# Patient Record
Sex: Male | Born: 1945 | Race: White | Hispanic: No | Marital: Married | Smoking: Current every day smoker
Health system: Southern US, Community
[De-identification: ages and names within clinical notes are randomized; demographics above are authoritative.]

## PROBLEM LIST (undated history)

## (undated) DIAGNOSIS — Z860100 Personal history of colon polyps, unspecified: Secondary | ICD-10-CM

## (undated) DIAGNOSIS — E785 Hyperlipidemia, unspecified: Secondary | ICD-10-CM

## (undated) DIAGNOSIS — Z87442 Personal history of urinary calculi: Secondary | ICD-10-CM

## (undated) DIAGNOSIS — G4700 Insomnia, unspecified: Secondary | ICD-10-CM

## (undated) DIAGNOSIS — G8929 Other chronic pain: Secondary | ICD-10-CM

## (undated) DIAGNOSIS — K219 Gastro-esophageal reflux disease without esophagitis: Secondary | ICD-10-CM

## (undated) DIAGNOSIS — K5790 Diverticulosis of intestine, part unspecified, without perforation or abscess without bleeding: Secondary | ICD-10-CM

## (undated) DIAGNOSIS — M2550 Pain in unspecified joint: Secondary | ICD-10-CM

## (undated) DIAGNOSIS — N200 Calculus of kidney: Secondary | ICD-10-CM

## (undated) DIAGNOSIS — F0390 Unspecified dementia without behavioral disturbance: Secondary | ICD-10-CM

## (undated) DIAGNOSIS — D649 Anemia, unspecified: Secondary | ICD-10-CM

## (undated) DIAGNOSIS — K2270 Barrett's esophagus without dysplasia: Secondary | ICD-10-CM

## (undated) DIAGNOSIS — Z8669 Personal history of other diseases of the nervous system and sense organs: Secondary | ICD-10-CM

## (undated) DIAGNOSIS — M170 Bilateral primary osteoarthritis of knee: Secondary | ICD-10-CM

## (undated) DIAGNOSIS — M549 Dorsalgia, unspecified: Secondary | ICD-10-CM

## (undated) DIAGNOSIS — R7303 Prediabetes: Secondary | ICD-10-CM

## (undated) DIAGNOSIS — R519 Headache, unspecified: Secondary | ICD-10-CM

## (undated) DIAGNOSIS — F419 Anxiety disorder, unspecified: Secondary | ICD-10-CM

## (undated) DIAGNOSIS — I10 Essential (primary) hypertension: Secondary | ICD-10-CM

## (undated) DIAGNOSIS — I639 Cerebral infarction, unspecified: Secondary | ICD-10-CM

## (undated) DIAGNOSIS — M1990 Unspecified osteoarthritis, unspecified site: Secondary | ICD-10-CM

## (undated) DIAGNOSIS — S92511A Displaced fracture of proximal phalanx of right lesser toe(s), initial encounter for closed fracture: Secondary | ICD-10-CM

## (undated) HISTORY — PX: COLONOSCOPY: SHX174

## (undated) HISTORY — PX: ESOPHAGOGASTRODUODENOSCOPY: SHX1529

---
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
## 1971-12-18 HISTORY — PX: VASECTOMY: SHX75

## 1991-12-18 HISTORY — PX: ARTHROSCOPIC REPAIR ACL: SUR80

## 1995-12-18 HISTORY — PX: ANKLE SURGERY: SHX546

## 2005-01-12 ENCOUNTER — Emergency Department: Payer: Self-pay | Admitting: Emergency Medicine

## 2005-06-13 ENCOUNTER — Ambulatory Visit: Payer: Self-pay

## 2005-07-20 ENCOUNTER — Ambulatory Visit: Payer: Self-pay | Admitting: Otolaryngology

## 2009-03-03 ENCOUNTER — Emergency Department: Payer: Self-pay | Admitting: Emergency Medicine

## 2009-03-06 ENCOUNTER — Emergency Department: Payer: Self-pay | Admitting: Emergency Medicine

## 2009-06-23 ENCOUNTER — Ambulatory Visit: Payer: Self-pay | Admitting: Urology

## 2010-02-10 ENCOUNTER — Ambulatory Visit: Payer: Self-pay | Admitting: Urology

## 2011-06-04 ENCOUNTER — Ambulatory Visit: Payer: Self-pay | Admitting: Gastroenterology

## 2011-06-06 LAB — PATHOLOGY REPORT

## 2011-07-30 ENCOUNTER — Ambulatory Visit: Payer: Self-pay | Admitting: Gastroenterology

## 2011-07-31 LAB — PATHOLOGY REPORT

## 2011-10-11 ENCOUNTER — Emergency Department: Payer: Self-pay | Admitting: Emergency Medicine

## 2013-03-28 ENCOUNTER — Inpatient Hospital Stay: Payer: Self-pay | Admitting: Surgery

## 2013-03-28 LAB — URINALYSIS, COMPLETE
Glucose,UR: NEGATIVE mg/dL (ref 0–75)
Nitrite: NEGATIVE
Ph: 5 (ref 4.5–8.0)
RBC,UR: 2 /HPF (ref 0–5)
Specific Gravity: 1.026 (ref 1.003–1.030)

## 2013-03-28 LAB — CBC
HCT: 40.7 % (ref 40.0–52.0)
MCH: 30.6 pg (ref 26.0–34.0)
MCV: 91 fL (ref 80–100)
RDW: 13.6 % (ref 11.5–14.5)

## 2013-03-28 LAB — COMPREHENSIVE METABOLIC PANEL
Albumin: 3.5 g/dL (ref 3.4–5.0)
Alkaline Phosphatase: 94 U/L (ref 50–136)
Bilirubin,Total: 0.7 mg/dL (ref 0.2–1.0)
Calcium, Total: 9.4 mg/dL (ref 8.5–10.1)
Chloride: 103 mmol/L (ref 98–107)
Co2: 26 mmol/L (ref 21–32)
Creatinine: 1.14 mg/dL (ref 0.60–1.30)
Glucose: 118 mg/dL — ABNORMAL HIGH (ref 65–99)
Osmolality: 275 (ref 275–301)
Potassium: 3.8 mmol/L (ref 3.5–5.1)
SGOT(AST): 13 U/L — ABNORMAL LOW (ref 15–37)
SGPT (ALT): 17 U/L (ref 12–78)
Sodium: 136 mmol/L (ref 136–145)
Total Protein: 6.8 g/dL (ref 6.4–8.2)

## 2013-03-29 LAB — CBC WITH DIFFERENTIAL/PLATELET
Basophil #: 0.2 10*3/uL — ABNORMAL HIGH (ref 0.0–0.1)
Basophil %: 1.2 %
Eosinophil #: 0.2 10*3/uL (ref 0.0–0.7)
Eosinophil %: 1.6 %
HCT: 35.9 % — ABNORMAL LOW (ref 40.0–52.0)
HGB: 12.1 g/dL — ABNORMAL LOW (ref 13.0–18.0)
Lymphocyte #: 3.5 10*3/uL (ref 1.0–3.6)
MCH: 30.7 pg (ref 26.0–34.0)
MCHC: 33.7 g/dL (ref 32.0–36.0)
Neutrophil %: 61.8 %
RBC: 3.95 10*6/uL — ABNORMAL LOW (ref 4.40–5.90)
WBC: 13 10*3/uL — ABNORMAL HIGH (ref 3.8–10.6)

## 2013-03-29 LAB — COMPREHENSIVE METABOLIC PANEL
Albumin: 3.1 g/dL — ABNORMAL LOW (ref 3.4–5.0)
Anion Gap: 5 — ABNORMAL LOW (ref 7–16)
BUN: 15 mg/dL (ref 7–18)
Calcium, Total: 8.7 mg/dL (ref 8.5–10.1)
Co2: 27 mmol/L (ref 21–32)
EGFR (African American): >60
EGFR (Non-African Amer.): >60
Potassium: 3.6 mmol/L (ref 3.5–5.1)
SGOT(AST): 12 U/L — ABNORMAL LOW (ref 15–37)
SGPT (ALT): 14 U/L (ref 12–78)

## 2013-03-30 LAB — CBC WITH DIFFERENTIAL/PLATELET
Eosinophil %: 2.7 %
HCT: 32.5 % — ABNORMAL LOW (ref 40.0–52.0)
HGB: 11.1 g/dL — ABNORMAL LOW (ref 13.0–18.0)
Lymphocyte %: 35.1 %
MCHC: 34.1 g/dL (ref 32.0–36.0)
Monocyte %: 9 %
Neutrophil %: 52.7 %
Platelet: 190 10*3/uL (ref 150–440)
RBC: 3.57 10*6/uL — ABNORMAL LOW (ref 4.40–5.90)
WBC: 8.2 10*3/uL (ref 3.8–10.6)

## 2013-04-30 ENCOUNTER — Ambulatory Visit: Payer: Self-pay | Admitting: Gastroenterology

## 2013-05-01 LAB — PATHOLOGY REPORT

## 2014-04-18 IMAGING — CT CT ABD-PELV W/ CM
1 of 2 series · 15 of 32 positions shown, 19 images · IV contrast (isovue)
Comparison: none

REASON FOR EXAM: (1) RLQ pain; (2) RLQ pain
COMMENTS:

PROCEDURE:     CT  - CT ABDOMEN / PELVIS  W  - March 28, 2013  [DATE]
RESULT:     Comparison:  02/10/2010
TECHNIQUE: Multiple axial images of the abdomen and pelvis were performed
from the lung bases to the pubic symphysis, without p.o. contrast and with
100 mL of Isovue 370 intravenous contrast.

[Series 2: 3mm soft tissue · axial · 0.75mm/px · z∈[-1261,-844]mm · 15 of 153 slices shown, 19 images]
[im 7/153  soft-tissue]
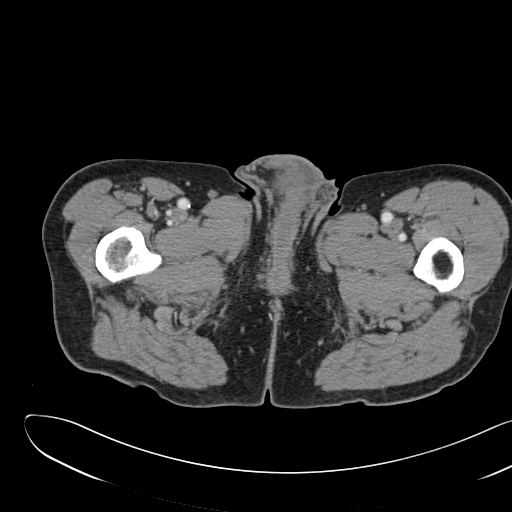
[im 7/153  bone]
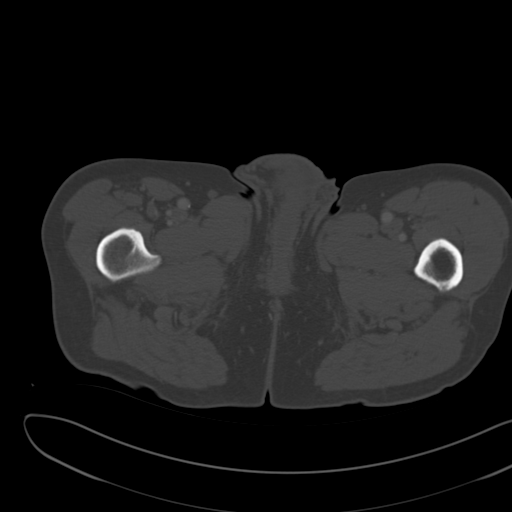
[im 19/153  soft-tissue]
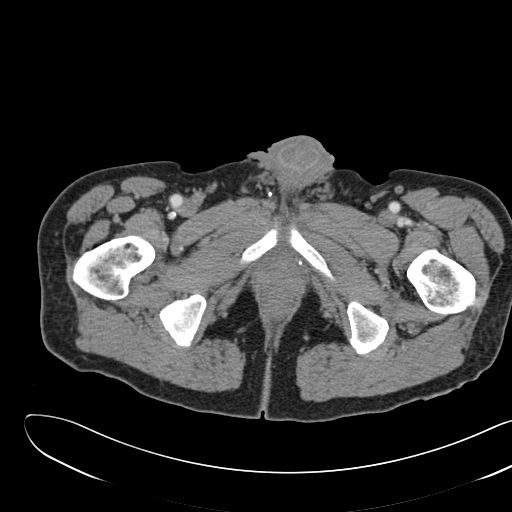
[im 31/153  soft-tissue]
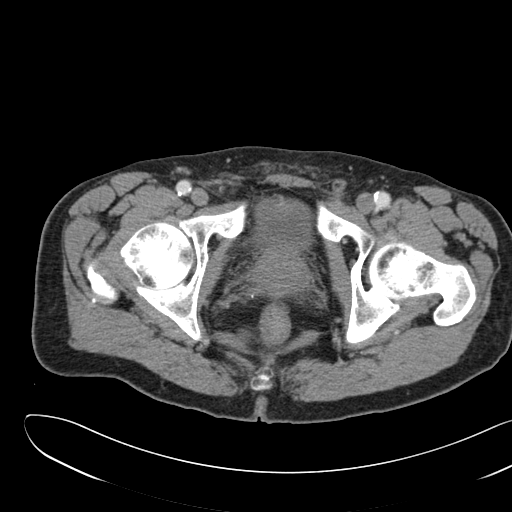
[im 43/153  soft-tissue]
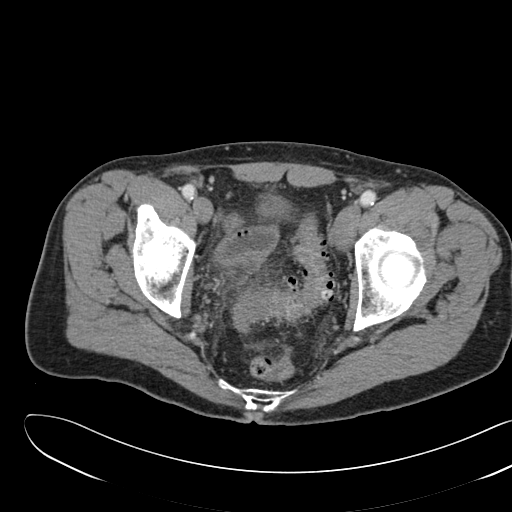
[im 55/153  soft-tissue]
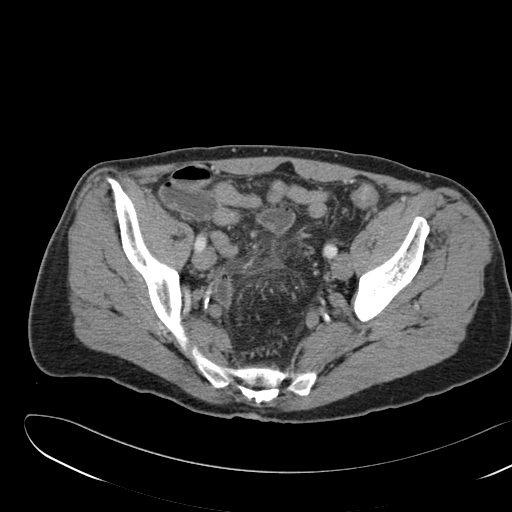
[im 67/153  soft-tissue]
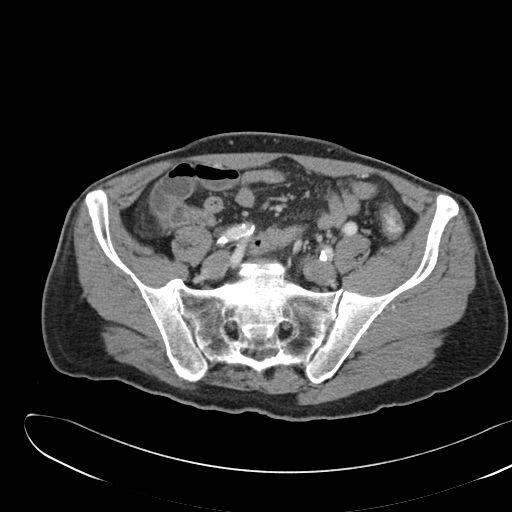
[im 80/153  soft-tissue]
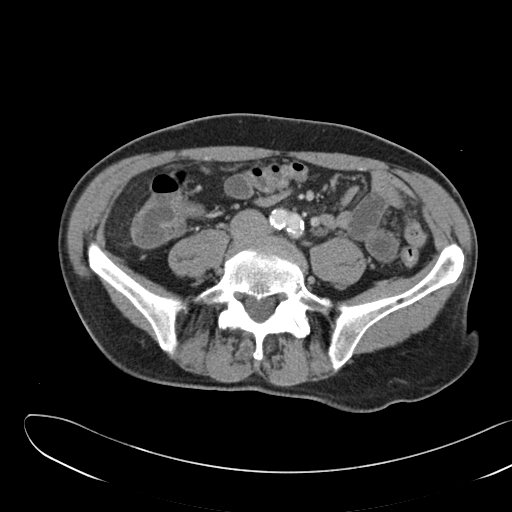
[im 86/153  soft-tissue]
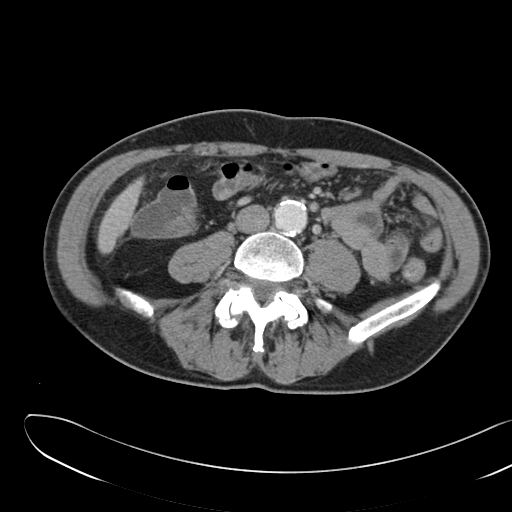
[im 98/153  soft-tissue]
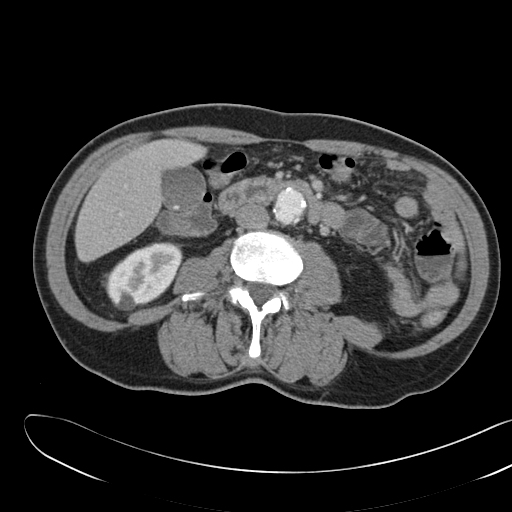
[im 98/153  bone]
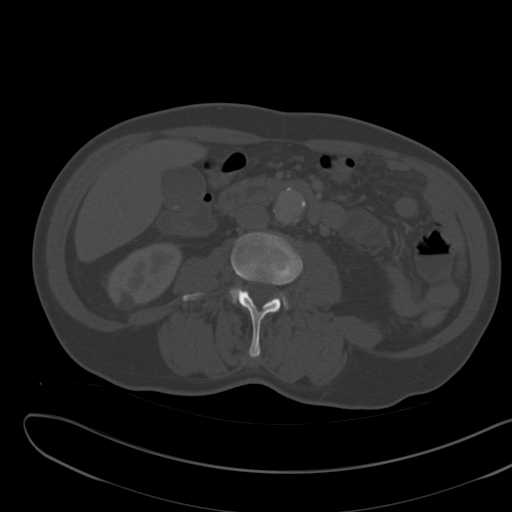
[im 110/153  soft-tissue]
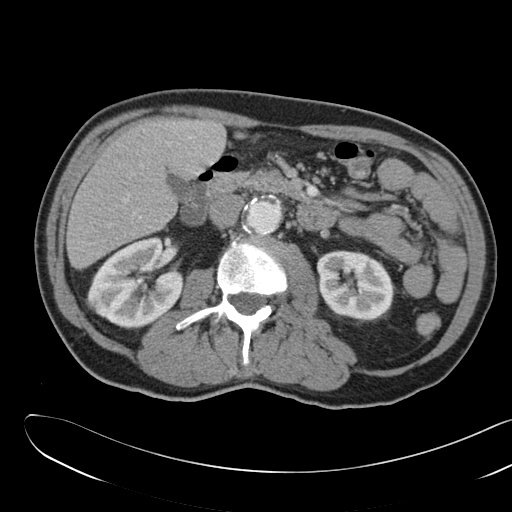
[im 122/153  soft-tissue]
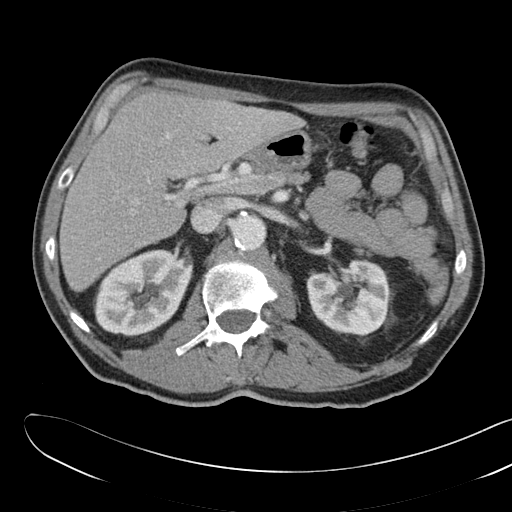
[im 128/153  lung]
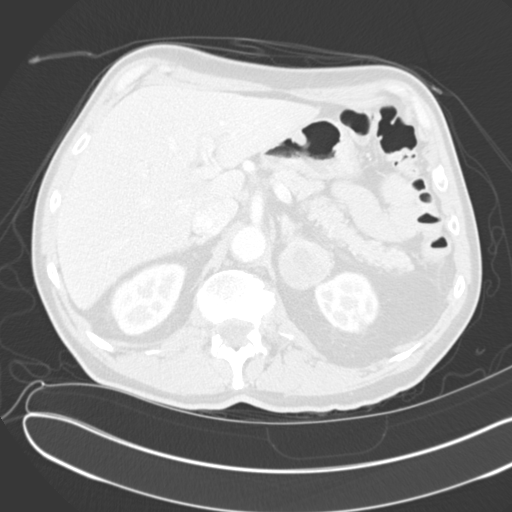
[im 134/153  soft-tissue]
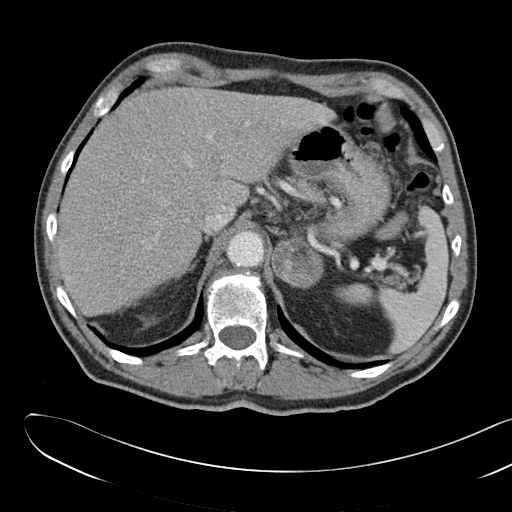
[im 134/153  lung]
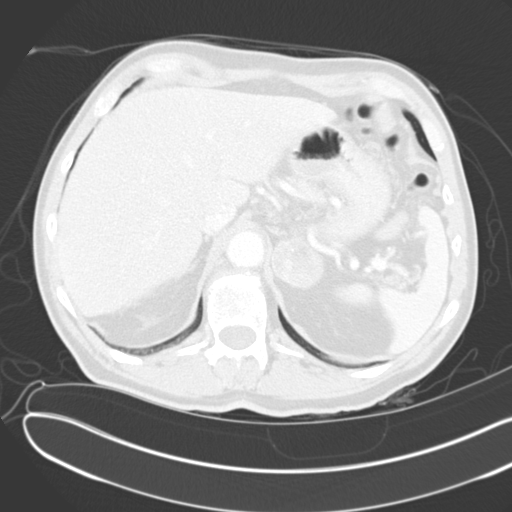
[im 140/153  lung]
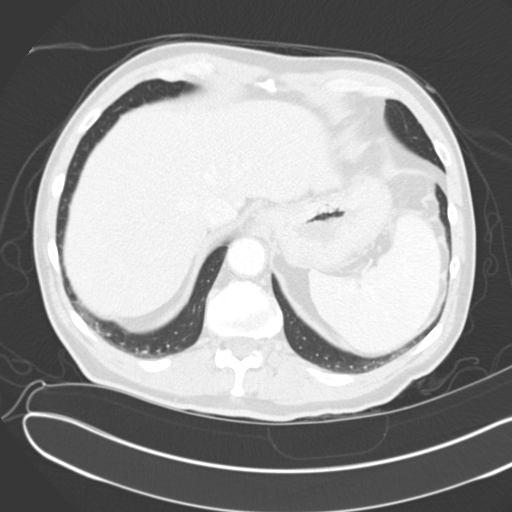
[im 146/153  soft-tissue]
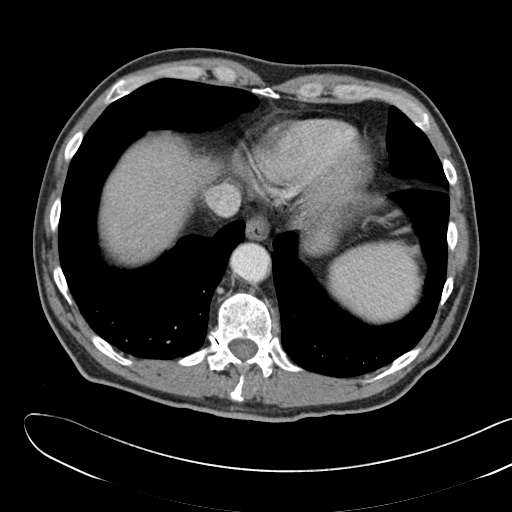
[im 146/153  lung]
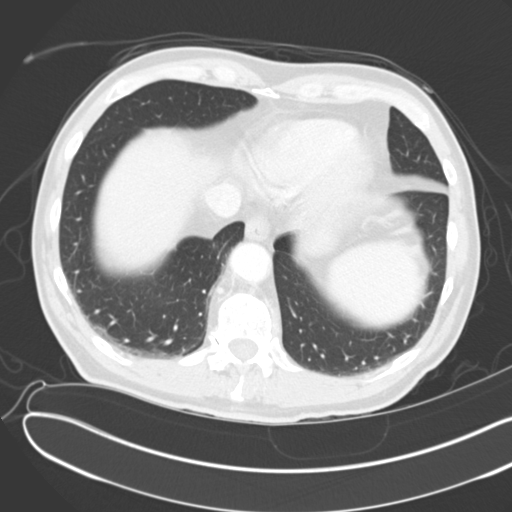

[15 of 32 positions shown; findings below may reference images not displayed]

FINDINGS: Mild basilar opacities are likely secondary to atelectasis.

The liver, spleen, and pancreas are unremarkable. Calcified stones are seen
within the gallbladder. Small low-attenuation foci in the kidneys are too
small to characterize, but likely represents cyst. There is a 4.1 cm mass in
the left adrenal gland which is similar to prior.

There is ectasia of the infrarenal abdominal aorta.

There is diverticulosis of the sigmoid colon. There is focal bowel wall
thickening and inflammatory change involving the inferior aspect of the
sigmoid colon. There are a few foci of adjacent extraluminal air which are
concerning for microperforation. No discrete extraluminal fluid collection
identified. There is diverticulosis of the descending colon. The appendix is
normal. There is mild prominence of the bladder wall, which is nonspecific.
Correlate for cystitis.

No aggressive lytic or sclerotic osseous lesions are identified.
IMPRESSION: Findings concerning for acute sigmoid diverticulitis. There are a few small
foci of extraluminal air concerning for microperforation. No discrete fluid
collection seen. Followup colonoscopy is suggested after the acute episode,
as malignancy can have a similar appearance.

## 2014-08-20 ENCOUNTER — Emergency Department: Payer: Self-pay | Admitting: Student

## 2014-08-20 LAB — COMPREHENSIVE METABOLIC PANEL
Albumin: 3.9 g/dL (ref 3.4–5.0)
Alkaline Phosphatase: 93 U/L
Anion Gap: 7 (ref 7–16)
BUN: 14 mg/dL (ref 7–18)
Bilirubin,Total: 0.2 mg/dL (ref 0.2–1.0)
Calcium, Total: 9.7 mg/dL (ref 8.5–10.1)
Chloride: 108 mmol/L — ABNORMAL HIGH (ref 98–107)
Co2: 24 mmol/L (ref 21–32)
Creatinine: 0.97 mg/dL (ref 0.60–1.30)
EGFR (African American): >60
EGFR (Non-African Amer.): >60
Glucose: 90 mg/dL (ref 65–99)
Osmolality: 278 (ref 275–301)
Potassium: 3.6 mmol/L (ref 3.5–5.1)
SGOT(AST): 13 U/L — ABNORMAL LOW (ref 15–37)
SGPT (ALT): 22 U/L
Sodium: 139 mmol/L (ref 136–145)
Total Protein: 7.1 g/dL (ref 6.4–8.2)

## 2014-08-20 LAB — URINALYSIS, COMPLETE
Bilirubin,UR: NEGATIVE
Glucose,UR: NEGATIVE mg/dL (ref 0–75)
Ketone: NEGATIVE
LEUKOCYTE ESTERASE: NEGATIVE
Nitrite: NEGATIVE
PH: 6 (ref 4.5–8.0)
PROTEIN: NEGATIVE
SPECIFIC GRAVITY: 1.004 (ref 1.003–1.030)
Squamous Epithelial: NONE SEEN
WBC UR: 1 /HPF (ref 0–5)

## 2014-08-20 LAB — CBC WITH DIFFERENTIAL/PLATELET
Eosinophil: 2 %
HCT: 41.7 % (ref 40.0–52.0)
HGB: 13.8 g/dL (ref 13.0–18.0)
Lymphocytes: 50 %
MCH: 30.7 pg (ref 26.0–34.0)
MCHC: 33 g/dL (ref 32.0–36.0)
MCV: 93 fL (ref 80–100)
MONOS PCT: 3 %
PLATELETS: 243 10*3/uL (ref 150–440)
RBC: 4.48 10*6/uL (ref 4.40–5.90)
RDW: 14 % (ref 11.5–14.5)
SEGMENTED NEUTROPHILS: 39 %
Variant Lymphocyte - H1-Rlymph: 6 %
WBC: 10 10*3/uL (ref 3.8–10.6)

## 2014-08-20 LAB — LIPASE, BLOOD: Lipase: 209 U/L (ref 73–393)

## 2014-08-31 ENCOUNTER — Emergency Department: Payer: Self-pay | Admitting: Emergency Medicine

## 2014-08-31 LAB — URINALYSIS, COMPLETE
BACTERIA: NONE SEEN
BILIRUBIN, UR: NEGATIVE
Glucose,UR: NEGATIVE mg/dL (ref 0–75)
Ketone: NEGATIVE
LEUKOCYTE ESTERASE: NEGATIVE
NITRITE: NEGATIVE
PROTEIN: NEGATIVE
Ph: 5 (ref 4.5–8.0)
RBC,UR: 2 /HPF (ref 0–5)
SQUAMOUS EPITHELIAL: NONE SEEN
Specific Gravity: 1.008 (ref 1.003–1.030)

## 2014-08-31 LAB — COMPREHENSIVE METABOLIC PANEL
ALBUMIN: 4 g/dL (ref 3.4–5.0)
ALK PHOS: 87 U/L
AST: 11 U/L — AB (ref 15–37)
Anion Gap: 3 — ABNORMAL LOW (ref 7–16)
BUN: 21 mg/dL — ABNORMAL HIGH (ref 7–18)
Bilirubin,Total: 0.4 mg/dL (ref 0.2–1.0)
CALCIUM: 9.4 mg/dL (ref 8.5–10.1)
CREATININE: 1.05 mg/dL (ref 0.60–1.30)
Chloride: 107 mmol/L (ref 98–107)
Co2: 29 mmol/L (ref 21–32)
Glucose: 107 mg/dL — ABNORMAL HIGH (ref 65–99)
Osmolality: 281 (ref 275–301)
POTASSIUM: 3.6 mmol/L (ref 3.5–5.1)
SGPT (ALT): 19 U/L
SODIUM: 139 mmol/L (ref 136–145)
TOTAL PROTEIN: 7.2 g/dL (ref 6.4–8.2)

## 2014-08-31 LAB — CBC
HCT: 41.2 % (ref 40.0–52.0)
HGB: 13.8 g/dL (ref 13.0–18.0)
MCH: 30.9 pg (ref 26.0–34.0)
MCHC: 33.4 g/dL (ref 32.0–36.0)
MCV: 93 fL (ref 80–100)
Platelet: 243 10*3/uL (ref 150–440)
RBC: 4.45 10*6/uL (ref 4.40–5.90)
RDW: 13.5 % (ref 11.5–14.5)
WBC: 9.7 10*3/uL (ref 3.8–10.6)

## 2014-09-05 ENCOUNTER — Emergency Department: Payer: Self-pay | Admitting: Internal Medicine

## 2014-09-05 LAB — COMPREHENSIVE METABOLIC PANEL
Albumin: 3.5 g/dL (ref 3.4–5.0)
Alkaline Phosphatase: 81 U/L
Anion Gap: 7 (ref 7–16)
BUN: 23 mg/dL — AB (ref 7–18)
Bilirubin,Total: 0.2 mg/dL (ref 0.2–1.0)
CO2: 26 mmol/L (ref 21–32)
Calcium, Total: 9.9 mg/dL (ref 8.5–10.1)
Chloride: 107 mmol/L (ref 98–107)
Creatinine: 1.08 mg/dL (ref 0.60–1.30)
EGFR (African American): >60
EGFR (Non-African Amer.): >60
GLUCOSE: 125 mg/dL — AB (ref 65–99)
Osmolality: 285 (ref 275–301)
POTASSIUM: 3.7 mmol/L (ref 3.5–5.1)
SGOT(AST): 10 U/L — ABNORMAL LOW (ref 15–37)
SGPT (ALT): 23 U/L
Sodium: 140 mmol/L (ref 136–145)
TOTAL PROTEIN: 6.4 g/dL (ref 6.4–8.2)

## 2014-09-05 LAB — CBC
HCT: 39 % — ABNORMAL LOW (ref 40.0–52.0)
HGB: 12.6 g/dL — ABNORMAL LOW (ref 13.0–18.0)
MCH: 30.2 pg (ref 26.0–34.0)
MCHC: 32.3 g/dL (ref 32.0–36.0)
MCV: 93 fL (ref 80–100)
Platelet: 236 10*3/uL (ref 150–440)
RBC: 4.18 10*6/uL — ABNORMAL LOW (ref 4.40–5.90)
RDW: 13.5 % (ref 11.5–14.5)
WBC: 10.6 10*3/uL (ref 3.8–10.6)

## 2014-09-05 LAB — URINALYSIS, COMPLETE
BACTERIA: NONE SEEN
Bilirubin,UR: NEGATIVE
Glucose,UR: NEGATIVE mg/dL (ref 0–75)
KETONE: NEGATIVE
LEUKOCYTE ESTERASE: NEGATIVE
NITRITE: NEGATIVE
PH: 6 (ref 4.5–8.0)
RBC,UR: 894 /HPF (ref 0–5)
Specific Gravity: 1.03 (ref 1.003–1.030)
Squamous Epithelial: NONE SEEN
WBC UR: 4 /HPF (ref 0–5)

## 2014-12-21 DIAGNOSIS — M9905 Segmental and somatic dysfunction of pelvic region: Secondary | ICD-10-CM | POA: Diagnosis not present

## 2014-12-21 DIAGNOSIS — M9903 Segmental and somatic dysfunction of lumbar region: Secondary | ICD-10-CM | POA: Diagnosis not present

## 2014-12-21 DIAGNOSIS — M955 Acquired deformity of pelvis: Secondary | ICD-10-CM | POA: Diagnosis not present

## 2014-12-21 DIAGNOSIS — M5431 Sciatica, right side: Secondary | ICD-10-CM | POA: Diagnosis not present

## 2014-12-28 DIAGNOSIS — M9905 Segmental and somatic dysfunction of pelvic region: Secondary | ICD-10-CM | POA: Diagnosis not present

## 2014-12-28 DIAGNOSIS — M5431 Sciatica, right side: Secondary | ICD-10-CM | POA: Diagnosis not present

## 2014-12-28 DIAGNOSIS — M955 Acquired deformity of pelvis: Secondary | ICD-10-CM | POA: Diagnosis not present

## 2014-12-28 DIAGNOSIS — M9903 Segmental and somatic dysfunction of lumbar region: Secondary | ICD-10-CM | POA: Diagnosis not present

## 2015-03-07 ENCOUNTER — Emergency Department: Payer: Self-pay | Admitting: Emergency Medicine

## 2015-03-07 DIAGNOSIS — Z720 Tobacco use: Secondary | ICD-10-CM | POA: Diagnosis not present

## 2015-03-07 DIAGNOSIS — R1032 Left lower quadrant pain: Secondary | ICD-10-CM | POA: Diagnosis not present

## 2015-03-07 DIAGNOSIS — K5730 Diverticulosis of large intestine without perforation or abscess without bleeding: Secondary | ICD-10-CM | POA: Diagnosis not present

## 2015-03-07 DIAGNOSIS — K8020 Calculus of gallbladder without cholecystitis without obstruction: Secondary | ICD-10-CM | POA: Diagnosis not present

## 2015-03-07 DIAGNOSIS — I714 Abdominal aortic aneurysm, without rupture: Secondary | ICD-10-CM | POA: Diagnosis not present

## 2015-03-31 DIAGNOSIS — M545 Low back pain: Secondary | ICD-10-CM | POA: Diagnosis not present

## 2015-04-08 NOTE — Consult Note (Signed)
PATIENT NAME:  Albert [name], [name] MR#:  [id number] DATE OF BIRTH:  [birth year]  DATE OF ADMISSION:  03/28/2013  CONSULTING PHYSICIAN: Cristal Deerhristopher A. Ulice Follett, M.D.   REASON FOR CONSULTATION: Right lower quadrant and suprapubic pain.   HISTORY OF PRESENT ILLNESS:  The patient is a pleasant 69 year old male with history of diverticulosis who presents with 4 days of right lower quadrant and suprapubic pain. He says that he has never had this pain before, he describes it as a constant pain, which has not improved over the last few days. It is also associated with poor appetite due to the fact that he has pain when he eats. Otherwise, he has been having normal bowel movements, but was recently constipated. No fevers, chills, night sweats, shortness of breath, cough, chest pain, dysuria or hematuria.   PAST MEDICAL HISTORY: 1.  Hypercholesterolemia.  2.  History of kidney stones.  3.  History of the knee surgery.  4.  History of ankle surgery.  5.  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  6.  Anxiety.   HOME MEDICATIONS:  Xanax 0.25, 1 tab p.o. t.i.d. p.r.n. anxiety, lovastatin 1 tab p.o. daily.   ALLERGIES: No known drug allergies.   SOCIAL HISTORY: Now smokes 3/4 pack of cigarettes a day x 40 years. Denies alcohol, lives in [place], is here with his wife.   FAMILY HISTORY: Esophageal cancer which father had.   Screening colonoscopy with Dr. Marva PandaSkulskie approximately 2 years ago which showed 2 polyps, which were excised completely as well as diverticulosis.  REVIEW OF SYSTEMS:  A 12-point review of systems was obtained. Pertinent positives and negatives as above.   PHYSICAL EXAMINATION: VITAL SIGNS: Temperature 98.4, pulse 78, blood pressure 130/76, respirations 18, 94% on room air.  GENERAL: No acute distress. Alert and oriented x 3.  HEAD: Normocephalic, atraumatic.  EYES: No scleral icterus. No conjunctivitis.  FACE:   No obvious face trauma. Normal external nose. Normal external ears.   HEART: Regular rate and rhythm. No murmurs, rubs or gallops.  CHEST: Lungs clear to auscultation, moving air well.  ABDOMEN: Soft, tender to palpation in suprapubic region. Nondistended.  EXTREMITIES: Moves all extremities well. Strength 5/5. NEUROLOGIC: Sensation intact to all four extremities.   LABORATORY AND DIAGNOSTIC DATA: Significant for white cell count of 16.4, hemoglobin 13.7, hematocrit 40.7. In 2014 LFTs are normal. Renal panel is normal. CT scan shows diverticulitis with some peridiverticular air, concerning for contained perforation.   ASSESSMENT AND PLAN: The patient is a pleasant 69 year old male with history of diverticulitis who now presents with diverticulitis.  He will be admitted intravenous antibiotics, made n.p.o. and resuscitated.  We will continue to follow him clinically.  No need for any surgical intervention at this time.    ____________________________ Si Raiderhristopher A. Omer Puccinelli, MD cal:ct D: 03/28/2013 13:30:51 ET T: 03/28/2013 13:53:51 ET JOB#: [id number]  cc: Cristal Deerhristopher A. Arlander Gillen, MD, <Dictator> Jarvis NewcomerHRISTOPHER A Randa Riss MD ELECTRONICALLY SIGNED 03/28/2013 17:13

## 2015-04-08 NOTE — Discharge Summary (Signed)
PATIENT NAME:  Albert [name], Albert [name] MR#:  [id number] DATE OF BIRTH:  July 10, 1946  DATE OF ADMISSION:  03/28/2013  DATE OF DISCHARGE:  03/30/2013  DISCHARGE DIAGNOSES: 1.  Acute diverticulitis.  2.  History of hypercholesterolemia.  3.  History of kidney stones.  4.  History of knee surgery.  5.  History of ankle surgery.  6.  History of fatty tumor of the neck.  7.  Anxiety.   DISCHARGE MEDICATIONS:  Are as follows:   1.  Lovastatin 1 tab [name].o. daily.  2.  Xanax 0.25, 1 tab [name].o. t.i.d. [name].r.n.  3.  Metronidazole 1 tab [name].o. q.8 hours.  4.  Ciprofloxacin 500 mg 1 tab [name].o. q.12 hours x 12 more days.   INDICATION FOR ADMISSION: The patient is a pleasant 69 year old with history of diverticulosis, who presented with abdominal pain, leukocytosis and a CT scan concerning for diverticulitis. He was admitted for initially n.[name].o. status and IV antibiotics.   HOSPITAL COURSE:  Is as follows:  The patient  was admitted for diverticulitis. He was made n.[name].o. and began on IV antibiotics. His pain and leukocytosis resolved quite quickly, and his diet was advanced from clear to regular diet, which he was tolerating at the time of discharge. His [name].o. pain meds were later transitioned from IV Cipro and Flagyl to [name].o. Cipro and Flagyl.   DISCHARGE INSTRUCTIONS: Are as follows:  The patient is to return to visit me in the office   in approximately 2 weeks. He is sent home with 12 days of antibiotics. He is to call or return to the ED if he has increased pain, nausea, vomiting or change in bowel habits.    ____________________________ Si Raiderhristopher A. Whitley Strycharz, MD cal:dmm D: 04/07/2013 22:48:40 ET T: 04/07/2013 23:01:21 ET JOB#: [id number]  cc: Cristal Deerhristopher A. Valen Mascaro, MD, <Dictator>

## 2015-04-08 NOTE — Discharge Summary (Signed)
PATIENT NAME:  Albert [name], [name] MR#:  [id number] DATE OF BIRTH:  05/30/1946  DATE OF ADMISSION:  03/28/2013 DATE OF DISCHARGE:  03/30/2013  DISCHARGE DIAGNOSES: 1.  Acute diverticulitis.  2.  History of hypercholesterolemia.  3.  History of kidney stones.  4.  History of knee surgery.  5.  History of ankle surgery.  6.  History of fatty tumor of the neck.  7.  Anxiety.   DISCHARGE MEDICATIONS:  Are as follows:   1.  Lovastatin 1 tab p.o. daily.  2.  Xanax 0.25, 1 tab p.o. t.i.d. p.r.n.  3.  Metronidazole 1 tab p.o. q.8 hours.  4.  Ciprofloxacin 500 mg 1 tab p.o. q.12 hours x 12 more days.   INDICATION FOR ADMISSION: The patient is a pleasant 69 year old with history of diverticulosis, who presented with abdominal pain, leukocytosis and a CT scan concerning for diverticulitis. He was admitted for initially n.p.o. status and IV antibiotics.   HOSPITAL COURSE:  Is as follows:  The patient  was admitted for diverticulitis. He was made n.p.o. and began on IV antibiotics. His pain and leukocytosis resolved quite quickly, and his diet was advanced from clear to regular diet, which he was tolerating at the time of discharge. His p.o. pain meds were later transitioned from IV Cipro and Flagyl to p.o. Cipro and Flagyl.   DISCHARGE INSTRUCTIONS: Are as follows:  The patient is to return to visit me in the office in approximately 2 weeks. He is sent home with 12 days of antibiotics. He is to call or return to the ED if he has increased pain, nausea, vomiting or change in bowel habits.    ____________________________ Si Raiderhristopher A. Valyn Latchford, MD cal:dmm D: 04/07/2013 22:48:00 ET T: 04/07/2013 23:01:21 ET JOB#: [id number]  cc: Cristal Deerhristopher A. Osiris Charles, MD, <Dictator> Jarvis NewcomerHRISTOPHER A Sandee Bernath MD ELECTRONICALLY SIGNED 04/10/2013 1:28

## 2015-06-28 ENCOUNTER — Telehealth: Payer: Self-pay

## 2015-06-28 ENCOUNTER — Other Ambulatory Visit: Payer: Self-pay | Admitting: Physician Assistant

## 2015-06-28 NOTE — Telephone Encounter (Signed)
Pharmacy request received from [facility] [address] requesting a 90 day supply for Alprazolam 0.25 mg- Take 1 tab TID PRN.

## 2015-06-29 NOTE — Telephone Encounter (Signed)
Patient is coming for appt tomorrow 7/14 at the occupational health office in [place].

## 2015-06-29 NOTE — Telephone Encounter (Signed)
Is this your patient? He is not in [facility] to abstract his chart.

## 2015-06-29 NOTE — Telephone Encounter (Signed)
He is a patient at [facility].  Please disregard.  [facility].  Thanks. Antony ContrasJenni

## 2015-07-26 ENCOUNTER — Encounter: Payer: Self-pay | Admitting: Emergency Medicine

## 2015-07-26 ENCOUNTER — Emergency Department
Admission: EM | Admit: 2015-07-26 | Discharge: 2015-07-26 | Disposition: A | Discharge status: Home / Self Care | Payer: Medicare Other | Attending: Emergency Medicine | Admitting: Emergency Medicine

## 2015-07-26 DIAGNOSIS — Z720 Tobacco use: Secondary | ICD-10-CM | POA: Diagnosis not present

## 2015-07-26 DIAGNOSIS — Z79899 Other long term (current) drug therapy: Secondary | ICD-10-CM | POA: Insufficient documentation

## 2015-07-26 DIAGNOSIS — M546 Pain in thoracic spine: Secondary | ICD-10-CM | POA: Diagnosis present

## 2015-07-26 DIAGNOSIS — M545 Low back pain: Secondary | ICD-10-CM | POA: Diagnosis not present

## 2015-07-26 HISTORY — DX: Unspecified osteoarthritis, unspecified site: M19.90

## 2015-07-26 HISTORY — DX: Gastro-esophageal reflux disease without esophagitis: K21.9

## 2015-07-26 HISTORY — DX: Dorsalgia, unspecified: M54.9

## 2015-07-26 MED ORDER — KETOROLAC TROMETHAMINE 60 MG/2ML IM SOLN
60.0000 mg | Freq: Once | INTRAMUSCULAR | Status: AC
  Administered 2015-07-26: 60 mg via INTRAMUSCULAR
  Filled 2015-07-26: qty 2

## 2015-07-26 MED ORDER — OXYCODONE-ACETAMINOPHEN 5-325 MG PO TABS: 1.0000 | ORAL_TABLET | Freq: Four times a day (QID) | ORAL | Status: DC | PRN

## 2015-07-26 MED ORDER — DIAZEPAM 2 MG PO TABS
2.0000 mg | ORAL_TABLET | Freq: Once | ORAL | Status: AC
  Administered 2015-07-26: 2 mg via ORAL
  Filled 2015-07-26: qty 1

## 2015-07-26 NOTE — ED Provider Notes (Signed)
[facility] Emergency Department Provider Note ____________________________________________  Time seen: Approximately 11:09 AM  I have reviewed the triage vital signs and the nursing notes.   HISTORY  Chief Complaint Back Pain   HPI Albert [name] is a 69 y.o. male who presents to the emergency department for evaluation of mid and lower back pain. He states that he feels the muscles suddenly tighten. 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 He states that this pain is similar to what he has experienced in the past but normally takes Tylenol with relief. Tylenol has not relieved the pain today. He is unable to take any type of NSAIDs due to esophageal erosions.   Past Medical History  Diagnosis Date  . GERD (gastroesophageal reflux disease)   . Back pain   . Arthritis   . Aortic aneurysm     There are no active problems to display for this patient.   No past surgical history on file.  Current Outpatient Rx  Name  Route  Sig  Dispense  Refill  . acetaminophen (TYLENOL) 325 MG tablet   Oral   Take 650 mg by mouth every 6 (six) hours as needed.         . ALPRAZolam (XANAX) 0.25 MG tablet   Oral   Take 0.25 mg by mouth 3 (three) times daily as needed for anxiety.         . lovastatin (MEVACOR) 20 MG tablet   Oral   Take 20 mg by mouth at bedtime.         Marland Kitchen oxyCODONE-acetaminophen (ROXICET) 5-325 MG per tablet   Oral   Take 1 tablet by mouth every 6 (six) hours as needed.   12 tablet   0     Allergies Codeine  No family history on file.  Social History History  Substance Use Topics  . Smoking status: Current Every Day Smoker  . Smokeless tobacco: Not on file  . Alcohol Use: No    Review of Systems Constitutional: No recent illness. Eyes: No visual changes. ENT: No sore throat. Cardiovascular: Denies chest pain or palpitations. Respiratory: Denies shortness of breath. Gastrointestinal: No  abdominal pain.  Genitourinary: Negative for dysuria. Musculoskeletal: Pain in the thoracic and lumbar back Skin: Negative for rash. Neurological: Negative for headaches, focal weakness or numbness. 10-point ROS otherwise negative.  ____________________________________________   PHYSICAL EXAM:  VITAL SIGNS: ED Triage Vitals  Enc Vitals Group     BP 07/26/15 1049 131/98 mmHg     Pulse Rate 07/26/15 1049 77     Resp 07/26/15 1049 16     Temp 07/26/15 1049 97.5 F (36.4 C)     Temp src --      SpO2 07/26/15 1049 97 %     Weight 07/26/15 1049 170 lb (77.111 kg)     Height 07/26/15 1049 6\' 3"  (1.905 m)     Head Cir --      Peak Flow --      Pain Score 07/26/15 1050 3     Pain Loc --      Pain Edu? --      Excl. in GC? --     Constitutional: Alert and oriented. Well appearing and in no acute distress. Eyes: Conjunctivae are normal. EOMI. Head: Atraumatic. Nose: No congestion/rhinnorhea. Neck: No stridor.  Respiratory: Normal respiratory effort.   Musculoskeletal: No midline tenderness upon palpation of thoracic and lumbar spine. Patient is  tender in the paraspinal areas without focal tenderness. Neurologic:  Normal speech and language. No gross focal neurologic deficits are appreciated. Speech is normal. No gait instability. Skin:  Skin is warm, dry and intact. Atraumatic. Psychiatric: Mood and affect are normal. Speech and behavior are normal.  ____________________________________________   LABS (all labs ordered are listed, but only abnormal results are displayed)  Labs Reviewed - No data to display ____________________________________________  RADIOLOGY  Not indicated ____________________________________________   PROCEDURES  Procedure(s) performed: None   ____________________________________________   INITIAL IMPRESSION / ASSESSMENT AND PLAN / ED COURSE  Pertinent labs & imaging results that were available during my care of the patient were reviewed by  me and considered in my medical decision making (see chart for details).  Patient denies new injury. He was given IM Toradol and by mouth Valium while in the emergency department with minimal relief. He was given a prescription for Roxicodone immediate release and advised to follow-up with his orthopedic doctor or primary care for pain that continues. He was advised to return to the emergency department for symptoms that change or worsen if he is unable schedule an appointment. ____________________________________________   FINAL CLINICAL IMPRESSION(S) / ED DIAGNOSES  Final diagnoses:  Bilateral thoracic back pain      Chinita Pester, FNP 07/26/15 1418  Chinita Pester, FNP 07/26/15 1419  Sharman Cheek, MD 07/26/15 [phone]

## 2015-07-26 NOTE — ED Notes (Signed)
Says pain in low back for 2 days that is severe.  Hx of arthritis, but he normally just take s tylenol for that and is relieved.

## 2015-07-26 NOTE — ED Notes (Signed)
States he has arthritis in back ,hips and knees..states pain is getting worse. Mainly in tailbone. denies any injury

## 2015-07-26 NOTE — Discharge Instructions (Signed)
Back Pain, Adult Low back pain is very common. About 1 in 5 people have back pain.The cause of low back pain is rarely dangerous. The pain often gets better over time.About half of people with a sudden onset of back pain feel better in just 2 weeks. About 8 in 10 people feel better by 6 weeks.  CAUSES Some common causes of back pain include:  Strain of the muscles or ligaments supporting the spine.  Wear and tear (degeneration) of the spinal discs.  Arthritis.  Direct injury to the back. DIAGNOSIS Most of the time, the direct cause of low back pain is not known.However, back pain can be treated effectively even when the exact cause of the pain is unknown.Answering your caregiver's questions about your overall health and symptoms is one of the most accurate ways to make sure the cause of your pain is not dangerous. If your caregiver needs more information, he or she may order lab work or imaging tests (X-rays or MRIs).However, even if imaging tests show changes in your back, this usually does not require surgery. HOME CARE INSTRUCTIONS For many people, back pain returns.Since low back pain is rarely dangerous, it is often a condition that people can learn to manageon their own.   Remain active. It is stressful on the back to sit or stand in one place. Do not sit, drive, or stand in one place for more than 30 minutes at a time. Take short walks on level surfaces as soon as pain allows.Try to increase the length of time you walk each day.  Do not stay in bed.Resting more than 1 or 2 days can delay your recovery.  Do not avoid exercise or work.Your body is made to move.It is not dangerous to be active, even though your back may hurt.Your back will likely heal faster if you return to being active before your pain is gone.  Pay attention to your body when you bend and lift. Many people have less discomfortwhen lifting if they bend their knees, keep the load close to their bodies,and  avoid twisting. Often, the most comfortable positions are those that put less stress on your recovering back.  Find a comfortable position to sleep. Use a firm mattress and lie on your side with your knees slightly bent. If you lie on your back, put a pillow under your knees.  Only take over-the-counter or prescription medicines as directed by your caregiver. Over-the-counter medicines to reduce pain and inflammation are often the most helpful.Your caregiver may prescribe muscle relaxant drugs.These medicines help dull your pain so you can more quickly return to your normal activities and healthy exercise.  Put ice on the injured area.  Put ice in a plastic bag.  Place a towel between your skin and the bag.  Leave the ice on for 15-20 minutes, 03-04 times a day for the first 2 to 3 days. After that, ice and heat may be alternated to reduce pain and spasms.  Ask your caregiver about trying back exercises and gentle massage. This may be of some benefit.  Avoid feeling anxious or stressed.Stress increases muscle tension and can worsen back pain.It is important to recognize when you are anxious or stressed and learn ways to manage it.Exercise is a great option. SEEK MEDICAL CARE IF:  You have pain that is not relieved with rest or medicine.  You have pain that does not improve in 1 week.  You have new symptoms.  You are generally not feeling well. SEEK   IMMEDIATE MEDICAL CARE IF:   You have pain that radiates from your back into your legs.  You develop new bowel or bladder control problems.  You have unusual weakness or numbness in your arms or legs.  You develop nausea or vomiting.  You develop abdominal pain.  You feel faint. Document Released: 12/03/2005 Document Revised: 06/03/2012 Document Reviewed: 04/06/2014 [employer] Patient Information 2015 [employer]. This information is not intended to replace advice given to you by your health care provider. Make sure you  discuss any questions you have with your health care provider.  

## 2015-09-21 IMAGING — US US RENAL KIDNEY
1 series · 13 of 25 positions shown · non-contrast
Comparison: CT abdomen and pelvis August 20, 2014

CLINICAL DATA: Recent ureteral calculus with hydronephrosis on the
right

EXAM:
RENAL/URINARY TRACT ULTRASOUND COMPLETE

[Series 1: us renal kidney · 0.24mm/px · 13 of 64 slices shown]
[im 1/64]
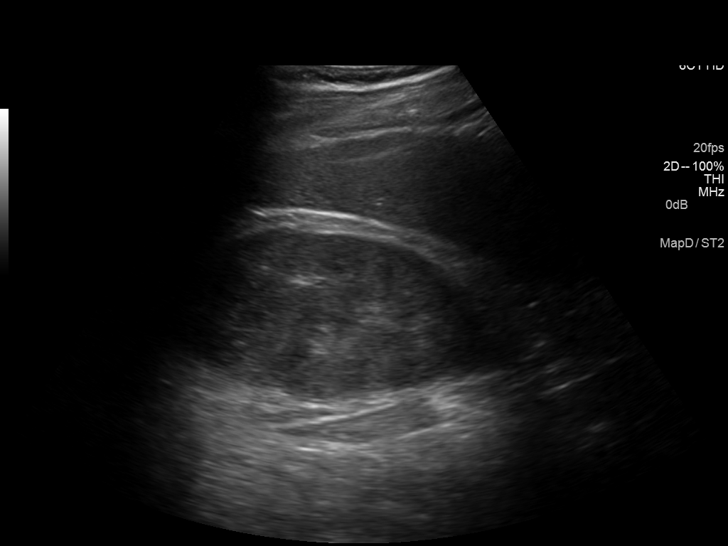
[im 6/64]
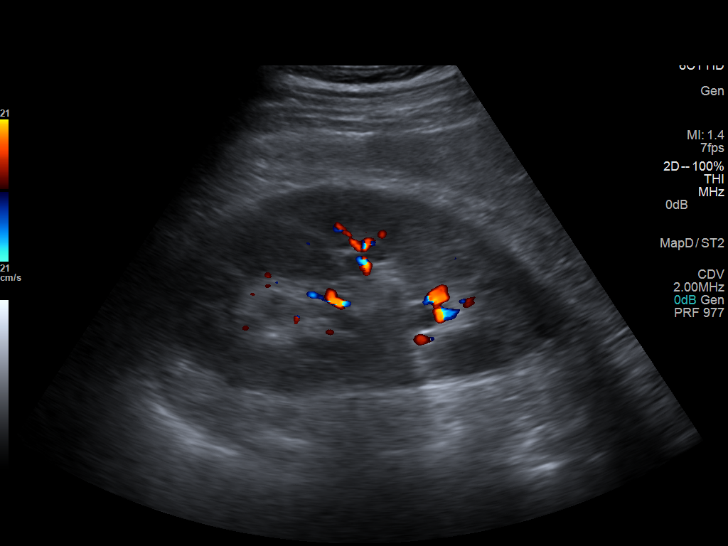
[im 11/64]
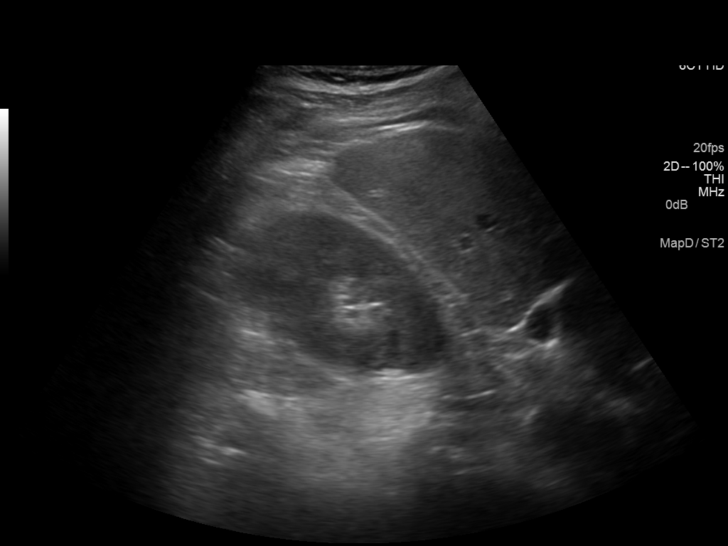
[im 16/64]
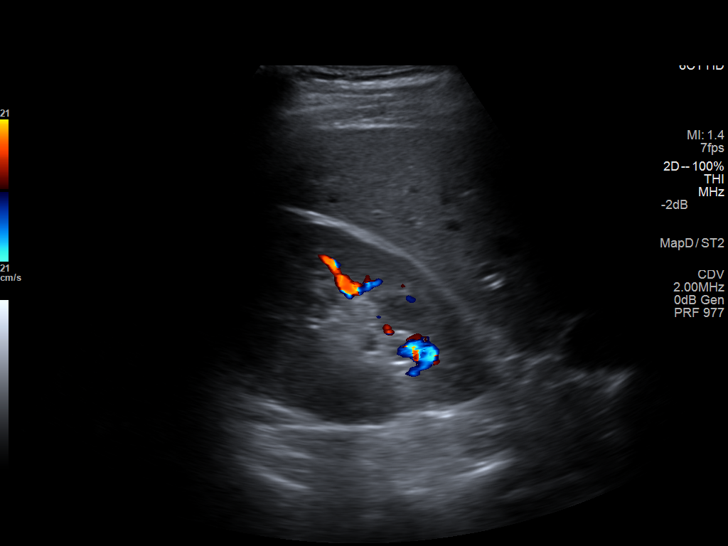
[im 22/64]
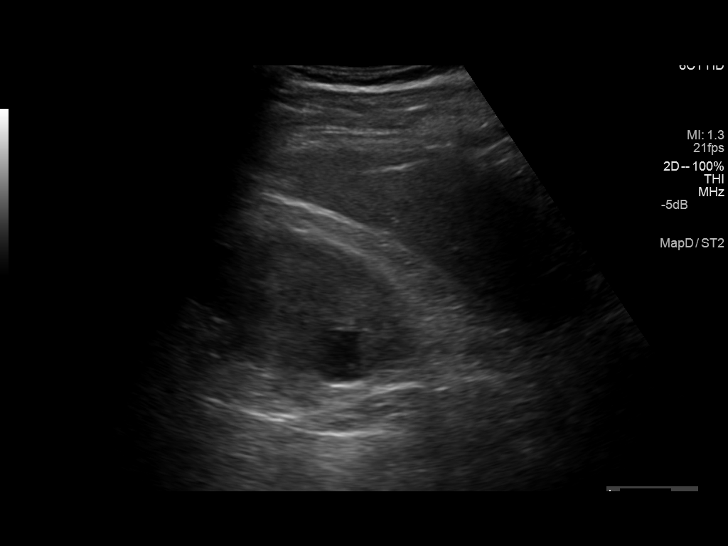
[im 27/64]
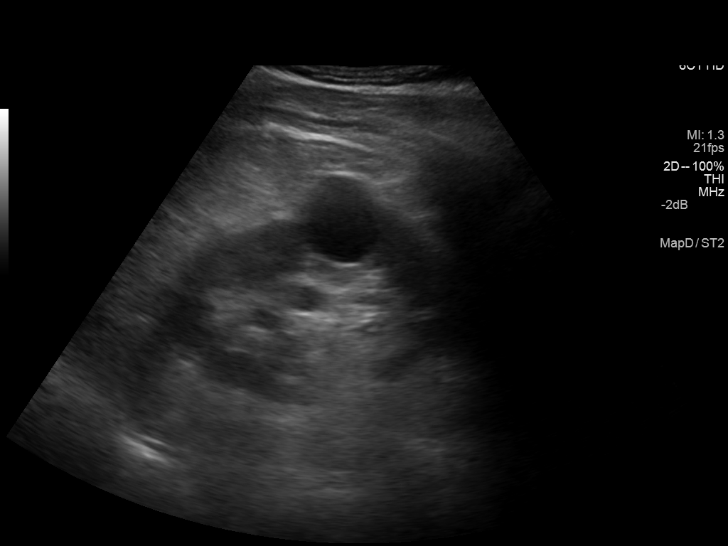
[im 32/64]
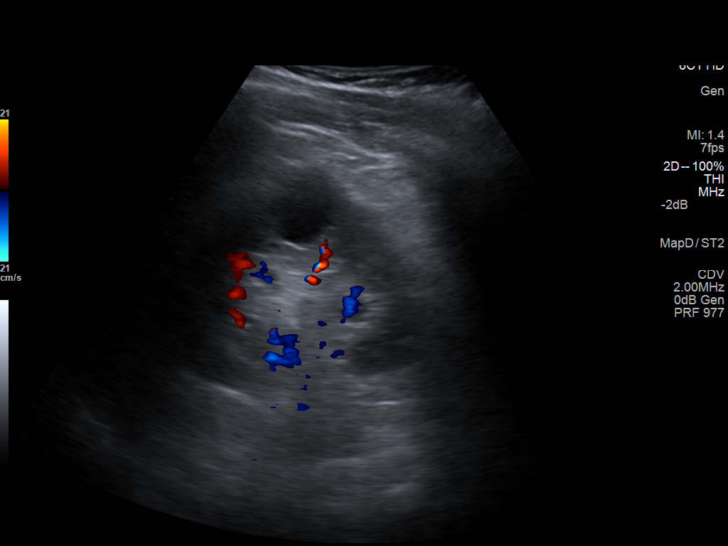
[im 37/64]
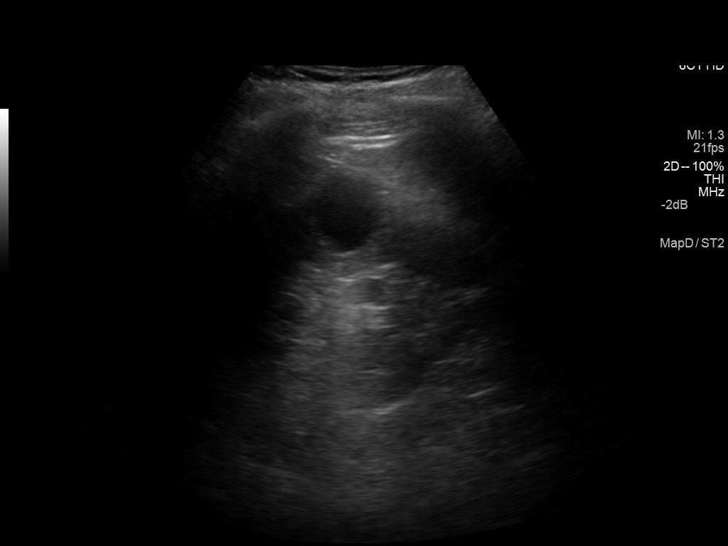
[im 43/64]
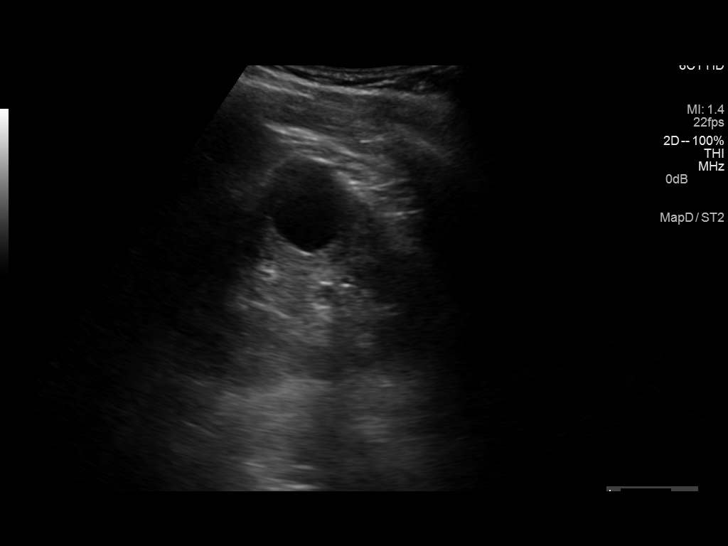
[im 48/64]
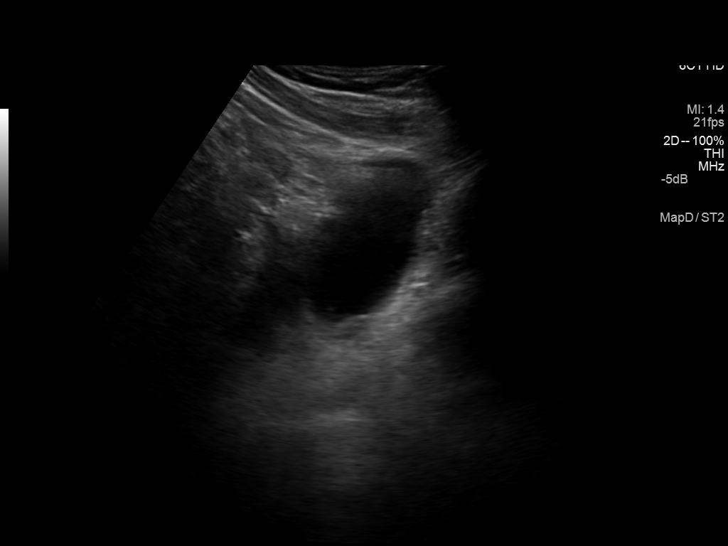
[im 53/64]
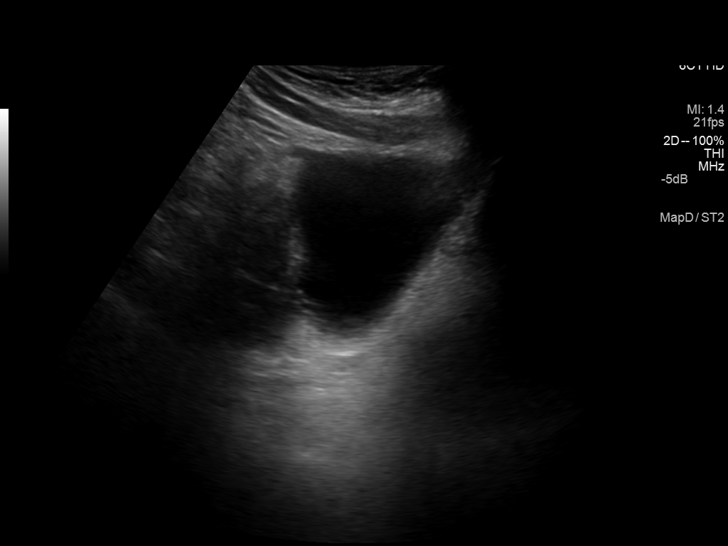
[im 58/64]
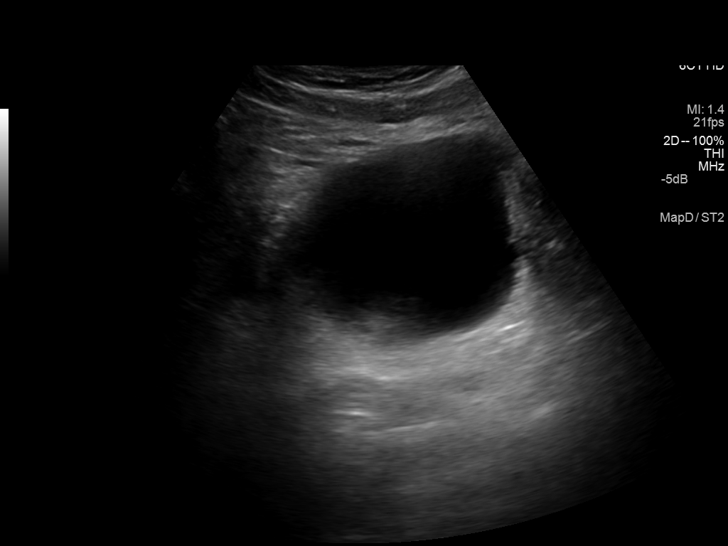
[im 64/64]
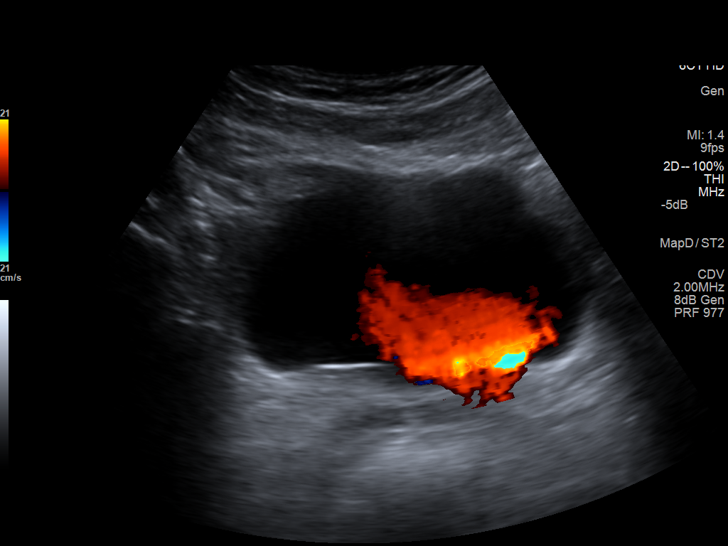

[13 of 25 positions shown; findings below may reference images not displayed]

FINDINGS: Right Kidney:

Length: 13.7 cm. Echogenicity and renal cortical thickness are
within normal limits. Mild pelvicaliectasis is noted on the right.
There is no perinephric fluid. There is a cyst arising from the
lower pole of the right kidney measuring 1.8 x 1.3 x 1.1 cm. No
other renal mass seen. No sonographically demonstrable calculus or
ureterectasis.

Left Kidney:

Length: 10.8 cm. Echogenicity and renal cortical thickness are
within normal limits. No pelvicaliectasis or perinephric fluid
identified. There is a cyst arising from the mid left kidney
measuring 2.8 x 2.0 x 2.4 cm. No other renal mass seen. No
sonographically demonstrable calculus or ureterectasis.

Bladder:

Appears normal for degree of bladder distention. Note that flow from
the left ureter is seen in the bladder. No flow is seen from the
right ureter into the bladder.
IMPRESSION: Mild pelvicaliectasis on the right. Absence of visualize flow from
the right ureter into the bladder. Question persistent obstruction
on the right from potential ureteral calculus.

There are small cysts in each kidney.

Study otherwise unremarkable.

## 2015-10-19 ENCOUNTER — Other Ambulatory Visit: Payer: Self-pay | Admitting: Rheumatology

## 2015-10-19 DIAGNOSIS — M545 Low back pain: Secondary | ICD-10-CM

## 2015-10-19 DIAGNOSIS — R531 Weakness: Secondary | ICD-10-CM

## 2015-12-12 ENCOUNTER — Other Ambulatory Visit: Payer: Self-pay | Admitting: Physician Assistant

## 2015-12-18 HISTORY — PX: BACK SURGERY: SHX140

## 2015-12-23 ENCOUNTER — Ambulatory Visit
Admission: RE | Admit: 2015-12-23 | Discharge: 2015-12-23 | Disposition: A | Discharge status: Home / Self Care | Payer: Medicare Other | Source: Ambulatory Visit | Attending: Rheumatology | Admitting: Rheumatology

## 2015-12-23 ENCOUNTER — Ambulatory Visit: Payer: Medicare Other

## 2015-12-23 DIAGNOSIS — R531 Weakness: Secondary | ICD-10-CM

## 2015-12-23 DIAGNOSIS — M545 Low back pain: Secondary | ICD-10-CM | POA: Diagnosis not present

## 2015-12-23 DIAGNOSIS — Q7649 Other congenital malformations of spine, not associated with scoliosis: Secondary | ICD-10-CM | POA: Diagnosis not present

## 2015-12-23 DIAGNOSIS — I714 Abdominal aortic aneurysm, without rupture: Secondary | ICD-10-CM | POA: Diagnosis not present

## 2016-01-24 ENCOUNTER — Other Ambulatory Visit (HOSPITAL_COMMUNITY): Payer: Self-pay | Admitting: Neurosurgery

## 2016-01-24 DIAGNOSIS — M4310 Spondylolisthesis, site unspecified: Secondary | ICD-10-CM

## 2016-02-09 ENCOUNTER — Ambulatory Visit
Admission: RE | Admit: 2016-02-09 | Discharge: 2016-02-09 | Disposition: A | Discharge status: Home / Self Care | Payer: Medicare Other | Source: Ambulatory Visit | Attending: Neurosurgery | Admitting: Neurosurgery

## 2016-02-09 DIAGNOSIS — M5126 Other intervertebral disc displacement, lumbar region: Secondary | ICD-10-CM | POA: Insufficient documentation

## 2016-02-09 DIAGNOSIS — M4310 Spondylolisthesis, site unspecified: Secondary | ICD-10-CM

## 2016-02-09 DIAGNOSIS — M4806 Spinal stenosis, lumbar region: Secondary | ICD-10-CM | POA: Insufficient documentation

## 2016-02-22 ENCOUNTER — Other Ambulatory Visit: Payer: Self-pay | Admitting: Neurosurgery

## 2016-03-07 ENCOUNTER — Encounter (HOSPITAL_COMMUNITY)
Admission: RE | Admit: 2016-03-07 | Discharge: 2016-03-07 | Disposition: A | Discharge status: Home / Self Care | Payer: Medicare Other | Source: Ambulatory Visit | Attending: Neurosurgery | Admitting: Neurosurgery

## 2016-03-07 ENCOUNTER — Encounter (HOSPITAL_COMMUNITY): Payer: Self-pay

## 2016-03-07 DIAGNOSIS — Z0183 Encounter for blood typing: Secondary | ICD-10-CM | POA: Diagnosis not present

## 2016-03-07 DIAGNOSIS — Z01812 Encounter for preprocedural laboratory examination: Secondary | ICD-10-CM | POA: Insufficient documentation

## 2016-03-07 DIAGNOSIS — M4310 Spondylolisthesis, site unspecified: Secondary | ICD-10-CM | POA: Diagnosis not present

## 2016-03-07 HISTORY — DX: Personal history of other diseases of the nervous system and sense organs: Z86.69

## 2016-03-07 HISTORY — DX: Personal history of urinary calculi: Z87.442

## 2016-03-07 HISTORY — DX: Personal history of colon polyps, unspecified: Z86.0100

## 2016-03-07 HISTORY — DX: Insomnia, unspecified: G47.00

## 2016-03-07 HISTORY — DX: Hyperlipidemia, unspecified: E78.5

## 2016-03-07 HISTORY — DX: Anxiety disorder, unspecified: F41.9

## 2016-03-07 HISTORY — DX: Dorsalgia, unspecified: M54.9

## 2016-03-07 HISTORY — DX: Pain in unspecified joint: M25.50

## 2016-03-07 HISTORY — DX: Other chronic pain: G89.29

## 2016-03-07 HISTORY — DX: Personal history of colonic polyps: Z86.010

## 2016-03-07 HISTORY — DX: Diverticulosis of intestine, part unspecified, without perforation or abscess without bleeding: K57.90

## 2016-03-07 LAB — CBC WITH DIFFERENTIAL/PLATELET
BASOS PCT: 1 %
Basophils Absolute: 0.1 10*3/uL (ref 0.0–0.1)
Eosinophils Absolute: 0.1 10*3/uL (ref 0.0–0.7)
Eosinophils Relative: 2 %
HEMATOCRIT: 40.9 % (ref 39.0–52.0)
Hemoglobin: 13.5 g/dL (ref 13.0–17.0)
LYMPHS ABS: 3.5 10*3/uL (ref 0.7–4.0)
Lymphocytes Relative: 47 %
MCH: 30.9 pg (ref 26.0–34.0)
MCHC: 33 g/dL (ref 30.0–36.0)
MCV: 93.6 fL (ref 78.0–100.0)
MONO ABS: 0.4 10*3/uL (ref 0.1–1.0)
MONOS PCT: 5 %
NEUTROS ABS: 3.3 10*3/uL (ref 1.7–7.7)
Neutrophils Relative %: 45 %
Platelets: 219 10*3/uL (ref 150–400)
RBC: 4.37 MIL/uL (ref 4.22–5.81)
RDW: 13.5 % (ref 11.5–15.5)
WBC: 7.3 10*3/uL (ref 4.0–10.5)

## 2016-03-07 LAB — BASIC METABOLIC PANEL
Anion gap: 9 (ref 5–15)
BUN: 14 mg/dL (ref 6–20)
CALCIUM: 9.8 mg/dL (ref 8.9–10.3)
CHLORIDE: 107 mmol/L (ref 101–111)
CO2: 27 mmol/L (ref 22–32)
CREATININE: 0.83 mg/dL (ref 0.61–1.24)
Glucose, Bld: 101 mg/dL — ABNORMAL HIGH (ref 65–99)
Potassium: 4.1 mmol/L (ref 3.5–5.1)
SODIUM: 143 mmol/L (ref 135–145)

## 2016-03-07 LAB — TYPE AND SCREEN
ABO/RH(D): A POS
ANTIBODY SCREEN: NEGATIVE

## 2016-03-07 LAB — SURGICAL PCR SCREEN
MRSA, PCR: NEGATIVE
Staphylococcus aureus: POSITIVE — AB

## 2016-03-07 LAB — ABO/RH: ABO/RH(D): A POS

## 2016-03-07 NOTE — Pre-Procedure Instructions (Signed)
Albert [name]  03/07/2016      [id number] [place], [place] - [address] [place] [place] [address] Phone: [phone] Fax: [phone]    Your procedure is scheduled on Tues, Mar 28 @ 11:35 AM  [facility] Admitting at 9:30 AM  Call this number if you have problems the morning of surgery:  [phone]   Remember:  Do not eat food or drink liquids after midnight.  Take these medicines the morning of surgery with A SIP OF WATER Alprazolam(Xanax)             No Goody's,BC's,Aleve,Aspirin,Ibuprofen,Advil,Motrin,Fish Oil,or any Herbal Medications.    Do not wear jewelry.  Do not wear lotions, powders, or colognes.  You may wear deodorant.  Men may shave face and neck.  Do not bring valuables to the hospital.  [facility] is not responsible for any belongings or valuables.  Contacts, dentures or bridgework may not be worn into surgery.  Leave your suitcase in the car.  After surgery it may be brought to your room.  For patients admitted to the hospital, discharge time will be determined by your treatment team.  Patients discharged the day of surgery will not be allowed to drive home.    Special instructions:  [facility] - Preparing for Surgery  Before surgery, you can play an important role.  Because skin is not sterile, your skin needs to be as free of germs as possible.  You can reduce the number of germs on you skin by washing with CHG (chlorahexidine gluconate) soap before surgery.  CHG is an antiseptic cleaner which kills germs and bonds with the skin to continue killing germs even after washing.  Please DO NOT use if you have an allergy to CHG or antibacterial soaps.  If your skin becomes reddened/irritated stop using the CHG and inform your nurse when you arrive at Short Stay.  Do not shave (including legs and underarms) for at least 48 hours prior to the first CHG shower.  You may shave your face.  Please follow  these instructions carefully:   1.  Shower with CHG Soap the night before surgery and the                                morning of Surgery.  2.  If you choose to wash your hair, wash your hair first as usual with your       normal shampoo.  3.  After you shampoo, rinse your hair and body thoroughly to remove the                      Shampoo.  4.  Use CHG as you would any other liquid soap.  You can apply chg directly       to the skin and wash gently with scrungie or a clean washcloth.  5.  Apply the CHG Soap to your body ONLY FROM THE NECK DOWN.        Do not use on open wounds or open sores.  Avoid contact with your eyes,       ears, mouth and genitals (private parts).  Wash genitals (private parts)       with your normal soap.  6.  Wash thoroughly, paying special attention to the area where your surgery  will be performed.  7.  Thoroughly rinse your body with warm water from the neck down.  8.  DO NOT shower/wash with your normal soap after using and rinsing off       the CHG Soap.  9.  Pat yourself dry with a clean towel.            10.  Wear clean pajamas.            11.  Place clean sheets on your bed the night of your first shower and do not        sleep with pets.  Day of Surgery  Do not apply any lotions/deoderants the morning of surgery.  Please wear clean clothes to the hospital/surgery center.    Please read over the following fact sheets that you were given. Pain Booklet, Coughing and Deep Breathing, Blood Transfusion Information, MRSA Information and Surgical Site Infection Prevention

## 2016-03-07 NOTE — Progress Notes (Addendum)
Cardiologist denies  Medical Md is Dr.Joseph Ellin GoodieRabinowitz  Echo denies  Stress test denies  Heart cath denies  E[facility] done in April 2016-[facility]  CXR denies in past yr  Sleep study denies

## 2016-03-07 NOTE — Progress Notes (Signed)
[facility] [address] [place]

## 2016-03-12 MED ORDER — CEFAZOLIN SODIUM-DEXTROSE 2-4 GM/100ML-% IV SOLN
2.0000 g | INTRAVENOUS | Status: AC
  Administered 2016-03-13: 2 g via INTRAVENOUS
  Filled 2016-03-12: qty 100

## 2016-03-12 MED ORDER — DEXAMETHASONE SODIUM PHOSPHATE 10 MG/ML IJ SOLN
10.0000 mg | INTRAMUSCULAR | Status: AC
  Administered 2016-03-13: 10 mg via INTRAVENOUS
  Filled 2016-03-12: qty 1

## 2016-03-13 ENCOUNTER — Inpatient Hospital Stay (HOSPITAL_COMMUNITY): Payer: Medicare Other

## 2016-03-13 ENCOUNTER — Inpatient Hospital Stay (HOSPITAL_COMMUNITY): Payer: Medicare Other | Admitting: Anesthesiology

## 2016-03-13 ENCOUNTER — Inpatient Hospital Stay (HOSPITAL_COMMUNITY)
Admission: RE | Admit: 2016-03-13 | Discharge: 2016-03-14 | DRG: 460 | Disposition: A | Discharge status: Home / Self Care | Payer: Medicare Other | Source: Ambulatory Visit | Attending: Neurosurgery | Admitting: Neurosurgery

## 2016-03-13 ENCOUNTER — Encounter (HOSPITAL_COMMUNITY)
Admission: RE | Disposition: A | Discharge status: Home / Self Care | Payer: Self-pay | Source: Ambulatory Visit | Attending: Neurosurgery

## 2016-03-13 ENCOUNTER — Encounter (HOSPITAL_COMMUNITY): Payer: Self-pay | Admitting: *Deleted

## 2016-03-13 DIAGNOSIS — M4806 Spinal stenosis, lumbar region: Secondary | ICD-10-CM | POA: Diagnosis present

## 2016-03-13 DIAGNOSIS — F17200 Nicotine dependence, unspecified, uncomplicated: Secondary | ICD-10-CM | POA: Diagnosis present

## 2016-03-13 DIAGNOSIS — M4310 Spondylolisthesis, site unspecified: Secondary | ICD-10-CM | POA: Diagnosis present

## 2016-03-13 DIAGNOSIS — M4316 Spondylolisthesis, lumbar region: Secondary | ICD-10-CM | POA: Diagnosis present

## 2016-03-13 DIAGNOSIS — Z419 Encounter for procedure for purposes other than remedying health state, unspecified: Secondary | ICD-10-CM

## 2016-03-13 DIAGNOSIS — M549 Dorsalgia, unspecified: Secondary | ICD-10-CM | POA: Diagnosis present

## 2016-03-13 HISTORY — PX: LUMBAR FUSION: SHX111

## 2016-03-13 SURGERY — POSTERIOR LUMBAR FUSION 1 LEVEL
Anesthesia: General | Site: Back

## 2016-03-13 MED ORDER — PROPOFOL 10 MG/ML IV BOLUS
INTRAVENOUS | Status: AC
  Filled 2016-03-13: qty 20

## 2016-03-13 MED ORDER — SUMATRIPTAN SUCCINATE 50 MG PO TABS
50.0000 mg | ORAL_TABLET | ORAL | Status: DC | PRN
  Filled 2016-03-13: qty 1

## 2016-03-13 MED ORDER — ACETAMINOPHEN 650 MG RE SUPP: 650.0000 mg | RECTAL | Status: DC | PRN

## 2016-03-13 MED ORDER — ROCURONIUM BROMIDE 50 MG/5ML IV SOLN
INTRAVENOUS | Status: AC
  Filled 2016-03-13: qty 2

## 2016-03-13 MED ORDER — ONDANSETRON HCL 4 MG/2ML IJ SOLN
INTRAMUSCULAR | Status: AC
  Filled 2016-03-13: qty 2

## 2016-03-13 MED ORDER — GLYCOPYRROLATE 0.2 MG/ML IJ SOLN
INTRAMUSCULAR | Status: DC | PRN
  Administered 2016-03-13: 0.6 mg via INTRAVENOUS

## 2016-03-13 MED ORDER — ROCURONIUM BROMIDE 100 MG/10ML IV SOLN
INTRAVENOUS | Status: DC | PRN
  Administered 2016-03-13: 50 mg via INTRAVENOUS

## 2016-03-13 MED ORDER — SODIUM CHLORIDE 0.9 % IR SOLN
Status: DC | PRN
  Administered 2016-03-13: 13:00:00

## 2016-03-13 MED ORDER — ALPRAZOLAM 0.25 MG PO TABS: 0.2500 mg | ORAL_TABLET | Freq: Three times a day (TID) | ORAL | Status: DC | PRN

## 2016-03-13 MED ORDER — ARTIFICIAL TEARS OP OINT
TOPICAL_OINTMENT | OPHTHALMIC | Status: AC
  Filled 2016-03-13: qty 3.5

## 2016-03-13 MED ORDER — STERILE WATER FOR INJECTION IJ SOLN
INTRAMUSCULAR | Status: AC
  Filled 2016-03-13: qty 10

## 2016-03-13 MED ORDER — EPHEDRINE SULFATE 50 MG/ML IJ SOLN
INTRAMUSCULAR | Status: DC | PRN
  Administered 2016-03-13 (×4): 5 mg via INTRAVENOUS

## 2016-03-13 MED ORDER — THROMBIN 20000 UNITS EX SOLR
CUTANEOUS | Status: DC | PRN
  Administered 2016-03-13: 13:00:00 via TOPICAL

## 2016-03-13 MED ORDER — ONDANSETRON HCL 4 MG/2ML IJ SOLN
INTRAMUSCULAR | Status: DC | PRN
  Administered 2016-03-13: 4 mg via INTRAVENOUS

## 2016-03-13 MED ORDER — OXYCODONE-ACETAMINOPHEN 5-325 MG PO TABS: 1.0000 | ORAL_TABLET | ORAL | Status: DC | PRN

## 2016-03-13 MED ORDER — HYDROMORPHONE HCL 1 MG/ML IJ SOLN: 0.5000 mg | INTRAMUSCULAR | Status: DC | PRN

## 2016-03-13 MED ORDER — PHENOL 1.4 % MT LIQD
1.0000 | OROMUCOSAL | Status: DC | PRN
Start: 2016-03-13 — End: 2016-03-14

## 2016-03-13 MED ORDER — NEOSTIGMINE METHYLSULFATE 10 MG/10ML IV SOLN
INTRAVENOUS | Status: AC
  Filled 2016-03-13: qty 5

## 2016-03-13 MED ORDER — MIDAZOLAM HCL 2 MG/2ML IJ SOLN
INTRAMUSCULAR | Status: AC
  Filled 2016-03-13: qty 2

## 2016-03-13 MED ORDER — SODIUM CHLORIDE 0.9 % IJ SOLN
INTRAMUSCULAR | Status: AC
  Filled 2016-03-13: qty 10

## 2016-03-13 MED ORDER — BUPIVACAINE HCL (PF) 0.25 % IJ SOLN
INTRAMUSCULAR | Status: DC | PRN
  Administered 2016-03-13: 30 mL

## 2016-03-13 MED ORDER — NEOSTIGMINE METHYLSULFATE 10 MG/10ML IV SOLN
INTRAVENOUS | Status: DC | PRN
  Administered 2016-03-13: 4 mg via INTRAVENOUS

## 2016-03-13 MED ORDER — VANCOMYCIN HCL 1000 MG IV SOLR
INTRAVENOUS | Status: DC | PRN
  Administered 2016-03-13: 1000 mg via TOPICAL

## 2016-03-13 MED ORDER — 0.9 % SODIUM CHLORIDE (POUR BTL) OPTIME
TOPICAL | Status: DC | PRN
  Administered 2016-03-13: 1000 mL

## 2016-03-13 MED ORDER — GLYCOPYRROLATE 0.2 MG/ML IJ SOLN
INTRAMUSCULAR | Status: AC
  Filled 2016-03-13: qty 2

## 2016-03-13 MED ORDER — EPHEDRINE SULFATE 50 MG/ML IJ SOLN
INTRAMUSCULAR | Status: AC
  Filled 2016-03-13: qty 1

## 2016-03-13 MED ORDER — LACTATED RINGERS IV SOLN
INTRAVENOUS | Status: DC
  Administered 2016-03-13 (×3): via INTRAVENOUS

## 2016-03-13 MED ORDER — PROPOFOL 10 MG/ML IV BOLUS
INTRAVENOUS | Status: DC | PRN
  Administered 2016-03-13: 150 mg via INTRAVENOUS

## 2016-03-13 MED ORDER — SODIUM CHLORIDE 0.9% FLUSH: 3.0000 mL | INTRAVENOUS | Status: DC | PRN

## 2016-03-13 MED ORDER — SODIUM CHLORIDE 0.9% FLUSH
3.0000 mL | Freq: Two times a day (BID) | INTRAVENOUS | Status: DC
  Administered 2016-03-13: 3 mL via INTRAVENOUS

## 2016-03-13 MED ORDER — LIDOCAINE HCL (CARDIAC) 20 MG/ML IV SOLN
INTRAVENOUS | Status: DC | PRN
  Administered 2016-03-13: 100 mg via INTRAVENOUS

## 2016-03-13 MED ORDER — VECURONIUM BROMIDE 10 MG IV SOLR
INTRAVENOUS | Status: AC
  Filled 2016-03-13: qty 10

## 2016-03-13 MED ORDER — MENTHOL 3 MG MT LOZG
1.0000 | LOZENGE | OROMUCOSAL | Status: DC | PRN
  Filled 2016-03-13: qty 9

## 2016-03-13 MED ORDER — DIAZEPAM 5 MG PO TABS
5.0000 mg | ORAL_TABLET | Freq: Four times a day (QID) | ORAL | Status: DC | PRN
Start: 2016-03-13 — End: 2016-03-14
  Administered 2016-03-13 – 2016-03-14 (×2): 5 mg via ORAL
  Filled 2016-03-13 (×2): qty 1

## 2016-03-13 MED ORDER — LIDOCAINE HCL (CARDIAC) 20 MG/ML IV SOLN
INTRAVENOUS | Status: AC
  Filled 2016-03-13: qty 10

## 2016-03-13 MED ORDER — VECURONIUM BROMIDE 10 MG IV SOLR
INTRAVENOUS | Status: DC | PRN
  Administered 2016-03-13 (×2): 2 mg via INTRAVENOUS

## 2016-03-13 MED ORDER — FENTANYL CITRATE (PF) 250 MCG/5ML IJ SOLN
INTRAMUSCULAR | Status: AC
  Filled 2016-03-13: qty 5

## 2016-03-13 MED ORDER — HYDROMORPHONE HCL 1 MG/ML IJ SOLN: 0.2500 mg | INTRAMUSCULAR | Status: DC | PRN

## 2016-03-13 MED ORDER — MIDAZOLAM HCL 5 MG/5ML IJ SOLN
INTRAMUSCULAR | Status: DC | PRN
  Administered 2016-03-13: 2 mg via INTRAVENOUS

## 2016-03-13 MED ORDER — ONDANSETRON HCL 4 MG/2ML IJ SOLN: 4.0000 mg | INTRAMUSCULAR | Status: DC | PRN

## 2016-03-13 MED ORDER — ACETAMINOPHEN 325 MG PO TABS: 650.0000 mg | ORAL_TABLET | ORAL | Status: DC | PRN

## 2016-03-13 MED ORDER — SODIUM CHLORIDE 0.9 % IV SOLN: 250.0000 mL | INTRAVENOUS | Status: DC

## 2016-03-13 MED ORDER — FENTANYL CITRATE (PF) 100 MCG/2ML IJ SOLN
INTRAMUSCULAR | Status: DC | PRN
  Administered 2016-03-13 (×2): 50 ug via INTRAVENOUS
  Administered 2016-03-13: 100 ug via INTRAVENOUS
  Administered 2016-03-13: 50 ug via INTRAVENOUS

## 2016-03-13 MED ORDER — VANCOMYCIN HCL 1000 MG IV SOLR
INTRAVENOUS | Status: AC
  Filled 2016-03-13: qty 1000

## 2016-03-13 MED ORDER — CEFAZOLIN SODIUM 1-5 GM-% IV SOLN
1.0000 g | Freq: Three times a day (TID) | INTRAVENOUS | Status: AC
  Administered 2016-03-13 – 2016-03-14 (×2): 1 g via INTRAVENOUS
  Filled 2016-03-13 (×2): qty 50

## 2016-03-13 MED ORDER — HYDROCODONE-ACETAMINOPHEN 5-325 MG PO TABS
1.0000 | ORAL_TABLET | ORAL | Status: DC | PRN
  Administered 2016-03-13 – 2016-03-14 (×2): 2 via ORAL
  Filled 2016-03-13 (×2): qty 2

## 2016-03-13 MED ORDER — NEOSTIGMINE METHYLSULFATE 10 MG/10ML IV SOLN
INTRAVENOUS | Status: AC
  Filled 2016-03-13: qty 2

## 2016-03-13 SURGICAL SUPPLY — 62 items
BAG DECANTER FOR FLEXI CONT (MISCELLANEOUS) ×2 IMPLANT
BENZOIN TINCTURE PRP APPL 2/3 (GAUZE/BANDAGES/DRESSINGS) ×2 IMPLANT
BLADE CLIPPER SURG (BLADE) ×2 IMPLANT
BRUSH SCRUB EZ PLAIN DRY (MISCELLANEOUS) ×2 IMPLANT
BUR CUTTER 7.0 ROUND (BURR) ×2 IMPLANT
BUR MATCHSTICK NEURO 3.0 LAGG (BURR) ×2 IMPLANT
CANISTER SUCT 3000ML PPV (MISCELLANEOUS) ×2 IMPLANT
CAP LCK SPNE (Orthopedic Implant) ×4 IMPLANT
CAP LOCK SPINE RADIUS (Orthopedic Implant) ×4 IMPLANT
CAP LOCKING (Orthopedic Implant) ×4 IMPLANT
CONT SPEC 4OZ CLIKSEAL STRL BL (MISCELLANEOUS) ×2 IMPLANT
COVER BACK TABLE 60X90IN (DRAPES) ×2 IMPLANT
DECANTER SPIKE VIAL GLASS SM (MISCELLANEOUS) IMPLANT
DEVICE INTERBODY ELEVATE 23X10 (Cage) ×4 IMPLANT
DRAPE C-ARM 42X72 X-RAY (DRAPES) ×4 IMPLANT
DRAPE LAPAROTOMY 100X72X124 (DRAPES) ×2 IMPLANT
DRAPE POUCH INSTRU U-SHP 10X18 (DRAPES) ×2 IMPLANT
DRAPE PROXIMA HALF (DRAPES) ×2 IMPLANT
DRAPE SURG 17X23 STRL (DRAPES) ×8 IMPLANT
DRSG OPSITE POSTOP 4X6 (GAUZE/BANDAGES/DRESSINGS) ×2 IMPLANT
DURAPREP 26ML APPLICATOR (WOUND CARE) ×2 IMPLANT
ELECT REM PT RETURN 9FT ADLT (ELECTROSURGICAL) ×2
ELECTRODE REM PT RTRN 9FT ADLT (ELECTROSURGICAL) ×1 IMPLANT
EVACUATOR 1/8 PVC DRAIN (DRAIN) IMPLANT
GAUZE SPONGE 4X4 12PLY STRL (GAUZE/BANDAGES/DRESSINGS) IMPLANT
GAUZE SPONGE 4X4 16PLY XRAY LF (GAUZE/BANDAGES/DRESSINGS) IMPLANT
GLOVE BIOGEL PI IND STRL 7.0 (GLOVE) ×3 IMPLANT
GLOVE BIOGEL PI IND STRL 7.5 (GLOVE) ×3 IMPLANT
GLOVE BIOGEL PI INDICATOR 7.0 (GLOVE) ×3
GLOVE BIOGEL PI INDICATOR 7.5 (GLOVE) ×3
GLOVE ECLIPSE 9.0 STRL (GLOVE) ×4 IMPLANT
GLOVE EXAM NITRILE LRG STRL (GLOVE) IMPLANT
GLOVE EXAM NITRILE MD LF STRL (GLOVE) IMPLANT
GLOVE EXAM NITRILE XL STR (GLOVE) IMPLANT
GLOVE EXAM NITRILE XS STR PU (GLOVE) IMPLANT
GLOVE INDICATOR 7.0 STRL GRN (GLOVE) ×2 IMPLANT
GOWN STRL REUS W/ TWL LRG LVL3 (GOWN DISPOSABLE) ×2 IMPLANT
GOWN STRL REUS W/ TWL XL LVL3 (GOWN DISPOSABLE) ×2 IMPLANT
GOWN STRL REUS W/TWL 2XL LVL3 (GOWN DISPOSABLE) IMPLANT
GOWN STRL REUS W/TWL LRG LVL3 (GOWN DISPOSABLE) ×2
GOWN STRL REUS W/TWL XL LVL3 (GOWN DISPOSABLE) ×2
KIT BASIN OR (CUSTOM PROCEDURE TRAY) ×2 IMPLANT
KIT ROOM TURNOVER OR (KITS) ×2 IMPLANT
LIQUID BAND (GAUZE/BANDAGES/DRESSINGS) ×2 IMPLANT
MILL MEDIUM DISP (BLADE) ×2 IMPLANT
NEEDLE HYPO 22GX1.5 SAFETY (NEEDLE) ×2 IMPLANT
NS IRRIG 1000ML POUR BTL (IV SOLUTION) ×2 IMPLANT
PACK LAMINECTOMY NEURO (CUSTOM PROCEDURE TRAY) ×2 IMPLANT
ROD RADIUS 35MM (Rod) ×4 IMPLANT
SCREW 6.75X40MM (Screw) ×4 IMPLANT
SCREW 6.75X45MM (Screw) ×4 IMPLANT
SPONGE SURGIFOAM ABS GEL 100 (HEMOSTASIS) ×2 IMPLANT
STRIP CLOSURE SKIN 1/2X4 (GAUZE/BANDAGES/DRESSINGS) ×2 IMPLANT
SUT VIC AB 0 CT1 18XCR BRD8 (SUTURE) ×1 IMPLANT
SUT VIC AB 0 CT1 8-18 (SUTURE) ×1
SUT VIC AB 2-0 CT1 18 (SUTURE) ×2 IMPLANT
SUT VIC AB 3-0 SH 8-18 (SUTURE) ×4 IMPLANT
TOWEL OR 17X24 6PK STRL BLUE (TOWEL DISPOSABLE) IMPLANT
TOWEL OR 17X26 10 PK STRL BLUE (TOWEL DISPOSABLE) ×2 IMPLANT
TRAY FOLEY W/METER SILVER 14FR (SET/KITS/TRAYS/PACK) IMPLANT
TRAY FOLEY W/METER SILVER 16FR (SET/KITS/TRAYS/PACK) ×2 IMPLANT
WATER STERILE IRR 1000ML POUR (IV SOLUTION) ×2 IMPLANT

## 2016-03-13 NOTE — Op Note (Signed)
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  L4-5 posterior lumbar interbody fusion utilizing expandable interbody cages and local autograft  L4-5 posterior lateral arthrodesis utilizing nonsegmental pedicle screw instrumentation and local autograft  Surgeon:Zedekiah Hinderman A.Keynan Heffern, M.D.  Asst. Surgeon: Ditty  Anesthesia: General  Indication: 70 year old male with severe back and bilateral lower extremity pain. Seizures and weakness consistent with neurogenic claudication. Workup demonstrates evidence of a mobile grade 1/grade 2 degenerative spondylolisthesis with severe stenosis. Patient presents now for decompression and fusion in hopes of improving his symptoms.  Operative note: After induction of anesthesia, patient positioned prone onto Wilson frame and a properly padded. Lumbar region prepped and draped sterilely. Incision made overlying L4-5. Dissection performed bilaterally. Retractor placed. Fluoroscopy used. Level confirmed. Bilateral decompressive laminotomies then performed using Leksell rongeurs Kerrison rongeurs and the high-speed drill to remove the inferior two thirds of the lamina of L4 the entire inferior facet of L4 bilaterally the majority the superior facet of L5 bilaterally and the superior rim of the L5 lamina. Ligament flavum was elevated and resected piecemeal fashion. Underlying thecal sac next thing L4 and L5 nerve roots identified. Wide decompressive foraminotomies performed on course exiting L4 and L5 nerve roots. Epidermides plexus quite related and cut. Bilateral discectomies were then performed. A 10 mm distractor was placed in the  patient's right side. Disc space was prepared for interbody fusion on the left side. A 10 mm expandable Medtronic cage packed with autograft was impacted in place and expanded to its full extent. Distractor was removed patient's right side. Disc space prepared on the right side. Morselized autograft packed in the interspace. A second 10 mm x 12 expandable cage was impacted into place and expanded to its full extent. Pedicles of L4 and L5 were identified using surface landmarks and intraoperative fluoroscopy. Sufficient bone Irvine pedicles and removed using high-speed drill. Each pedicles and probed using pedicle awl each pedicle awl track was probed and found to be solidly within the bone. 6.75 mm radius brand screws from [employer] were placed bilaterally at L4 and L5. Short segment titanium rod placed over the screw heads at L4 and L5. Locking caps placed over the screw. Locking caps placed over the screw heads. Locking caps were engaged with the construct under compression. Final images revealed good position the bone graft and hardware at proper upper level with normal alignment is spine. Wounds and irrigated with and bike solution. Transverse processes decorticated. Morselize autograft packed posterior laterally. Gelfoam placed over the laminotomy defects. Vancomycin powder placed the deep wound space. Wounds and close in layers of Vicryl sutures. Steri-Strips and sterile dressing were applied. There were no apparent complications. Patient tolerated the procedure well and she returns to the recovery room postop.

## 2016-03-13 NOTE — Transfer of Care (Signed)
Immediate Anesthesia Transfer of Care Note  Patient: Albert PertRoger P Steinhauser Jr.  Procedure(s) Performed: Procedure(s): Posterior Lumbar Interbody Fusion - Lumbar four-Lumbar five (N/A)  Patient Location: PACU  Anesthesia Type:General  Level of Consciousness: awake, alert  and oriented  Airway & Oxygen Therapy: Patient Spontanous Breathing and Patient connected to nasal cannula oxygen  Post-op Assessment: Report given to RN, Post -op Vital signs reviewed and stable and Patient moving all extremities X 4  Post vital signs: Reviewed and stable  Last Vitals:  Filed Vitals:   03/13/16 0937 03/13/16 0938  BP:  167/96  Pulse: 83   Temp: 36.7 C   Resp: 20     Complications: No apparent anesthesia complications

## 2016-03-13 NOTE — Brief Op Note (Signed)
03/13/2016  2:22 PM  PATIENT:  Albert Pertoger P Lafond Jr.  70 y.o. male  PRE-OPERATIVE DIAGNOSIS:  Degenerative Spondylolisthesis  POST-OPERATIVE DIAGNOSIS:  Degenerative Spondylolisthesis  PROCEDURE:  Procedure(s): Posterior Lumbar Interbody Fusion - Lumbar four-Lumbar five (N/A)  SURGEON:  Surgeon(s) and Role:    * Julio SicksHenry Pinkie Manger, MD - Primary    * Loura HaltBenjamin Jared Ditty, MD - Assisting  PHYSICIAN ASSISTANT:   ASSISTANTS:    ANESTHESIA:   general  EBL:  Total I/O In: 2000 [I.V.:2000] Out: 275 [Urine:125; Blood:150]  BLOOD ADMINISTERED:none  DRAINS: none   LOCAL MEDICATIONS USED:  NONE  SPECIMEN:  No Specimen  DISPOSITION OF SPECIMEN:  N/A  COUNTS:  YES  TOURNIQUET:  * No tourniquets in log *  DICTATION: .Dragon Dictation  PLAN OF CARE: Admit to inpatient   PATIENT DISPOSITION:  PACU - hemodynamically stable.   Delay start of Pharmacological VTE agent (>24hrs) due to surgical blood loss or risk of bleeding: yes

## 2016-03-13 NOTE — Anesthesia Preprocedure Evaluation (Signed)
Anesthesia Evaluation  Patient identified by MRN, date of birth, ID band Patient awake    Reviewed: Allergy & Precautions, NPO status , Patient's Chart, lab work & pertinent test results  Airway Mallampati: II  TM Distance: >3 FB Neck ROM: Full    Dental no notable dental hx.    Pulmonary Current Smoker,    Pulmonary exam normal breath sounds clear to auscultation       Cardiovascular negative cardio ROS Normal cardiovascular exam Rhythm:Regular Rate:Normal     Neuro/Psych Anxiety negative neurological ROS     GI/Hepatic negative GI ROS, Neg liver ROS,   Endo/Other  negative endocrine ROS  Renal/GU negative Renal ROS  negative genitourinary   Musculoskeletal negative musculoskeletal ROS (+)   Abdominal   Peds negative pediatric ROS (+)  Hematology negative hematology ROS (+)   Anesthesia Other Findings   Reproductive/Obstetrics negative OB ROS                             Anesthesia Physical Anesthesia Plan  ASA: II  Anesthesia Plan: General   Post-op Pain Management:    Induction: Intravenous  Airway Management Planned: Oral ETT  Additional Equipment:   Intra-op Plan:   Post-operative Plan: Extubation in OR  Informed Consent: I have reviewed the patients History and Physical, chart, labs and discussed the procedure including the risks, benefits and alternatives for the proposed anesthesia with the patient or authorized representative who has indicated his/her understanding and acceptance.   Dental advisory given  Plan Discussed with: CRNA and Surgeon  Anesthesia Plan Comments:         Anesthesia Quick Evaluation

## 2016-03-13 NOTE — H&P (Signed)
  Albert P Marigene Ehlersngram Jr. is an 70 y.o. male.   Chief Complaint: Back and bilateral leg pain HPI: 70 year old male with severe back and bilateral lower extremity pain consistent with neurogenic claudication. Workup demonstrates evidence of a grade 1/grade 2 degenerative spondylolisthesis with marked stenosis at L4-5. Patient has failed conservative management and presents now for decompression and fusion in hopes of improving his symptoms.  Past Medical History  Diagnosis Date  . Back pain   . Arthritis   . Diverticulosis   . Anxiety     takes Xanax daily as needed  . History of migraine     takes Imitrex daily as needed.Last one 2 wks ago  . Hyperlipidemia     hx of-has been off of meds for about a yr  . Joint pain   . Chronic back pain   . GERD (gastroesophageal reflux disease)     OTC meds as needed  . History of colon polyps     benign   . History of kidney stones   . Insomnia     only recent d/t pain    Past Surgical History  Procedure Laterality Date  . Tonsillectomy  1962    adenoidectomy   . Vasectomy  1973  . Arthroscopic repair acl Right 1993  . Ankle surgery Right 1997    ankles/screws   . Esophagogastroduodenoscopy    . Colonoscopy      History reviewed. No pertinent family history. Social History:  reports that he has been smoking.  He does not have any smokeless tobacco history on file. He reports that he does not drink alcohol or use illicit drugs.  Allergies:  Allergies  Allergen Reactions  . Codeine Other (See Comments)    unknown    Medications Prior to Admission  Medication Sig Dispense Refill  . acetaminophen (TYLENOL) 325 MG tablet Take 650 mg by mouth every 6 (six) hours as needed.    . ALPRAZolam (XANAX) 0.25 MG tablet Take 0.25 mg by mouth 3 (three) times daily as needed for anxiety.    . SUMAtriptan (IMITREX) 50 MG tablet Take 50 mg by mouth every 2 (two) hours as needed for migraine. May repeat in 2 hours if headache persists or recurs.       No results found for this or any previous visit (from the past 48 hour(s)). No results found.    Blood pressure 167/96, pulse 83, temperature 98 F (36.7 C), temperature source Oral, resp. rate 20, weight 77.565 kg (171 lb), SpO2 99 %.  The patient is awake and alert. He is oriented and appropriate. His cranial nerve function is intact. Motor and sensory function of his extremities are normal. Reflexes are hypoactive in both lower extremities with absent Achilles reflexes bilaterally. Gait is antalgic bilaterally. Posture is stooped. Examination head ears eyes and throat is unremarkable. Chest and abdomen are benign. Extremities are free from injury deformity. Assessment/Plan  Grade 1/2 L4-L5 degenerative spondylolisthesis with severe stenosis. Plan bilateral L4-5 decompressive laminotomies and foraminotomies followed by posterior lumbar interbody fusion utilizing expandable interbody cages and local autograft coupled with posterior lateral arthrodesis utilizing nonsegmental pedicle screw instrumentation and local autografting. Risks and benefits have been explained. Patient wishes to proceed.  Albert [name] A 03/13/2016, 10:55 AM

## 2016-03-13 NOTE — Anesthesia Procedure Notes (Signed)
Procedure Name: Intubation Date/Time: 03/13/2016 11:35 AM Performed by: Marena ChancyBECKNER, Joanann Mies S Pre-anesthesia Checklist: Emergency Drugs available, Patient identified, Timeout performed, Suction available and Patient being monitored Patient Re-evaluated:Patient Re-evaluated prior to inductionOxygen Delivery Method: Circle system utilized Preoxygenation: Pre-oxygenation with 100% oxygen Intubation Type: IV induction Ventilation: Mask ventilation without difficulty Laryngoscope Size: Miller and 3 Grade View: Grade I Tube type: Oral Tube size: 8.0 mm Number of attempts: 1 Placement Confirmation: ETT inserted through vocal cords under direct vision,  breath sounds checked- equal and bilateral and positive ETCO2 Tube secured with: Tape Dental Injury: Teeth and Oropharynx as per pre-operative assessment

## 2016-03-13 NOTE — Anesthesia Postprocedure Evaluation (Signed)
Anesthesia Post Note  Patient: Albert PertRoger P Gfeller Jr.  Procedure(s) Performed: Procedure(s) (LRB): Posterior Lumbar Interbody Fusion - Lumbar four-Lumbar five (N/A)  Patient location during evaluation: PACU Anesthesia Type: General Level of consciousness: awake and alert Pain management: pain level controlled Vital Signs Assessment: post-procedure vital signs reviewed and stable Respiratory status: spontaneous breathing, nonlabored ventilation, respiratory function stable and patient connected to nasal cannula oxygen Cardiovascular status: blood pressure returned to baseline and stable Postop Assessment: no signs of nausea or vomiting Anesthetic complications: no    Last Vitals:  Filed Vitals:   03/13/16 1434 03/13/16 1435  BP: 159/85   Pulse: 95 97  Temp:    Resp:  30    Last Pain:  Filed Vitals:   03/13/16 1441  PainSc: 2                  Francisca Harbuck S

## 2016-03-14 MED ORDER — DIAZEPAM 5 MG PO TABS: 5.0000 mg | ORAL_TABLET | Freq: Four times a day (QID) | ORAL | Status: DC | PRN

## 2016-03-14 MED ORDER — OXYCODONE-ACETAMINOPHEN 5-325 MG PO TABS: 1.0000 | ORAL_TABLET | ORAL | Status: DC | PRN

## 2016-03-14 NOTE — Progress Notes (Signed)
PT Discharge Note  Patient Details Name: Albert PertRoger P Erlich Jr. MRN: [id number] DOB: [birth year]   Cancelled Treatment:    Reason Eval/Treat Not Completed: PT screened, no needs identified, will sign off  Patient functioning at a high level of independence and no acute PT needs are identified at this time. Ambulating in hallways without need for assistance. States he feels confident in his abilities and will have assist as needed at home. Verbalizes understanding of precautions and brace use. PT is signing off. Thank you for this referral. Berton MountBarbour, Conya Ellinwood S 03/14/2016, 10:34 AM  Charlsie MerlesLogan Secor Hideko Esselman, PT [phone]

## 2016-03-14 NOTE — Discharge Summary (Signed)
Physician Discharge Summary  Patient ID: Albert PertRoger P Whitehair Jr. MRN: [id number] DOB/AGE: [birth year] 69 y.o.  Admit date: 03/13/2016 Discharge date: 03/14/2016  Admission Diagnoses:  Discharge Diagnoses:  Active Problems:   Degenerative spondylolisthesis   Discharged Condition: good  Hospital Course: Patient admitted to the hospital where he underwent an uncomplicated L4-5 decompression and fusion. Postoperatively he is doing well.  Back and leg pain very much improved. Standing and walking without difficulty. 30 for discharge home.  Consults:   Significant Diagnostic Studies:   Treatments:   Discharge Exam: Blood pressure 105/63, pulse 72, temperature 98.4 F (36.9 C), temperature source Oral, resp. rate 18, weight 77.565 kg (171 lb), SpO2 97 %. Awake and alert. Oriented and appropriate. Motor and sensory function intact. Wound clean and dry. Chest and abdomen benign.  Disposition: 01-Home or Self Care     Medication List    TAKE these medications        acetaminophen 325 MG tablet  Commonly known as:  TYLENOL  Take 650 mg by mouth every 6 (six) hours as needed.     ALPRAZolam 0.25 MG tablet  Commonly known as:  XANAX  Take 0.25 mg by mouth 3 (three) times daily as needed for anxiety.     diazepam 5 MG tablet  Commonly known as:  VALIUM  Take 1-2 tablets (5-10 mg total) by mouth every 6 (six) hours as needed for muscle spasms.     oxyCODONE-acetaminophen 5-325 MG tablet  Commonly known as:  PERCOCET/ROXICET  Take 1-2 tablets by mouth every 4 (four) hours as needed for moderate pain.     SUMAtriptan 50 MG tablet  Commonly known as:  IMITREX  Take 50 mg by mouth every 2 (two) hours as needed for migraine. May repeat in 2 hours if headache persists or recurs.           Follow-up Information    Follow up with Temple PaciniPOOL,Jen Eppinger A, MD.   Specialty:  Neurosurgery   Contact information:   [address] [place] [address] [phone]        Signed: Temple PaciniOOL,Shalese Strahan A 03/14/2016, 9:40 AM

## 2016-03-14 NOTE — Progress Notes (Signed)
Pt doing well. Pt and wife given D/C instructions with Rx's, verbal understanding was provided. Pt's incision is clean and dry with no sign of infection. Pt's IV was removed prior to D/C. Pt D/C'd home via wheelchair @ 1120 per MD order. Pt is stable @ D/C and has no other needs at this time. Rema FendtAshley Delana Manganello, RN

## 2016-03-14 NOTE — Evaluation (Signed)
Occupational Therapy Evaluation Patient Details Name: Albert PertRoger P Helwig Jr. MRN: [id number] DOB: 1946/05/10 Today's Date: 03/14/2016    History of Present Illness 70 y.o. s/p  Posterior Lumbar Interbody Fusion - Lumbar four-Lumbar five. PMH includes back pain, arthritis, anxiety, diverticulosis, hyperlipidemia, migraine, GERD, kidney stones, insomnia, and ankle surgery.   Clinical Impression   Pt s/p above. Pt independent with ADLs, PTA. Feel pt will benefit from acute OT to reinforce precautions prior to d/c.     Follow Up Recommendations  No OT follow up;Supervision - Intermittent    Equipment Recommendations  None recommended by OT    Recommendations for Other Services       Precautions / Restrictions Precautions Precautions: Back Precaution Booklet Issued: Yes (comment) Precaution Comments: educated on back precautions Required Braces or Orthoses: Spinal Brace Spinal Brace: Lumbar corset;Applied in sitting position Restrictions Weight Bearing Restrictions: No      Mobility Bed Mobility Overal bed mobility: Needs Assistance Bed Mobility: Sidelying to Sit;Sit to Sidelying   Sidelying to sit: Supervision     Sit to sidelying: Supervision    Transfers Overall transfer level: Needs assistance   Transfers: Sit to/from Stand Sit to Stand: Min guard (Min guard-supervision)              Balance    Stepped over simulated tub without LOB.                                         ADL Overall ADL's : Needs assistance/impaired Eating/Feeding: Supervision/ safety;Sitting               Upper Body Dressing : Set up;Supervision/safety;Sitting   Lower Body Dressing: Set up;Supervision/safety;Sit to/from stand   Toilet Transfer: Min guard;Ambulation (sit to stand from bed)       Tub/ Shower Transfer: Tub transfer;Min guard;Ambulation   Functional mobility during ADLs: Min guard (Min guard-Supervision ) General ADL Comments: Educated on LB  ADL technique and AE. Discussed back brace-pt did not have it on when OT arrived and he was sitting EOB. Educated on safety. Educated on tub transfer technique. Discussed incorporating precautions into functional activities.      Vision     Perception     Praxis      Pertinent Vitals/Pain Pain Assessment: 0-10 Pain Score:  (8-9) Pain Location: back Pain Descriptors / Indicators: Grimacing Pain Intervention(s): Repositioned;Monitored during session     Hand Dominance Right   Extremity/Trunk Assessment Upper Extremity Assessment Upper Extremity Assessment: Overall WFL for tasks assessed   Lower Extremity Assessment Lower Extremity Assessment: Defer to PT evaluation; initially had difficulty crossing ankles over knees, then pt able to do so.        Communication Communication Communication: No difficulties   Cognition Arousal/Alertness: Awake/alert Behavior During Therapy: WFL for tasks assessed/performed Overall Cognitive Status: No family/caregiver present to determine baseline cognitive functioning (however several cues for back precautions; pt able to state precautions at end of session)       Memory: Decreased recall of precautions;Decreased short-term memory             General Comments       Exercises       Shoulder Instructions      Home Living Family/patient expects to be discharged to:: Private residence Living Arrangements: Spouse/significant other Available Help at Discharge: Family;Available PRN/intermittently Type of Home: Apartment Home Access: Level entry  Home Layout: One level     Bathroom Shower/Tub: Tub/shower unit         Home Equipment: Toilet riser;Shower seat (access to walker, cane and BSC per pt report)          Prior Functioning/Environment Level of Independence: Independent             OT Diagnosis: Acute pain   OT Problem List: Decreased knowledge of use of DME or AE;Pain;Decreased knowledge of  precautions;Decreased cognition;Decreased activity tolerance   OT Treatment/Interventions: Self-care/ADL training;DME and/or AE instruction;Therapeutic activities;Patient/family education;Balance training    OT Goals(Current goals can be found in the care plan section) Acute Rehab OT Goals Patient Stated Goal: not stated OT Goal Formulation: With patient Time For Goal Achievement: 03/21/16 Potential to Achieve Goals: Good ADL Goals Pt Will Perform Lower Body Dressing: with set-up;sit to/from stand Pt Will Transfer to Toilet: with modified independence;ambulating (elevated toilet) Additional ADL Goal #1: Pt will independently verbalize 3/3 back precautions and maintain during ADLs/functional activity.  OT Frequency: Min 2X/week   Barriers to D/C:            Co-evaluation              End of Session Equipment Utilized During Treatment: Other (comment) (AE)  Activity Tolerance: Patient tolerated treatment well Patient left: in chair;with call bell/phone within reach   Time: 0831-0900 (161-096; and OT stepped back in with pt from [phone]; total time=29 minutes) OT Time Calculation (min): 29 min Charges:  OT General Charges $OT Visit: 1 Procedure OT Evaluation $OT Eval Moderate Complexity: 1 Procedure OT Treatments $Self Care/Home Management : 8-22 mins G-CodesEarlie Raveling OTR/L [phone] 03/14/2016, 11:47 AM

## 2016-03-14 NOTE — Discharge Instructions (Signed)

## 2016-03-15 MED FILL — Heparin Sodium (Porcine) Inj 1000 Unit/ML: INTRAMUSCULAR | Qty: 30 | Status: AC

## 2016-03-15 MED FILL — Sodium Chloride IV Soln 0.9%: INTRAVENOUS | Qty: 1000 | Status: AC

## 2016-07-02 ENCOUNTER — Ambulatory Visit: Payer: Medicare Other | Attending: Neurosurgery

## 2016-07-02 DIAGNOSIS — M542 Cervicalgia: Secondary | ICD-10-CM

## 2016-07-02 DIAGNOSIS — M545 Low back pain, unspecified: Secondary | ICD-10-CM

## 2016-07-02 DIAGNOSIS — M546 Pain in thoracic spine: Secondary | ICD-10-CM | POA: Insufficient documentation

## 2016-07-02 NOTE — Therapy (Signed)
[facility] PHYSICAL AND SPORTS MEDICINE [address], [place], [address] Phone: [phone]   Fax:  [phone]  Physical Therapy Evaluation  Patient Details  Name: Albert [name] MRN: [id number] Date of Birth: Nov 26, 1946 Referring Provider: Sherilyn Cooter A. Pool, MD  Encounter Date: 07/02/2016      PT End of Session - 07/02/16 1648    Visit Number 1   Number of Visits 13   Date for PT Re-Evaluation 08/16/16   Authorization Type 1   Authorization Time Period of 10   PT Start Time 1649   PT Stop Time 1801   PT Time Calculation (min) 72 min   Activity Tolerance Patient tolerated treatment well   Behavior During Therapy WFL for tasks assessed/performed      Past Medical History  Diagnosis Date  . Back pain   . Arthritis   . Diverticulosis   . Anxiety     takes Xanax daily as needed  . History of migraine     takes Imitrex daily as needed.Last one 2 wks ago  . Hyperlipidemia     hx of-has been off of meds for about a yr  . Joint pain   . Chronic back pain   . GERD (gastroesophageal reflux disease)     OTC meds as needed  . History of colon polyps     benign   . History of kidney stones   . Insomnia     only recent d/t pain    Past Surgical History  Procedure Laterality Date  . Tonsillectomy  1962    adenoidectomy   . Vasectomy  1973  . Arthroscopic repair acl Right 1993  . Ankle surgery Right 1997    ankles/screws   . Esophagogastroduodenoscopy    . Colonoscopy    . Lumbar fusion  March 13, 2016    L4/L5 fusion    There were no vitals filed for this visit.       Subjective Assessment - 07/02/16 1653    Subjective R upper back/thoracic/scapular area: 6-7/10 currently (pt sitting), 8-9/10 at most (feels a hard knot; sitting up); low back pain level: 0/10 currently (pt sitting), 3-4/10 at worst after sitting 30 min to an hour.    Pertinent History S/P lumbar L4/L5 fusion on 03/13/2016. Last 2-3 weeks, pt started  having muscle spasm in his R upper back. Started taking diazepam which is causing muscle weakness for his legs which caused him to use a cane. Had back surgery March 13, 2016 to fuse his L4/L5. Pt was walking well following the surgery until taking diazepam for his R upper back musle spams. Was doing well around June 14, 2016.  Has been taking diazepam for the past 2 weeks which helps with the pain. No known method of injury for his R upper back. Pt also states having a natural curvature at the middle of his back. Pt wife said that the patient might have been compensating which might have caused to R upper back pain. The surgery helped "big time" with his low back pain.  Denies bowel or bladder difficulties or saddle anesthesia.  Pt also states that he stopped taking diazepam since this morning. Now taking Tylenol.     Diagnostic tests Had not had imaging for his upper back and neck   Patient Stated Goals Pt expresses desire to get better.    Currently in Pain? Yes   Pain Score 7   6-7/10 R upper back/thoracic/scapular area.  0/10 low back   Pain Location Back   Pain Orientation Right   Pain Descriptors / Indicators Aching   Pain Type Acute pain;Surgical pain  R upper back acute; low back surgical   Pain Onset 1 to 4 weeks ago   Pain Frequency Constant   Aggravating Factors  Driving (difficulty turning his head to the R), golfing, fishing bothers his R upper back. Sitting too long (30 minutes) bothers his incision site.    Pain Relieving Factors heating pad, diazepam.    Multiple Pain Sites Yes  R upper/thoracic scapular area; low back            [facility] PT Assessment - 07/02/16 1652    Assessment   Medical Diagnosis Spondylolisthesis, site unspecified   Referring Provider Sherilyn Cooter A. Pool, MD   Onset Date/Surgical Date 06/14/16   Hand Dominance Right   Prior Therapy No known therapy for current condition   Precautions   Precaution Comments potential fall risk   Restrictions   Other  Position/Activity Restrictions Back precautions   Balance Screen   Has the patient fallen in the past 6 months Yes  LOB but pt caught himself   How many times? 1  after the surgery when on oxycodone   Has the patient had a decrease in activity level because of a fear of falling?  No  but has fear of falling.    Is the patient reluctant to leave their home because of a fear of falling?  No  but has fear of falling.    Home Environment   Additional Comments Lives in an apartment with his wife, no stairs.    Prior Function   Vocation Retired   Gaffer PLOF: better able to turn his head, drive, golf, go fishing with less upper back/thoracic/scapular pain   Observation/Other Assessments   Observations Surgical incision healed well   Modified Oswertry 68%   Posture/Postural Control   Posture Comments bilaterally protracted shoulders and neck, slight R cervical side bend, L lateral shift, decreased lumbar extension    AROM   Cervical Flexion WFL with R lateral neck pain (with reproduction of symptoms; neck and R upper back pain)   Cervical Extension limited with reproduction of R lateral neck/upper back pain   Cervical - Right Side [facility] with a little bit of pain   Cervical - Left Side [facility] with a little bit of R neck pain (less that R cervical side bend)    Cervical - Right Rotation WFL with most reproduction of R cervical and R upper back pain   Cervical - Left Rotation WFL with reproduction of symptoms   Strength   Overall Strength Comments --   Right Hip Flexion 4/5   Left Hip Flexion 4/5   Right Knee Flexion 4+/5   Right Knee Extension 5/5   Left Knee Flexion 5/5   Left Knee Extension 5/5   Palpation   Palpation comment L rotated T1 and C6, C7 area. Increased R rhomboid muscle tone. TTP R rhomboid muscle area   Ambulation/Gait   Assistive device --   Gait Comments Forward flexed, decreased stance L LE, decreased bilateral hip extension. Hand held assist with  wife.   Pt states having a SPC at home      Objectives  There-ex  Directed patient with L scapular retraction 5x3 Then with 5 second holds x 2   Improved exercise technique, movement at target joints, use of target muscles after mod verbal, visual,  tactile cues.     Manual therapy  STM R rhomboid area  Difficulty secondary to TTP   High Volt E-stim to R rhoboid muscle area at 165 V x 15 min for pain control.   Decreased pain to 1-2/10 while the machine is on.   Pt states neck feels better after use of modality.                      PT Education - 07/02/16 1936    Education provided Yes   Education Details ther-ex, HEP, plan of care   Person(s) Educated Patient   Methods Explanation;Demonstration;Tactile cues;Verbal cues   Comprehension Verbalized understanding;Returned demonstration             PT Long Term Goals - 07/02/16 1950    PT LONG TERM GOAL #1   Title Patient will have a decrease in R upper back/thoracic/scapular pain to 4/10 or less at worst to promote ability to turn his head.    Baseline 8-9/10 at worst   Time 6   Period Weeks   Status New   PT LONG TERM GOAL #2   Title Patient will have a decrease in low back pain to 2/10 or less at worst to promote ability to sit for longer periods and perform sitting tasks   Baseline 3-4/10   Time 6   Period Weeks   Status New   PT LONG TERM GOAL #3   Title Patient will improve his Modified Oswestry Low Back Pain Disablity Questionnaire score by at least 6 points as a demonstration of improved function.    Baseline 68%   Time 6   Period Weeks   Status New   PT LONG TERM GOAL #4   Title Patient will be able to ambulate at least 500 ft independently and without LOB to promote mobility.    Baseline currently ambulates with hand held assist from wife or [facility] a [facility]   Time 6   Period Weeks   Status New               Plan - 07/02/16 1937    Clinical Impression Statement Patient is a  70 year old male who came to physical therapy S/P lumbar fusion of L4, and L5 on 03/13/2016 and recent onset of R upper back/thoracic/scapular pain (after 06/14/2016). He also presents with altered gait pattern and posture, LE weakness during standing activities such as walking, increased R scapular area muscle tone, TTP R upper back/thoracic/scapular area, and difficulty performing functional tasks such as walking, turning and moving his head, and driving due to pain. Patient will benefit from skilled physical therapy intervention to address the aforementioned deficits.    Rehab Potential Good   Clinical Impairments Affecting Rehab Potential pain, weakness, potential fall risk   PT Frequency 2x / week   PT Duration 6 weeks   PT Treatment/Interventions Therapeutic activities;Therapeutic exercise;Manual techniques;Aquatic Therapy;Electrical Stimulation;Iontophoresis 4mg /ml Dexamethasone;Neuromuscular re-education;Balance training;Gait training;Moist Heat;Ultrasound;Patient/family education;Dry needling   PT Next Visit Plan High Volt E-stim, modalities PRN as needed for pain, LE and scapular strengthening, posture   Consulted and Agree with Plan of Care Patient;Family member/caregiver   Family Member Consulted wife      Patient will benefit from skilled therapeutic intervention in order to improve the following deficits and impairments:  Pain, Abnormal gait, Decreased strength, Improper body mechanics, Postural dysfunction, Difficulty walking, Decreased balance  Visit Diagnosis: Bilateral low back pain without sciatica - Plan: PT plan of care cert/re-cert  Cervicalgia - Plan: PT plan of care cert/re-cert  Pain in thoracic spine - Plan: PT plan of care cert/re-cert      G-Codes - 07-25-2016 1959    Functional Assessment Tool Used Modified Oswestry Low Back Pain Disability Questionnaire, clinical presentation, patient interview   Functional Limitation Mobility: Walking and moving around   Mobility:  Walking and Moving Around Current Status [phone]) At least 60 percent but less than 80 percent impaired, limited or restricted   Mobility: Walking and Moving Around Goal Status [phone]) At least 20 percent but less than 40 percent impaired, limited or restricted       Problem List Patient Active Problem List   Diagnosis Date Noted  . Degenerative spondylolisthesis 03/13/2016    Thank you for your referral.  Loralyn Freshwater PT, DPT   2016-07-25, 8:05 PM  [facility] PHYSICAL AND SPORTS MEDICINE [address], [place], [address] Phone: [phone]   Fax:  [phone]  Name: Albert [name] MRN: [id number] Date of Birth: 07/18/1946

## 2016-07-02 NOTE — Patient Instructions (Signed)
  Pt was recommended to continue using the heating pad for his R upper back/thoracic/scapular area as well as to perform L scapular squeezes. If pt feels uncomfortable with the L scapular squeezes, then he can hold off on it to review during his next session. Pt and wife verbalized understanding.

## 2016-07-04 ENCOUNTER — Ambulatory Visit: Payer: Medicare Other

## 2016-07-04 DIAGNOSIS — M546 Pain in thoracic spine: Secondary | ICD-10-CM

## 2016-07-04 DIAGNOSIS — M545 Low back pain, unspecified: Secondary | ICD-10-CM

## 2016-07-04 DIAGNOSIS — M542 Cervicalgia: Secondary | ICD-10-CM

## 2016-07-04 NOTE — Therapy (Signed)
[facility] PHYSICAL AND SPORTS MEDICINE [address] [place], [place], [address] Phone: [phone]   Fax:  [phone]  Physical Therapy Treatment  Patient Details  Name: Albert PertRoger P Viall Jr. MRN: [id number] Date of Birth: [birth year] Referring Provider: Sherilyn CooterHenry A. Pool, MD  Encounter Date: 07/04/2016      PT End of Session - 07/04/16 1521    Visit Number 2   Number of Visits 13   Date for PT Re-Evaluation 08/16/16   Authorization Type 2   Authorization Time Period of 10   PT Start Time 1521   PT Stop Time 1637   PT Time Calculation (min) 76 min   Activity Tolerance Patient tolerated treatment well   Behavior During Therapy WFL for tasks assessed/performed      Past Medical History  Diagnosis Date  . Back pain   . Arthritis   . Diverticulosis   . Anxiety     takes Xanax daily as needed  . History of migraine     takes Imitrex daily as needed.Last one 2 wks ago  . Hyperlipidemia     hx of-has been off of meds for about a yr  . Joint pain   . Chronic back pain   . GERD (gastroesophageal reflux disease)     OTC meds as needed  . History of colon polyps     benign   . History of kidney stones   . Insomnia     only recent d/t pain    Past Surgical History  Procedure Laterality Date  . Tonsillectomy  1962    adenoidectomy   . Vasectomy  1973  . Arthroscopic repair acl Right 1993  . Ankle surgery Right 1997    ankles/screws   . Esophagogastroduodenoscopy    . Colonoscopy    . Lumbar fusion  March 13, 2016    L4/L5 fusion    There were no vitals filed for this visit.      Subjective Assessment - 07/04/16 1528    Subjective R upper back is about the same as it was.   Pertinent History S/P lumbar L4/L5 fusion on 03/13/2016. Last 2-3 weeks, pt started having muscle spasm in his R upper back. Started taking diazepam which is causing muscle weakness for his legs which caused him to use a cane. Had back surgery March 13, 2016 to  fuse his L4/L5. Pt was walking well following the surgery until taking diazepam for his R upper back musle spams. Was doing well around June 14, 2016.  Has been taking diazepam for the past 2 weeks which helps with the pain. No known method of injury for his R upper back. Pt also states having a natural curvature at the middle of his back. Pt wife said that the patient might have been compensating which might have caused to R upper back pain. The surgery helped "big time" with his low back pain.  Denies bowel or bladder difficulties or saddle anesthesia.  Pt also states that he stopped taking diazepam since this morning. Now taking Tylenol.     Diagnostic tests Had not had imaging for his upper back and neck   Patient Stated Goals Pt expresses desire to get better.    Currently in Pain? Yes   Pain Score --  No pain level provided.    Pain Onset 1 to 4 weeks ago         Objectives    Manual therapy  STM R rhomboid muscle  area   tolerated well today  No change in pain level.     There-ex  Directed patient with seated bilateral scapular retraction rows 10x (slight decreased symptoms in that position)    Seated bilateral rows resisting yellow band 10x2  Standing L shoulder extension with scapular retraction resisting yellow band 10x5 seconds for 2 sets  Difficulty separating L scapular retraction from R scapular retraction   Seated L shoulder ER AROM 10x3  Reviewed and given as part of his HEP. Pt and wife verbalized understanding.    Increased time secondary mod to max cues needed.    Improved exercise technique, movement at target joints, use of target muscles after mod to max verbal, visual, tactile cues.     E-Stim  Answered pt wife questions pertaining to portable TENS unit  Towards end of session:   High Volt E-stim to channel 1: R rhomboid muscle area at 170 V x 15 min for pain control.    Channel 2: R thoracic paraspinal muscles 170 V x 15 min for pain  control    While on E-stim: seated bilateral scapular retractions 3x     seated chin tucks 10x        Able to move neck freely with minimal to no complain of pain during High Volt E-stim. Better able to tolerate manual therapy and exercises today compared to initial evaluation. Difficulty separating L scapular muscle activation from R.                         PT Education - 07/04/16 1640    Education provided Yes   Education Details ther-ex, HEP, over the counter TENS unit   Person(s) Educated Patient   Methods Explanation;Demonstration;Tactile cues;Verbal cues   Comprehension Verbalized understanding;Returned demonstration             PT Long Term Goals - 07/02/16 1950    PT LONG TERM GOAL #1   Title Patient will have a decrease in R upper back/thoracic/scapular pain to 4/10 or less at worst to promote ability to turn his head.    Baseline 8-9/10 at worst   Time 6   Period Weeks   Status New   PT LONG TERM GOAL #2   Title Patient will have a decrease in low back pain to 2/10 or less at worst to promote ability to sit for longer periods and perform sitting tasks   Baseline 3-4/10   Time 6   Period Weeks   Status New   PT LONG TERM GOAL #3   Title Patient will improve his Modified Oswestry Low Back Pain Disablity Questionnaire score by at least 6 points as a demonstration of improved function.    Baseline 68%   Time 6   Period Weeks   Status New   PT LONG TERM GOAL #4   Title Patient will be able to ambulate at least 500 ft independently and without LOB to promote mobility.    Baseline currently ambulates with hand held assist from wife or uses a [facility]   Time 6   Period Weeks   Status New               Plan - 07/04/16 1638    Clinical Impression Statement Able to move neck freely with minimal to no complain of pain during High Volt E-stim. Better able to tolerate manual therapy and exercises today compared to initial evaluation.  Difficulty separating L scapular muscle activation from  R.    Rehab Potential Good   Clinical Impairments Affecting Rehab Potential pain, weakness, potential fall risk   PT Frequency 2x / week   PT Duration 6 weeks   PT Treatment/Interventions Therapeutic activities;Therapeutic exercise;Manual techniques;Aquatic Therapy;Electrical Stimulation;Iontophoresis 4mg /ml Dexamethasone;Neuromuscular re-education;Balance training;Gait training;Moist Heat;Ultrasound;Patient/family education;Dry needling   PT Next Visit Plan High Volt E-stim, modalities PRN as needed for pain, LE and scapular strengthening, posture   Consulted and Agree with Plan of Care Patient;Family member/caregiver   Family Member Consulted wife      Patient will benefit from skilled therapeutic intervention in order to improve the following deficits and impairments:  Pain, Abnormal gait, Decreased strength, Improper body mechanics, Postural dysfunction, Difficulty walking, Decreased balance  Visit Diagnosis: Bilateral low back pain without sciatica  Cervicalgia  Pain in thoracic spine     Problem List Patient Active Problem List   Diagnosis Date Noted  . Degenerative spondylolisthesis 03/13/2016    Loralyn Freshwater PT, DPT   07/04/2016, 4:51 PM  [facility] PHYSICAL AND SPORTS MEDICINE [address] [place], [address] Phone: [phone]   Fax:  [phone]  Name: Albert [name] MRN: [id number] Date of Birth: Jul 10, 1946

## 2016-07-04 NOTE — Patient Instructions (Signed)
Gave seated L shoulder ER AROM 10x3 daily   And seated chin tucks as part of his HEP.   Pt and wife demonstrated and verbalized understanding.

## 2016-07-09 ENCOUNTER — Ambulatory Visit: Payer: Medicare Other

## 2016-07-09 DIAGNOSIS — M545 Low back pain, unspecified: Secondary | ICD-10-CM

## 2016-07-09 DIAGNOSIS — M546 Pain in thoracic spine: Secondary | ICD-10-CM

## 2016-07-09 DIAGNOSIS — M542 Cervicalgia: Secondary | ICD-10-CM

## 2016-07-09 NOTE — Therapy (Signed)
[facility] [address] [place], [address] Phone: [phone]   Fax:  [phone]  Physical Therapy Treatment  Patient Details  Name: Albert [name] MRN: [id number] Date of Birth: 04-23-1946 Referring Provider: Sherilyn Cooter A. Pool, MD  Encounter Date: 07/09/2016      PT End of Session - 07/09/16 1703    Visit Number 3   Number of Visits 13   Date for PT Re-Evaluation 08/16/16   Authorization Type 3   Authorization Time Period of 10   PT Start Time 1703   PT Stop Time 1807   PT Time Calculation (min) 64 min   Activity Tolerance Patient tolerated treatment well   Behavior During Therapy WFL for tasks assessed/performed      Past Medical History:  Diagnosis Date  . Anxiety    takes Xanax daily as needed  . Arthritis   . Back pain   . Chronic back pain   . Diverticulosis   . GERD (gastroesophageal reflux disease)    OTC meds as needed  . History of colon polyps    benign   . History of kidney stones   . History of migraine    takes Imitrex daily as needed.Last one 2 wks ago  . Hyperlipidemia    hx of-has been off of meds for about a yr  . Insomnia    only recent d/t pain  . Joint pain     Past Surgical History:  Procedure Laterality Date  . ANKLE SURGERY Right 1997   ankles/screws   . ARTHROSCOPIC REPAIR ACL Right 1993  . COLONOSCOPY    . ESOPHAGOGASTRODUODENOSCOPY    . LUMBAR FUSION  March 13, 2016   L4/L5 fusion  . TONSILLECTOMY  1962   adenoidectomy   . VASECTOMY  1973    There were no vitals filed for this visit.      Subjective Assessment - 07/09/16 1705    Subjective The R upper back still bothers him but not as bad.    Pertinent History S/P lumbar L4/L5 fusion on 03/13/2016. Last 2-3 weeks, pt started having muscle spasm in his R upper back. Started taking diazepam which is causing muscle weakness for his legs which caused him to use a cane. Had back surgery March 13, 2016  to fuse his L4/L5. Pt was walking well following the surgery until taking diazepam for his R upper back musle spams. Was doing well around June 14, 2016.  Has been taking diazepam for the past 2 weeks which helps with the pain. No known method of injury for his R upper back. Pt also states having a natural curvature at the middle of his back. Pt wife said that the patient might have been compensating which might have caused to R upper back pain. The surgery helped "big time" with his low back pain.  Denies bowel or bladder difficulties or saddle anesthesia.  Pt also states that he stopped taking diazepam since this morning. Now taking Tylenol.     Diagnostic tests Had not had imaging for his upper back and neck   Patient Stated Goals Pt expresses desire to get better.    Currently in Pain? Yes   Pain Score 2   R upper back   Pain Onset 1 to 4 weeks ago         Objectives     There-ex  Directed patient with standing L shoulder extension resisting yellow band 10x3 (to  promote L rhomboid muscle use and more neutral positioning of lower cervical spine)  Standing bilateral shoulder ER resisting yellow band 10x2  Then with L UE 10x2  Seated bilateral scapular retraction to promote thoracic extension 2x5 seconds  Seated R scapular depression 5x5 seconds. Pain increased.   Improved exercise technique, movement at target joints, use of target muscles after mod verbal, visual, tactile cues.     E-Stim  About 15 min spent on reviewing portable TENs unit with pt's wife. Pt found portable unit to be uncomfortable. Utilized the [employer] instead    Towards end of session:   High Volt E-stim to channel 1: R rhomboid muscle area at 175 V with decreasing level of intensity x 6 min for pain control. Unable to tolerate today so ice pack x 10 min utilized instead for his R rhomboid area. No change in pain with ice pack per pt. Pt adds afterwards that he uses a heating pad at home  for his R shoulder which helps.                                              Channel 2: R thoracic paraspinal muscles 175 V x 15 min for pain control                                                                                            Better able to tolerate exercises today before increasing symptoms compared to initial evaluation. Difficulty tolerating the High Volt Estim to his R rhomboid area after trying a portable TENs unit. Used ice instead to that area which did not help with his symptoms. Pt stated afterwards that he uses a heating pad at home which helps. Pt was recommended to continue using his heating pad 15 minutes at a time at home. Pt verbalized understanding.                          PT Education - 07/09/16 1903    Education provided Yes   Education Details ther-ex   [employer]) Educated Patient   Methods Explanation;Demonstration;Verbal cues;Tactile cues   Comprehension Verbalized understanding;Returned demonstration             PT Long Term Goals - 07/02/16 1950      PT LONG TERM GOAL #1   Title Patient will have a decrease in R upper back/thoracic/scapular pain to 4/10 or less at worst to promote ability to turn his head.    Baseline 8-9/10 at worst   Time 6   Period Weeks   Status New     PT LONG TERM GOAL #2   Title Patient will have a decrease in low back pain to 2/10 or less at worst to promote ability to sit for longer periods and perform sitting tasks   Baseline 3-4/10   Time 6   Period Weeks   Status New     PT LONG TERM GOAL #3   Title Patient  will improve his Modified Oswestry Low Back Pain Disablity Questionnaire score by at least 6 points as a demonstration of improved function.    Baseline 68%   Time 6   Period Weeks   Status New     PT LONG TERM GOAL #4   Title Patient will be able to ambulate at least 500 ft independently and without LOB to promote mobility.    Baseline currently ambulates with hand held  assist from wife or uses a [facility]   Time 6   Period Weeks   Status New               Plan - 07/09/16 1904    Clinical Impression Statement Better able to tolerate exercises today before increasing symptoms compared to initial evaluation. Difficulty tolerating the High Volt Estim to his R rhomboid area after trying a portable TENs unit. Used ice instead to that area which did not help with his symptoms. Pt stated afterwards that he uses a heating pad at home which helps. Pt was recommended to continue using his heating pad 15 minutes at a time at home. Pt verbalized understanding.     Rehab Potential Good   Clinical Impairments Affecting Rehab Potential pain, weakness, potential fall risk   PT Frequency 2x / week   PT Duration 6 weeks   PT Treatment/Interventions Therapeutic activities;Therapeutic exercise;Manual techniques;Aquatic Therapy;Electrical Stimulation;Iontophoresis 4mg /ml Dexamethasone;Neuromuscular re-education;Balance training;Gait training;Moist Heat;Ultrasound;Patient/family education;Dry needling   PT Next Visit Plan High Volt E-stim, modalities PRN as needed for pain, LE and scapular strengthening, posture   Consulted and Agree with Plan of Care Patient;Family member/caregiver   Family Member Consulted wife      Patient will benefit from skilled therapeutic intervention in order to improve the following deficits and impairments:  Pain, Abnormal gait, Decreased strength, Improper body mechanics, Postural dysfunction, Difficulty walking, Decreased balance  Visit Diagnosis: Bilateral low back pain without sciatica  Cervicalgia  Pain in thoracic spine     Problem List Patient Active Problem List   Diagnosis Date Noted  . Degenerative spondylolisthesis 03/13/2016    Loralyn Freshwater PT, DPT   07/09/2016, 7:18 PM  [place] [facility] SPORTS MEDICINE [address], [place], [address] Phone: [phone]   Fax:   [phone]  Name: Albert [name] MRN: [id number] Date of Birth: [birth year]

## 2016-07-11 ENCOUNTER — Ambulatory Visit: Payer: Medicare Other

## 2016-07-11 DIAGNOSIS — M545 Low back pain, unspecified: Secondary | ICD-10-CM

## 2016-07-11 DIAGNOSIS — M542 Cervicalgia: Secondary | ICD-10-CM

## 2016-07-11 DIAGNOSIS — M546 Pain in thoracic spine: Secondary | ICD-10-CM

## 2016-07-11 NOTE — Therapy (Signed)
[facility] [facility] [address] [place], [address] Phone: [phone]   Fax:  [phone]  Physical Therapy Treatment  Patient Details  Name: Albert [name] MRN: [id number] Date of Birth: 03-19-1946 Referring Provider: Sherilyn Cooter A. Pool, MD  Encounter Date: 07/11/2016      PT End of Session - 07/11/16 1701    Visit Number 4   Number of Visits 13   Date for PT Re-Evaluation 08/16/16   Authorization Type 4   Authorization Time Period of 10   PT Start Time 1701   PT Stop Time 1752   PT Time Calculation (min) 51 min   Activity Tolerance Patient tolerated treatment well   Behavior During Therapy WFL for tasks assessed/performed      Past Medical History:  Diagnosis Date  . Anxiety    takes Xanax daily as needed  . Arthritis   . Back pain   . Chronic back pain   . Diverticulosis   . GERD (gastroesophageal reflux disease)    OTC meds as needed  . History of colon polyps    benign   . History of kidney stones   . History of migraine    takes Imitrex daily as needed.Last one 2 wks ago  . Hyperlipidemia    hx of-has been off of meds for about a yr  . Insomnia    only recent d/t pain  . Joint pain     Past Surgical History:  Procedure Laterality Date  . ANKLE SURGERY Right 1997   ankles/screws   . ARTHROSCOPIC REPAIR ACL Right 1993  . COLONOSCOPY    . ESOPHAGOGASTRODUODENOSCOPY    . LUMBAR FUSION  March 13, 2016   L4/L5 fusion  . TONSILLECTOMY  1962   adenoidectomy   . VASECTOMY  1973    There were no vitals filed for this visit.      Subjective Assessment - 07/11/16 1703    Subjective R upper back pain is 7-8/10 currently with R arm symptoms.    Pertinent History S/P lumbar L4/L5 fusion on 03/13/2016. Last 2-3 weeks, pt started having muscle spasm in his R upper back. Started taking diazepam which is causing muscle weakness for his legs which caused him to use a cane. Had back surgery March 13, 2016 to fuse his L4/L5. Pt was walking well following the surgery until taking diazepam for his R upper back musle spams. Was doing well around June 14, 2016.  Has been taking diazepam for the past 2 weeks which helps with the pain. No known method of injury for his R upper back. Pt also states having a natural curvature at the middle of his back. Pt wife said that the patient might have been compensating which might have caused to R upper back pain. The surgery helped "big time" with his low back pain.  Denies bowel or bladder difficulties or saddle anesthesia.  Pt also states that he stopped taking diazepam since this morning. Now taking Tylenol.     Diagnostic tests Had not had imaging for his upper back and neck   Patient Stated Goals Pt expresses desire to get better.    Currently in Pain? Yes   Pain Score 8   7-8/10   Pain Onset More than a month ago             Objectives   High Volt E-stim to channel 1: R rhomboid muscle area at 160 V x  15 min for pain control.                                              Channel 2: R thoracic paraspinal muscles 110 V x 15 min for pain control  Decreased pain level to 4/10  while on E-stim     There-ex   part of the exercises performed while on Estim  Seated bilateral scapular retraction 3x, cues for proper technique  then with chin tucks 6x, cues for proper technique. Discomfort  Without E-stim  NuStep x 10 min, seat 12, arms 11, using L arm only and feet with emphasis/cues on pulling L hand back to activate L  rhomboid muscles; heating pad at R shoulder/rhomboid area.   Pt and wife education during NuStep on radial nerve distribution, L rotation around T1 area (possible involevment  with pain) and purpose of the aforementioned exercise. Visuals and spine model utilized.   Pt education on keeping his head in neutral position when sleeping on his side (with pillows between his knees) to help with neck pain.    Improved  exercise technique, movement at target joints, use of target muscles after mod verbal, visual, tactile cues.                         PT Education - 07/11/16 1725    Education provided Yes   Education Details Ther-ex, radial nerve, and possible lower cervical, upper thoracic spine and rhomboid muscle involvement   Person(s) Educated Patient   Methods Explanation;Demonstration;Tactile cues;Verbal cues   Comprehension Verbalized understanding;Returned demonstration             PT Long Term Goals - 07/02/16 1950      PT LONG TERM GOAL #1   Title Patient will have a decrease in R upper back/thoracic/scapular pain to 4/10 or less at worst to promote ability to turn his head.    Baseline 8-9/10 at worst   Time 6   Period Weeks   Status New     PT LONG TERM GOAL #2   Title Patient will have a decrease in low back pain to 2/10 or less at worst to promote ability to sit for longer periods and perform sitting tasks   Baseline 3-4/10   Time 6   Period Weeks   Status New     PT LONG TERM GOAL #3   Title Patient will improve his Modified Oswestry Low Back Pain Disablity Questionnaire score by at least 6 points as a demonstration of improved function.    Baseline 68%   Time 6   Period Weeks   Status New     PT LONG TERM GOAL #4   Title Patient will be able to ambulate at least 500 ft independently and without LOB to promote mobility.    Baseline currently ambulates with hand held assist from wife or uses a [facility]   Time 6   Period Weeks   Status New               Plan - 07/11/16 1717    Clinical Impression Statement Decreased pain while on E-stim. No increase in symptoms with heating pad on R shoulder while performing the NuStep machine. L rotation palpated around T1 area which maybe contributing to his symptoms.    Rehab Potential Good   Clinical  Impairments Affecting Rehab Potential pain, weakness, potential fall risk   PT Frequency 2x / week   PT  Duration 6 weeks   PT Treatment/Interventions Therapeutic activities;Therapeutic exercise;Manual techniques;Aquatic Therapy;Electrical Stimulation;Iontophoresis 4mg /ml Dexamethasone;Neuromuscular re-education;Balance training;Gait training;Moist Heat;Ultrasound;Patient/family education;Dry needling   PT Next Visit Plan High Volt E-stim, modalities PRN as needed for pain, LE and scapular strengthening, posture   Consulted and Agree with Plan of Care Patient;Family member/caregiver   Family Member Consulted wife      Patient will benefit from skilled therapeutic intervention in order to improve the following deficits and impairments:  Pain, Abnormal gait, Decreased strength, Improper body mechanics, Postural dysfunction, Difficulty walking, Decreased balance  Visit Diagnosis: Bilateral low back pain without sciatica  Cervicalgia  Pain in thoracic spine     Problem List Patient Active Problem List   Diagnosis Date Noted  . Degenerative spondylolisthesis 03/13/2016    Loralyn Freshwater PT, DPT   07/11/2016, 8:19 PM  [place] [facility] SPORTS MEDICINE [address] [place], [address] Phone: [phone]   Fax:  [phone]  Name: Albert [name] MRN: [id number] Date of Birth: 10/18/1946

## 2016-07-17 ENCOUNTER — Ambulatory Visit: Payer: Medicare Other | Attending: Neurosurgery

## 2016-07-17 DIAGNOSIS — M545 Low back pain, unspecified: Secondary | ICD-10-CM

## 2016-07-17 DIAGNOSIS — M542 Cervicalgia: Secondary | ICD-10-CM | POA: Diagnosis not present

## 2016-07-17 DIAGNOSIS — M546 Pain in thoracic spine: Secondary | ICD-10-CM | POA: Insufficient documentation

## 2016-07-17 NOTE — Patient Instructions (Signed)
Strengthening: Resisted Extension    Hold yellow band in left hand, arm forward. Pull arm back, elbow straight.   Repeat ___10_ times per set. Do _3___ sets per session. Do _1__ sessions per day.  http://orth.exer.us/832   Copyright  VHI. All rights reserved.        Turn your head comfortably to the left 10 times. Perform 3 sets daily.

## 2016-07-17 NOTE — Therapy (Signed)
[facility] [address] [place], [address] Phone: [phone]   Fax:  [phone]  Physical Therapy Treatment  Patient Details  Name: Albert [name] MRN: [id number] Date of Birth: 07-30-1946 Referring Provider: Sherilyn Cooter A. Pool, MD  Encounter Date: 07/17/2016      PT End of Session - 07/17/16 1621    Visit Number 5   Number of Visits 13   Date for PT Re-Evaluation 08/16/16   Authorization Type 5   Authorization Time Period of 10   PT Start Time 1621   PT Stop Time 1711   PT Time Calculation (min) 50 min   Activity Tolerance Patient tolerated treatment well   Behavior During Therapy WFL for tasks assessed/performed      Past Medical History:  Diagnosis Date  . Anxiety    takes Xanax daily as needed  . Arthritis   . Back pain   . Chronic back pain   . Diverticulosis   . GERD (gastroesophageal reflux disease)    OTC meds as needed  . History of colon polyps    benign   . History of kidney stones   . History of migraine    takes Imitrex daily as needed.Last one 2 wks ago  . Hyperlipidemia    hx of-has been off of meds for about a yr  . Insomnia    only recent d/t pain  . Joint pain     Past Surgical History:  Procedure Laterality Date  . ANKLE SURGERY Right 1997   ankles/screws   . ARTHROSCOPIC REPAIR ACL Right 1993  . COLONOSCOPY    . ESOPHAGOGASTRODUODENOSCOPY    . LUMBAR FUSION  March 13, 2016   L4/L5 fusion  . TONSILLECTOMY  1962   adenoidectomy   . VASECTOMY  1973    There were no vitals filed for this visit.      Subjective Assessment - 07/17/16 1623    Subjective R UE feels worse. Feels symptoms going down R arm to his 5th digit (ulnar nerve distribution). 6/10 R shoulder currently. 4/10 R UE area. Has not had any imaging for his neck yet.    Pertinent History S/P lumbar L4/L5 fusion on 03/13/2016. Last 2-3 weeks, pt started having muscle spasm in his R upper back.  Started taking diazepam which is causing muscle weakness for his legs which caused him to use a cane. Had back surgery March 13, 2016 to fuse his L4/L5. Pt was walking well following the surgery until taking diazepam for his R upper back musle spams. Was doing well around June 14, 2016.  Has been taking diazepam for the past 2 weeks which helps with the pain. No known method of injury for his R upper back. Pt also states having a natural curvature at the middle of his back. Pt wife said that the patient might have been compensating which might have caused to R upper back pain. The surgery helped "big time" with his low back pain.  Denies bowel or bladder difficulties or saddle anesthesia.  Pt also states that he stopped taking diazepam since this morning. Now taking Tylenol.     Diagnostic tests Had not had imaging for his upper back and neck   Patient Stated Goals Pt expresses desire to get better.    Currently in Pain? Yes   Pain Score 6    Pain Onset More than a month ago  [facility] PT Assessment - 07/17/16 1626      Observation/Other Assessments   Observations Attempted performing VBI test but unable to clear R side secondary to pain.         Objectives  There-ex  Attempted to clear VBI for certain manual therapy techniques but unable to do so due to pain.    Directed patient with seated L cervical rotation 10x3. Decreased neck pain.  standing L shoulder extension resisting yellow band 10x3  Ball wall push-ups 5x. Increased symptoms  L shoulder IR resisting yellow band 10x3    Decreased R UE symptoms   Seated biceps curls 10x3 with L UE,   Standing L triceps extension resisting yellow band 10x     Improved exercise technique, movement at target joints, use of target muscles after mod verbal, visual, tactile cues.    High Volt E-stim R UT and upper thoracic paraspinal muscle 135 V for pain control.  No R upper back/shoulder pain afterwards.    Decreased R UE  symptoms and decreased R upper back/shoulder pain after exercises promoting use of L rhomboid muscles to decrease L rotation of upper thoracic and lower cervical spine. No R upper back/shoulder or UE pain or symptoms after session.                        PT Education - 07/17/16 1705    Education provided Yes   Education Details ther-ex, HEP   Person(s) Educated Patient   Methods Explanation;Demonstration;Tactile cues;Verbal cues;Handout   Comprehension Verbalized understanding;Returned demonstration             PT Long Term Goals - 07/02/16 1950      PT LONG TERM GOAL #1   Title Patient will have a decrease in R upper back/thoracic/scapular pain to 4/10 or less at worst to promote ability to turn his head.    Baseline 8-9/10 at worst   Time 6   Period Weeks   Status New     PT LONG TERM GOAL #2   Title Patient will have a decrease in low back pain to 2/10 or less at worst to promote ability to sit for longer periods and perform sitting tasks   Baseline 3-4/10   Time 6   Period Weeks   Status New     PT LONG TERM GOAL #3   Title Patient will improve his Modified Oswestry Low Back Pain Disablity Questionnaire score by at least 6 points as a demonstration of improved function.    Baseline 68%   Time 6   Period Weeks   Status New     PT LONG TERM GOAL #4   Title Patient will be able to ambulate at least 500 ft independently and without LOB to promote mobility.    Baseline currently ambulates with hand held assist from wife or uses a [facility]   Time 6   Period Weeks   Status New               Plan - 07/17/16 1704    Clinical Impression Statement Decreased R UE symptoms and decreased R upper back/shoulder pain after exercises promoting use of L rhomboid muscles to decrease L rotation of upper thoracic and lower cervical spine. No R upper back/shoulder or UE pain or symptoms after session.    Rehab Potential Good   Clinical Impairments Affecting Rehab  Potential pain, weakness, potential fall risk   PT Frequency 2x / week   PT  Duration 6 weeks   PT Treatment/Interventions Therapeutic activities;Therapeutic exercise;Manual techniques;Aquatic Therapy;Electrical Stimulation;Iontophoresis /ml Dexamethasone;Neuromuscular re-education;Balance training;Gait training;Moist Heat;Ultrasound;Patient/family education;Dry needling   PT Next Visit Plan High Volt E-stim, modalities PRN as needed for pain, LE and scapular strengthening, posture   Consulted and Agree with Plan of Care Patient;Family member/caregiver   Family Member Consulted wife      Patient will benefit from skilled therapeutic intervention in order to improve the following deficits and impairments:  Pain, Abnormal gait, Decreased strength, Improper body mechanics, Postural dysfunction, Difficulty walking, Decreased balance  Visit Diagnosis: Cervicalgia  Bilateral low back pain without sciatica  Pain in thoracic spine     Problem List Patient Active Problem List   Diagnosis Date Noted  . Degenerative spondylolisthesis 03/13/2016    Loralyn Freshwater PT, DPT   07/17/2016, 5:21 PM  [place] [facility] PHYSICAL AND SPORTS MEDICINE [address], [place], [address] Phone: [phone]   Fax:  [phone]  Name: Albert [name] MRN: [id number] Date of Birth: 13-Dec-1946

## 2016-07-19 ENCOUNTER — Ambulatory Visit: Payer: Medicare Other

## 2016-07-19 DIAGNOSIS — M542 Cervicalgia: Secondary | ICD-10-CM

## 2016-07-19 DIAGNOSIS — M545 Low back pain, unspecified: Secondary | ICD-10-CM

## 2016-07-19 DIAGNOSIS — M546 Pain in thoracic spine: Secondary | ICD-10-CM

## 2016-07-19 NOTE — Patient Instructions (Signed)
Seated L scapular retraction. Reviewed and given as part of his HEP. Pt to perform exercise as much as he can throughout the day, holding it as long as he can. Pt demonstrated and verbalized understanding.

## 2016-07-19 NOTE — Therapy (Signed)
[place] [facility] [address], [place], [address] Phone: [phone]   Fax:  [phone]  Physical Therapy Treatment  Patient Details  Name: Albert [name] MRN: [id number] Date of Birth: 03-05-1946 Referring Provider: Sherilyn Cooter A. Pool, MD  Encounter Date: 07/19/2016      PT End of Session - 07/19/16 1616    Visit Number 6   Number of Visits 13   Date for PT Re-Evaluation 08/16/16   Authorization Type 6   Authorization Time Period of 10   PT Start Time 1616   PT Stop Time 1731   PT Time Calculation (min) 75 min   Activity Tolerance Patient tolerated treatment well   Behavior During Therapy WFL for tasks assessed/performed      Past Medical History:  Diagnosis Date  . Anxiety    takes Xanax daily as needed  . Arthritis   . Back pain   . Chronic back pain   . Diverticulosis   . GERD (gastroesophageal reflux disease)    OTC meds as needed  . History of colon polyps    benign   . History of kidney stones   . History of migraine    takes Imitrex daily as needed.Last one 2 wks ago  . Hyperlipidemia    hx of-has been off of meds for about a yr  . Insomnia    only recent d/t pain  . Joint pain     Past Surgical History:  Procedure Laterality Date  . ANKLE SURGERY Right 1997   ankles/screws   . ARTHROSCOPIC REPAIR ACL Right 1993  . COLONOSCOPY    . ESOPHAGOGASTRODUODENOSCOPY    . LUMBAR FUSION  March 13, 2016   L4/L5 fusion  . TONSILLECTOMY  1962   adenoidectomy   . VASECTOMY  1973    There were no vitals filed for this visit.      Subjective Assessment - 07/19/16 1618    Subjective Pt states that his low back was bothering him when not using the seat cushion (pt brought his seat cushion). Also wearing his low back elastic brace. The R upper back hurts worst than it was 8-9/10. The relief after last session lasted about 6 hours. The tingling going down the R UE is not as bad as it was.    Pertinent History S/P lumbar L4/L5 fusion on 03/13/2016. Last 2-3 weeks, pt started having muscle spasm in his R upper back. Started taking diazepam which is causing muscle weakness for his legs which caused him to use a cane. Had back surgery March 13, 2016 to fuse his L4/L5. Pt was walking well following the surgery until taking diazepam for his R upper back musle spams. Was doing well around June 14, 2016.  Has been taking diazepam for the past 2 weeks which helps with the pain. No known method of injury for his R upper back. Pt also states having a natural curvature at the middle of his back. Pt wife said that the patient might have been compensating which might have caused to R upper back pain. The surgery helped "big time" with his low back pain.  Denies bowel or bladder difficulties or saddle anesthesia.  Pt also states that he stopped taking diazepam since this morning. Now taking Tylenol.     Diagnostic tests Had not had imaging for his upper back and neck   Patient Stated Goals Pt expresses desire to get better.    Currently in  Pain? Yes   Pain Score 9   8-9/10   Pain Onset More than a month ago            [facility] PT Assessment - 07/19/16 1623      Assessment   Next MD Visit 07/26/2016     Objectives  There-ex   Directed patient with standing L shoulder extension resistng yellow band 10x3 Standing L shoulder horizontal abduction 10x, then 7x Seated L scapular retraction 10x3 Seated L cervical side bend stretch 5x 5 seconds  Then with L cervical rotation 5x5 seconds Seated manually resisted L cervical side bend (gentle) 5x5 seconds   Standing glute max squeeze 10x with 5 second holds, then 8x with 5 second holds   Performed while pt on E-stim:    seated transversus abdominis with pelvic floor contraction 2x5 seconds   Then with hip adductor ball (small physioball) squeeze 10x5 seconds  Seated L scapular retraction 5x, then 2x with 5 second holds  Reviewed and given as  part of his HEP. Pt to perform exercise as much as he can throughout the day, holding it as long as he can. Pt demonstrated and verbalized understanding.     Improved exercise technique, movement at target joints, use of target muscles after mod verbal, visual, tactile cues.     [place] E-stim to L rhomboid muscles 18.5 mA 10 seconds on, 20 seconds off x 15 minutes to promote more neutral lower cervical, and upper thoracic position.   No R UE symptoms during electrical stimulation.   Pt states 7-8/10 R upper back/thoracic spine pain level afterwards   High volt E-stim to R thoracic/upper back area 115 V for pain control afterwards x 15 min   Slight decreased R upper back/thoracic discomfort with L cervical side bend as well as with L cervical rotation. Fair tolerance to other exercises with some increase in symptoms. Added [place] E-stim to L rhomboid muscles to promote strength and hopefully more neutral positioning of lower cervical and upper thoracic spine. L rotation around T2 palpated. Slight R rotation (more neutral position) at that area during [place] stimulation to L rhomboid muscles. Slight decrease in symptoms per pain scale provided by pt but pt states it feels about the same. Used High Volt E-stim to R side afterwards for pain control.                    PT Education - 07/19/16 1738    Education provided Yes   Education Details ther-ex, HEP   Person(s) Educated Patient;Spouse   Methods Explanation;Demonstration;Tactile cues   Comprehension Returned demonstration;Verbalized understanding             PT Long Term Goals - 07/02/16 1950      PT LONG TERM GOAL #1   Title Patient will have a decrease in R upper back/thoracic/scapular pain to 4/10 or less at worst to promote ability to turn his head.    Baseline 8-9/10 at worst   Time 6   Period Weeks   Status New     PT LONG TERM GOAL #2   Title Patient will have a decrease in low back pain to 2/10 or less  at worst to promote ability to sit for longer periods and perform sitting tasks   Baseline 3-4/10   Time 6   Period Weeks   Status New     PT LONG TERM GOAL #3   Title Patient will improve his Modified Oswestry Low Back Pain Disablity  Questionnaire score by at least 6 points as a demonstration of improved function.    Baseline 68%   Time 6   Period Weeks   Status New     PT LONG TERM GOAL #4   Title Patient will be able to ambulate at least 500 ft independently and without LOB to promote mobility.    Baseline currently ambulates with hand held assist from wife or uses a [facility]   Time 6   Period Weeks   Status New               Plan - 07/19/16 1730    Clinical Impression Statement Slight decreased R upper back/thoracic discomfort with L cervical side bend as well as with L cervical rotation. Fair tolerance to other exercises with some increase in symptoms. Added [place] E-stim to L rhomboid muscles to promote strength and hopefully more neutral positioning of lower cervical and upper thoracic spine. L rotation around T2 palpated. Slight R rotation (more neutral position) at that area during [place] stimulation to L rhomboid muscles. Slight decrease in symptoms per pain scale provided by pt but pt states it feels about the same. Used High Volt E-stim to R side afterwards for pain control.     Rehab Potential Good   Clinical Impairments Affecting Rehab Potential pain, weakness, potential fall risk   PT Frequency 2x / week   PT Duration 6 weeks   PT Treatment/Interventions Therapeutic activities;Therapeutic exercise;Manual techniques;Aquatic Therapy;Electrical Stimulation;Iontophoresis 4mg /ml Dexamethasone;Neuromuscular re-education;Balance training;Gait training;Moist Heat;Ultrasound;Patient/family education;Dry needling   PT Next Visit Plan High Volt E-stim, modalities PRN as needed for pain, LE and scapular strengthening, posture   Consulted and Agree with Plan of Care Patient;Family  member/caregiver   Family Member Consulted wife      Patient will benefit from skilled therapeutic intervention in order to improve the following deficits and impairments:  Pain, Abnormal gait, Decreased strength, Improper body mechanics, Postural dysfunction, Difficulty walking, Decreased balance  Visit Diagnosis: Bilateral low back pain without sciatica  Cervicalgia  Pain in thoracic spine     Problem List Patient Active Problem List   Diagnosis Date Noted  . Degenerative spondylolisthesis 03/13/2016    Loralyn Freshwater PT, DPT   07/19/2016, 5:40 PM  [facility] PHYS[facility] AND SPORTS MEDICINE [address], [place], [address] Phone: [phone]   Fax:  [phone]  Name: Lindsey Hommel. MRN: [id number] Date of Birth: 01-May-1946

## 2016-07-24 ENCOUNTER — Ambulatory Visit: Payer: Medicare Other

## 2016-07-24 DIAGNOSIS — M542 Cervicalgia: Secondary | ICD-10-CM | POA: Diagnosis not present

## 2016-07-24 DIAGNOSIS — M546 Pain in thoracic spine: Secondary | ICD-10-CM

## 2016-07-24 DIAGNOSIS — M545 Low back pain, unspecified: Secondary | ICD-10-CM

## 2016-07-24 NOTE — Therapy (Signed)
[place] [facility]TS [facility] [address] [place], [address] Phone: [phone]   Fax:  [phone]  Physical Therapy Treatment And Progress Report  Patient Details  Name: Albert [name] MRN: [id number] Date of Birth: [birth year] Referring Provider: Sherilyn Cooter A. Pool, MD  Encounter Date: 07/24/2016      PT End of Session - 07/24/16 1621    Visit Number 7   Number of Visits 13   Date for PT Re-Evaluation 08/16/16   Authorization Type 1   Authorization Time Period of 10   PT Start Time 1621   PT Stop Time 1715   PT Time Calculation (min) 54 min   Activity Tolerance Patient tolerated treatment well   Behavior During Therapy WFL for tasks assessed/performed      Past Medical History:  Diagnosis Date  . Anxiety    takes Xanax daily as needed  . Arthritis   . Back pain   . Chronic back pain   . Diverticulosis   . GERD (gastroesophageal reflux disease)    OTC meds as needed  . History of colon polyps    benign   . History of kidney stones   . History of migraine    takes Imitrex daily as needed.Last one 2 wks ago  . Hyperlipidemia    hx of-has been off of meds for about a yr  . Insomnia    only recent d/t pain  . Joint pain     Past Surgical History:  Procedure Laterality Date  . ANKLE SURGERY Right 1997   ankles/screws   . ARTHROSCOPIC REPAIR ACL Right 1993  . COLONOSCOPY    . ESOPHAGOGASTRODUODENOSCOPY    . LUMBAR FUSION  March 13, 2016   L4/L5 fusion  . TONSILLECTOMY  1962   adenoidectomy   . VASECTOMY  1973    There were no vitals filed for this visit.      Subjective Assessment - 07/24/16 1622    Subjective Pt states that his low back hurts, wearing his back brace more. 8-9/10 low back. 7/10 R upper back with R arm symptoms. Pt wife also states that he had increased bilateral hip and knee (joint) pain from last session's hip exercises.  Pt adds that wearing the brace around his pelvis helps.     Pertinent History S/P lumbar L4/L5 fusion on 03/13/2016. Last 2-3 weeks, pt started having muscle spasm in his R upper back. Started taking diazepam which is causing muscle weakness for his legs which caused him to use a cane. Had back surgery March 13, 2016 to fuse his L4/L5. Pt was walking well following the surgery until taking diazepam for his R upper back musle spams. Was doing well around June 14, 2016.  Has been taking diazepam for the past 2 weeks which helps with the pain. No known method of injury for his R upper back. Pt also states having a natural curvature at the middle of his back. Pt wife said that the patient might have been compensating which might have caused to R upper back pain. The surgery helped "big time" with his low back pain.  Denies bowel or bladder difficulties or saddle anesthesia.  Pt also states that he stopped taking diazepam since this morning. Now taking Tylenol.     Diagnostic tests Had not had imaging for his upper back and neck   Patient Stated Goals Pt expresses desire to get better.    Currently in Pain? Yes  Pain Score 9   7/10 R upper back, 8-9/10 lower back   Pain Onset More than a month ago         Objectives   There-ex   Directed patient with seated transversus abdominis contraction multiple times  Low back pain with addition of pelvic floor contraction.  Seated alternating knee extension 5x each LE, max cues for pain free range of motion for knees and low back.   Sitting up straight with proper posture (slight decreased back pain. Increased R upper back pain secondary to R shoulder shrug movement)  Standing with L scapular retraction 10x to promote L rhomboid use  Forward step up onto Air Ex pad 1x each LE with one UE assist.  Bilateral hip discomfort.   Seated alternating hip extension isometrics 4x each LE. Increased low back discomfort.     Improved exercise technique, movement at target joints, use of target muscles after mod  verbal, visual, tactile cues.     High Volt E-stim to R UT area at 115 V x 15 min for pain control. Decreased R upper back/shoulder pain to 6/10 after e-stim.                     PT Education - 07/24/16 1912    Education provided Yes   Education Details ther-ex   Starwood HotelsPerson(s) Educated Patient   Methods Explanation;Demonstration;Tactile cues;Verbal cues   Comprehension Verbalized understanding;Returned demonstration             PT Long Term Goals - 07/24/16 1914      PT LONG TERM GOAL #1   Title Patient will have a decrease in R upper back/thoracic/scapular pain to 4/10 or less at worst to promote ability to turn his head.    Baseline 8-9/10 at worst   Time 6   Period Weeks   Status On-going     PT LONG TERM GOAL #2   Title Patient will have a decrease in low back pain to 2/10 or less at worst to promote ability to sit for longer periods and perform sitting tasks   Baseline 3-4/10   Time 6   Period Weeks   Status On-going     PT LONG TERM GOAL #3   Title Patient will improve his Modified Oswestry Low Back Pain Disablity Questionnaire score by at least 6 points as a demonstration of improved function.    Baseline 68%   Time 6   Period Weeks   Status On-going     PT LONG TERM GOAL #4   Title Patient will be able to ambulate at least 500 ft independently and without LOB to promote mobility.    Baseline currently ambulates with hand held assist from [facility]; Able to ambulate short distances independently (07/24/2016)   Time 6   Period Weeks   Status On-going               Plan - 07/24/16 1701    Clinical Impression Statement Pt demonstrates weakness with core strength and poor lumbar posture. Decreased low back pain with proper lumbar posture but bilateral hip pain increases. Slight decrease in L rotation around his lower cervical and upper thoracic spine compared to previous sessions. Difficulty with exercise performance and progression  secondary to multiple pain areas (R upper back/thoracic/scapular area, R arm, low back, bilateral hips, bilateral knees) as well as difficulty tolerating one position at a time due to back pain. Decreased R upper back/thoracic/scapular area pain  after High Volt E-stim use for pain control. Some improvement with R upper back/thoracic/scapular symptoms with modalities and use of L scapular muscles as tolerated. Patient will benefit from continued skilled physical therapy services to help decrease pain and increase function.   Rehab Potential Good   Clinical Impairments Affecting Rehab Potential pain, weakness, potential fall risk, bilateral hip and knee pain.    PT Frequency 2x / week   PT Duration 6 weeks   PT Treatment/Interventions Therapeutic activities;Therapeutic exercise;Manual techniques;Aquatic Therapy;Electrical Stimulation;Iontophoresis 4mg /ml Dexamethasone;Neuromuscular re-education;Balance training;Gait training;Moist Heat;Ultrasound;Patient/family education;Dry needling   PT Next Visit Plan High Volt E-stim, modalities PRN as needed for pain, LE and scapular strengthening, posture   Consulted and Agree with Plan of Care Patient;Family member/caregiver   Family Member Consulted wife      Patient will benefit from skilled therapeutic intervention in order to improve the following deficits and impairments:  Pain, Abnormal gait, Decreased strength, Improper body mechanics, Postural dysfunction, Difficulty walking, Decreased balance  Visit Diagnosis: Bilateral low back pain without sciatica  Cervicalgia  Pain in thoracic spine       G-Codes - 07/27/16 1925    Functional Assessment Tool Used clinical presentation, patient interview   Functional Limitation Mobility: Walking and moving around   Mobility: Walking and Moving Around Current Status [phone]) At least 60 percent but less than 80 percent impaired, limited or restricted   Mobility: Walking and Moving Around Goal Status [phone])  At least 20 percent but less than 40 percent impaired, limited or restricted      Problem List Patient Active Problem List   Diagnosis Date Noted  . Degenerative spondylolisthesis 03/13/2016   Thank you for your referral.   Loralyn Freshwater PT, DPT   July 27, 2016, 7:28 PM  [facility] PHYSICAL AND SPORTS MEDICINE [address] [place], [address] Phone: [phone]   Fax:  [phone]  Name: Albert [name] MRN: [id number] Date of Birth: Jul 22, 1946

## 2016-07-26 ENCOUNTER — Ambulatory Visit: Payer: Medicare Other

## 2016-07-26 DIAGNOSIS — M545 Low back pain, unspecified: Secondary | ICD-10-CM

## 2016-07-26 DIAGNOSIS — M542 Cervicalgia: Secondary | ICD-10-CM

## 2016-07-26 DIAGNOSIS — M546 Pain in thoracic spine: Secondary | ICD-10-CM

## 2016-07-26 NOTE — Therapy (Signed)
[place] [facility] [address] [place], [address] Phone: [phone]   Fax:  [phone]  Physical Therapy Treatment  Patient Details  Name: Linard Daft. MRN: [id number] Date of Birth: [birth year] Referring Provider: Sherilyn Cooter A. Pool, MD  Encounter Date: 07/26/2016      PT End of Session - 07/26/16 1619    Visit Number 8   Number of Visits 13   Date for PT Re-Evaluation 08/16/16   Authorization Type 2   Authorization Time Period of 10   PT Start Time 1618   PT Stop Time 1707   PT Time Calculation (min) 49 min   Activity Tolerance Patient tolerated treatment well   Behavior During Therapy WFL for tasks assessed/performed      Past Medical History:  Diagnosis Date  . Anxiety    takes Xanax daily as needed  . Arthritis   . Back pain   . Chronic back pain   . Diverticulosis   . GERD (gastroesophageal reflux disease)    OTC meds as needed  . History of colon polyps    benign   . History of kidney stones   . History of migraine    takes Imitrex daily as needed.Last one 2 wks ago  . Hyperlipidemia    hx of-has been off of meds for about a yr  . Insomnia    only recent d/t pain  . Joint pain     Past Surgical History:  Procedure Laterality Date  . ANKLE SURGERY Right 1997   ankles/screws   . ARTHROSCOPIC REPAIR ACL Right 1993  . COLONOSCOPY    . ESOPHAGOGASTRODUODENOSCOPY    . LUMBAR FUSION  March 13, 2016   L4/L5 fusion  . TONSILLECTOMY  1962   adenoidectomy   . VASECTOMY  1973    There were no vitals filed for this visit.      Subjective Assessment - 07/26/16 1621    Subjective Pt states that the MD appointment was cancelled by the office this morning. The low back is better. The R upper back/shoulder is bothering him a lot. Pt states just taking tylenol for pain.  Did not take tylenol before PT to show how much pain he is in.    Pertinent History S/P lumbar L4/L5 fusion on 03/13/2016.  Last 2-3 weeks, pt started having muscle spasm in his R upper back. Started taking diazepam which is causing muscle weakness for his legs which caused him to use a cane. Had back surgery March 13, 2016 to fuse his L4/L5. Pt was walking well following the surgery until taking diazepam for his R upper back musle spams. Was doing well around June 14, 2016.  Has been taking diazepam for the past 2 weeks which helps with the pain. No known method of injury for his R upper back. Pt also states having a natural curvature at the middle of his back. Pt wife said that the patient might have been compensating which might have caused to R upper back pain. The surgery helped "big time" with his low back pain.  Denies bowel or bladder difficulties or saddle anesthesia.  Pt also states that he stopped taking diazepam since this morning. Now taking Tylenol.     Diagnostic tests Had not had imaging for his upper back and neck   Patient Stated Goals Pt expresses desire to get better.    Currently in Pain? Yes   Pain Score 10-Worst pain ever  9-10 R upper back, 0/10 low back currently    Pain Onset More than a month ago             Objectives   There-ex  Directed patient with seated gentle manually resisted L cervical side bend isometrics 5x 3 seconds, then 4x 3 seconds.   Performed while on E-stim: Seated L cervical rotation 10x within the pain free range Seated L shoulder IR resisting yellow band 10x2  Seated gentle cervical flexion 5x,   Seated gentle chin tucks 5x, then 5x5 seconds    Improved exercise technique, movement at target joints, use of target muscles after mod verbal, visual, tactile cues.     High Volt E-stim to R lower cervical spine and UT x 30 min at 120 V for pain control with the heating pad    Majority of time spent using E-stim for pain control. Difficulty bringing pain level down today using the E-stim modality. Slight decrease symptoms with gentle cervical flexion.  Difficulty performing exercises due to pain level.                PT Education - 07/26/16 1641    Education provided Yes   Education Details ther-ex   [employer] HotelsPerson(s) Educated Patient   Methods Explanation;Demonstration;Tactile cues;Verbal cues   Comprehension Verbalized understanding;Returned demonstration             PT Long Term Goals - 07/24/16 1914      PT LONG TERM GOAL #1   Title Patient will have a decrease in R upper back/thoracic/scapular pain to 4/10 or less at worst to promote ability to turn his head.    Baseline 8-9/10 at worst   Time 6   Period Weeks   Status On-going     PT LONG TERM GOAL #2   Title Patient will have a decrease in low back pain to 2/10 or less at worst to promote ability to sit for longer periods and perform sitting tasks   Baseline 3-4/10   Time 6   Period Weeks   Status On-going     PT LONG TERM GOAL #3   Title Patient will improve his Modified Oswestry Low Back Pain Disablity Questionnaire score by at least 6 points as a demonstration of improved function.    Baseline 68%   Time 6   Period Weeks   Status On-going     PT LONG TERM GOAL #4   Title Patient will be able to ambulate at least 500 ft independently and without LOB to promote mobility.    Baseline currently ambulates with hand held assist from wife or [facility] a [facility]; Able to ambulate short distances independently (07/24/2016)   Time 6   Period Weeks   Status On-going               Plan - 07/26/16 1708    Clinical Impression Statement Majority of time spent using E-stim for pain control. Difficulty bringing pain level down today using the E-stim modality. Slight decrease symptoms with gentle cervical flexion. Difficulty performing exercises due to pain level.   Rehab Potential Good   Clinical Impairments Affecting Rehab Potential pain, weakness, potential fall risk, bilateral hip and knee pain.    PT Frequency 2x / week   PT Duration 6 weeks   PT  Treatment/Interventions Therapeutic activities;Therapeutic exercise;Manual techniques;Aquatic Therapy;Electrical Stimulation;Iontophoresis 4mg /ml Dexamethasone;Neuromuscular re-education;Balance training;Gait training;Moist Heat;Ultrasound;Patient/family education;Dry needling   PT Next Visit Plan High Volt E-stim, modalities PRN as needed for pain, LE and  scapular strengthening, posture   Consulted and Agree with Plan of Care Patient;Family member/caregiver   Family Member Consulted wife      Patient will benefit from skilled therapeutic intervention in order to improve the following deficits and impairments:  Pain, Abnormal gait, Decreased strength, Improper body mechanics, Postural dysfunction, Difficulty walking, Decreased balance  Visit Diagnosis: Bilateral low back pain without sciatica  Cervicalgia  Pain in thoracic spine     Problem List Patient Active Problem List   Diagnosis Date Noted  . Degenerative spondylolisthesis 03/13/2016    Loralyn Freshwater PT, DPT   07/26/2016, 7:15 PM  [place] [facility] PHYSICAL AND SPORTS MEDICINE [address], [place], [address] Phone: [phone]   Fax:  [phone]  Name: Eluterio Seymour. MRN: [id number] Date of Birth: 1946/11/30

## 2016-08-01 ENCOUNTER — Ambulatory Visit: Payer: Medicare Other | Admitting: Physical Therapy

## 2016-08-01 DIAGNOSIS — M545 Low back pain, unspecified: Secondary | ICD-10-CM

## 2016-08-01 DIAGNOSIS — M546 Pain in thoracic spine: Secondary | ICD-10-CM

## 2016-08-01 DIAGNOSIS — M542 Cervicalgia: Secondary | ICD-10-CM | POA: Diagnosis not present

## 2016-08-01 NOTE — Therapy (Signed)
[facility] [address] [place], [address] Phone: [phone]   Fax:  [phone]  Physical Therapy Treatment  Patient Details  Name: Albert [name] MRN: [id number] Date of Birth: [birth year] Referring Provider: Sherilyn Cooter A. Pool, MD  Encounter Date: 08/01/2016      PT End of Session - 08/01/16 1558    Visit Number 9   Number of Visits 13   Date for PT Re-Evaluation 08/16/16   Authorization Type 3   Authorization Time Period of 10   PT Start Time 1520   PT Stop Time 1608   PT Time Calculation (min) 48 min   Activity Tolerance Patient tolerated treatment well   Behavior During Therapy WFL for tasks assessed/performed      Past Medical History:  Diagnosis Date  . Anxiety    takes Xanax daily as needed  . Arthritis   . Back pain   . Chronic back pain   . Diverticulosis   . GERD (gastroesophageal reflux disease)    OTC meds as needed  . History of colon polyps    benign   . History of kidney stones   . History of migraine    takes Imitrex daily as needed.Last one 2 wks ago  . Hyperlipidemia    hx of-has been off of meds for about a yr  . Insomnia    only recent d/t pain  . Joint pain     Past Surgical History:  Procedure Laterality Date  . ANKLE SURGERY Right 1997   ankles/screws   . ARTHROSCOPIC REPAIR ACL Right 1993  . COLONOSCOPY    . ESOPHAGOGASTRODUODENOSCOPY    . LUMBAR FUSION  March 13, 2016   L4/L5 fusion  . TONSILLECTOMY  1962   adenoidectomy   . VASECTOMY  1973    There were no vitals filed for this visit.      Subjective Assessment - 08/01/16 1547    Subjective Pt reports his low back is doing okay but surgical site is still tender. R upper back isn't too bad today.   Pertinent History S/P lumbar L4/L5 fusion on 03/13/2016. Last 2-3 weeks, pt started having muscle spasm in his R upper back. Started taking diazepam which is causing muscle weakness for his legs which  caused him to use a cane. Had back surgery March 13, 2016 to fuse his L4/L5. Pt was walking well following the surgery until taking diazepam for his R upper back musle spams. Was doing well around June 14, 2016.  Has been taking diazepam for the past 2 weeks which helps with the pain. No known method of injury for his R upper back. Pt also states having a natural curvature at the middle of his back. Pt wife said that the patient might have been compensating which might have caused to R upper back pain. The surgery helped "big time" with his low back pain.  Denies bowel or bladder difficulties or saddle anesthesia.  Pt also states that he stopped taking diazepam since this morning. Now taking Tylenol.     Diagnostic tests Had not had imaging for his upper back and neck   Patient Stated Goals Pt expresses desire to get better.    Currently in Pain? Yes   Pain Score 2   R upper back   Pain Type Acute pain;Surgical pain   Pain Onset More than a month ago   Pain Frequency Constant      Palpated  R upper back for tenderness and stiffness with pt in seated position.  There-ex Attempted seated thoracic extension over towel roll for increased ROM, positioning uncomfortable and stopped.  Reclined hooklying: TA contraction with light march 2x10 TA contraction with hip fallout 2x10 TA contraction with side to side 2x10 Hip add ball squeezes 2x10  Cues for proper technique to target specific muscles, gentle ROM, slow eccentric contractions for most effective strengthening. Monitored pt's tolerance throughout. Frequent therapeutic rest breaks for energy conservation and to prevent increased pain.   E-stim (attended):  High Volt E-stim to R UT area at 120 V and R UT at 95 V x 10 min for pain control. Pt reports reduced cramping afterwards. Pt monitored throughout.                           PT Education - 08/01/16 1557    Education provided Yes   Education Details posture    Person(s) Educated Patient   Methods Explanation;Demonstration   Comprehension Verbalized understanding;Returned demonstration             PT Long Term Goals - 07/24/16 1914      PT LONG TERM GOAL #1   Title Patient will have a decrease in R upper back/thoracic/scapular pain to 4/10 or less at worst to promote ability to turn his head.    Baseline 8-9/10 at worst   Time 6   Period Weeks   Status On-going     PT LONG TERM GOAL #2   Title Patient will have a decrease in low back pain to 2/10 or less at worst to promote ability to sit for longer periods and perform sitting tasks   Baseline 3-4/10   Time 6   Period Weeks   Status On-going     PT LONG TERM GOAL #3   Title Patient will improve his Modified Oswestry Low Back Pain Disablity Questionnaire score by at least 6 points as a demonstration of improved function.    Baseline 68%   Time 6   Period Weeks   Status On-going     PT LONG TERM GOAL #4   Title Patient will be able to ambulate at least 500 ft independently and without LOB to promote mobility.    Baseline currently ambulates with hand held assist from wife or [facility] a [facility]; Able to ambulate short distances independently (07/24/2016)   Time 6   Period Weeks   Status On-going               Plan - 08/01/16 1559    Clinical Impression Statement Pt was able to tolerate very gentle core and LE strengthening exercises this session. Pt instructed to perform within small motions to prevent irritation of back. Positioning remains difficult. R upper trap presents tight and thoracic spine extension limited. Pt may benefit form stretching exercises. He will benefit from continued core and LE strengthening as tolerated to progress towards PLOF.   Rehab Potential Good   Clinical Impairments Affecting Rehab Potential pain, weakness, potential fall risk, bilateral hip and knee pain.    PT Frequency 2x / week   PT Duration 6 weeks   PT Treatment/Interventions Therapeutic  activities;Therapeutic exercise;Manual techniques;Aquatic Therapy;Electrical Stimulation;Iontophoresis 4mg /ml Dexamethasone;Neuromuscular re-education;Balance training;Gait training;Moist Heat;Ultrasound;Patient/family education;Dry needling   PT Next Visit Plan High Volt E-stim, modalities PRN as needed for pain, LE and scapular strengthening, posture   Consulted and Agree with Plan of Care Patient;Family member/caregiver   Family  Member Consulted wife      Patient will benefit from skilled therapeutic intervention in order to improve the following deficits and impairments:  Pain, Abnormal gait, Decreased strength, Improper body mechanics, Postural dysfunction, Difficulty walking, Decreased balance  Visit Diagnosis: Bilateral low back pain without sciatica  Cervicalgia  Pain in thoracic spine     Problem List Patient Active Problem List   Diagnosis Date Noted  . Degenerative spondylolisthesis 03/13/2016   Adelene IdlerMindy Jo Janit Cutter, PT, DPT  08/01/16, 4:13 PM [phone]  [facility] [address] [place], [place], [address] Phone: [phone]   Fax:  [phone]  Name: Albert PertRoger P Feldner Jr. MRN: [id number] Date of Birth: May 29, 1946

## 2016-08-07 ENCOUNTER — Ambulatory Visit: Payer: Medicare Other

## 2016-08-07 DIAGNOSIS — M545 Low back pain, unspecified: Secondary | ICD-10-CM

## 2016-08-07 DIAGNOSIS — M542 Cervicalgia: Secondary | ICD-10-CM | POA: Diagnosis not present

## 2016-08-07 DIAGNOSIS — M546 Pain in thoracic spine: Secondary | ICD-10-CM

## 2016-08-07 NOTE — Therapy (Signed)
[facility] [address], [place], [address] Phone: [phone]   Fax:  [phone]  Physical Therapy Treatment  Patient Details  Name: Albert [name] MRN: [id number] Date of Birth: 09-22-1946 Referring Provider: Sherilyn Cooter A. Pool, MD  Encounter Date: 08/07/2016      PT End of Session - 08/07/16 1646    Visit Number 10   Number of Visits 13   Date for PT Re-Evaluation 08/16/16   Authorization Type 4   Authorization Time Period of 10   PT Start Time 1646   PT Stop Time 1731   PT Time Calculation (min) 45 min   Activity Tolerance Patient tolerated treatment well   Behavior During Therapy WFL for tasks assessed/performed      Past Medical History:  Diagnosis Date  . Anxiety    takes Xanax daily as needed  . Arthritis   . Back pain   . Chronic back pain   . Diverticulosis   . GERD (gastroesophageal reflux disease)    OTC meds as needed  . History of colon polyps    benign   . History of kidney stones   . History of migraine    takes Imitrex daily as needed.Last one 2 wks ago  . Hyperlipidemia    hx of-has been off of meds for about a yr  . Insomnia    only recent d/t pain  . Joint pain     Past Surgical History:  Procedure Laterality Date  . ANKLE SURGERY Right 1997   ankles/screws   . ARTHROSCOPIC REPAIR ACL Right 1993  . COLONOSCOPY    . ESOPHAGOGASTRODUODENOSCOPY    . LUMBAR FUSION  March 13, 2016   L4/L5 fusion  . TONSILLECTOMY  1962   adenoidectomy   . VASECTOMY  1973    There were no vitals filed for this visit.      Subjective Assessment - 08/07/16 1648    Subjective Doing better. It's slow but gradual. Still can't sit up for very long. Tries to pad it (use the cushion). The low back is a lot better than it was. The R upper back is better as well. Saw the surgeon last week who said that the pain in the R upper back can happen. If it does not get better, then he might have  to take x-rays or do something else. Has not had as many bad days which is a big improvement.  0/10 low back, 3/10 R upper back currently.    Pertinent History S/P lumbar L4/L5 fusion on 03/13/2016. Last 2-3 weeks, pt started having muscle spasm in his R upper back. Started taking diazepam which is causing muscle weakness for his legs which caused him to use a cane. Had back surgery March 13, 2016 to fuse his L4/L5. Pt was walking well following the surgery until taking diazepam for his R upper back musle spams. Was doing well around June 14, 2016.  Has been taking diazepam for the past 2 weeks which helps with the pain. No known method of injury for his R upper back. Pt also states having a natural curvature at the middle of his back. Pt wife said that the patient might have been compensating which might have caused to R upper back pain. The surgery helped "big time" with his low back pain.  Denies bowel or bladder difficulties or saddle anesthesia.  Pt also states that he stopped taking diazepam since this morning. Now  taking Tylenol.     Diagnostic tests Had not had imaging for his upper back and neck   Patient Stated Goals Pt expresses desire to get better.    Currently in Pain? Yes   Pain Score 3   0/10 low back, 3/10 R upper back currently.    Pain Onset More than a month ago           Objectives  There-ex  Directed patient with seated transversus abdominis contraction with knee extension 5x3 each LE   Seated transversus abdominis contraction with hip flexion 5x3 (small movements to promote pelvic control; difficulty with R pelvic control [ipsilateral pelvic rotation] during R hip flexion)   Standing transversus abdominis contraction  With side stepping 6 ft to the L and 6 ft to the R with bilateral UE assist 5x with emphasis on lumbopelvic control  Then with alternating heel raise/knee flexion, emphasis on lumbopelvic control 5x3 each LE. Good pelvic control after min cues  Then with  heel raises 5x3   Then with gentle bilateral shoulder extension 5x3    Then with gentle bilateral shoulder ER 5x3   Then with gentle bilateral elbow flexion/extension 5x3    Then with standing mini squats (emphasis on hip and knee flexion) with bilateral UE assist.    Then with forward wedding march 20 ft x 2  Improved exercise technique, movement at target joints, use of target muscles after min to mod verbal, visual, tactile cues.   0/10 R upper back pain, but normal low back soreness after session. Pt also able to tolerate about half of the treatment standing. Better able to tolerate movement and exercises today.             PT Education - 08/07/16 1923    Education provided Yes   Education Details ther-ex   Starwood HotelsPerson(s) Educated Patient   Methods Explanation;Demonstration;Tactile cues;Verbal cues   Comprehension Verbalized understanding;Returned demonstration             PT Long Term Goals - 07/24/16 1914      PT LONG TERM GOAL #1   Title Patient will have a decrease in R upper back/thoracic/scapular pain to 4/10 or less at worst to promote ability to turn his head.    Baseline 8-9/10 at worst   Time 6   Period Weeks   Status On-going     PT LONG TERM GOAL #2   Title Patient will have a decrease in low back pain to 2/10 or less at worst to promote ability to sit for longer periods and perform sitting tasks   Baseline 3-4/10   Time 6   Period Weeks   Status On-going     PT LONG TERM GOAL #3   Title Patient will improve his Modified Oswestry Low Back Pain Disablity Questionnaire score by at least 6 points as a demonstration of improved function.    Baseline 68%   Time 6   Period Weeks   Status On-going     PT LONG TERM GOAL #4   Title Patient will be able to ambulate at least 500 ft independently and without LOB to promote mobility.    Baseline currently ambulates with hand held assist from wife or uses a [facility]; Able to ambulate short distances  independently (07/24/2016)   Time 6   Period Weeks   Status On-going               Plan - 08/07/16 1731    Clinical Impression Statement  0/10 R upper back pain, but normal low back soreness after session. Pt also able to tolerate about half of the treatment standing. Better able to tolerate movement and exercises today.    Rehab Potential Good   Clinical Impairments Affecting Rehab Potential pain, weakness, potential fall risk, bilateral hip and knee pain.    PT Frequency 2x / week   PT Duration 6 weeks   PT Treatment/Interventions Therapeutic activities;Therapeutic exercise;Manual techniques;Aquatic Therapy;Electrical Stimulation;Iontophoresis 4mg /ml Dexamethasone;Neuromuscular re-education;Balance training;Gait training;Moist Heat;Ultrasound;Patient/family education;Dry needling   PT Next Visit Plan High Volt E-stim, modalities PRN as needed for pain, LE and scapular strengthening, posture   Consulted and Agree with Plan of Care Patient;Family member/caregiver   Family Member Consulted wife      Patient will benefit from skilled therapeutic intervention in order to improve the following deficits and impairments:  Pain, Abnormal gait, Decreased strength, Improper body mechanics, Postural dysfunction, Difficulty walking, Decreased balance  Visit Diagnosis: Bilateral low back pain without sciatica  Cervicalgia  Pain in thoracic spine     Problem List Patient Active Problem List   Diagnosis Date Noted  . Degenerative spondylolisthesis 03/13/2016    Loralyn FreshwaterMiguel Atlanta Pelto PT, DPT   08/07/2016, 7:27 PM  [facility] [address] [place], [place], [address] Phone: [phone]   Fax:  [phone]  Name: Albert PertRoger P Mccomber Jr. MRN: [id number] Date of Birth: 25-May-1946

## 2016-08-09 ENCOUNTER — Ambulatory Visit: Payer: Medicare Other

## 2016-08-09 DIAGNOSIS — M546 Pain in thoracic spine: Secondary | ICD-10-CM

## 2016-08-09 DIAGNOSIS — M542 Cervicalgia: Secondary | ICD-10-CM

## 2016-08-09 DIAGNOSIS — M545 Low back pain, unspecified: Secondary | ICD-10-CM

## 2016-08-09 NOTE — Therapy (Signed)
[facility] [address] [place], [place], [address] Phone: [phone]   Fax:  [phone]  Physical Therapy Treatment  Patient Details  Name: Albert PertRoger P Mings Jr. MRN: [id number] Date of Birth: 13-Oct-1946 Referring Provider: Sherilyn CooterHenry A. Pool, MD  Encounter Date: 08/09/2016      PT End of Session - 08/09/16 1647    Visit Number 11   Number of Visits 13   Date for PT Re-Evaluation 08/16/16   Authorization Type 5   Authorization Time Period of 10   PT Start Time 1647   PT Stop Time 1735   PT Time Calculation (min) 48 min   Activity Tolerance Patient tolerated treatment well   Behavior During Therapy WFL for tasks assessed/performed      Past Medical History:  Diagnosis Date  . Anxiety    takes Xanax daily as needed  . Arthritis   . Back pain   . Chronic back pain   . Diverticulosis   . GERD (gastroesophageal reflux disease)    OTC meds as needed  . History of colon polyps    benign   . History of kidney stones   . History of migraine    takes Imitrex daily as needed.Last one 2 wks ago  . Hyperlipidemia    hx of-has been off of meds for about a yr  . Insomnia    only recent d/t pain  . Joint pain     Past Surgical History:  Procedure Laterality Date  . ANKLE SURGERY Right 1997   ankles/screws   . ARTHROSCOPIC REPAIR ACL Right 1993  . COLONOSCOPY    . ESOPHAGOGASTRODUODENOSCOPY    . LUMBAR FUSION  March 13, 2016   L4/L5 fusion  . TONSILLECTOMY  1962   adenoidectomy   . VASECTOMY  1973    There were no vitals filed for this visit.      Subjective Assessment - 08/09/16 1648    Subjective Doing pretty good. The R upper back bothers him a little bit before bedtime. Does not sleep good at night which does not help any. 0/10 currently. Still can't sit for longer than about 30-40 min before his low back gets a sore feeling. Difficulty sleeping because of difficulty shutting his mind off.    Pertinent History S/P lumbar L4/L5 fusion on 03/13/2016. Last 2-3 weeks, pt started having muscle spasm in his R upper back. Started taking diazepam which is causing muscle weakness for his legs which caused him to use a cane. Had back surgery March 13, 2016 to fuse his L4/L5. Pt was walking well following the surgery until taking diazepam for his R upper back musle spams. Was doing well around June 14, 2016.  Has been taking diazepam for the past 2 weeks which helps with the pain. No known method of injury for his R upper back. Pt also states having a natural curvature at the middle of his back. Pt wife said that the patient might have been compensating which might have caused to R upper back pain. The surgery helped "big time" with his low back pain.  Denies bowel or bladder difficulties or saddle anesthesia.  Pt also states that he stopped taking diazepam since this morning. Now taking Tylenol.     Diagnostic tests Had not had imaging for his upper back and neck   Patient Stated Goals Pt expresses desire to get better.    Currently in Pain? No/denies   Pain Score 0-No  pain   Pain Onset More than a month ago               Objectives  There-ex  Directed patient with seated (on chair with dyna disc) transversus abdominis contraction with knee extension 5x each LE, then 10x2 each LE. Cues for pelvic stability   Seated transversus abdominis contraction with hip flexion 5x sitting on chair with dyna disc  Then 5x sitting on chair with his cushion 10x2 (small movements to promote pelvic control; difficulty with pelvic control [ipsilateral pelvic rotation] during hip flexion)    Standing transversus abdominis contraction  Then with alternating heel raise/knee flexion, emphasis on lumbopelvic control 10x3 each LE with and without UE assist  Then with side stepping 32 ft each direction  Then with forward wedding march 32 ft x 2  Then with standing heel raises 10x3  Wall push-ups (pushing  up against treadmill bars) 5x4. Emphasis on form and trunk control   Standing pallof press resisting yellow band 3x5 with 5 second holds each side, cues to decrease R shoulder shrug.  Reviewed plan of care with pt: target graduation date is next week. Continue skilled PT services following that if needed. Pt and wife verbalized understanding.   Improved exercise technique, movement at target joints, use of target muscles after min to mod verbal, visual, tactile cues.   Some difficulty with lumbopelvic control during exercises but overall improving. Good tolerance with exercises and activities today without complain of pain. Pt making good progress towards goals.          PT Long Term Goals - 07/24/16 1914      PT LONG TERM GOAL #1   Title Patient will have a decrease in R upper back/thoracic/scapular pain to 4/10 or less at worst to promote ability to turn his head.    Baseline 8-9/10 at worst   Time 6   Period Weeks   Status On-going     PT LONG TERM GOAL #2   Title Patient will have a decrease in low back pain to 2/10 or less at worst to promote ability to sit for longer periods and perform sitting tasks   Baseline 3-4/10   Time 6   Period Weeks   Status On-going     PT LONG TERM GOAL #3   Title Patient will improve his Modified Oswestry Low Back Pain Disablity Questionnaire score by at least 6 points as a demonstration of improved function.    Baseline 68%   Time 6   Period Weeks   Status On-going     PT LONG TERM GOAL #4   Title Patient will be able to ambulate at least 500 ft independently and without LOB to promote mobility.    Baseline currently ambulates with hand held assist from wife or [facility] a [facility]; Able to ambulate short distances independently (07/24/2016)   Time 6   Period Weeks   Status On-going               Plan - 08/09/16 1741    Clinical Impression Statement Some difficulty with lumbopelvic control during exercises but overall improving. Good  tolerance with exercises and activities today without complain of pain. Pt making good progress towards goals.    Rehab Potential Good   Clinical Impairments Affecting Rehab Potential pain, weakness, potential fall risk, bilateral hip and knee pain.    PT Frequency 2x / week   PT Duration 6 weeks   PT Treatment/Interventions Therapeutic activities;Therapeutic exercise;Manual techniques;Aquatic  Therapy;Electrical Stimulation;Iontophoresis 4mg /ml Dexamethasone;Neuromuscular re-education;Balance training;Gait training;Moist Heat;Ultrasound;Patient/family education;Dry needling   PT Next Visit Plan High Volt E-stim, modalities PRN as needed for pain, LE and scapular strengthening, posture   Consulted and Agree with Plan of Care Patient;Family member/caregiver   Family Member Consulted wife      Patient will benefit from skilled therapeutic intervention in order to improve the following deficits and impairments:  Pain, Abnormal gait, Decreased strength, Improper body mechanics, Postural dysfunction, Difficulty walking, Decreased balance  Visit Diagnosis: Bilateral low back pain without sciatica  Cervicalgia  Pain in thoracic spine     Problem List Patient Active Problem List   Diagnosis Date Noted  . Degenerative spondylolisthesis 03/13/2016    Loralyn Freshwater PT, DPT   08/09/2016, 7:10 PM  [facility] [address] [place], [address] Phone: [phone]   Fax:  [phone]  Name: Albert [name] MRN: [id number] Date of Birth: Jul 03, 1946

## 2016-08-09 NOTE — Patient Instructions (Signed)
Sitting marches Sitting on a chair in an upright posture and tightening your abdominal muscles:   Raise your right knee, then your left knee (small movements).  Keep your pelvis stable.   Repeat 5 times   Perform 3 sets daily.     Sitting knee extensions Sitting on a chair in an upright posture and tightening your abdominal muscles:    Extend your right knee, then your left knee (small movements)  Keep your pelvis stable.   Repeat 5 times   Perform 3 sets daily.

## 2016-08-14 ENCOUNTER — Ambulatory Visit: Payer: Medicare Other

## 2016-08-22 ENCOUNTER — Ambulatory Visit: Payer: Medicare Other

## 2016-08-28 ENCOUNTER — Ambulatory Visit: Payer: Medicare Other

## 2016-12-26 DIAGNOSIS — G43009 Migraine without aura, not intractable, without status migrainosus: Secondary | ICD-10-CM | POA: Insufficient documentation

## 2016-12-26 DIAGNOSIS — I10 Essential (primary) hypertension: Secondary | ICD-10-CM | POA: Insufficient documentation

## 2016-12-26 DIAGNOSIS — E785 Hyperlipidemia, unspecified: Secondary | ICD-10-CM | POA: Insufficient documentation

## 2016-12-26 DIAGNOSIS — E7800 Pure hypercholesterolemia, unspecified: Secondary | ICD-10-CM | POA: Insufficient documentation

## 2016-12-26 DIAGNOSIS — F419 Anxiety disorder, unspecified: Secondary | ICD-10-CM | POA: Insufficient documentation

## 2016-12-26 DIAGNOSIS — F411 Generalized anxiety disorder: Secondary | ICD-10-CM | POA: Insufficient documentation

## 2016-12-26 DIAGNOSIS — Z0000 Encounter for general adult medical examination without abnormal findings: Secondary | ICD-10-CM | POA: Insufficient documentation

## 2016-12-26 DIAGNOSIS — K219 Gastro-esophageal reflux disease without esophagitis: Secondary | ICD-10-CM | POA: Insufficient documentation

## 2017-03-14 ENCOUNTER — Telehealth: Payer: Self-pay | Admitting: *Deleted

## 2017-03-14 NOTE — Telephone Encounter (Signed)
Received referral for initial lung cancer screening scan. Contacted patient and obtained smoking history,(25 pack year history). Discussed that patient does not currently meet criteria for screening but in the future he should be reevaluated if he continues to smoke. Also discussed smoking cessation programs available and s/s of lung cancer to report to PCP. Verbalized understanding.

## 2017-04-03 IMAGING — RF DG C-ARM 61-120 MIN
1 series · 2 of 2 positions shown · non-contrast
Comparison: MRI February 09, 2016.

FLUOROSCOPY TIME:  33 seconds.

CLINICAL DATA: L4-5 posterior fusion.

EXAM:
LUMBAR SPINE - 2-3 VIEW; DG C-ARM 61-120 MIN

[Series 1: run · 2 of 2 slices shown]
[im 1/2]
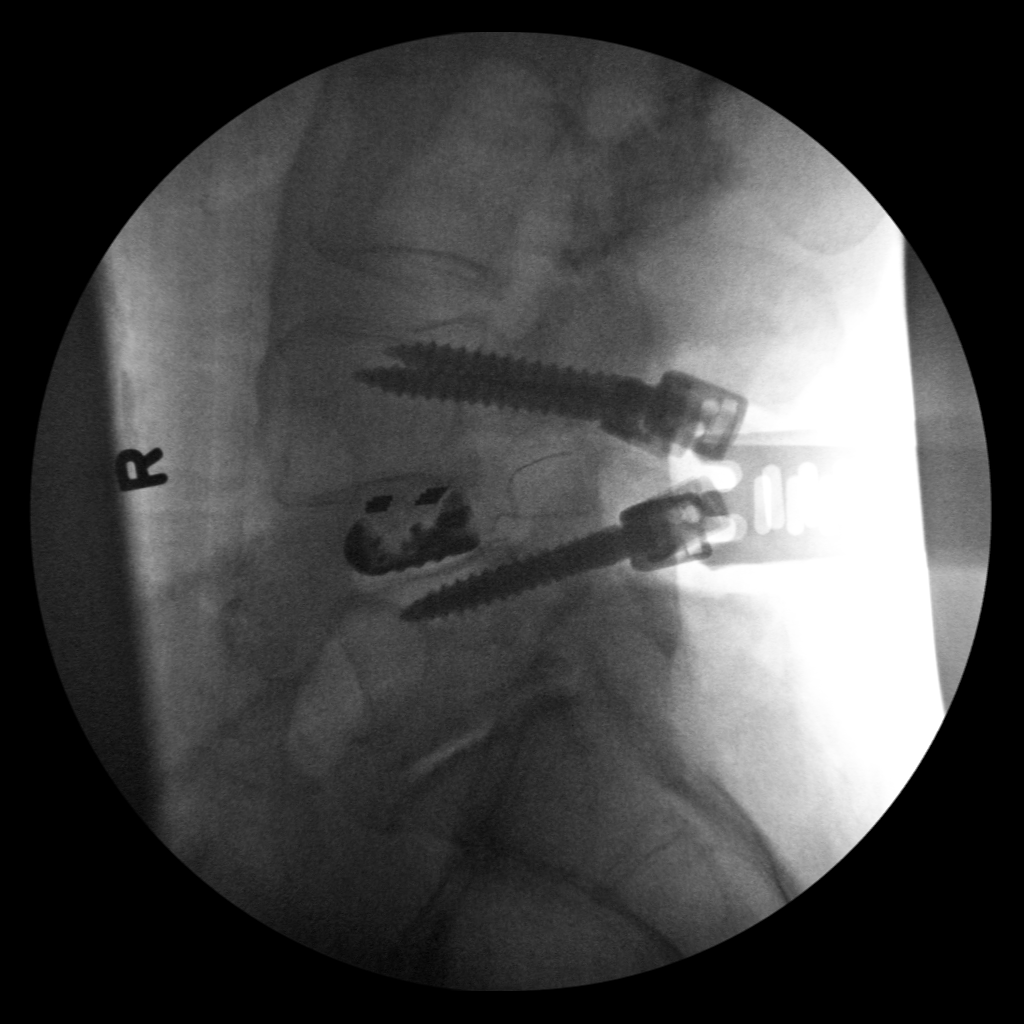
[im 2/2]
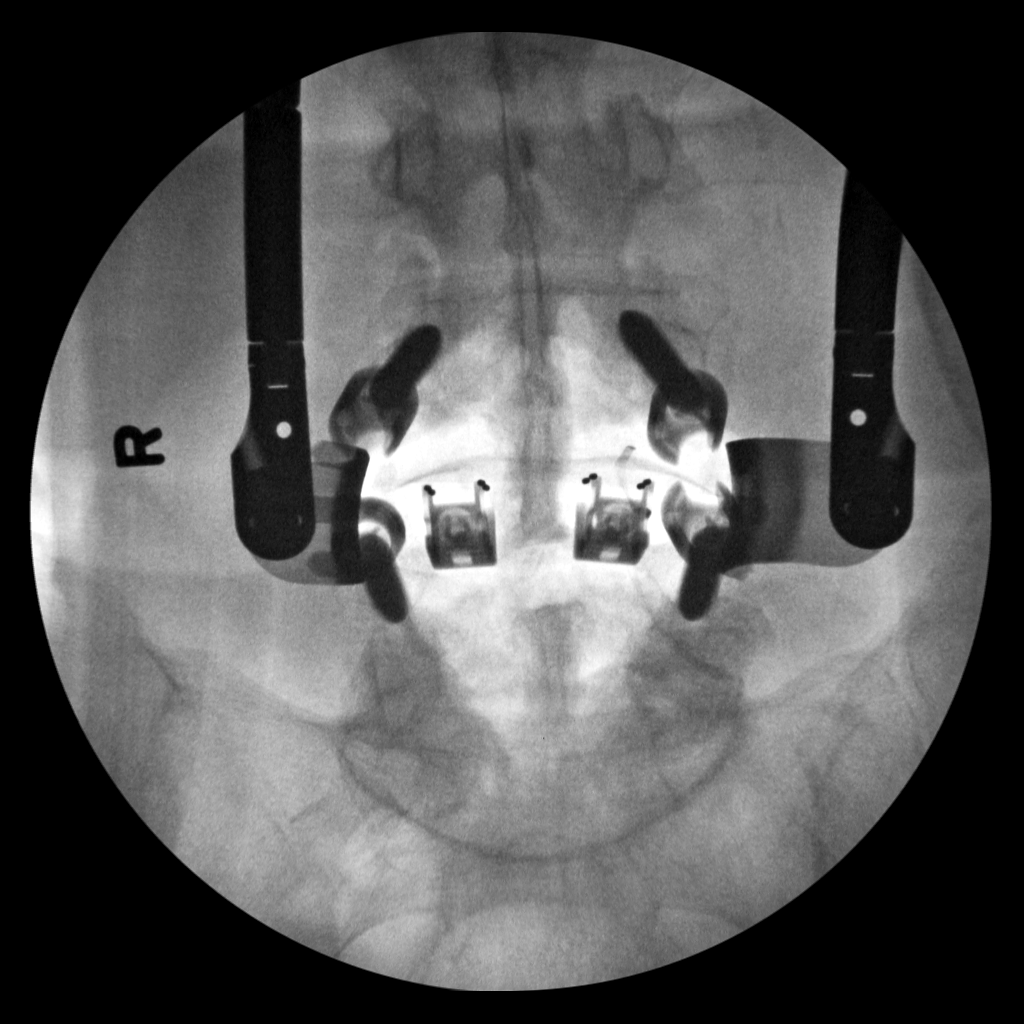

[2 of 2 positions shown; findings below may reference images not displayed]

FINDINGS: Two intraoperative fluoroscopic images of the lower lumbar spine
were obtained. These images demonstrate the patient be status post
surgical posterior fusion of L4-5 with bilateral intrapedicular
screw placement and interbody fusion. Good alignment of vertebral
bodies is noted.
IMPRESSION: Status post surgical posterior fusion of L4-5.

## 2017-04-08 ENCOUNTER — Ambulatory Visit
Admission: RE | Admit: 2017-04-08 | Discharge: 2017-04-08 | Disposition: A | Discharge status: Home / Self Care | Payer: Medicare Other | Source: Ambulatory Visit | Attending: Urology | Admitting: Urology

## 2017-04-08 ENCOUNTER — Other Ambulatory Visit: Payer: Self-pay | Admitting: Radiology

## 2017-04-08 ENCOUNTER — Ambulatory Visit: Payer: Medicare Other | Admitting: Urology

## 2017-04-08 ENCOUNTER — Encounter: Payer: Self-pay | Admitting: Urology

## 2017-04-08 VITALS — BP 158/84 | HR 76 | Ht 75.0 in | Wt 175.0 lb

## 2017-04-08 DIAGNOSIS — N509 Disorder of male genital organs, unspecified: Secondary | ICD-10-CM | POA: Diagnosis not present

## 2017-04-08 DIAGNOSIS — N5089 Other specified disorders of the male genital organs: Secondary | ICD-10-CM

## 2017-04-08 NOTE — Progress Notes (Signed)
 04/08/2017 1:51 PM   Albert P Barno Jr. 03/05/1946 [id number]  Referring provider: Joseph H Rabinowitz, MD [address] [place], [place] [address]  Chief Complaint  Patient presents with  . Testicular Mass    New Patient    HPI: 71-year-old male who presents today for further evaluation of left testicular mass.  Albert [name] reports that he identified a left testicular mass, approximately the size of a marble several months ago. He denies any associated pain. The masses remained stable in size and has not grown or decreased. He was seen by his PCP, no further imaging or labs are obtained. He was referred to urology for further evaluation.  He denies any urinary symptoms including dysuria or gross hematuria. No history of sexual transmitted infections or epididymitis.  He has had fairly significant weight loss over the past year following back surgery. He weight 210 around the time of back surgery and now weighs approximate 170. He reports that he has had a fairly difficult recovery over the past year has had a poor appetite.   He does have occasional night sweats.  No family history of testicular cancer.  He is status post vasectomy.  Patient was sent for a stat scrotal ultrasound today following our visit.  PMH: Past Medical History:  Diagnosis Date  . Anxiety    takes Xanax daily as needed  . Arthritis   . Back pain   . Chronic back pain   . Diverticulosis   . GERD (gastroesophageal reflux disease)    OTC meds as needed  . History of colon polyps    benign   . History of kidney stones   . History of migraine    takes Imitrex daily as needed.Last one 2 wks ago  . Hyperlipidemia    hx of-has been off of meds for about a yr  . Insomnia    only recent d/t pain  . Joint pain     Surgical History: Past Surgical History:  Procedure Laterality Date  . ANKLE SURGERY Right 1997   ankles/screws   . ARTHROSCOPIC REPAIR ACL Right 1993  . COLONOSCOPY    .  ESOPHAGOGASTRODUODENOSCOPY    . LUMBAR FUSION  March 13, 2016   L4/L5 fusion  . TONSILLECTOMY  1962   adenoidectomy   . VASECTOMY  1973    Home Medications:  Allergies as of 04/08/2017      Reactions   Codeine Other (See Comments)   unknown      Medication List       Accurate as of 04/08/17  1:51 PM. Always use your most recent med list.          acetaminophen 325 MG tablet Commonly known as:  TYLENOL Take 650 mg by mouth every 6 (six) hours as needed.   ALPRAZolam 0.25 MG tablet Commonly known as:  XANAX Take 0.25 mg by mouth 3 (three) times daily as needed for anxiety.   carvedilol 6.25 MG tablet Commonly known as:  COREG TAKE 1 TABLET (6.25 MG TOTAL) BY MOUTH 2 (TWO) TIMES DAILY WITH MEALS.   DULoxetine 60 MG capsule Commonly known as:  CYMBALTA TAKE 1 CAPSULE (60 MG TOTAL) BY MOUTH ONCE DAILY.       Allergies:  Allergies  Allergen Reactions  . Codeine Other (See Comments)    unknown    Family History: Family History  Problem Relation Age of Onset  . Prostate cancer Neg Hx   . Bladder Cancer Neg Hx   .   Kidney cancer Neg Hx     Social History:  reports that he has been smoking.  He has smoked for the past 50.00 years. He has never used smokeless tobacco. He reports that he does not drink alcohol or use drugs.  ROS: UROLOGY Frequent Urination?: No Hard to postpone urination?: No Burning/pain with urination?: No Get up at night to urinate?: No Leakage of urine?: No Urine stream starts and stops?: No Trouble starting stream?: No Do you have to strain to urinate?: No Blood in urine?: No Urinary tract infection?: No Sexually transmitted disease?: No Injury to kidneys or bladder?: No Painful intercourse?: No Weak stream?: No Erection problems?: No Penile pain?: No  Gastrointestinal Nausea?: No Vomiting?: No Indigestion/heartburn?: Yes Diarrhea?: Yes Constipation?: No  Constitutional Fever: No Night sweats?: No Weight loss?:  No Fatigue?: No  Skin Skin rash/lesions?: No Itching?: No  Eyes Blurred vision?: No Double vision?: No  Ears/Nose/Throat Sore throat?: No Sinus problems?: Yes  Hematologic/Lymphatic Swollen glands?: No Easy bruising?: No  Cardiovascular Leg swelling?: No Chest pain?: No  Respiratory Cough?: No Shortness of breath?: No  Endocrine Excessive thirst?: No  Musculoskeletal Back pain?: Yes Joint pain?: Yes  Neurological Headaches?: No Dizziness?: No  Psychologic Depression?: Yes Anxiety?: Yes  Physical Exam: BP (!) 158/84   Pulse 76   Ht 6' 3" (1.905 m)   Wt 175 lb (79.4 kg)   BMI 21.87 kg/m   Constitutional:  Alert and oriented, No acute distress.  Wife present today. HEENT: [place] AT, moist mucus membranes.  Trachea midline, no masses. Cardiovascular: No clubbing, cyanosis, or edema. Respiratory: Normal respiratory effort, no increased work of breathing. GI: Abdomen is soft, nontender, nondistended, no abdominal masses GU: Normal phallus with orthotopic meatus. Bilateral descended testicles, no scrotal skin changes.  Normal right testicle. Approximately 2 cm left firm testicular versus epididymal mass, there is a discrete ridge between the 2 structures but unclear whether this is a fungating mass from the lower pole of the testicle or if it arising from the epididymis on exam. Skin: No rashes, bruises or suspicious lesions. Lymph: No cervical or inguinal adenopathy. Neurologic: Grossly intact, no focal deficits, moving all 4 extremities. Psychiatric: Normal mood and affect.  Laboratory Data: Lab Results  Component Value Date   WBC 7.3 03/07/2016   HGB 13.5 03/07/2016   HCT 40.9 03/07/2016   MCV 93.6 03/07/2016   PLT 219 03/07/2016    Lab Results  Component Value Date   CREATININE 0.83 03/07/2016    Urinalysis    Component Value Date/Time   COLORURINE Amber 09/05/2014 1506   APPEARANCEUR Hazy 09/05/2014 1506   LABSPEC 1.030 09/05/2014 1506    PHURINE 6.0 09/05/2014 1506   GLUCOSEU Negative 09/05/2014 1506   HGBUR 3+ 09/05/2014 1506   BILIRUBINUR Negative 09/05/2014 1506   KETONESUR Negative 09/05/2014 1506   PROTEINUR 75 mg/dL 09/05/2014 1506   NITRITE Negative 09/05/2014 1506   LEUKOCYTESUR Negative 09/05/2014 1506    Pertinent Imaging: CLINICAL DATA:  Left scrotal mass for several months  EXAM: SCROTAL ULTRASOUND  DOPPLER ULTRASOUND OF THE TESTICLES  TECHNIQUE: Complete ultrasound examination of the testicles, epididymis, and other scrotal structures was performed. Color and spectral Doppler ultrasound were also utilized to evaluate blood flow to the testicles.  COMPARISON:  None.  FINDINGS: Right testicle  Measurements: 4.8 x 2.6 x 3.6 cm. No mass or microlithiasis visualized.  Left testicle  Measurements: 4.3 x 1.9 x 2.9 cm. No mass or microlithiasis visualized.  Right epididymis: Cystic   area in the right epididymis is noted measuring 7 mm.  Left epididymis: A 1.5 cm heterogeneous mass is noted within the tail of the left epididymis.  Hydrocele:  Small left hydrocele  Varicocele:  None visualized.  Pulsed Doppler interrogation of both testes demonstrates normal low resistance arterial and venous waveforms bilaterally.  IMPRESSION: Heterogeneous mass in the region of the tail of the left epididymis. It demonstrates some slight increased echogenicity and may simply represent a mildly complicated lipoma of the epididymis. The possibility of an adenomatoid tumor or extratesticular neoplasm deserves consideration as well.   Electronically Signed   By: Mark  Lukens M.D.   On: 04/08/2017 15:58  Scrotal ultrasound was personally reviewed today.  Assessment & Plan:    1. Scrotal mass Approximate 2 cm left scrotal mass which appears to be arising from the tail of the left epididymis We discussed the findings by telephone today following the ultrasound.  Lesions arising from  outside of the testicle itself are most likely benign but I would recommend surgical excision for primarily diagnostic reasons to rule out underlying pathology. We discussed proceeding with left partial versus total epididymectomy and will likely use in inguinal approach as a precaution. Risk of surgery discussed with the patient in detail including risk of bleeding, infection, hematoma, chronic testicular pain, testicular loss amongst others. All questions were answered. Postop course was reviewed.  arising- US Art/Ven Flow Abd Pelv Doppler - US Scrotum  Schedule surgery as above  Azalynn Maxim, MD  [facility] [address], [address] [place], [place] [address] [phone]  

## 2017-04-09 ENCOUNTER — Other Ambulatory Visit: Payer: Self-pay | Admitting: Radiology

## 2017-04-09 ENCOUNTER — Telehealth: Payer: Self-pay | Admitting: Radiology

## 2017-04-09 ENCOUNTER — Inpatient Hospital Stay: Admission: RE | Admit: 2017-04-09 | Payer: Medicare Other | Source: Ambulatory Visit

## 2017-04-09 ENCOUNTER — Ambulatory Visit: Payer: Self-pay | Admitting: Urology

## 2017-04-09 DIAGNOSIS — N5089 Other specified disorders of the male genital organs: Secondary | ICD-10-CM

## 2017-04-09 MED ORDER — CEFAZOLIN SODIUM-DEXTROSE 2-4 GM/100ML-% IV SOLN
2.0000 g | INTRAVENOUS | Status: AC
  Administered 2017-04-10: 2 g via INTRAVENOUS

## 2017-04-09 NOTE — Telephone Encounter (Signed)
Notified pt of surgery scheduled with Dr Apolinar Junes on 04/10/17 & to arrive at pre-admit testing at 10:00 prior to surgery. Advised pt to be npo after mn except to take carvedilol with a sip of water. Pt voices understanding.

## 2017-04-10 ENCOUNTER — Ambulatory Visit: Payer: Medicare Other | Admitting: Anesthesiology

## 2017-04-10 ENCOUNTER — Encounter: Payer: Self-pay | Admitting: *Deleted

## 2017-04-10 ENCOUNTER — Ambulatory Visit
Admission: RE | Admit: 2017-04-10 | Discharge: 2017-04-10 | Disposition: A | Discharge status: Home / Self Care | Payer: Medicare Other | Source: Ambulatory Visit | Attending: Urology | Admitting: Urology

## 2017-04-10 ENCOUNTER — Encounter
Admission: RE | Disposition: A | Discharge status: Home / Self Care | Payer: Self-pay | Source: Ambulatory Visit | Attending: Urology

## 2017-04-10 DIAGNOSIS — M549 Dorsalgia, unspecified: Secondary | ICD-10-CM | POA: Insufficient documentation

## 2017-04-10 DIAGNOSIS — N5089 Other specified disorders of the male genital organs: Secondary | ICD-10-CM | POA: Diagnosis not present

## 2017-04-10 DIAGNOSIS — M1990 Unspecified osteoarthritis, unspecified site: Secondary | ICD-10-CM | POA: Diagnosis not present

## 2017-04-10 DIAGNOSIS — K5790 Diverticulosis of intestine, part unspecified, without perforation or abscess without bleeding: Secondary | ICD-10-CM | POA: Insufficient documentation

## 2017-04-10 DIAGNOSIS — F419 Anxiety disorder, unspecified: Secondary | ICD-10-CM | POA: Diagnosis not present

## 2017-04-10 DIAGNOSIS — M2550 Pain in unspecified joint: Secondary | ICD-10-CM | POA: Diagnosis not present

## 2017-04-10 DIAGNOSIS — Z87442 Personal history of urinary calculi: Secondary | ICD-10-CM | POA: Insufficient documentation

## 2017-04-10 DIAGNOSIS — E785 Hyperlipidemia, unspecified: Secondary | ICD-10-CM | POA: Diagnosis not present

## 2017-04-10 DIAGNOSIS — Z981 Arthrodesis status: Secondary | ICD-10-CM | POA: Diagnosis not present

## 2017-04-10 DIAGNOSIS — G43909 Migraine, unspecified, not intractable, without status migrainosus: Secondary | ICD-10-CM | POA: Diagnosis not present

## 2017-04-10 DIAGNOSIS — K219 Gastro-esophageal reflux disease without esophagitis: Secondary | ICD-10-CM | POA: Insufficient documentation

## 2017-04-10 DIAGNOSIS — Z86010 Personal history of colonic polyps: Secondary | ICD-10-CM | POA: Diagnosis not present

## 2017-04-10 DIAGNOSIS — Z885 Allergy status to narcotic agent status: Secondary | ICD-10-CM | POA: Diagnosis not present

## 2017-04-10 DIAGNOSIS — G4700 Insomnia, unspecified: Secondary | ICD-10-CM | POA: Insufficient documentation

## 2017-04-10 DIAGNOSIS — G8929 Other chronic pain: Secondary | ICD-10-CM | POA: Insufficient documentation

## 2017-04-10 DIAGNOSIS — R222 Localized swelling, mass and lump, trunk: Secondary | ICD-10-CM | POA: Diagnosis present

## 2017-04-10 DIAGNOSIS — F17200 Nicotine dependence, unspecified, uncomplicated: Secondary | ICD-10-CM | POA: Insufficient documentation

## 2017-04-10 DIAGNOSIS — Z9852 Vasectomy status: Secondary | ICD-10-CM | POA: Insufficient documentation

## 2017-04-10 DIAGNOSIS — I4510 Unspecified right bundle-branch block: Secondary | ICD-10-CM | POA: Diagnosis not present

## 2017-04-10 DIAGNOSIS — N503 Cyst of epididymis: Secondary | ICD-10-CM | POA: Diagnosis not present

## 2017-04-10 HISTORY — PX: EPIDIDYMECTOMY: SHX6275

## 2017-04-10 SURGERY — EPIDIDYMECTOMY
Anesthesia: General | Site: Groin | Laterality: Left | Wound class: Clean

## 2017-04-10 MED ORDER — FENTANYL CITRATE (PF) 100 MCG/2ML IJ SOLN
INTRAMUSCULAR | Status: AC
  Filled 2017-04-10: qty 2

## 2017-04-10 MED ORDER — CEFAZOLIN SODIUM-DEXTROSE 2-4 GM/100ML-% IV SOLN
INTRAVENOUS | Status: AC
  Administered 2017-04-10: 2 g via INTRAVENOUS
  Filled 2017-04-10: qty 100

## 2017-04-10 MED ORDER — ONDANSETRON HCL 4 MG/2ML IJ SOLN
INTRAMUSCULAR | Status: DC | PRN
Start: 2017-04-10 — End: 2017-04-10
  Administered 2017-04-10: 4 mg via INTRAVENOUS

## 2017-04-10 MED ORDER — BUPIVACAINE HCL 0.5 % IJ SOLN
INTRAMUSCULAR | Status: DC | PRN
  Administered 2017-04-10: 10 mL

## 2017-04-10 MED ORDER — ONDANSETRON HCL 4 MG/2ML IJ SOLN
INTRAMUSCULAR | Status: AC
  Filled 2017-04-10: qty 2

## 2017-04-10 MED ORDER — DEXAMETHASONE SODIUM PHOSPHATE 10 MG/ML IJ SOLN
INTRAMUSCULAR | Status: AC
  Filled 2017-04-10: qty 1

## 2017-04-10 MED ORDER — BUPIVACAINE HCL (PF) 0.5 % IJ SOLN
INTRAMUSCULAR | Status: AC
  Filled 2017-04-10: qty 30

## 2017-04-10 MED ORDER — LIDOCAINE HCL (PF) 2 % IJ SOLN
INTRAMUSCULAR | Status: AC
  Filled 2017-04-10: qty 2

## 2017-04-10 MED ORDER — MIDAZOLAM HCL 2 MG/2ML IJ SOLN
INTRAMUSCULAR | Status: AC
  Filled 2017-04-10: qty 2

## 2017-04-10 MED ORDER — DEXAMETHASONE SODIUM PHOSPHATE 10 MG/ML IJ SOLN
INTRAMUSCULAR | Status: DC | PRN
  Administered 2017-04-10: 5 mg via INTRAVENOUS

## 2017-04-10 MED ORDER — PROPOFOL 10 MG/ML IV BOLUS
INTRAVENOUS | Status: AC
  Filled 2017-04-10: qty 20

## 2017-04-10 MED ORDER — ONDANSETRON HCL 4 MG/2ML IJ SOLN: 4.0000 mg | Freq: Once | INTRAMUSCULAR | Status: DC | PRN

## 2017-04-10 MED ORDER — HYDROCODONE-ACETAMINOPHEN 5-325 MG PO TABS
ORAL_TABLET | ORAL | Status: AC
  Administered 2017-04-10: 1 via ORAL
  Filled 2017-04-10: qty 1

## 2017-04-10 MED ORDER — HYDROCODONE-ACETAMINOPHEN 5-325 MG PO TABS
1.0000 | ORAL_TABLET | Freq: Four times a day (QID) | ORAL | Status: DC | PRN
  Administered 2017-04-10: 1 via ORAL

## 2017-04-10 MED ORDER — LIDOCAINE HCL (CARDIAC) 20 MG/ML IV SOLN
INTRAVENOUS | Status: DC | PRN
  Administered 2017-04-10: 100 mg via INTRAVENOUS

## 2017-04-10 MED ORDER — PROPOFOL 10 MG/ML IV BOLUS
INTRAVENOUS | Status: DC | PRN
  Administered 2017-04-10: 150 mg via INTRAVENOUS

## 2017-04-10 MED ORDER — FAMOTIDINE 20 MG PO TABS
ORAL_TABLET | ORAL | Status: AC
  Administered 2017-04-10: 20 mg via ORAL
  Filled 2017-04-10: qty 1

## 2017-04-10 MED ORDER — FAMOTIDINE 20 MG PO TABS
20.0000 mg | ORAL_TABLET | Freq: Once | ORAL | Status: AC
  Administered 2017-04-10: 20 mg via ORAL

## 2017-04-10 MED ORDER — FENTANYL CITRATE (PF) 100 MCG/2ML IJ SOLN
INTRAMUSCULAR | Status: AC
  Administered 2017-04-10: 25 ug via INTRAVENOUS
  Filled 2017-04-10: qty 2

## 2017-04-10 MED ORDER — HYDROCODONE-ACETAMINOPHEN 5-325 MG PO TABS: 1.0000 | ORAL_TABLET | Freq: Four times a day (QID) | ORAL | 0 refills | Status: DC | PRN

## 2017-04-10 MED ORDER — MIDAZOLAM HCL 2 MG/2ML IJ SOLN
INTRAMUSCULAR | Status: DC | PRN
  Administered 2017-04-10: 2 mg via INTRAVENOUS

## 2017-04-10 MED ORDER — EPHEDRINE SULFATE 50 MG/ML IJ SOLN
INTRAMUSCULAR | Status: DC | PRN
  Administered 2017-04-10 (×2): 10 mg via INTRAVENOUS

## 2017-04-10 MED ORDER — FENTANYL CITRATE (PF) 100 MCG/2ML IJ SOLN
INTRAMUSCULAR | Status: DC | PRN
  Administered 2017-04-10: 50 ug via INTRAVENOUS
  Administered 2017-04-10 (×2): 25 ug via INTRAVENOUS

## 2017-04-10 MED ORDER — FENTANYL CITRATE (PF) 100 MCG/2ML IJ SOLN
25.0000 ug | INTRAMUSCULAR | Status: DC | PRN
  Administered 2017-04-10 (×2): 25 ug via INTRAVENOUS

## 2017-04-10 MED ORDER — LACTATED RINGERS IV SOLN
INTRAVENOUS | Status: DC
  Administered 2017-04-10: 75 mL/h via INTRAVENOUS

## 2017-04-10 MED ORDER — DOCUSATE SODIUM 100 MG PO CAPS: 100.0000 mg | ORAL_CAPSULE | Freq: Two times a day (BID) | ORAL | 0 refills | Status: DC

## 2017-04-10 SURGICAL SUPPLY — 27 items
BLADE SURG 15 STRL LF DISP TIS (BLADE) ×1 IMPLANT
BLADE SURG 15 STRL SS (BLADE) ×2
CANISTER SUCT 1200ML W/VALVE (MISCELLANEOUS) ×3 IMPLANT
DERMABOND ADVANCED (GAUZE/BANDAGES/DRESSINGS) ×2
DERMABOND ADVANCED .7 DNX12 (GAUZE/BANDAGES/DRESSINGS) ×1 IMPLANT
DRAIN PENROSE 1/4X12 LTX (DRAIN) ×3 IMPLANT
GAUZE FLUFF 18X24 1PLY STRL (GAUZE/BANDAGES/DRESSINGS) ×3 IMPLANT
GEL ULTRASOUND 20GR AQUASONIC (MISCELLANEOUS) ×3 IMPLANT
GLOVE BIO SURGEON STRL SZ 6.5 (GLOVE) ×2 IMPLANT
GLOVE BIO SURGEONS STRL SZ 6.5 (GLOVE) ×1
GLOVE INDICATOR 7.0 STRL GRN (GLOVE) IMPLANT
GOWN STRL REUS W/ TWL LRG LVL3 (GOWN DISPOSABLE) ×2 IMPLANT
GOWN STRL REUS W/TWL LRG LVL3 (GOWN DISPOSABLE) ×4
KIT RM TURNOVER STRD PROC AR (KITS) ×3 IMPLANT
NS IRRIG 500ML POUR BTL (IV SOLUTION) ×3 IMPLANT
PACK BASIN MINOR ARMC (MISCELLANEOUS) ×3 IMPLANT
PACK DNC HYST (MISCELLANEOUS) IMPLANT
SPONGE KITTNER 5P (MISCELLANEOUS) IMPLANT
SUPPORT SCROTAL LG STRP (MISCELLANEOUS) ×2 IMPLANT
SUPPORT SCROTAL MED ADLT STRP (MISCELLANEOUS) IMPLANT
SUPPORTER ATHLETIC LG (MISCELLANEOUS) ×1
SUPPORTER ATHLETIC MED (MISCELLANEOUS)
SUT CHROMIC 3 0 SH 27 (SUTURE) IMPLANT
SUT ETHILON NAB PS2 4-0 18IN (SUTURE) ×6 IMPLANT
SUT VIC AB 3-0 SH 27 (SUTURE) ×4
SUT VIC AB 3-0 SH 27X BRD (SUTURE) ×2 IMPLANT
SUT VIC AB 4-0 PS2 18 (SUTURE) ×6 IMPLANT

## 2017-04-10 NOTE — Op Note (Signed)
Date of procedure: 04/10/17  Preoperative diagnosis:  1. Left epididymal mass   Postoperative diagnosis:  1. Same as above   Procedure: 1. Left partial epididymectomy  Surgeon: [name], MD  Anesthesia: General  Complications: None  Intraoperative findings: Firm partially 2 cm nodule within the tail of the left epididymis.  EBL: Minimal  Specimens: Left vas deferens, tail and body of the epididymis.  Drains: None  Indication: Albert [name] is a 71 y.o. patient with firmer personally 2 cm complex left epididymal mass.  After reviewing the management options for treatment, he elected to proceed with the above surgical procedure(s). We have discussed the potential benefits and risks of the procedure, side effects of the proposed treatment, the likelihood of the patient achieving the goals of the procedure, and any potential problems that might occur during the procedure or recuperation. Informed consent has been obtained.  Description of procedure:  The patient was taken to the operating room and general anesthesia was induced.  The patient was placed in the supine position, prepped and draped in the usual sterile fashion, and preoperative antibiotics were administered. A preoperative time-out was performed.   A small personally 70 cm incision was created in the left sub-inguinal area. Prior to this, 10 cc of half percent Marcaine was instilled into the wound for local anesthetic. The incision was carried down through the subcutaneous tissues using Bovie electrocautery. The left testicular cord structures were then identified and isolated. A Penrose string was wrapped around the cord structures. The testicle was then mobilized from the left hemiscrotum and delivered through the subinguinal incision. The gubernaculum was divided using Bovie electrocautery as well as a series of 3-0 Vicryl ties. The tunica vaginalis was then opened and the left testicle was exposed which was  grossly normal. The left epididymis however, with abnormal with a nodular mass in the tail of the epididymis. At this point in time, careful meticulous dissection of the left tail of the epididymis was performed separating it from the overlying tunica vaginalis and from the testicle itself. A series of tubules were identified and tied off using 3-0 Vicryl's. At the level of the mid epididymis, the epididymis was transected after being tied off on either side. The cut end of the convoluted vas was also isolated. Care was taken to avoid any injury to the testicular cord and vasculature. Ultimate, I was able to free off the mid epididymis as well as the tail and convoluted portion of the vas passing OFF as a single specimen. The blood flow to the testicle did not appear to be compromised. As a precaution, I did place a Doppler on the testicle itself confirming excellent blood flow within the testicle. Careful meticulous hemostasis was achieved both in the scrotum as well as along the cord structures. The testicle was then carefully delivered back into the left hemiscrotum in its correct anatomic orientation. The subcutaneous layers were closed with a series of interrupted 3-0 Vicryl suture. A 4-0 Monocryl was used in a subcuticular fashion to close the skin as well as Dermabond. Scrotal fluffs and a scrotal support device are applied. The patient was then repositioned the supine position, reversed anesthesia, and taken to the PACU in stable condition. There were no complications in this case.  Plan: I'll call the patient with his pathology results. He'll follow-up in 4 weeks for wound check.  [name], M.D.

## 2017-04-10 NOTE — Transfer of Care (Signed)
Immediate Anesthesia Transfer of Care Note  Patient: Albert [name]  Procedure(s) Performed: Procedure(s): EPIDIDYMECTOMY (Left)  Patient Location: PACU  Anesthesia Type:General  Level of Consciousness: sedated  Airway & Oxygen Therapy: Patient connected to face mask oxygen  Post-op Assessment: Post -op Vital signs reviewed and stable  Post vital signs: stable  Last Vitals:  Vitals:   04/10/17 1058 04/10/17 1413  BP: (!) 160/98 (!) 169/88  Pulse: 61 62  Resp: 16 11  Temp: (!) 35.6 C     Last Pain:  Vitals:   04/10/17 1058  TempSrc: Tympanic         Complications: No apparent anesthesia complications

## 2017-04-10 NOTE — Anesthesia Procedure Notes (Signed)
Procedure Name: LMA Insertion Date/Time: 04/10/2017 12:38 PM Performed by: Irving Burton Pre-anesthesia Checklist: Patient identified, Emergency Drugs available, Suction available and Patient being monitored Patient Re-evaluated:Patient Re-evaluated prior to inductionOxygen Delivery Method: Circle system utilized Preoxygenation: Pre-oxygenation with 100% oxygen Intubation Type: IV induction Ventilation: Mask ventilation without difficulty LMA: LMA inserted LMA Size: 4.5 Number of attempts: 1 Placement Confirmation: positive ETCO2 and breath sounds checked- equal and bilateral Tube secured with: Tape Dental Injury: Teeth and Oropharynx as per pre-operative assessment

## 2017-04-10 NOTE — Brief Op Note (Signed)
04/10/2017  2:04 PM  PATIENT:  Albert [name]  71 y.o. male  PRE-OPERATIVE DIAGNOSIS:  SCROTAL MASS LEFT  POST-OPERATIVE DIAGNOSIS:  SCROTAL MASS LEFT  PROCEDURE:  Procedure(s): EPIDIDYMECTOMY (Left)  SURGEON:  Surgeon(s) and Role:    * [name], MD - Primary  ASSISTANTS: none   ANESTHESIA:   general  EBL:  No intake/output data recorded.  Drains: none  Specimen: left vas deferens, tail and body of epididymis  COUNTS CORRECT: YES  PLAN OF CARE: Discharge to home after PACU  PATIENT DISPOSITION:  PACU - hemodynamically stable.

## 2017-04-10 NOTE — Interval H&P Note (Signed)
History and Physical Interval Note:  04/10/2017 12:09 PM  Albert [name]  has presented today for surgery, with the diagnosis of SCROTAL MASS  The various methods of treatment have been discussed with the patient and family. After consideration of risks, benefits and other options for treatment, the patient has consented to  Procedure(s): EPIDIDYMECTOMY (Left) as a surgical intervention .  The patient's history has been reviewed, patient examined, no change in status, stable for surgery.  I have reviewed the patient's chart and labs.  Questions were answered to the patient's satisfaction.    RRR CTAB  [name]

## 2017-04-10 NOTE — Anesthesia Postprocedure Evaluation (Signed)
Anesthesia Post Note  Patient: Albert [name]  Procedure(s) Performed: Procedure(s) (LRB): EPIDIDYMECTOMY (Left)  Patient location during evaluation: PACU Anesthesia Type: General Level of consciousness: awake Pain management: pain level controlled Vital Signs Assessment: post-procedure vital signs reviewed and stable Respiratory status: spontaneous breathing Cardiovascular status: stable Anesthetic complications: no     Last Vitals:  Vitals:   04/10/17 1429 04/10/17 1439  BP: (!) 167/84   Pulse: (!) 57 (!) 56  Resp: (!) 9 (!) 9  Temp:      Last Pain:  Vitals:   04/10/17 1439  TempSrc:   PainSc: 5                  VAN STAVEREN,Ashraf Mesta

## 2017-04-10 NOTE — Anesthesia Post-op Follow-up Note (Cosign Needed)
Anesthesia QCDR form completed.        

## 2017-04-10 NOTE — Anesthesia Preprocedure Evaluation (Signed)
Anesthesia Evaluation  Patient identified by MRN, date of birth, ID band Patient awake    Reviewed: Allergy & Precautions, NPO status , Patient's Chart, lab work & pertinent test results, reviewed documented beta blocker date and time   History of Anesthesia Complications Negative for: history of anesthetic complications  Airway Mallampati: II       Dental   Pulmonary neg pulmonary ROS, Current Smoker,           Cardiovascular negative cardio ROS       Neuro/Psych  Headaches, Anxiety    GI/Hepatic Neg liver ROS, GERD  Medicated and Controlled,  Endo/Other  negative endocrine ROS  Renal/GU negative Renal ROS     Musculoskeletal   Abdominal   Peds  Hematology negative hematology ROS (+)   Anesthesia Other Findings   Reproductive/Obstetrics                             Anesthesia Physical Anesthesia Plan  ASA: II  Anesthesia Plan: General   Post-op Pain Management:    Induction: Intravenous  Airway Management Planned: LMA  Additional Equipment:   Intra-op Plan:   Post-operative Plan:   Informed Consent: I have reviewed the patients History and Physical, chart, labs and discussed the procedure including the risks, benefits and alternatives for the proposed anesthesia with the patient or authorized representative who has indicated his/her understanding and acceptance.     Plan Discussed with:   Anesthesia Plan Comments:         Anesthesia Quick Evaluation

## 2017-04-10 NOTE — H&P (View-Only) (Signed)
04/08/2017 1:51 PM   Albert [name] [birth year] [id number]  Referring provider: Gilles Chiquito, MD [address] [place], [place] [address]  Chief Complaint  Patient presents with  . Testicular Mass    New Patient    HPI: 71 year old male who presents today for further evaluation of left testicular mass.  Albert [name] reports that he identified a left testicular mass, approximately the size of a marble several months ago. He denies any associated pain. The masses remained stable in size and has not grown or decreased. He was seen by his PCP, no further imaging or labs are obtained. He was referred to urology for further evaluation.  He denies any urinary symptoms including dysuria or gross hematuria. No history of sexual transmitted infections or epididymitis.  He has had fairly significant weight loss over the past year following back surgery. He weight 210 around the time of back surgery and now weighs approximate 170. He reports that he has had a fairly difficult recovery over the past year has had a poor appetite.   He does have occasional night sweats.  No family history of testicular cancer.  He is status post vasectomy.  Patient was sent for a stat scrotal ultrasound today following our visit.  PMH: Past Medical History:  Diagnosis Date  . Anxiety    takes Xanax daily as needed  . Arthritis   . Back pain   . Chronic back pain   . Diverticulosis   . GERD (gastroesophageal reflux disease)    OTC meds as needed  . History of colon polyps    benign   . History of kidney stones   . History of migraine    takes Imitrex daily as needed.Last one 2 wks ago  . Hyperlipidemia    hx of-has been off of meds for about a yr  . Insomnia    only recent d/t pain  . Joint pain     Surgical History: Past Surgical History:  Procedure Laterality Date  . ANKLE SURGERY Right 1997   ankles/screws   . ARTHROSCOPIC REPAIR ACL Right 1993  . COLONOSCOPY    .  ESOPHAGOGASTRODUODENOSCOPY    . LUMBAR FUSION  March 13, 2016   L4/L5 fusion  . TONSILLECTOMY  1962   adenoidectomy   . VASECTOMY  1973    Home Medications:  Allergies as of 04/08/2017      Reactions   Codeine Other (See Comments)   unknown      Medication List       Accurate as of 04/08/17  1:51 PM. Always use your most recent med list.          acetaminophen 325 MG tablet Commonly known as:  TYLENOL Take 650 mg by mouth every 6 (six) hours as needed.   ALPRAZolam 0.25 MG tablet Commonly known as:  XANAX Take 0.25 mg by mouth 3 (three) times daily as needed for anxiety.   carvedilol 6.25 MG tablet Commonly known as:  COREG TAKE 1 TABLET (6.25 MG TOTAL) BY MOUTH 2 (TWO) TIMES DAILY WITH MEALS.   DULoxetine 60 MG capsule Commonly known as:  CYMBALTA TAKE 1 CAPSULE (60 MG TOTAL) BY MOUTH ONCE DAILY.       Allergies:  Allergies  Allergen Reactions  . Codeine Other (See Comments)    unknown    Family History: Family History  Problem Relation Age of Onset  . Prostate cancer Neg Hx   . Bladder Cancer Neg Hx   .  Kidney cancer Neg Hx     Social History:  reports that he has been smoking.  He has smoked for the past 50.00 years. He has never used smokeless tobacco. He reports that he does not drink alcohol or use drugs.  ROS: UROLOGY Frequent Urination?: No Hard to postpone urination?: No Burning/pain with urination?: No Get up at night to urinate?: No Leakage of urine?: No Urine stream starts and stops?: No Trouble starting stream?: No Do you have to strain to urinate?: No Blood in urine?: No Urinary tract infection?: No Sexually transmitted disease?: No Injury to kidneys or bladder?: No Painful intercourse?: No Weak stream?: No Erection problems?: No Penile pain?: No  Gastrointestinal Nausea?: No Vomiting?: No Indigestion/heartburn?: Yes Diarrhea?: Yes Constipation?: No  Constitutional Fever: No Night sweats?: No Weight loss?:  No Fatigue?: No  Skin Skin rash/lesions?: No Itching?: No  Eyes Blurred vision?: No Double vision?: No  Ears/Nose/Throat Sore throat?: No Sinus problems?: Yes  Hematologic/Lymphatic Swollen glands?: No Easy bruising?: No  Cardiovascular Leg swelling?: No Chest pain?: No  Respiratory Cough?: No Shortness of breath?: No  Endocrine Excessive thirst?: No  Musculoskeletal Back pain?: Yes Joint pain?: Yes  Neurological Headaches?: No Dizziness?: No  Psychologic Depression?: Yes Anxiety?: Yes  Physical Exam: BP (!) 158/84   Pulse 76   Ht  (1.905 m)   Wt 175 lb (79.4 kg)   BMI 21.87 kg/m   Constitutional:  Alert and oriented, No acute distress.  Wife present today. HEENT: [place] AT, moist mucus membranes.  Trachea midline, no masses. Cardiovascular: No clubbing, cyanosis, or edema. Respiratory: Normal respiratory effort, no increased work of breathing. GI: Abdomen is soft, nontender, nondistended, no abdominal masses GU: Normal phallus with orthotopic meatus. Bilateral descended testicles, no scrotal skin changes.  Normal right testicle. Approximately 2 cm left firm testicular versus epididymal mass, there is a discrete ridge between the 2 structures but unclear whether this is a fungating mass from the lower pole of the testicle or if it arising from the epididymis on exam. Skin: No rashes, bruises or suspicious lesions. Lymph: No cervical or inguinal adenopathy. Neurologic: Grossly intact, no focal deficits, moving all 4 extremities. Psychiatric: Normal mood and affect.  Laboratory Data: Lab Results  Component Value Date   WBC 7.3 03/07/2016   HGB 13.5 03/07/2016   HCT 40.9 03/07/2016   MCV 93.6 03/07/2016   PLT 219 03/07/2016    Lab Results  Component Value Date   CREATININE 0.83 03/07/2016    Urinalysis    Component Value Date/Time   COLORURINE Amber 09/05/2014 1506   APPEARANCEUR Hazy 09/05/2014 1506   LABSPEC 1.030 09/05/2014 1506    PHURINE 6.0 09/05/2014 1506   GLUCOSEU Negative 09/05/2014 1506   HGBUR 3+ 09/05/2014 1506   BILIRUBINUR Negative 09/05/2014 1506   KETONESUR Negative 09/05/2014 1506   PROTEINUR 75 mg/dL 69/62/9528 4132   NITRITE Negative 09/05/2014 1506   LEUKOCYTESUR Negative 09/05/2014 1506    Pertinent Imaging: CLINICAL DATA:  Left scrotal mass for several months  EXAM: SCROTAL ULTRASOUND  DOPPLER ULTRASOUND OF THE TESTICLES  TECHNIQUE: Complete ultrasound examination of the testicles, epididymis, and other scrotal structures was performed. Color and spectral Doppler ultrasound were also utilized to evaluate blood flow to the testicles.  COMPARISON:  None.  FINDINGS: Right testicle  Measurements: 4.8 x 2.6 x 3.6 cm. No mass or microlithiasis visualized.  Left testicle  Measurements: 4.3 x 1.9 x 2.9 cm. No mass or microlithiasis visualized.  Right epididymis: Cystic  area in the right epididymis is noted measuring 7 mm.  Left epididymis: A 1.5 cm heterogeneous mass is noted within the tail of the left epididymis.  Hydrocele:  Small left hydrocele  Varicocele:  None visualized.  Pulsed Doppler interrogation of both testes demonstrates normal low resistance arterial and venous waveforms bilaterally.  IMPRESSION: Heterogeneous mass in the region of the tail of the left epididymis. It demonstrates some slight increased echogenicity and may simply represent a mildly complicated lipoma of the epididymis. The possibility of an adenomatoid tumor or extratesticular neoplasm deserves consideration as well.   Electronically Signed   By: Alcide Clever M.D.   On: 04/08/2017 15:58  Scrotal ultrasound was personally reviewed today.  Assessment & Plan:    1. Scrotal mass Approximate 2 cm left scrotal mass which appears to be arising from the tail of the left epididymis We discussed the findings by telephone today following the ultrasound.  Lesions arising from  outside of the testicle itself are most likely benign but I would recommend surgical excision for primarily diagnostic reasons to rule out underlying pathology. We discussed proceeding with left partial versus total epididymectomy and will likely use in inguinal approach as a precaution. Risk of surgery discussed with the patient in detail including risk of bleeding, infection, hematoma, chronic testicular pain, testicular loss amongst others. All questions were answered. Postop course was reviewed.  arising- [place] Art/Ven Flow Abd Pelv Doppler - US Scrotum  Schedule surgery as above  [name], MD  [facility] [address], [address] [place], [place] [address] [phone]

## 2017-04-10 NOTE — Discharge Instructions (Signed)
Orchiectomy, Care After °This sheet gives you information about how to care for yourself after your procedure. Your health care provider may also give you more specific instructions. If you have problems or questions, contact your health care provider. °What can I expect after the procedure? °After the procedure, it is common to have: °· Pain. °· Bruising. °· Blood pooling (hematoma) in the area where your testicles were removed. °· Depression or mood changes. °· Fatigue. °· Hot flashes. ° °Follow these instructions at home: °Managing pain and swelling °· If directed, put ice on the affected area: °? Put ice in a plastic bag. °? Place a towel between your skin and the bag. °? Leave the ice on for 20 minutes, 2-3 times a day. °· Wear scrotal support as told by your health care provider. °· To relieve pressure and pain when sitting, you may use a donut cushion if directed by your health care provider. °Incision care °· Follow instructions from your health care provider about how to take care of your incision. Make sure you: °? Wash your hands with soap and water before you change your bandage (dressing). If soap and water are not available, use hand sanitizer. °? Change your dressing once a day, or as often as told by your health care provider. If the dressing sticks to your incision area: °? Use warm, soapy water or hydrogen peroxide to dampen the dressing. °? When the dressing becomes loose, lift it from the incision area. Make sure that the incision stays closed. °? Leave stitches (sutures), skin glue, or adhesive strips in place. These skin closures may need to stay in place for 2 weeks or longer. If adhesive strip edges start to loosen and curl up, you may trim the loose edges. Do not remove adhesive strips completely unless your health care provider tells you to do that. °· Keep your dressing dry until it has been removed. °· Check your incision area every day for signs of infection. Check for: °? More redness,  swelling, or pain. °? More fluid or blood. °? Warmth. °? Pus or a bad smell. °Bathing °· Do not take baths, swim, or use a hot tub until your health care provider approves. You may start taking showers two days after your procedure. °· Do not rub your incision to dry it. Pat the area gently with a clean cloth or let it air-dry. °Medicines °· Take over-the-counter and prescription medicines only as told by your health care provider. °· If you were prescribed an antibiotic medicine, use it as told by your health care provider. Do not stop using the antibiotic even if you start to feel better. °· If you had both testicles removed, talk with your health care provider about medicine supplements to replace one of the male hormones (testosterone) that your body will no longer make. °Driving °· Do not drive for 24 hours if you were given a medicine to help you relax (sedative). °· Do not drive or use heavy machinery while taking prescription pain medicines. °Activity °· Avoid activities that may cause your incision to open, such as jogging, playing sports, and straining with bowel movements. Ask your health care provider what activities are safe for you. °· Do not lift anything that is heavier than 10 lb (4.5 kg), or the limit that your health care provider tells you, until he or she says that it is safe. °· Do not engage in sexual activity until the area is healed and your health care provider approves.   This could take up to 4 weeks. °General instructions °· To prevent or treat constipation while you are taking prescription pain medicine, your health care provider may recommend that you: °? Drink enough fluid to keep your urine clear or pale yellow. °? Take over-the-counter or prescription medicines. °? Eat foods that are high in fiber, such as fresh fruits and vegetables, whole grains, and beans. °? Limit foods that are high in fat and processed sugars, such as fried and sweet foods. °· Do not use any products that  contain nicotine or tobacco, such as cigarettes and e-cigarettes. If you need help quitting, ask your health care provider. °· Keep all follow-up visits as told by your health care provider. This is important. °Contact a health care provider if: °· You have more pain, swelling, or redness in your genital or groin area. °· You have more fluid or blood coming from your incision. °· Your incision feels warm to the touch. °· You have pus or a bad smell coming from your incision. °· You have constipation that is not helped by changing your diet or drinking more fluid. °· You develop nausea or vomiting. °· You cannot eat or drink without vomiting. °Get help right away if: °· You have dizziness or nausea that does not go away. °· You have trouble breathing. °· You have a wet (congested) cough. °· You have a fever or shaking chills. °· Your incision breaks open after the skin closures have been removed. °· You are not able to urinate. °Summary °· After this procedure, it is most common to have bruising or blood pooling in the area where the testicles were removed. °· You should check your incision area every day for signs of infection, such as redness, swelling, pain, fluid, blood, warmth, pus, or a bad smell. °· Avoid activities that may cause your incision to open, such as jogging, playing sports, and straining with bowel movements. Ask your health care provider what activities are safe for you. °· You should not engage in sexual activity until the area is healed and your health care provider approves. This could take up to 4 weeks. °· Men who have both testicles removed may have emotional and physical side effects. Your health care provider can help you with ways to manage those side effects. °This information is not intended to replace advice given to you by your health care provider. Make sure you discuss any questions you have with your health care provider. °Document Released: 08/05/2013 Document Revised: 10/25/2016  Document Reviewed: 10/25/2016 °Elsevier Interactive Patient Education © 2017 [employer] ° ° ° °AMBULATORY SURGERY  °DISCHARGE INSTRUCTIONS ° ° °1) The drugs that you were given will stay in your system until tomorrow so for the next 24 hours you should not: ° °A) Drive an automobile °B) Make any legal decisions °C) Drink any alcoholic beverage ° ° °2) You may resume regular meals tomorrow.  Today it is better to start with liquids and gradually work up to solid foods. ° °You may eat anything you prefer, but it is better to start with liquids, then soup and crackers, and gradually work up to solid foods. ° ° °3) Please notify your doctor immediately if you have any unusual bleeding, trouble breathing, redness and pain at the surgery site, drainage, fever, or pain not relieved by medication. ° ° ° °4) Additional Instructions: ° ° ° ° ° ° ° °Please contact your physician with any problems or Same Day Surgery at [phone], Monday through   Friday 6 am to 4 pm, or [place] [facility] [place] Main number at [phone]. ° °

## 2017-04-11 ENCOUNTER — Encounter: Payer: Self-pay | Admitting: Urology

## 2017-04-15 ENCOUNTER — Telehealth: Payer: Self-pay

## 2017-04-15 LAB — SURGICAL PATHOLOGY

## 2017-04-15 NOTE — Telephone Encounter (Signed)
Spoke with pt in reference to pathology results and lesion. Pt voiced understanding.

## 2017-04-15 NOTE — Telephone Encounter (Signed)
-----   Message from [name], MD sent at 04/15/2017 10:14 AM EDT ----- Please let this patient know that his pathology came back without evidence of malignancy/cancer.  The lesion in the epididymis was likely a ruptured sperm duct which had become inflamed.    [name], MD

## 2017-05-08 ENCOUNTER — Ambulatory Visit (INDEPENDENT_AMBULATORY_CARE_PROVIDER_SITE_OTHER): Payer: Medicare Other | Admitting: Urology

## 2017-05-08 ENCOUNTER — Encounter: Payer: Self-pay | Admitting: Urology

## 2017-05-08 VITALS — BP 159/100 | HR 62 | Ht 74.0 in | Wt 175.0 lb

## 2017-05-08 DIAGNOSIS — N5089 Other specified disorders of the male genital organs: Secondary | ICD-10-CM

## 2017-05-08 DIAGNOSIS — N509 Disorder of male genital organs, unspecified: Secondary | ICD-10-CM

## 2017-05-08 NOTE — Progress Notes (Signed)
05/08/2017 9:53 AM   Iris Pertoger P Pruitt Jr. [birth year] [id number]  Referring provider: Lauro RegulusAnderson, Marshall W, MD [address] [facility] [place], [place] [id number]  Chief Complaint  Patient presents with  . Routine Post Op    1 month wound check    HPI: 71 year old male who returns today following left partial epididymectomy on 04/10/2017. He initially presented with a left scrotal mass of unclear etiology. After reviewing his options, he elected to proceed with surgical excision. He was taken to the operating room on 04/10/2017 for an inguinal approach left partial epididymectomy.  Surgical pathology consistent with ruptured dilated epididymal duct distended with spermatozoa and debris with adjacent interstitial inflammatory reaction. There is no evidence of malignancy.  Postoperatively, he is done quite well. He denies any testicular pain or residual scrotal edema. His left subinguinal incision is healing well. He has no urinary complaints today.   PMH: Past Medical History:  Diagnosis Date  . Anxiety    takes Xanax daily as needed  . Arthritis    knees  . Back pain   . Chronic back pain   . Diverticulosis   . GERD (gastroesophageal reflux disease)    OTC meds as needed  . History of colon polyps    benign   . History of kidney stones   . History of migraine    takes Imitrex daily as needed.Last one 2 wks ago  . Hyperlipidemia    hx of-has been off of meds for about a yr  . Insomnia    only recent d/t pain  . Joint pain     Surgical History: Past Surgical History:  Procedure Laterality Date  . ANKLE SURGERY Right 1997   ankles/screws   . ARTHROSCOPIC REPAIR ACL Right 1993  . BACK SURGERY  2017   titanium rods and pins in lower back  . COLONOSCOPY    . EPIDIDYMECTOMY Left 04/10/2017   Procedure: EPIDIDYMECTOMY;  Surgeon: Vanna ScotlandAshley Aundra Pung, MD;  Location: [facility] ORS;  Service: Urology;  Laterality: Left;  . ESOPHAGOGASTRODUODENOSCOPY    . LUMBAR  FUSION  March 13, 2016   L4/L5 fusion  . TONSILLECTOMY  1962   adenoidectomy   . VASECTOMY  1973    Home Medications:  Allergies as of 05/08/2017      Reactions   Codeine Nausea And Vomiting, Other (See Comments)      Medication List       Accurate as of 05/08/17  9:53 AM. Always use your most recent med list.          acetaminophen 650 MG CR tablet Commonly known as:  TYLENOL Take 1,300 mg by mouth 2 (two) times daily.   ALAWAY 0.025 % ophthalmic solution Generic drug:  ketotifen Place 1 drop into both eyes daily.   ALPRAZolam 0.25 MG tablet Commonly known as:  XANAX Take 0.25 mg by mouth at bedtime as needed for anxiety.   ASPERCREME W/LIDOCAINE 4 % cream Generic drug:  lidocaine Apply 1 application topically 2 (two) times daily as needed (pain).   carvedilol 6.25 MG tablet Commonly known as:  COREG Take 6.25 mg by mouth 2 (two) times daily with a meal.   DULoxetine 60 MG capsule Commonly known as:  CYMBALTA Take 60 mg by mouth daily after supper.   multivitamin with minerals Tabs tablet Take 1 tablet by mouth daily.   SUMAtriptan 50 MG tablet Commonly known as:  IMITREX Take 50 mg by mouth See admin instructions. Take  one tablet at the onset of headache. Repeat in 1hour if headache persists or recurs.   trolamine salicylate 10 % cream Commonly known as:  ASPERCREME Apply 1 application topically as needed for muscle pain.       Allergies:  Allergies  Allergen Reactions  . Codeine Nausea And Vomiting and Other (See Comments)    Family History: Family History  Problem Relation Age of Onset  . Prostate cancer Neg Hx   . Bladder Cancer Neg Hx   . Kidney cancer Neg Hx     Social History:  reports that he has been smoking Cigarettes.  He has a 12.50 pack-year smoking history. He has never used smokeless tobacco. He reports that he does not drink alcohol or use drugs.  ROS: UROLOGY Frequent Urination?: No Hard to postpone urination?:  No Burning/pain with urination?: No Get up at night to urinate?: No Leakage of urine?: No Urine stream starts and stops?: No Trouble starting stream?: No Do you have to strain to urinate?: No Blood in urine?: No Urinary tract infection?: No Sexually transmitted disease?: No Injury to kidneys or bladder?: No Painful intercourse?: No Weak stream?: No Erection problems?: No Penile pain?: No  Gastrointestinal Nausea?: No Vomiting?: No Indigestion/heartburn?: No Diarrhea?: No Constipation?: No  Constitutional Fever: No Night sweats?: No Weight loss?: No Fatigue?: No  Skin Skin rash/lesions?: No Itching?: No  Eyes Blurred vision?: No Double vision?: No  Ears/Nose/Throat Sore throat?: No Sinus problems?: No  Hematologic/Lymphatic Swollen glands?: No Easy bruising?: No  Cardiovascular Leg swelling?: No Chest pain?: No  Respiratory Cough?: No Shortness of breath?: No  Endocrine Excessive thirst?: No  Musculoskeletal Back pain?: Yes Joint pain?: No  Neurological Headaches?: No Dizziness?: No  Psychologic Depression?: No Anxiety?: Yes  Physical Exam: BP (!) 159/100   Pulse 62   Ht 6\' 2"  (1.88 m)   Wt 175 lb (79.4 kg)   BMI 22.47 kg/m   Constitutional:  Alert and oriented, No acute distress. HEENT: [place] AT, moist mucus membranes.  Trachea midline, no masses. Cardiovascular: No clubbing, cyanosis, or edema. Respiratory: Normal respiratory effort, no increased work of breathing. GI: Abdomen is soft, nontender, nondistended, no abdominal masses.  Left subinguinal incision healing well. GU: Bilateral descended testicles, nontender no masses. No scrotal edema or skin changes. Skin: No rashes, bruises or suspicious lesions. Lymph: No inguinal adenopathy. Neurologic: Grossly intact, no focal deficits, moving all 4 extremities. Psychiatric: Normal mood and affect.  Laboratory Data: Lab Results  Component Value Date   WBC 7.3 03/07/2016   HGB 13.5  03/07/2016   HCT 40.9 03/07/2016   MCV 93.6 03/07/2016   PLT 219 03/07/2016    Lab Results  Component Value Date   CREATININE 0.83 03/07/2016    Assessment & Plan:    1. Scrotal mass Status post surgical excision of left epididymal mass consistent with ruptured sperm granuloma Healing well, no postop issues F/u prn  Return if symptoms worsen or fail to improve.  [name], MD  [facility] [address], [address] [place], [place] [address] [phone]

## 2017-05-13 ENCOUNTER — Telehealth: Payer: Medicare Other | Admitting: Family

## 2017-05-13 DIAGNOSIS — R197 Diarrhea, unspecified: Secondary | ICD-10-CM

## 2017-05-13 NOTE — Progress Notes (Signed)
Thank you for the details you put in the comment boxes. Those details really help us take better care of you. Based on what you shared, I am deeply concerned about your situation.. There are numerous causes of loose stools. Cymbalta typically would not be one of them, although occasionally, it is possible. Given that you have been on cymbalta since 04/08/17, you cannot suddenly stop it without the risk of serious withdrawal symptoms as it has reached serum therapeutic levels. In other words, I cannot advise you to stop taking it suddenly without serious risk to your health. It would have to be slowly tapered while treating the loose stools as we still do not know the cause of the loose stools. Often, we correlate the onset of something like this (loose stools) with a specific event and it appears to be related (cymbalta), yet is actually an entirely different cause.  That being said, to truly diagnose you properly and take good care of you, you need to be seen face-to-face. Wiith this many persisting loose stools, you run the risk of dehydration and, given that you are over the age of 70, there is also a risk to your kidney function. You should ideally be seen at Urgent Care today.  Based on what you shared with me it looks like you have a serious condition that should be evaluated in a face to face office visit.  NOTE: Even if you have entered your credit card information for this eVisit, you will not be charged.   If you are having a true medical emergency please call 911.  If you need an urgent face to face visit, [facility] has four urgent care centers for your convenience.  If you need care fast and have a high deductible or no insurance consider:   [email or web address]  [phone]  [address], [address] [place], [place] [address] 8 am to 8 pm Monday-Friday 10 am to 4 pm Saturday-Sunday   The following sites will take your  insurance:    . [facility]  [phone] Get Driving Directions Find a Provider at this Location  [address] [place], [place] [address] . 10 am to 8 pm Monday-Friday . 12 pm to 8 pm Saturday-Sunday   . [facility]  [phone] Get Driving Directions Find a Provider at this Location  [address], [address] [place], [place] [address] . 8 am to 8 pm Monday-Friday . 9 am to 6 pm Saturday . 11 am to 6 pm Sunday   . [facility]  [phone] Get Driving Directions  [address] [place], [place] [address] . 8 am to 8 pm Monday-Friday . 8 am to 4 pm Saturday-Sunday   Your e-visit answers were reviewed by a board certified advanced clinical practitioner to complete your personal care plan.  Thank you for using e-Visits.

## 2017-08-01 DIAGNOSIS — R7303 Prediabetes: Secondary | ICD-10-CM | POA: Insufficient documentation

## 2018-02-04 ENCOUNTER — Other Ambulatory Visit: Payer: Self-pay | Admitting: Internal Medicine

## 2018-02-04 DIAGNOSIS — R413 Other amnesia: Secondary | ICD-10-CM

## 2018-02-13 ENCOUNTER — Ambulatory Visit
Admission: RE | Admit: 2018-02-13 | Discharge: 2018-02-13 | Disposition: A | Discharge status: Home / Self Care | Payer: Medicare Other | Source: Ambulatory Visit | Attending: Internal Medicine | Admitting: Internal Medicine

## 2018-02-13 DIAGNOSIS — R413 Other amnesia: Secondary | ICD-10-CM | POA: Insufficient documentation

## 2018-02-13 LAB — POCT I-STAT CREATININE: CREATININE: 0.9 mg/dL (ref 0.61–1.24)

## 2018-02-13 MED ORDER — GADOBENATE DIMEGLUMINE 529 MG/ML IV SOLN
15.0000 mL | Freq: Once | INTRAVENOUS | Status: AC | PRN
  Administered 2018-02-13: 15 mL via INTRAVENOUS

## 2018-03-03 DIAGNOSIS — Z8673 Personal history of transient ischemic attack (TIA), and cerebral infarction without residual deficits: Secondary | ICD-10-CM | POA: Insufficient documentation

## 2018-12-10 IMAGING — US US ART/VEN ABD/PELV/SCROTUM DOPPLER LTD
1 series · 13 of 25 positions shown · non-contrast
Comparison: None.

CLINICAL DATA: Left scrotal mass for several months

EXAM:
SCROTAL ULTRASOUND
DOPPLER ULTRASOUND OF THE TESTICLES
TECHNIQUE: Complete ultrasound examination of the testicles, epididymis, and
other scrotal structures was performed. Color and spectral Doppler
ultrasound were also utilized to evaluate blood flow to the
testicles.

[Series 1: us art/ven abd/pelv/scrotum doppler ltd · 96 acquisitions, 13 frames shown]
[im 1/96]
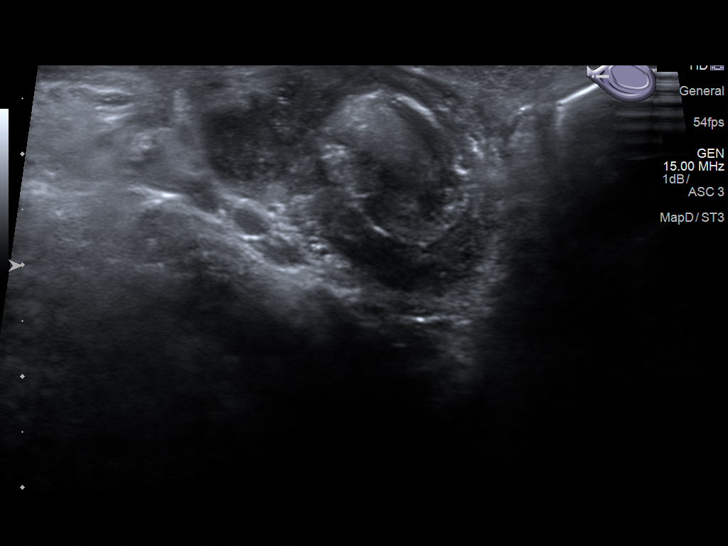
[im 8/96]
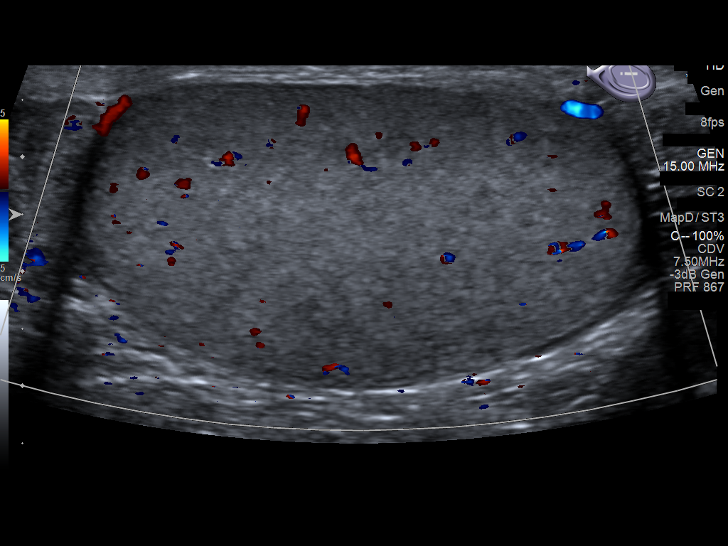
[im 16/96]
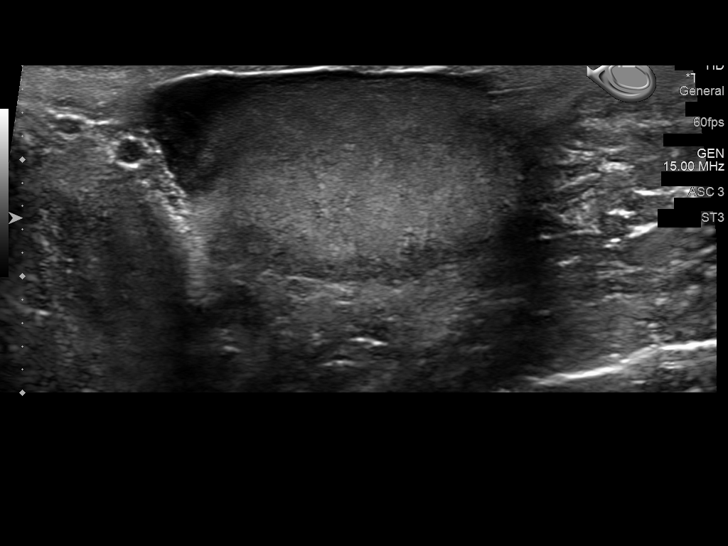
[im 24/96]
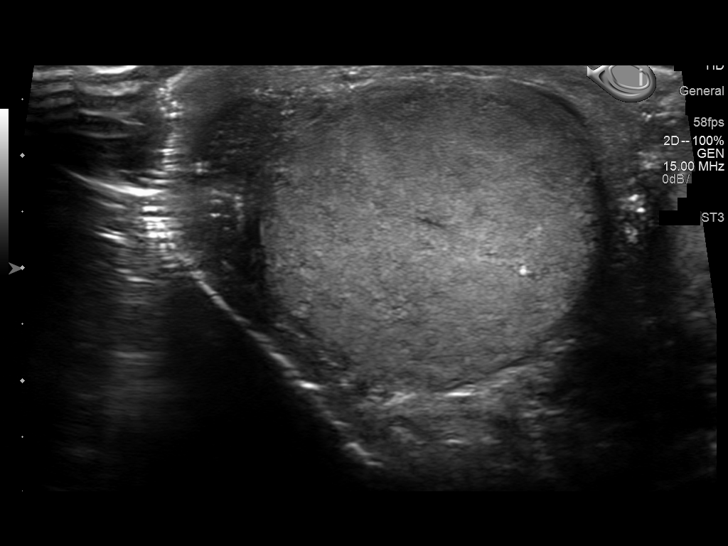
[im 32/96]
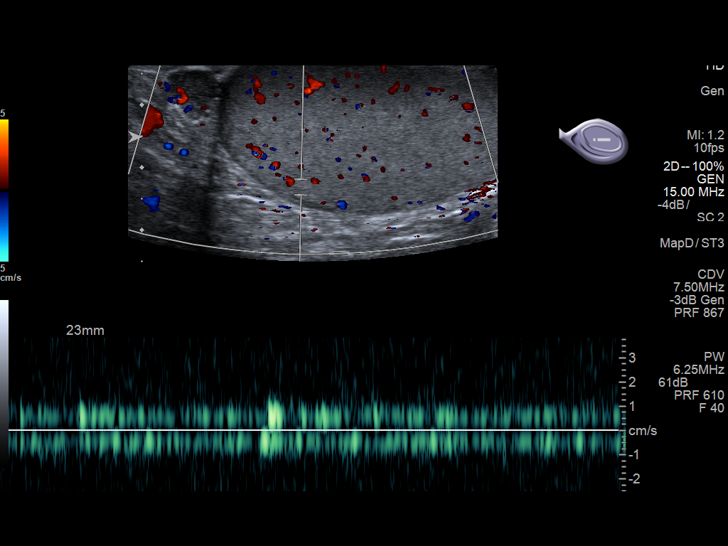
[im 40/96]
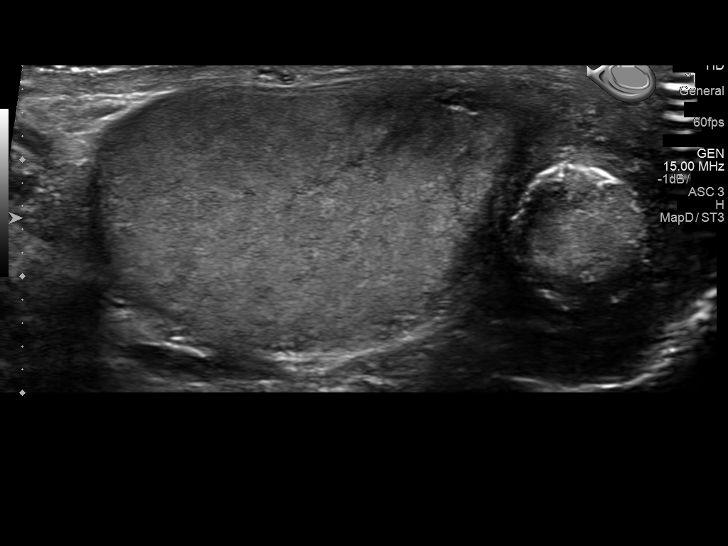
[im 48/96]
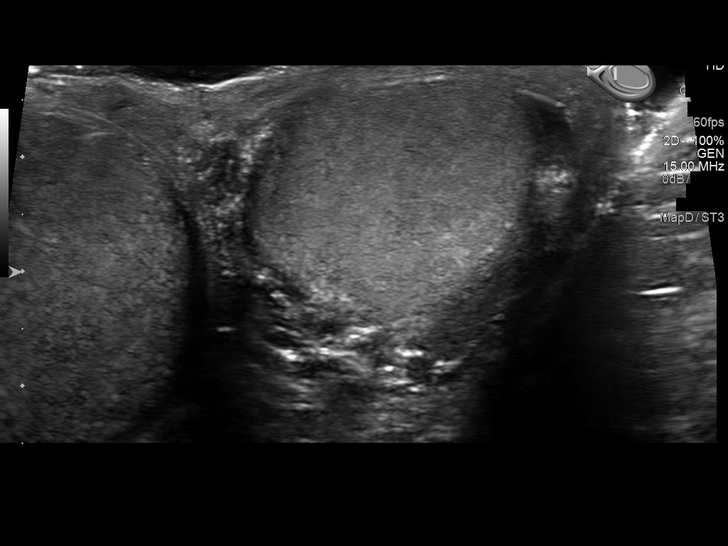
[im 56/96]
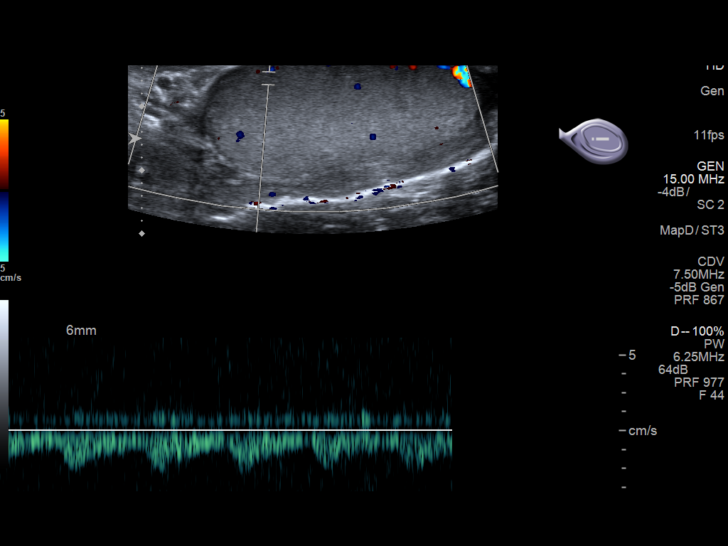
[im 64/96]
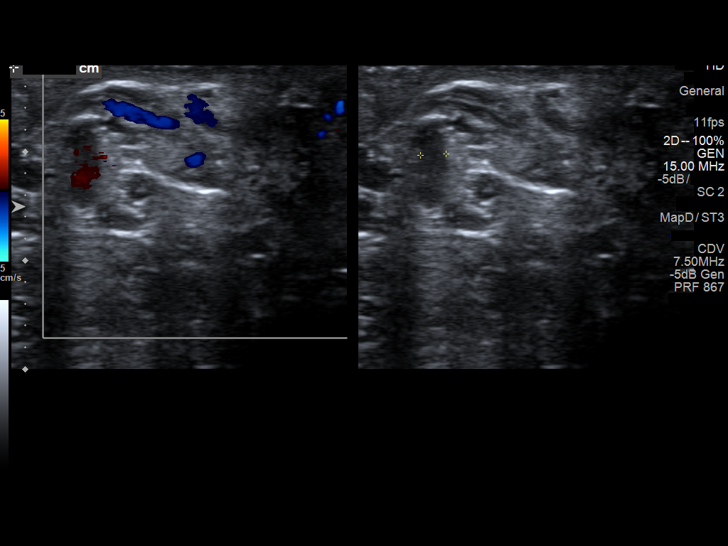
[im 72/96]
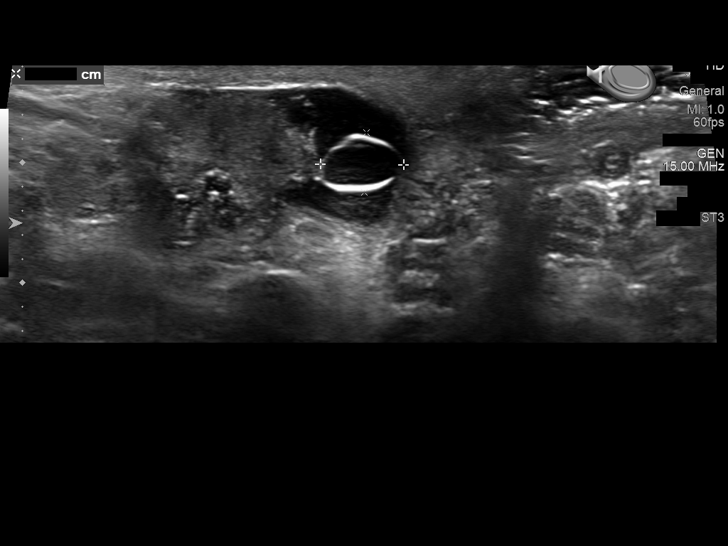
[im 80/96]
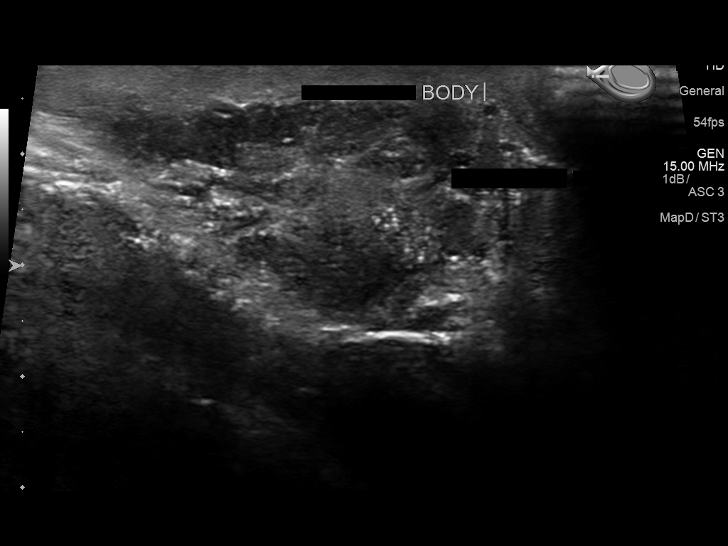
[im 88/96]
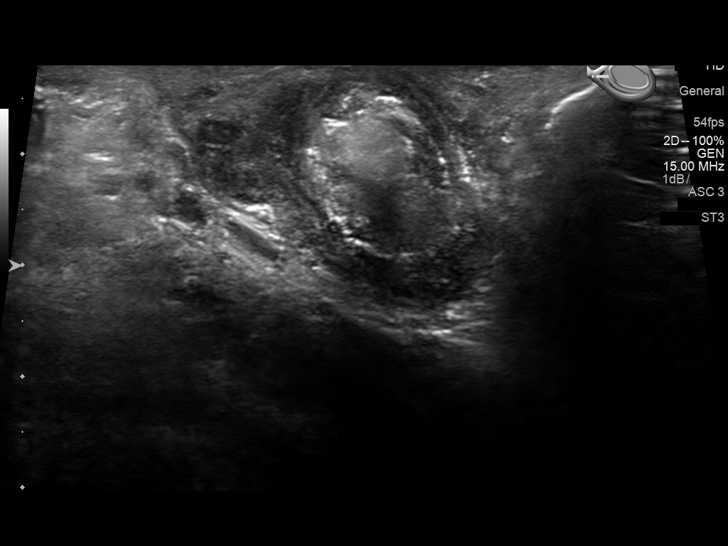
[im 96/96]
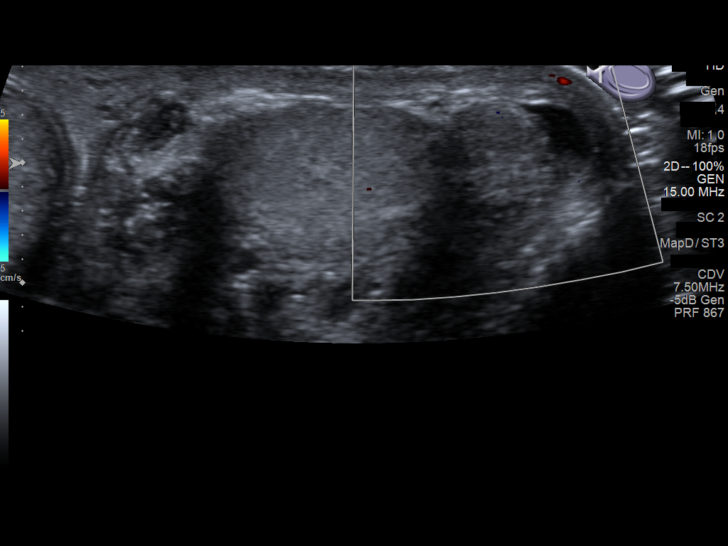

[13 of 25 positions shown; findings below may reference images not displayed]

FINDINGS: Right testicle

Measurements: 4.8 x 2.6 x 3.6 cm.. No mass or microlithiasis
visualized.

Left testicle

Measurements: 4.3 x 1.9 x 2.9 cm.. No mass or microlithiasis
visualized.

Right epididymis: Cystic area in the right epididymis is noted
measuring 7 mm.

Left epididymis: A 1.5 cm heterogeneous mass is noted within the
tail of the left epididymis.

Hydrocele:  Small left hydrocele

Varicocele:  None visualized.

Pulsed Doppler interrogation of both testes demonstrates normal low
resistance arterial and venous waveforms bilaterally.
IMPRESSION: Heterogeneous mass in the region of the tail of the left epididymis.
It demonstrates some slight increased echogenicity and may simply
represent a mildly complicated lipoma of the epididymis. The
possibility of an adenomatoid tumor or extratesticular neoplasm
deserves consideration as well.

## 2019-07-21 ENCOUNTER — Ambulatory Visit: Payer: Self-pay | Admitting: Podiatry

## 2019-07-30 ENCOUNTER — Encounter: Payer: Self-pay | Admitting: Podiatry

## 2019-07-30 ENCOUNTER — Ambulatory Visit (INDEPENDENT_AMBULATORY_CARE_PROVIDER_SITE_OTHER): Payer: Medicare Other | Admitting: Podiatry

## 2019-07-30 ENCOUNTER — Other Ambulatory Visit: Payer: Self-pay

## 2019-07-30 ENCOUNTER — Ambulatory Visit (INDEPENDENT_AMBULATORY_CARE_PROVIDER_SITE_OTHER): Payer: Medicare Other

## 2019-07-30 VITALS — BP 145/86 | HR 66 | Temp 98.2°F

## 2019-07-30 DIAGNOSIS — S99921A Unspecified injury of right foot, initial encounter: Secondary | ICD-10-CM

## 2019-07-30 DIAGNOSIS — S92511A Displaced fracture of proximal phalanx of right lesser toe(s), initial encounter for closed fracture: Secondary | ICD-10-CM | POA: Insufficient documentation

## 2019-07-30 NOTE — Progress Notes (Addendum)
This patient presents the office with chief complaint of occasional pinky toe pain right foot.  He says he injured his fifth toe several months ago after hitting his toe against the coffee table.  He says it is not painful every day but there is occasional pain and discomfort.  He has provided no self treatment or sought any professional help.  He presents the office today for an evaluation and treatment of this painful fifth toe right foot. Patient is takng plavix.  Vascular  Dorsalis pedis and posterior tibial pulses are palpable  B/L.  Capillary return  WNL.  Temperature gradient is  WNL.  Skin turgor  WNL  Sensorium  Senn Weinstein monofilament wire  WNL. Normal tactile sensation.  Nail Exam  Patient has normal nails with no evidence of bacterial or fungal infection.  Orthopedic  Exam  Muscle tone and muscle strength  WNL.  No limitations of motion feet  B/L.  No crepitus or joint effusion noted.  Palpable pain at PIPJ 5th toe right foot.  Bony prominences are unremarkable.  Skin  No open lesions.  Normal skin texture and turgor.  S/P toe fracture fifth toe right foot.  IE.  X-rays were taken revealing an intra-articular fracture that has healed at the level of the PIPJ fifth toe right foot.  Discussed this condition with this patient.  Told him that it is healing and that we need to just allow it to completely heal and then evaluate the joint in the future.  Patient was dispensed padding to help to relieve the pain that this patient is experiencing. RTC prn  Gardiner Barefoot DPM

## 2020-01-31 ENCOUNTER — Ambulatory Visit: Payer: Medicare Other

## 2020-02-01 ENCOUNTER — Ambulatory Visit: Payer: Medicare Other | Attending: Internal Medicine

## 2020-02-01 ENCOUNTER — Other Ambulatory Visit: Payer: Self-pay

## 2020-02-01 DIAGNOSIS — Z23 Encounter for immunization: Secondary | ICD-10-CM | POA: Insufficient documentation

## 2020-02-01 NOTE — Progress Notes (Signed)
   [id number]  Name:  Albert [name]    MRN: [id number] DOB: [birth year]  02/01/2020  [name] was observed post Covid-19 immunization for 15 minutes without incidence. He was provided with Vaccine Information Sheet and instruction to access the V-Safe system.   [name] was instructed to call 911 with any severe reactions post vaccine: Marland Kitchen Difficulty breathing  . Swelling of your face and throat  . A fast heartbeat  . A bad rash all over your body  . Dizziness and weakness    Immunizations Administered    Name Date Dose VIS Date Route   Pfizer COVID-19 Vaccine 02/01/2020  9:24 AM 0.3 mL 11/27/2019 Intramuscular   Manufacturer: [employer], [employer]   Lot: [id number]   NDC: [address]-3

## 2020-02-22 ENCOUNTER — Encounter: Payer: Self-pay | Admitting: Podiatry

## 2020-02-22 ENCOUNTER — Other Ambulatory Visit: Payer: Self-pay

## 2020-02-22 ENCOUNTER — Ambulatory Visit: Payer: Medicare Other | Admitting: Podiatry

## 2020-02-22 VITALS — Temp 97.3°F

## 2020-02-22 DIAGNOSIS — S99921A Unspecified injury of right foot, initial encounter: Secondary | ICD-10-CM | POA: Diagnosis not present

## 2020-02-22 DIAGNOSIS — S92919A Unspecified fracture of unspecified toe(s), initial encounter for closed fracture: Secondary | ICD-10-CM | POA: Insufficient documentation

## 2020-02-22 DIAGNOSIS — S92514S Nondisplaced fracture of proximal phalanx of right lesser toe(s), sequela: Secondary | ICD-10-CM

## 2020-02-22 NOTE — Progress Notes (Signed)
this patient returns to the office for continued evaluation of a painful fifth toe right foot.  Patient states that his fifth toe is painful wearing his shoes.  He previously was diagnosed with an intra-articular fracture that is healed at the level of the PIPJ fifth toe right foot.  He says this is painful and he presents the office today for continued evaluation and treatment since pain persists.  Vascular  Dorsalis pedis and posterior tibial pulses are palpable  B/L.  Capillary return  WNL.  Temperature gradient is  WNL.  Skin turgor  WNL  Sensorium  Senn Weinstein monofilament wire  WNL. Normal tactile sensation.  Nail Exam  Patient has normal nails with no evidence of bacterial or fungal infection.  Orthopedic  Exam  Muscle tone and muscle strength  WNL.  No limitations of motion feet  B/L.  No crepitus or joint effusion noted.  Foot type is unremarkable and digits show no abnormalities.  Tailors bunion fifth metatarsal right foot.  Hammer toe fifth toe right foot.  Palpable pain dorsolateral aspect head proximal phalanx right foot.  Skin  No open lesions.  Normal skin texture and turgor.  S/P Fifth toe fracture right foot.  ROV.  Discussed this condition with this patient.  Clinically there is no evidence of any redness swelling or inflammation at the fifth toe site.  Therefore no x-ray was taken.  Palpable pain noted at the dorsal lateral aspect of the head of the proximal phalanx fifth digit right foot.  Discussed injection therapy versus surgery with this patient.  He chose to have injection therapy in his fifth toe to help to relieve the pain.  If the pain returns he is to make an appointment with Dr. Allena Katz for surgical correction.   Helane Gunther DPM

## 2020-03-02 ENCOUNTER — Ambulatory Visit: Payer: Medicare Other | Attending: Internal Medicine

## 2020-03-02 DIAGNOSIS — Z23 Encounter for immunization: Secondary | ICD-10-CM

## 2020-03-02 NOTE — Progress Notes (Signed)
   [id number]  Name:  Albert [name]    MRN: [id number] DOB: 08-Oct-1946  03/02/2020  [name] [name] was observed post Covid-19 immunization for 15 minutes without incident. He was provided with Vaccine Information Sheet and instruction to access the V-Safe system.   [name] [name] was instructed to call 911 with any severe reactions post vaccine: Marland Kitchen Difficulty breathing  . Swelling of face and throat  . A fast heartbeat  . A bad rash all over body  . Dizziness and weakness   Immunizations Administered    Name Date Dose VIS Date Route   Pfizer COVID-19 Vaccine 03/02/2020  2:58 PM 0.3 mL 11/27/2019 Intramuscular   Manufacturer: [employer], [employer]   Lot: [id number]   NDC: [address]-2

## 2020-03-15 ENCOUNTER — Ambulatory Visit: Payer: Self-pay | Admitting: Podiatry

## 2020-06-24 DIAGNOSIS — F0390 Unspecified dementia without behavioral disturbance: Secondary | ICD-10-CM | POA: Insufficient documentation

## 2020-06-24 DIAGNOSIS — G309 Alzheimer's disease, unspecified: Secondary | ICD-10-CM | POA: Insufficient documentation

## 2020-07-05 ENCOUNTER — Ambulatory Visit: Payer: Medicare Other | Admitting: Podiatry

## 2020-07-05 ENCOUNTER — Ambulatory Visit (INDEPENDENT_AMBULATORY_CARE_PROVIDER_SITE_OTHER): Payer: Medicare Other

## 2020-07-05 ENCOUNTER — Other Ambulatory Visit: Payer: Self-pay

## 2020-07-05 DIAGNOSIS — S99921A Unspecified injury of right foot, initial encounter: Secondary | ICD-10-CM

## 2020-07-05 DIAGNOSIS — S92514S Nondisplaced fracture of proximal phalanx of right lesser toe(s), sequela: Secondary | ICD-10-CM

## 2020-07-05 DIAGNOSIS — M7751 Other enthesopathy of right foot: Secondary | ICD-10-CM | POA: Diagnosis not present

## 2020-07-06 ENCOUNTER — Encounter: Payer: Self-pay | Admitting: Podiatry

## 2020-07-06 NOTE — Progress Notes (Signed)
Subjective:  Patient ID: Albert [name], male    DOB: 07/29/1946,  MRN: [id number]  Chief Complaint  Patient presents with  . Foot Pain    pt is here for right foot pain fifth toe, pt states that the pain has recently come back, and is looking to get it looked at.    74 y.o. male presents with the above complaint.  Patient presents here with a follow-up from Dr. Stacie Acres for right fifth digit PIPJ fracture intra-articular.  Patient states that there is still pain associated with it.  Patient was being primarily managed by Dr. Stacie Acres with a steroid injection which seems to have helped for couple of months.  Patient states the pain started coming back recently.  He states the fracture has been causing a lot of pain inflammation.  He would like to discuss surgical options with me.  He denies any other acute complaints.  He is ambulating in surgical shoe.   Review of Systems: Negative except as noted in the HPI. Denies N/V/F/Ch.  Past Medical History:  Diagnosis Date  . Anxiety    takes Xanax daily as needed  . Arthritis    knees  . Back pain   . Chronic back pain   . Diverticulosis   . GERD (gastroesophageal reflux disease)    OTC meds as needed  . History of colon polyps    benign   . History of kidney stones   . History of migraine    takes Imitrex daily as needed.Last one 2 wks ago  . Hyperlipidemia    hx of-has been off of meds for about a yr  . Insomnia    only recent d/t pain  . Joint pain     Current Outpatient Medications:  .  acetaminophen (TYLENOL) 325 MG tablet, Take by mouth., Disp: , Rfl:  .  ALPRAZolam (XANAX) 0.25 MG tablet, TAKE 1 TABLET BY MOUTH EVERY DAY AS NEEDED FOR SLEEP, Disp: , Rfl:  .  amLODipine (NORVASC) 10 MG tablet, TAKE 1 TABLET BY MOUTH EVERY DAY, Disp: , Rfl:  .  aspirin EC 81 MG tablet, Take by mouth., Disp: , Rfl:  .  carvedilol (COREG) 6.25 MG tablet, Take 6.25 mg by mouth 2 (two) times daily with a meal., Disp: , Rfl:  .  clopidogrel  (PLAVIX) 75 MG tablet, , Disp: , Rfl:  .  Ibuprofen (ADVIL PO), Take by mouth., Disp: , Rfl:  .  ketotifen (ALAWAY) 0.025 % ophthalmic solution, Place 1 drop into both eyes daily., Disp: , Rfl:  .  lidocaine (ASPERCREME W/LIDOCAINE) 4 % cream, Apply 1 application topically 2 (two) times daily as needed (pain)., Disp: , Rfl:  .  Multiple Vitamin (MULTIVITAMIN WITH MINERALS) TABS tablet, Take 1 tablet by mouth daily., Disp: , Rfl:  .  rosuvastatin (CRESTOR) 20 MG tablet, , Disp: , Rfl:  .  SUMAtriptan (IMITREX) 50 MG tablet, Take by mouth., Disp: , Rfl:  .  trolamine salicylate (ASPERCREME) 10 % cream, Apply 1 application topically as needed for muscle pain., Disp: , Rfl:  .  Vitamins/Minerals TABS, Take by mouth., Disp: , Rfl:  .  DULoxetine (CYMBALTA) 60 MG capsule, Take by mouth., Disp: , Rfl:   Social History   Tobacco Use  Smoking Status Current Every Day Smoker  . Packs/day: 0.25  . Years: 50.00  . Pack years: 12.50  . Types: Cigarettes  Smokeless Tobacco Never Used  Tobacco Comment   3 cigs per day  Allergies  Allergen Reactions  . Codeine Nausea And Vomiting and Other (See Comments)   Objective:  There were no vitals filed for this visit. There is no height or weight on file to calculate BMI. Constitutional Well developed. Well nourished.  Vascular Dorsalis pedis pulses palpable bilaterally. Posterior tibial pulses palpable bilaterally. Capillary refill normal to all digits.  No cyanosis or clubbing noted. Pedal hair growth normal.  Neurologic Normal speech. Oriented to person, place, and time. Epicritic sensation to light touch grossly present bilaterally.  Dermatologic Nails well groomed and normal in appearance. No open wounds. No skin lesions.  Orthopedic:  Mild pain on palpation to the right fifth digit at the level of the PIPJ.  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   Radiographs: 3 views of skeletally mature  adult right foot: Radiolucent line without callus formation noted to the right fifth digit fracture/capital fragment is in excellent position even though not quite healed/consolidated Assessment:   1. Toe injury, right, initial encounter   2. Closed nondisplaced fracture of proximal phalanx of lesser toe of right foot, sequela   3. Capsulitis of toe of right foot    Plan:  Patient was evaluated and treated and all questions answered.  Right fifth digit capsulitis with underlying intra-articular fracture stable -I explained patient the etiology of capsulitis and various treatment options were extensively discussed.  Given that patient has had 3 to 4 months of relief with the steroid injection I discussed with him that we can continue doing a steroid injection as opposed to surgical intervention.  Patient agrees with the plan would like to proceed with steroid injections for now.  If the injections become too frequent in nature if there is no resolve meant at that point we can think about doing surgical intervention with correction of the fifth digit as well as removal of the capital fragment.  Patient agrees with plan. -A steroid injection was performed at right fifth digit using 1% plain Lidocaine and 10 mg of Kenalog. This was well tolerated.   No follow-ups on file.

## 2020-08-30 ENCOUNTER — Encounter: Payer: Self-pay | Admitting: Podiatry

## 2020-08-30 ENCOUNTER — Ambulatory Visit: Payer: Medicare Other | Admitting: Podiatry

## 2020-08-30 ENCOUNTER — Other Ambulatory Visit: Payer: Self-pay

## 2020-08-30 DIAGNOSIS — S99921A Unspecified injury of right foot, initial encounter: Secondary | ICD-10-CM

## 2020-08-30 DIAGNOSIS — M2041 Other hammer toe(s) (acquired), right foot: Secondary | ICD-10-CM | POA: Diagnosis not present

## 2020-08-30 NOTE — Patient Instructions (Signed)
Pre-Operative Instructions  Congratulations, you have decided to take an important step towards improving your quality of life.  You can be assured that the doctors and staff at [facility] will be with you every step of the way.  Here are some important things you should know:  1. Plan to be at the surgery center/hospital at least 1 (one) hour prior to your scheduled time, unless otherwise directed by the surgical center/hospital staff.  You must have a responsible adult accompany you, remain during the surgery and drive you home.  Make sure you have directions to the surgical center/hospital to ensure you arrive on time. 2. If you are having surgery at [facility] or [facility] hospitals, you will need a copy of your medical history and physical form from your family physician within one month prior to the date of surgery. We will give you a form for your primary physician to complete.  3. We make every effort to accommodate the date you request for surgery.  However, there are times where surgery dates or times have to be moved.  We will contact you as soon as possible if a change in schedule is required.   4. No aspirin/ibuprofen for one week before surgery.  If you are on aspirin, any non-steroidal anti-inflammatory medications (Mobic, Aleve, Ibuprofen) should not be taken seven (7) days prior to your surgery.  You make take Tylenol for pain prior to surgery.  5. Medications - If you are taking daily heart and blood pressure medications, seizure, reflux, allergy, asthma, anxiety, pain or diabetes medications, make sure you notify the surgery center/hospital before the day of surgery so they can tell you which medications you should take or avoid the day of surgery. 6. No food or drink after midnight the night before surgery unless directed otherwise by surgical center/hospital staff. 7. No alcoholic beverages 24-hours prior to surgery.  No smoking 24-hours prior or 24-hours after  surgery. 8. Wear loose pants or shorts. They should be loose enough to fit over bandages, boots, and casts. 9. Don't wear slip-on shoes. Sneakers are preferred. 10. Bring your boot with you to the surgery center/hospital.  Also bring crutches or a walker if your physician has prescribed it for you.  If you do not have this equipment, it will be provided for you after surgery. 11. If you have not been contacted by the surgery center/hospital by the day before your surgery, call to confirm the date and time of your surgery. 12. Leave-time from work may vary depending on the type of surgery you have.  Appropriate arrangements should be made prior to surgery with your employer. 13. Prescriptions will be provided immediately following surgery by your doctor.  Fill these as soon as possible after surgery and take the medication as directed. Pain medications will not be refilled on weekends and must be approved by the doctor. 14. Remove nail polish on the operative foot and avoid getting pedicures prior to surgery. 15. Wash the night before surgery.  The night before surgery wash the foot and leg well with water and the antibacterial soap provided. Be sure to pay special attention to beneath the toenails and in between the toes.  Wash for at least three (3) minutes. Rinse thoroughly with water and dry well with a towel.  Perform this wash unless told not to do so by your physician.  Enclosed: 1 Ice pack (please put in freezer the night before surgery)   1 Hibiclens skin cleaner     Pre-op instructions  If you have any questions regarding the instructions, please do not hesitate to call our office.  [place]: [address], [place], [place] [address] -- [phone]  [place]: [address] [place], [place] [address] -- [phone]  [place]: [address], [place], [place] [address] -- [phone]   Website: [email or web address] 

## 2020-08-31 ENCOUNTER — Encounter: Payer: Self-pay | Admitting: Podiatry

## 2020-08-31 NOTE — Progress Notes (Signed)
Subjective:  Patient ID: Albert Guia., male    DOB: December 30, 1945,  MRN: [id number]  Chief Complaint  Patient presents with  . Toe Pain    surgical consult    74 y.o. male presents with the above complaint.  Patient presents with a follow-up from her right fifth digit PIPJ intra-articular fracture with underlying hammertoe contracture.  Patient states still continues to hurt.  The injection did not help him at all.  He would like to discuss various treatment options at this time.  Including correcting the hammertoe contracture.  He denies any other acute complaints.   Review of Systems: Negative except as noted in the HPI. Denies N/V/F/Ch.  Past Medical History:  Diagnosis Date  . Anxiety    takes Xanax daily as needed  . Arthritis    knees  . Back pain   . Chronic back pain   . Diverticulosis   . GERD (gastroesophageal reflux disease)    OTC meds as needed  . History of colon polyps    benign   . History of kidney stones   . History of migraine    takes Imitrex daily as needed.Last one 2 wks ago  . Hyperlipidemia    hx of-has been off of meds for about a yr  . Insomnia    only recent d/t pain  . Joint pain     Current Outpatient Medications:  .  acetaminophen (TYLENOL) 325 MG tablet, Take by mouth., Disp: , Rfl:  .  ALPRAZolam (XANAX) 0.25 MG tablet, TAKE 1 TABLET BY MOUTH EVERY DAY AS NEEDED FOR SLEEP, Disp: , Rfl:  .  amLODipine (NORVASC) 10 MG tablet, TAKE 1 TABLET BY MOUTH EVERY DAY, Disp: , Rfl:  .  aspirin EC 81 MG tablet, Take by mouth., Disp: , Rfl:  .  carvedilol (COREG) 6.25 MG tablet, Take 6.25 mg by mouth 2 (two) times daily with a meal., Disp: , Rfl:  .  clopidogrel (PLAVIX) 75 MG tablet, , Disp: , Rfl:  .  DULoxetine (CYMBALTA) 60 MG capsule, Take by mouth., Disp: , Rfl:  .  Ibuprofen (ADVIL PO), Take by mouth., Disp: , Rfl:  .  ketotifen (ALAWAY) 0.025 % ophthalmic solution, Place 1 drop into both eyes daily., Disp: , Rfl:  .  lidocaine (ASPERCREME  W/LIDOCAINE) 4 % cream, Apply 1 application topically 2 (two) times daily as needed (pain)., Disp: , Rfl:  .  Multiple Vitamin (MULTIVITAMIN WITH MINERALS) TABS tablet, Take 1 tablet by mouth daily., Disp: , Rfl:  .  rosuvastatin (CRESTOR) 20 MG tablet, , Disp: , Rfl:  .  SUMAtriptan (IMITREX) 50 MG tablet, Take by mouth., Disp: , Rfl:  .  trolamine salicylate (ASPERCREME) 10 % cream, Apply 1 application topically as needed for muscle pain., Disp: , Rfl:  .  Vitamins/Minerals TABS, Take by mouth., Disp: , Rfl:   Social History   Tobacco Use  Smoking Status Current Every Day Smoker  . Packs/day: 0.25  . Years: 50.00  . Pack years: 12.50  . Types: Cigarettes  Smokeless Tobacco Never Used  Tobacco Comment   3 cigs per day    Allergies  Allergen Reactions  . Codeine Nausea And Vomiting and Other (See Comments)   Objective:  There were no vitals filed for this visit. There is no height or weight on file to calculate BMI. Constitutional Well developed. Well nourished.  Vascular Dorsalis pedis pulses palpable bilaterally. Posterior tibial pulses palpable bilaterally. Capillary refill normal to all digits.  No cyanosis or clubbing noted. Pedal hair growth normal.  Neurologic Normal speech. Oriented to person, place, and time. Epicritic sensation to light touch grossly present bilaterally.  Dermatologic Nails well groomed and normal in appearance. No open wounds. No skin lesions.  Orthopedic:  Moderate pain on palpation to the right fifth digit at the level of the PIPJ.  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   Radiographs: 3 views of skeletally mature adult right foot: Radiolucent line without callus formation noted to the right fifth digit fracture/capital fragment is in excellent position even though not quite healed/consolidated Assessment:   1. Toe injury, right, initial encounter   2. Acquired hammertoe of right foot    Plan:    Patient was evaluated and treated and all questions answered.  Right fifth digit capsulitis with underlying intra-articular fracture stable with underlying fifth digit adductovarus hammertoe contracture -I explained patient the etiology of capsulitis and various treatment options were extensively discussed.  Given that the steroid injection did not help as well I believe patient will benefit from a surgical excision of the capital fragment with correction of the underlying hammertoe contracture.  I explained to the patient that this is probably the best benefit given that he is having continuous pain without any resolve meant.  Patient agrees with the plan would like to proceed with surgical treatment option as he has failed all conservative therapy including taping offloading with surgical shoe, shoe gear modification, injections.  I plan on performing fifth digit hammertoe derotational arthroplasty with excision of the fracture fragment as well as the arthroplasty of the head of the proximal phalanx.  Patient agrees with the plan would like to proceed with the surgery.  I discussed postop protocol in extensive detail which includes ambulating as tolerated with a surgical shoe.  Surgical shoe was dispensed. -Informed surgical risk consent was reviewed and read aloud to the patient.  I reviewed the films.  I have discussed my findings with the patient in great detail.  I have discussed all risks including but not limited to infection, stiffness, scarring, limp, disability, deformity, damage to blood vessels and nerves, numbness, poor healing, need for braces, arthritis, chronic pain, amputation, death.  All benefits and realistic expectations discussed in great detail.  I have made no promises as to the outcome.  I have provided realistic expectations.  I have offered the patient a 2nd opinion, which they have declined and assured me they preferred to proceed despite the risks -A total of 33 minutes was spent  in direct patient care as well as pre and post patient encounter activities.  This includes documentation as well as reviewing patient chart for labs, imaging, past medical, surgical, social, and family history as documented in the EMR.  I have reviewed medication allergies as documented in EMR.  I discussed the etiology of condition and treatment options from conservative to surgical care.  All risks and benefit of the treatment course was discussed in detail.  All questions were answered and return appointment was discussed.  Since the visit completed in an ambulatory/outpatient setting, the patient and/or parent/guardian has been advised to contact the providers office for worsening condition and seek medical treatment and/or call 911 if the patient deems either is necessary.    No follow-ups on file.

## 2020-09-13 ENCOUNTER — Telehealth: Payer: Self-pay

## 2020-09-13 ENCOUNTER — Encounter: Payer: Medicare Other | Admitting: Podiatry

## 2020-09-13 NOTE — Telephone Encounter (Signed)
DOS 09/26/2020  HAMMERTOE REPAIR 5TH RT - 28285  [employer] EFFECTIVE DATE - 12/18/2019  PLAN DEDUCTIBLE - $0.00 OUT OF POCKET - $3900.00 W/ $2831.51 REMAINING  CO-INSURANCE 0% / Day OUTPATIENT SURGERY 0% / Day OUTPATIENT HOSPITAL COPAY $295 / Day OUTPATIENT SURGERY $295 / Day OUTPATIENT HOSPITAL  Notification or Prior Authorization is not required for the requested services  This [employer]vant[employer] members plan does not currently require a prior authorization for these services. If you have general questions about the prior authorization requirements, please call us at [phone] or visit [email or web address] > Clinician Resources > Advance and Admission Notification Requirements. The number above acknowledges your notification. Please write this number down for future reference. Notification is not a guarantee of coverage or payment.  Decision ID #:[id number]

## 2020-09-22 ENCOUNTER — Encounter: Payer: Medicare Other | Admitting: Podiatry

## 2020-09-26 DIAGNOSIS — M2041 Other hammer toe(s) (acquired), right foot: Secondary | ICD-10-CM

## 2020-09-26 MED ORDER — OXYCODONE-ACETAMINOPHEN 10-325 MG PO TABS: 1.0000 | ORAL_TABLET | ORAL | 0 refills | Status: DC | PRN

## 2020-09-26 MED ORDER — IBUPROFEN 800 MG PO TABS: 800.0000 mg | ORAL_TABLET | Freq: Four times a day (QID) | ORAL | 1 refills | Status: DC | PRN

## 2020-09-26 NOTE — Addendum Note (Signed)
Addended by: Nicholes Rough on: 09/26/2020 07:48 AM   Modules accepted: Orders

## 2020-09-29 ENCOUNTER — Ambulatory Visit (INDEPENDENT_AMBULATORY_CARE_PROVIDER_SITE_OTHER): Payer: Medicare Other | Admitting: Podiatry

## 2020-09-29 ENCOUNTER — Encounter: Payer: Self-pay | Admitting: Podiatry

## 2020-09-29 ENCOUNTER — Other Ambulatory Visit: Payer: Self-pay

## 2020-09-29 DIAGNOSIS — S99921A Unspecified injury of right foot, initial encounter: Secondary | ICD-10-CM

## 2020-09-29 DIAGNOSIS — M2041 Other hammer toe(s) (acquired), right foot: Secondary | ICD-10-CM

## 2020-09-29 DIAGNOSIS — Z9889 Other specified postprocedural states: Secondary | ICD-10-CM

## 2020-09-29 NOTE — Progress Notes (Signed)
Subjective:  Patient ID: Albert Royse., male    DOB: September 22, 1946,  MRN: [id number]  Chief Complaint  Patient presents with  . Post-op Problem    "I had my boot off yesterday and hit my toe that I had surgery on"   74 y.o. male returns for post-op check.  Patient states he is doing fine.  He has mild pain to the area.  He states that there was some bleeding but no active bleeding noted.  The bandages have been clean dry and intact.  He is ambulating somewhat in a surgical shoe.  He has dementia so is not able to comprehend the directions adequately per wife.  He denies any other acute complaints.  Review of Systems: Negative except as noted in the HPI. Denies N/V/F/Ch.  Past Medical History:  Diagnosis Date  . Anxiety    takes Xanax daily as needed  . Arthritis    knees  . Back pain   . Chronic back pain   . Diverticulosis   . GERD (gastroesophageal reflux disease)    OTC meds as needed  . History of colon polyps    benign   . History of kidney stones   . History of migraine    takes Imitrex daily as needed.Last one 2 wks ago  . Hyperlipidemia    hx of-has been off of meds for about a yr  . Insomnia    only recent d/t pain  . Joint pain     Current Outpatient Medications:  .  acetaminophen (TYLENOL) 325 MG tablet, Take by mouth., Disp: , Rfl:  .  ALPRAZolam (XANAX) 0.25 MG tablet, TAKE 1 TABLET BY MOUTH EVERY DAY AS NEEDED FOR SLEEP, Disp: , Rfl:  .  amLODipine (NORVASC) 10 MG tablet, TAKE 1 TABLET BY MOUTH EVERY DAY, Disp: , Rfl:  .  aspirin EC 81 MG tablet, Take by mouth., Disp: , Rfl:  .  clopidogrel (PLAVIX) 75 MG tablet, , Disp: , Rfl:  .  DULoxetine (CYMBALTA) 60 MG capsule, Take by mouth., Disp: , Rfl:  .  ibuprofen (ADVIL) 800 MG tablet, Take 1 tablet (800 mg total) by mouth every 6 (six) hours as needed., Disp: 60 tablet, Rfl: 1 .  ketotifen (ALAWAY) 0.025 % ophthalmic solution, Place 1 drop into both eyes daily., Disp: , Rfl:  .  lidocaine (ASPERCREME  W/LIDOCAINE) 4 % cream, Apply 1 application topically 2 (two) times daily as needed (pain)., Disp: , Rfl:  .  Multiple Vitamin (MULTIVITAMIN WITH MINERALS) TABS tablet, Take 1 tablet by mouth daily., Disp: , Rfl:  .  oxyCODONE-acetaminophen (PERCOCET) 10-325 MG tablet, Take 1 tablet by mouth every 4 (four) hours as needed for pain., Disp: 30 tablet, Rfl: 0 .  rosuvastatin (CRESTOR) 20 MG tablet, , Disp: , Rfl:  .  SUMAtriptan (IMITREX) 50 MG tablet, Take by mouth., Disp: , Rfl:  .  trolamine salicylate (ASPERCREME) 10 % cream, Apply 1 application topically as needed for muscle pain., Disp: , Rfl:  .  Vitamins/Minerals TABS, Take by mouth., Disp: , Rfl:   Social History   Tobacco Use  Smoking Status Current Every Day Smoker  . Packs/day: 0.25  . Years: 50.00  . Pack years: 12.50  . Types: Cigarettes  Smokeless Tobacco Never Used  Tobacco Comment   3 cigs per day    Allergies  Allergen Reactions  . Codeine Nausea And Vomiting and Other (See Comments)   Objective:  There were no vitals filed for this  visit. There is no height or weight on file to calculate BMI. Constitutional Well developed. Well nourished.  Vascular Foot warm and well perfused. Capillary refill normal to all digits.   Neurologic Normal speech. Oriented to person, place, and time. Epicritic sensation to light touch grossly present bilaterally.  Dermatologic Skin healing well without signs of infection. Skin edges well coapted without signs of infection.  Orthopedic: Tenderness to palpation noted about the surgical site.   Radiographs: None Assessment:   1. Acquired hammertoe of right foot   2. Toe injury, right, initial encounter   3. Status post foot surgery    Plan:  Patient was evaluated and treated and all questions answered.  S/p foot surgery right -Progressing as expected post-operatively. -XR: None -WB Status: Weightbearing as tolerated in surgical shoe -Sutures: Intact.  No signs of  dehiscence.  No clinical signs of infection noted -Medications: None -Foot redressed.  No follow-ups on file.

## 2020-10-04 ENCOUNTER — Encounter: Payer: Medicare Other | Admitting: Podiatry

## 2020-10-06 ENCOUNTER — Encounter: Payer: Medicare Other | Admitting: Podiatry

## 2020-10-11 ENCOUNTER — Other Ambulatory Visit: Payer: Self-pay

## 2020-10-11 ENCOUNTER — Ambulatory Visit: Payer: Medicare Other

## 2020-10-11 ENCOUNTER — Encounter: Payer: Self-pay | Admitting: Podiatry

## 2020-10-11 ENCOUNTER — Ambulatory Visit (INDEPENDENT_AMBULATORY_CARE_PROVIDER_SITE_OTHER): Payer: Medicare Other | Admitting: Podiatry

## 2020-10-11 DIAGNOSIS — Z9889 Other specified postprocedural states: Secondary | ICD-10-CM

## 2020-10-11 DIAGNOSIS — M2041 Other hammer toe(s) (acquired), right foot: Secondary | ICD-10-CM

## 2020-10-11 NOTE — Addendum Note (Signed)
Addended by: Geraldine Contras D on: 10/11/2020 05:08 PM   Modules accepted: Orders

## 2020-10-11 NOTE — Progress Notes (Signed)
Subjective:  Patient ID: Albert [name], male    DOB: 10/27/1946,  MRN: [id number]  Chief Complaint  Patient presents with  . Routine Post Op     POV #2 DOS 09/26/20 HAMMERTOE REPAIR 5TH RT   74 y.o. male returns for post-op check.  Patient states he is doing fine.  He has some occasional burning.  Overall he has been ambulating with surgical shoe.  The bandages have been clean dry and intact.  He has not been able to follow/comprehend direction well because of dementia.  He denies any other acute complaints.  Review of Systems: Negative except as noted in the HPI. Denies N/V/F/Ch.  Past Medical History:  Diagnosis Date  . Anxiety    takes Xanax daily as needed  . Arthritis    knees  . Back pain   . Chronic back pain   . Diverticulosis   . GERD (gastroesophageal reflux disease)    OTC meds as needed  . History of colon polyps    benign   . History of kidney stones   . History of migraine    takes Imitrex daily as needed.Last one 2 wks ago  . Hyperlipidemia    hx of-has been off of meds for about a yr  . Insomnia    only recent d/t pain  . Joint pain     Current Outpatient Medications:  .  acetaminophen (TYLENOL) 325 MG tablet, Take by mouth., Disp: , Rfl:  .  ALPRAZolam (XANAX) 0.25 MG tablet, TAKE 1 TABLET BY MOUTH EVERY DAY AS NEEDED FOR SLEEP, Disp: , Rfl:  .  amLODipine (NORVASC) 10 MG tablet, TAKE 1 TABLET BY MOUTH EVERY DAY, Disp: , Rfl:  .  aspirin EC 81 MG tablet, Take by mouth., Disp: , Rfl:  .  clopidogrel (PLAVIX) 75 MG tablet, , Disp: , Rfl:  .  DULoxetine (CYMBALTA) 60 MG capsule, Take by mouth., Disp: , Rfl:  .  ibuprofen (ADVIL) 800 MG tablet, Take 1 tablet (800 mg total) by mouth every 6 (six) hours as needed., Disp: 60 tablet, Rfl: 1 .  ketotifen (ALAWAY) 0.025 % ophthalmic solution, Place 1 drop into both eyes daily., Disp: , Rfl:  .  lidocaine (ASPERCREME W/LIDOCAINE) 4 % cream, Apply 1 application topically 2 (two) times daily as needed (pain).,  Disp: , Rfl:  .  Multiple Vitamin (MULTIVITAMIN WITH MINERALS) TABS tablet, Take 1 tablet by mouth daily., Disp: , Rfl:  .  oxyCODONE-acetaminophen (PERCOCET) 10-325 MG tablet, Take 1 tablet by mouth every 4 (four) hours as needed for pain., Disp: 30 tablet, Rfl: 0 .  rosuvastatin (CRESTOR) 20 MG tablet, , Disp: , Rfl:  .  SUMAtriptan (IMITREX) 50 MG tablet, Take by mouth., Disp: , Rfl:  .  trolamine salicylate (ASPERCREME) 10 % cream, Apply 1 application topically as needed for muscle pain., Disp: , Rfl:  .  Vitamins/Minerals TABS, Take by mouth., Disp: , Rfl:   Social History   Tobacco Use  Smoking Status Current Every Day Smoker  . Packs/day: 0.25  . Years: 50.00  . Pack years: 12.50  . Types: Cigarettes  Smokeless Tobacco Never Used  Tobacco Comment   3 cigs per day    Allergies  Allergen Reactions  . Codeine Nausea And Vomiting and Other (See Comments)   Objective:  There were no vitals filed for this visit. There is no height or weight on file to calculate BMI. Constitutional Well developed. Well nourished.  Vascular Foot warm and  well perfused. Capillary refill normal to all digits.   Neurologic Normal speech. Oriented to person, place, and time. Epicritic sensation to light touch grossly present bilaterally.  Dermatologic  skin completely epithelialized.  No clinical signs of infection noted.  Orthopedic: Tenderness to palpation noted about the surgical site.   Radiographs: None Assessment:   1. Acquired hammertoe of right foot   2. Status post foot surgery    Plan:  Patient was evaluated and treated and all questions answered.  S/p foot surgery right -Progressing as expected post-operatively. -XR: None -WB Status: Transition to regular sneakers -Sutures: Stitches were removed.  No signs of dehiscence noted.  No signs of clinical infection noted. -Medications: None -  No follow-ups on file.

## 2020-10-25 ENCOUNTER — Other Ambulatory Visit: Payer: Self-pay | Admitting: Podiatry

## 2020-10-25 ENCOUNTER — Encounter: Payer: Medicare Other | Admitting: Podiatry

## 2020-10-26 NOTE — Telephone Encounter (Signed)
Please advise 

## 2020-11-08 ENCOUNTER — Encounter: Payer: Self-pay | Admitting: Podiatry

## 2020-11-08 ENCOUNTER — Other Ambulatory Visit: Payer: Self-pay

## 2020-11-08 ENCOUNTER — Ambulatory Visit (INDEPENDENT_AMBULATORY_CARE_PROVIDER_SITE_OTHER): Payer: Medicare Other

## 2020-11-08 ENCOUNTER — Ambulatory Visit (INDEPENDENT_AMBULATORY_CARE_PROVIDER_SITE_OTHER): Payer: Medicare Other | Admitting: Podiatry

## 2020-11-08 DIAGNOSIS — Z9889 Other specified postprocedural states: Secondary | ICD-10-CM

## 2020-11-08 DIAGNOSIS — M2041 Other hammer toe(s) (acquired), right foot: Secondary | ICD-10-CM

## 2020-11-08 NOTE — Progress Notes (Signed)
Subjective:  Patient ID: Albert Gayton., male    DOB: 1946-09-29,  MRN: [id number]  Chief Complaint  Patient presents with  . Routine Post Op     POV #3 DOS 09/26/20 HAMMERTOE REPAIR 5TH RT  "its doing good, its still a little tender at the incision"   74 y.o. male returns for post-op check.  Patient states he is doing fine.  He has some occasional burning.  He is ambulating with regular shoes.  He has not been able to follow/comprehend direction well because of dementia.  He denies any other acute complaints.  Review of Systems: Negative except as noted in the HPI. Denies N/V/F/Ch.  Past Medical History:  Diagnosis Date  . Anxiety    takes Xanax daily as needed  . Arthritis    knees  . Back pain   . Chronic back pain   . Diverticulosis   . GERD (gastroesophageal reflux disease)    OTC meds as needed  . History of colon polyps    benign   . History of kidney stones   . History of migraine    takes Imitrex daily as needed.Last one 2 wks ago  . Hyperlipidemia    hx of-has been off of meds for about a yr  . Insomnia    only recent d/t pain  . Joint pain     Current Outpatient Medications:  .  acetaminophen (TYLENOL) 325 MG tablet, Take by mouth., Disp: , Rfl:  .  ALPRAZolam (XANAX) 0.25 MG tablet, TAKE 1 TABLET BY MOUTH EVERY DAY AS NEEDED FOR SLEEP, Disp: , Rfl:  .  amLODipine (NORVASC) 10 MG tablet, TAKE 1 TABLET BY MOUTH EVERY DAY, Disp: , Rfl:  .  aspirin EC 81 MG tablet, Take by mouth., Disp: , Rfl:  .  clopidogrel (PLAVIX) 75 MG tablet, , Disp: , Rfl:  .  DULoxetine (CYMBALTA) 60 MG capsule, Take by mouth., Disp: , Rfl:  .  ibuprofen (ADVIL) 800 MG tablet, TAKE 1 TABLET(800 MG) BY MOUTH EVERY 6 HOURS AS NEEDED, Disp: 60 tablet, Rfl: 1 .  ketotifen (ALAWAY) 0.025 % ophthalmic solution, Place 1 drop into both eyes daily., Disp: , Rfl:  .  lidocaine (ASPERCREME W/LIDOCAINE) 4 % cream, Apply 1 application topically 2 (two) times daily as needed (pain)., Disp: , Rfl:    .  Multiple Vitamin (MULTIVITAMIN WITH MINERALS) TABS tablet, Take 1 tablet by mouth daily., Disp: , Rfl:  .  oxyCODONE-acetaminophen (PERCOCET) 10-325 MG tablet, Take 1 tablet by mouth every 4 (four) hours as needed for pain., Disp: 30 tablet, Rfl: 0 .  rosuvastatin (CRESTOR) 20 MG tablet, , Disp: , Rfl:  .  SUMAtriptan (IMITREX) 50 MG tablet, Take by mouth., Disp: , Rfl:  .  trolamine salicylate (ASPERCREME) 10 % cream, Apply 1 application topically as needed for muscle pain., Disp: , Rfl:  .  Vitamins/Minerals TABS, Take by mouth., Disp: , Rfl:   Social History   Tobacco Use  Smoking Status Current Every Day Smoker  . Packs/day: 0.25  . Years: 50.00  . Pack years: 12.50  . Types: Cigarettes  Smokeless Tobacco Never Used  Tobacco Comment   3 cigs per day    Allergies  Allergen Reactions  . Codeine Nausea And Vomiting and Other (See Comments)   Objective:  There were no vitals filed for this visit. There is no height or weight on file to calculate BMI. Constitutional Well developed. Well nourished.  Vascular Foot warm and well  perfused. Capillary refill normal to all digits.   Neurologic Normal speech. Oriented to person, place, and time. Epicritic sensation to light touch grossly present bilaterally.  Dermatologic  skin completely epithelialized.  No clinical signs of infection noted.  Orthopedic:  NO Tenderness to palpation noted about the surgical site.   Radiographs: None Assessment:   1. Acquired hammertoe of right foot   2. Status post foot surgery    Plan:  Patient was evaluated and treated and all questions answered.  S/p foot surgery right -Progressing as expected post-operatively. -XR: None -WB Status: Transition to regular sneakers -Sutures: None -Medications: None -Patient is clinically healed. Good correction alignment noted.  Patient is officially discharged from my care if any foot and ankle issues arise in the future come back and see me.   Patient states understanding  No follow-ups on file.

## 2021-01-28 ENCOUNTER — Emergency Department: Payer: Medicare Other

## 2021-01-28 ENCOUNTER — Encounter: Payer: Self-pay | Admitting: Emergency Medicine

## 2021-01-28 ENCOUNTER — Emergency Department
Admission: EM | Admit: 2021-01-28 | Discharge: 2021-01-29 | Disposition: A | Discharge status: Home / Self Care | Payer: Medicare Other | Attending: Emergency Medicine | Admitting: Emergency Medicine

## 2021-01-28 ENCOUNTER — Other Ambulatory Visit: Payer: Self-pay

## 2021-01-28 DIAGNOSIS — F0390 Unspecified dementia without behavioral disturbance: Secondary | ICD-10-CM | POA: Diagnosis not present

## 2021-01-28 DIAGNOSIS — Z79899 Other long term (current) drug therapy: Secondary | ICD-10-CM | POA: Insufficient documentation

## 2021-01-28 DIAGNOSIS — R6884 Jaw pain: Secondary | ICD-10-CM | POA: Diagnosis present

## 2021-01-28 DIAGNOSIS — F17210 Nicotine dependence, cigarettes, uncomplicated: Secondary | ICD-10-CM | POA: Insufficient documentation

## 2021-01-28 DIAGNOSIS — Z7982 Long term (current) use of aspirin: Secondary | ICD-10-CM | POA: Insufficient documentation

## 2021-01-28 DIAGNOSIS — K1120 Sialoadenitis, unspecified: Secondary | ICD-10-CM

## 2021-01-28 LAB — COMPREHENSIVE METABOLIC PANEL
ALT: 28 U/L (ref 0–44)
AST: 23 U/L (ref 15–41)
Albumin: 4.2 g/dL (ref 3.5–5.0)
Alkaline Phosphatase: 55 U/L (ref 38–126)
Anion gap: 8 (ref 5–15)
BUN: 32 mg/dL — ABNORMAL HIGH (ref 8–23)
CO2: 27 mmol/L (ref 22–32)
Calcium: 9 mg/dL (ref 8.9–10.3)
Chloride: 105 mmol/L (ref 98–111)
Creatinine, Ser: 0.87 mg/dL (ref 0.61–1.24)
GFR, Estimated: >60 mL/min (ref >60)
Glucose, Bld: 105 mg/dL — ABNORMAL HIGH (ref 70–99)
Potassium: 3.9 mmol/L (ref 3.5–5.1)
Sodium: 140 mmol/L (ref 135–145)
Total Bilirubin: 0.6 mg/dL (ref 0.3–1.2)
Total Protein: 6.9 g/dL (ref 6.5–8.1)

## 2021-01-28 LAB — CBC WITH DIFFERENTIAL/PLATELET
Abs Immature Granulocytes: 0.02 10*3/uL (ref 0.00–0.07)
Basophils Absolute: 0.1 10*3/uL (ref 0.0–0.1)
Basophils Relative: 1 %
Eosinophils Absolute: 0.3 10*3/uL (ref 0.0–0.5)
Eosinophils Relative: 4 %
HCT: 35.9 % — ABNORMAL LOW (ref 39.0–52.0)
Hemoglobin: 11.9 g/dL — ABNORMAL LOW (ref 13.0–17.0)
Immature Granulocytes: 0 %
Lymphocytes Relative: 43 %
Lymphs Abs: 3.7 10*3/uL (ref 0.7–4.0)
MCH: 30.6 pg (ref 26.0–34.0)
MCHC: 33.1 g/dL (ref 30.0–36.0)
MCV: 92.3 fL (ref 80.0–100.0)
Monocytes Absolute: 0.6 10*3/uL (ref 0.1–1.0)
Monocytes Relative: 7 %
Neutro Abs: 4 10*3/uL (ref 1.7–7.7)
Neutrophils Relative %: 45 %
Platelets: 224 10*3/uL (ref 150–400)
RBC: 3.89 MIL/uL — ABNORMAL LOW (ref 4.22–5.81)
RDW: 14.5 % (ref 11.5–15.5)
WBC: 8.7 10*3/uL (ref 4.0–10.5)
nRBC: 0 % (ref 0.0–0.2)

## 2021-01-28 MED ORDER — IOHEXOL 300 MG/ML  SOLN
75.0000 mL | Freq: Once | INTRAMUSCULAR | Status: AC | PRN
  Administered 2021-01-28: 75 mL via INTRAVENOUS
  Filled 2021-01-28: qty 75

## 2021-01-28 NOTE — ED Notes (Signed)
Patient transported to CT 

## 2021-01-28 NOTE — ED Triage Notes (Signed)
Patient ambulatory to triage with complaints of lump on left jaw that appear at 1800 just as pt was starting to eat dinner, pt denies threats to swallow or breathing, pain is 7/10 sharp and pt reports worse with palpation and chewing  Pt with wife for ease of assessment d/t dementia hx

## 2021-01-29 MED ORDER — AMOXICILLIN-POT CLAVULANATE 875-125 MG PO TABS: 1.0000 | ORAL_TABLET | Freq: Two times a day (BID) | ORAL | 0 refills | Status: AC

## 2021-01-29 MED ORDER — AMOXICILLIN-POT CLAVULANATE 875-125 MG PO TABS
1.0000 | ORAL_TABLET | Freq: Once | ORAL | Status: AC
  Administered 2021-01-29: 1 via ORAL
  Filled 2021-01-29: qty 1

## 2021-01-29 NOTE — Discharge Instructions (Addendum)
Please take antibiotic as prescribed. Follow up with Dr. Jenne Campus. Return to ER with any worsening.

## 2021-01-29 NOTE — ED Provider Notes (Signed)
[facility] Emergency Department Provider Note  ____________________________________________   Event Date/Time   First MD Initiated Contact with Patient 01/28/21 2209     (approximate)  I have reviewed the triage vital signs and the nursing notes.   HISTORY  Chief Complaint Facial Swelling  HPI Albert [name] is a 75 y.o. male who reports to the emergency department for evaluation of left-sided jaw pain and swelling that the patient noticed when he was eating dinner tonight.  He reports significant pain and swelling while trying to chew.  Patient has a history of dementia and portion of his history is provided by his wife.  She reports that there was more swelling that is present now, however they applied ice to the area.  She reports the swelling was along the left shoulder, just under the jaw as well as towards the front of the left ear.  Patient denies any history of similar on this side.  They do report a similar history when patient had a dental abscess that got down into the mandible on the right side, this was several years ago.  Patient reports his pain is 7/10, has not been treated with any alleviating measures yet.  He specifically denies any difficulty swallowing, shortness of breath, pain under the tongue.         Past Medical History:  Diagnosis Date  . Anxiety    takes Xanax daily as needed  . Arthritis    knees  . Back pain   . Chronic back pain   . Diverticulosis   . GERD (gastroesophageal reflux disease)    OTC meds as needed  . History of colon polyps    benign   . History of kidney stones   . History of migraine    takes Imitrex daily as needed.Last one 2 wks ago  . Hyperlipidemia    hx of-has been off of meds for about a yr  . Insomnia    only recent d/t pain  . Joint pain     Patient Active Problem List   Diagnosis Date Noted  . Toe fracture 02/22/2020  . Displaced fracture of proximal phalanx of right lesser toe(s),  initial encounter for closed fracture 07/30/2019  . 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 ANKLE SURGERY Right 1997   ankles/screws   . ARTHROSCOPIC REPAIR ACL Right 1993  . BACK SURGERY  2017   titanium rods and pins in lower back  . COLONOSCOPY    . EPIDIDYMECTOMY Left 04/10/2017   Procedure: EPIDIDYMECTOMY;  Surgeon: [name], MD;  Location: [facility] ORS;  Service: Urology;  Laterality: Left;  . ESOPHAGOGASTRODUODENOSCOPY    . LUMBAR FUSION  March 13, 2016   L4/L5 fusion  . TONSILLECTOMY  1962   adenoidectomy   . VASECTOMY  1973    Prior to Admission medications   Medication Sig Start Date End Date Taking? Authorizing Provider  amoxicillin-clavulanate (AUGMENTIN) 875-125 MG tablet Take 1 tablet by mouth 2 (two) times daily for 7 days. 01/29/21 02/05/21 Yes Lucy Chris, PA  acetaminophen (TYLENOL) 325 MG tablet Take by mouth.    [provider]  ALPRAZolam (XANAX) 0.25 MG tablet TAKE 1 TABLET BY MOUTH EVERY DAY AS NEEDED FOR SLEEP 05/01/19   [provider]  amLODipine (NORVASC) 10 MG tablet TAKE 1 TABLET BY MOUTH EVERY DAY 08/27/18   [provider]  aspirin EC 81 MG tablet Take by  mouth.    [provider]  clopidogrel (PLAVIX) 75 MG tablet  07/29/19   [provider]  DULoxetine (CYMBALTA) 60 MG capsule Take by mouth. 02/25/19 02/25/20  [provider]  ibuprofen (ADVIL) 800 MG tablet TAKE 1 TABLET(800 MG) BY MOUTH EVERY 6 HOURS AS NEEDED 10/26/20   Candelaria StagersPatel, Kevin P, DPM  ketotifen (ALAWAY) 0.025 % ophthalmic solution Place 1 drop into both eyes daily.    [provider]  lidocaine (ASPERCREME W/LIDOCAINE) 4 % cream Apply 1 application topically 2 (two) times daily as needed (pain).    [provider]  Multiple Vitamin (MULTIVITAMIN WITH MINERALS) TABS tablet Take 1 tablet by mouth daily.    [provider]  oxyCODONE-acetaminophen  (PERCOCET) 10-325 MG tablet Take 1 tablet by mouth every 4 (four) hours as needed for pain. 09/26/20   Candelaria StagersPatel, Kevin P, DPM  rosuvastatin (CRESTOR) 20 MG tablet  07/29/19   [provider]  SUMAtriptan (IMITREX) 50 MG tablet Take by mouth.    [provider]  trolamine salicylate (ASPERCREME) 10 % cream Apply 1 application topically as needed for muscle pain.    [provider]  Vitamins/Minerals TABS Take by mouth.    [provider]    Allergies Codeine  Family History  Problem Relation Age of Onset  . Prostate cancer Neg Hx   . Bladder Cancer Neg Hx   . Kidney cancer Neg Hx     Social History Social History   Tobacco Use  . Smoking status: Current Every Day Smoker    Packs/day: 0.50    Years: 50.00    Pack years: 25.00    Types: Cigarettes  . Smokeless tobacco: Never Used  . Tobacco comment: 3 cigs per day  Substance Use Topics  . Alcohol use: No  . Drug use: No    Review of Systems Constitutional: No fever/chills Eyes: No visual changes. ENT: + Left-sided jaw pain, left sided facial swelling, no sore throat. Cardiovascular: Denies chest pain. Respiratory: Denies shortness of breath. Gastrointestinal: No abdominal pain.  No nausea, no vomiting.  No diarrhea.  No constipation. Genitourinary: Negative for dysuria. Musculoskeletal: Negative for back pain. Skin: Negative for rash. Neurological: Negative for headaches, focal weakness or numbness.   ____________________________________________   PHYSICAL EXAM:  VITAL SIGNS: ED Triage Vitals  Enc Vitals Group     BP 01/28/21 1914 (!) 146/90     Pulse Rate 01/28/21 1914 72     Resp 01/28/21 1914 16     Temp 01/28/21 1914 97.6 F (36.4 C)     Temp Source 01/28/21 1914 Oral     SpO2 01/28/21 1914 96 %     Weight 01/28/21 1915 160 lb (72.6 kg)     Height 01/28/21 1915 6\' 2"  (1.88 m)     Head Circumference --      Peak Flow --      Pain Score 01/28/21 1915 7     Pain Loc --       Pain Edu? --      Excl. in GC? --    Constitutional: Alert and oriented. Well appearing and in no acute distress. Eyes: Conjunctivae are normal. PERRL. EOMI. Head: Atraumatic. Nose: No congestion/rhinnorhea. Mouth/Throat: There is a soft tissue swelling noted to the left mandible that feels firm and mobile.  There is no pain inside the mouth, no obvious dental abscess.  There is no tenderness to palpation under the central mandible, no pain under the tongue.  Airway remains patent. Ears: The external ear appears normal, no tenderness to the mastoids bilaterally.  There is difficulty visualizing the TMs bilaterally due to cerumen. Neck: No stridor.   Cardiovascular: Normal rate, regular rhythm. Grossly normal heart sounds.  Good peripheral circulation. Respiratory: Normal respiratory effort.  No retractions. Lungs CTAB. Gastrointestinal: Soft and nontender. No distention. No abdominal bruits. No CVA tenderness. Musculoskeletal: No lower extremity tenderness nor edema.  No joint effusions. Neurologic: Patient does have mild difficulty finding some words in the setting of history of dementia.  No gross focal neurologic deficits are appreciated.  Skin:  Skin is warm, dry and intact. No rash noted. Psychiatric: Mood and affect are normal. Speech and behavior are normal.  ____________________________________________   LABS (all labs ordered are listed, but only abnormal results are displayed)  Labs Reviewed  COMPREHENSIVE METABOLIC PANEL - Abnormal; Notable for the following components:      Result Value   Glucose, Bld 105 (*)    BUN 32 (*)    All other components within normal limits  CBC WITH DIFFERENTIAL/PLATELET - Abnormal; Notable for the following components:   RBC 3.89 (*)    Hemoglobin 11.9 (*)    HCT 35.9 (*)    All other components within normal limits   ____________________________________________  RADIOLOGY  Official radiology report(s): CT Maxillofacial W  Contrast  Result Date: 01/28/2021 CLINICAL DATA:  Lump on the left side of the jaw. Maxillary or facial abscess. EXAM: CT MAXILLOFACIAL WITH CONTRAST TECHNIQUE: Multidetector CT imaging of the maxillofacial structures was performed with intravenous contrast. Multiplanar CT image reconstructions were also generated. CONTRAST:  29mL OMNIPAQUE IOHEXOL 300 MG/ML  SOLN COMPARISON:  None. FINDINGS: Osseous: Normal Orbits: Normal Sinuses: Sinuses are clear except for some fluid or mucoid material dependent in the sphenoid sinus. Mastoid effusion noted on the right. Soft tissues: Right facial soft tissues are normal including a normal appearance of the right parotid gland and submandibular gland. On the left, there is marked enlargement of the parotid gland with some adjacent edema. No evidence of parotid stone or mass. No stone seen in duct. Findings most consistent with acute sialoadenitis. Limited intracranial: Normal other than some atherosclerotic calcification of the major vessels at the base of the brain. IMPRESSION: 1. Acute sialoadenitis of the left parotid gland. No evidence of stone or mass. No stone seen in the duct. 2. Mastoid effusion on the right. 3. Some fluid or mucoid material dependent in the sphenoid sinus. Electronically Signed   By: Paulina Fusi M.D.   On: 01/28/2021 23:48    ____________________________________________   INITIAL IMPRESSION / ASSESSMENT AND PLAN / ED COURSE  As part of my medical decision making, I reviewed the following data within the electronic MEDICAL RECORD NUMBER History obtained from family, Nursing notes reviewed and incorporated, Labs reviewed and Notes from prior ED visits        Patient is a 75 year old male who presents to the emergency department for evaluation of left-sided facial pain and swelling that began while eating dinner tonight.  He denies any pain under the tongue, denies any difficulty swallowing or difficulty breathing, denies systemic symptoms of  fever, chills etc.  A portion of his history is provided by his wife in the setting of history of dementia.  See HPI for further details.  In triage, the patient is mildly hypertensive but otherwise has normal vital signs.  On physical exam, there is some noted swelling as well as a small, mobile area on the  left mandible.  There is no surrounding erythema.  Patient does not have any lymphadenopathy or tenderness of the tongue.  Given his age, risk factors and physical exam, laboratory evaluation and CT with contrast of maxillofacial was performed.  Labs are grossly reassuring, CT reveals acute sialoadenitis of the left parotid gland with no evidence of stone.  There is also noted mastoid effusion on the right.  Patient will be placed on a course of Augmentin, and will have the patient follow-up with ENT.  He is to return for any acute worsening, fever, chills or worsening swelling.  The patient and his wife are amenable with this plan he stable this time for outpatient therapy.      ____________________________________________   FINAL CLINICAL IMPRESSION(S) / ED DIAGNOSES  Final diagnoses:  Sialadenitis     ED Discharge Orders         Ordered    amoxicillin-clavulanate (AUGMENTIN) 875-125 MG tablet  2 times daily        01/29/21 0007          *Please note:  Albert [name] was evaluated in Emergency Department on 01/29/2021 for the symptoms described in the history of present illness. He was evaluated in the context of the global COVID-19 pandemic, which necessitated consideration that the patient might be at risk for infection with the SARS-CoV-2 virus that causes COVID-19. Institutional protocols and algorithms that pertain to the evaluation of patients at risk for COVID-19 are in a state of rapid change based on information released by regulatory bodies including the CDC and federal and state organizations. These policies and algorithms were followed during the patient's care in the  ED.  Some ED evaluations and interventions may be delayed as a result of limited staffing during and the pandemic.*   Note:  This document was prepared using Dragon voice recognition software and may include unintentional dictation errors.   Lucy Chris, PA 01/29/21 1509    Concha Se, MD 01/29/21 [phone]

## 2021-10-26 ENCOUNTER — Ambulatory Visit: Payer: Medicare Other | Admitting: Podiatry

## 2021-10-31 ENCOUNTER — Other Ambulatory Visit: Payer: Self-pay

## 2021-10-31 ENCOUNTER — Ambulatory Visit: Payer: Medicare Other | Admitting: Podiatry

## 2021-10-31 DIAGNOSIS — M7751 Other enthesopathy of right foot: Secondary | ICD-10-CM

## 2021-10-31 NOTE — Progress Notes (Signed)
Subjective:  Patient ID: Albert [name], male    DOB: 10-27-1946,  MRN: [id number]  Chief Complaint  Patient presents with   Toe Pain    Left foot 5th toe pain     75 y.o. male presents with the above complaint.  Patient presents with complaint of right fifth toe pain.  Patient states there is some mild pain associated with it.  He states been going on for quite some time.  He was doing well after the surgery last year however the pain is started acting up.  He has been wearing shoes that are a little bit tighter.  He wanted to get this evaluated further.  He denies seeing anyone else prior to seeing me for this.  He denies any other acute complaints.   Review of Systems: Negative except as noted in the HPI. Denies N/V/F/Ch.  Past Medical History:  Diagnosis Date   Anxiety    takes Xanax daily as needed   Arthritis    knees   Back pain    Chronic back pain    Diverticulosis    GERD (gastroesophageal reflux disease)    OTC meds as needed   History of colon polyps    benign    History of kidney stones    History of migraine    takes Imitrex daily as needed.Last one 2 wks ago   Hyperlipidemia    hx of-has been off of meds for about a yr   Insomnia    only recent d/t pain   Joint pain     Current Outpatient Medications:    acetaminophen (TYLENOL) 325 MG tablet, Take by mouth., Disp: , Rfl:    ALPRAZolam (XANAX) 0.25 MG tablet, TAKE 1 TABLET BY MOUTH EVERY DAY AS NEEDED FOR SLEEP, Disp: , Rfl:    amLODipine (NORVASC) 10 MG tablet, TAKE 1 TABLET BY MOUTH EVERY DAY, Disp: , Rfl:    aspirin EC 81 MG tablet, Take by mouth., Disp: , Rfl:    clopidogrel (PLAVIX) 75 MG tablet, , Disp: , Rfl:    DULoxetine (CYMBALTA) 60 MG capsule, Take by mouth., Disp: , Rfl:    ibuprofen (ADVIL) 800 MG tablet, TAKE 1 TABLET(800 MG) BY MOUTH EVERY 6 HOURS AS NEEDED, Disp: 60 tablet, Rfl: 1   ketotifen (ALAWAY) 0.025 % ophthalmic solution, Place 1 drop into both eyes daily., Disp: , Rfl:     lidocaine (ASPERCREME W/LIDOCAINE) 4 % cream, Apply 1 application topically 2 (two) times daily as needed (pain)., Disp: , Rfl:    Multiple Vitamin (MULTIVITAMIN WITH MINERALS) TABS tablet, Take 1 tablet by mouth daily., Disp: , Rfl:    oxyCODONE-acetaminophen (PERCOCET) 10-325 MG tablet, Take 1 tablet by mouth every 4 (four) hours as needed for pain., Disp: 30 tablet, Rfl: 0   rosuvastatin (CRESTOR) 20 MG tablet, , Disp: , Rfl:    SUMAtriptan (IMITREX) 50 MG tablet, Take by mouth., Disp: , Rfl:    trolamine salicylate (ASPERCREME) 10 % cream, Apply 1 application topically as needed for muscle pain., Disp: , Rfl:    Vitamins/Minerals TABS, Take by mouth., Disp: , Rfl:   Social History   Tobacco Use  Smoking Status Every Day   Packs/day: 0.50   Years: 50.00   Pack years: 25.00   Types: Cigarettes  Smokeless Tobacco Never  Tobacco Comments   3 cigs per day    Allergies  Allergen Reactions   Codeine Nausea And Vomiting and Other (See Comments)   Objective:  There were no vitals filed for this visit. There is no height or weight on file to calculate BMI. Constitutional Well developed. Well nourished.  Vascular Dorsalis pedis pulses palpable bilaterally. Posterior tibial pulses palpable bilaterally. Capillary refill normal to all digits.  No cyanosis or clubbing noted. Pedal hair growth normal.  Neurologic Normal speech. Oriented to person, place, and time. Epicritic sensation to light touch grossly present bilaterally.  Dermatologic Nails well groomed and normal in appearance. No open wounds. No skin lesions.  Orthopedic: Pain on palpation right fifth digit.  Pain with range of motion of the IPJ joint.  Good correction alignment noted of the surgical fifth toe.   Radiographs: None Assessment:   1. Adhesive capsulitis of toe, right    Plan:  Patient was evaluated and treated and all questions answered.  Right fifth digit capsulitis -I explained to the patient the  etiology of capsulitis and various treatment options were extensively discussed.  Given the amount of pain that is having I believe patient will benefit from steroid injection of decrease acute inflammatory component associated pain.  Patient agrees with plan like to proceed with steroid injection. -A steroid injection was performed at right fifth IPJ using 1% plain Lidocaine and 10 mg of Kenalog. This was well tolerated. -   No follow-ups on file.

## 2021-12-21 ENCOUNTER — Other Ambulatory Visit: Payer: Self-pay

## 2021-12-21 ENCOUNTER — Encounter: Payer: Self-pay | Admitting: Podiatry

## 2021-12-21 ENCOUNTER — Ambulatory Visit: Payer: Medicare Other | Admitting: Podiatry

## 2021-12-21 DIAGNOSIS — G5791 Unspecified mononeuropathy of right lower limb: Secondary | ICD-10-CM

## 2021-12-21 MED ORDER — GABAPENTIN 100 MG PO CAPS: 100.0000 mg | ORAL_CAPSULE | Freq: Three times a day (TID) | ORAL | 3 refills | Status: DC

## 2021-12-21 MED ORDER — LIDOCAINE 5 % EX OINT: 1.0000 "application " | TOPICAL_OINTMENT | CUTANEOUS | 0 refills | Status: DC | PRN

## 2021-12-21 NOTE — Progress Notes (Signed)
Subjective:  Patient ID: Albert Brigham., male    DOB: 1946/09/14,  MRN: [id number]  Chief Complaint  Patient presents with   Toe Pain    "It's not too good.  It's burning."    76 y.o. male presents with the above complaint.  Patient presents with complaint of right fifth toe neuritis.  He states there is a lot of burning is painful started in the last 2 weeks.  The injection in the past has helped.  He wanted to get it evaluated make sure that there is nothing else going on.   Review of Systems: Negative except as noted in the HPI. Denies N/V/F/Ch.  Past Medical History:  Diagnosis Date   Anxiety    takes Xanax daily as needed   Arthritis    knees   Back pain    Chronic back pain    Diverticulosis    GERD (gastroesophageal reflux disease)    OTC meds as needed   History of colon polyps    benign    History of kidney stones    History of migraine    takes Imitrex daily as needed.Last one 2 wks ago   Hyperlipidemia    hx of-has been off of meds for about a yr   Insomnia    only recent d/t pain   Joint pain     Current Outpatient Medications:    gabapentin (NEURONTIN) 100 MG capsule, Take 1 capsule (100 mg total) by mouth 3 (three) times daily., Disp: 90 capsule, Rfl: 3   lidocaine (XYLOCAINE) 5 % ointment, Apply 1 application topically as needed., Disp: 35.44 g, Rfl: 0   acetaminophen (TYLENOL) 325 MG tablet, Take by mouth., Disp: , Rfl:    ALPRAZolam (XANAX) 0.25 MG tablet, TAKE 1 TABLET BY MOUTH EVERY DAY AS NEEDED FOR SLEEP, Disp: , Rfl:    amLODipine (NORVASC) 10 MG tablet, TAKE 1 TABLET BY MOUTH EVERY DAY, Disp: , Rfl:    aspirin EC 81 MG tablet, Take by mouth., Disp: , Rfl:    clopidogrel (PLAVIX) 75 MG tablet, , Disp: , Rfl:    donepezil (ARICEPT) 5 MG tablet, Take 5 mg by mouth daily., Disp: , Rfl:    DULoxetine (CYMBALTA) 60 MG capsule, Take by mouth., Disp: , Rfl:    ibuprofen (ADVIL) 800 MG tablet, TAKE 1 TABLET(800 MG) BY MOUTH EVERY 6 HOURS AS NEEDED,  Disp: 60 tablet, Rfl: 1   ketotifen (ALAWAY) 0.025 % ophthalmic solution, Place 1 drop into both eyes daily., Disp: , Rfl:    lidocaine (ASPERCREME W/LIDOCAINE) 4 % cream, Apply 1 application topically 2 (two) times daily as needed (pain)., Disp: , Rfl:    Multiple Vitamin (MULTIVITAMIN WITH MINERALS) TABS tablet, Take 1 tablet by mouth daily., Disp: , Rfl:    oxyCODONE-acetaminophen (PERCOCET) 10-325 MG tablet, Take 1 tablet by mouth every 4 (four) hours as needed for pain., Disp: 30 tablet, Rfl: 0   rosuvastatin (CRESTOR) 20 MG tablet, , Disp: , Rfl:    SUMAtriptan (IMITREX) 50 MG tablet, Take by mouth., Disp: , Rfl:    trolamine salicylate (ASPERCREME) 10 % cream, Apply 1 application topically as needed for muscle pain., Disp: , Rfl:    Vitamins/Minerals TABS, Take by mouth., Disp: , Rfl:   Social History   Tobacco Use  Smoking Status Every Day   Packs/day: 0.50   Years: 50.00   Pack years: 25.00   Types: Cigarettes  Smokeless Tobacco Never  Tobacco Comments   Half  a pack a day, per wife.    Allergies  Allergen Reactions   Codeine Nausea And Vomiting and Other (See Comments)   Objective:  There were no vitals filed for this visit. There is no height or weight on file to calculate BMI. Constitutional Well developed. Well nourished.  Vascular Dorsalis pedis pulses palpable bilaterally. Posterior tibial pulses palpable bilaterally. Capillary refill normal to all digits.  No cyanosis or clubbing noted. Pedal hair growth normal.  Neurologic Normal speech. Oriented to person, place, and time. Epicritic sensation to light touch grossly present bilaterally.  Dermatologic Nails well groomed and normal in appearance. No open wounds. No skin lesions.  Orthopedic: Mild pain on palpation right fifth digit.  Mild pain with range of motion of the IPJ joint.  Good correction alignment noted of the surgical fifth toe.  Neuritis type of symptoms noted.   Radiographs: None Assessment:    1. Neuritis of foot, right     Plan:  Patient was evaluated and treated and all questions answered.  Right fifth digit numbness tingling/neuritis -I explained to the patient the etiology of capsulitis and various treatment options were extensively discussed.  Given the amount of pain that is having I believe would benefit from gabapentin low-dose 100 mg.  I have asked him to take and start by taking once a day but he can increase it up to 3 times a day.   -Lidocaine jelly was sent to the pharmacy and apply as needed -Gabapentin was sent to the pharmacy.  No follow-ups on file.

## 2022-04-03 ENCOUNTER — Inpatient Hospital Stay: Admission: RE | Admit: 2022-04-03 | Payer: Medicare Other | Source: Ambulatory Visit

## 2022-04-05 ENCOUNTER — Other Ambulatory Visit: Payer: Self-pay | Admitting: Podiatry

## 2022-04-10 ENCOUNTER — Encounter
Admission: RE | Admit: 2022-04-10 | Discharge: 2022-04-10 | Disposition: A | Discharge status: Home / Self Care | Payer: Medicare Other | Source: Ambulatory Visit | Attending: Podiatry | Admitting: Podiatry

## 2022-04-10 ENCOUNTER — Other Ambulatory Visit
Admission: RE | Admit: 2022-04-10 | Discharge: 2022-04-10 | Disposition: A | Discharge status: Home / Self Care | Payer: Medicare Other | Source: Ambulatory Visit | Attending: Podiatry | Admitting: Podiatry

## 2022-04-10 ENCOUNTER — Other Ambulatory Visit: Payer: Self-pay

## 2022-04-10 VITALS — Ht 74.0 in | Wt 171.0 lb

## 2022-04-10 DIAGNOSIS — Z7901 Long term (current) use of anticoagulants: Secondary | ICD-10-CM | POA: Insufficient documentation

## 2022-04-10 DIAGNOSIS — I2510 Atherosclerotic heart disease of native coronary artery without angina pectoris: Secondary | ICD-10-CM

## 2022-04-10 DIAGNOSIS — Z01818 Encounter for other preprocedural examination: Secondary | ICD-10-CM | POA: Diagnosis present

## 2022-04-10 HISTORY — DX: Calculus of kidney: N20.0

## 2022-04-10 HISTORY — DX: Unspecified dementia, unspecified severity, without behavioral disturbance, psychotic disturbance, mood disturbance, and anxiety: F03.90

## 2022-04-10 HISTORY — DX: Essential (primary) hypertension: I10

## 2022-04-10 HISTORY — DX: Headache, unspecified: R51.9

## 2022-04-10 HISTORY — DX: Cerebral infarction, unspecified: I63.9

## 2022-04-10 HISTORY — DX: Displaced fracture of proximal phalanx of right lesser toe(s), initial encounter for closed fracture: S92.511A

## 2022-04-10 HISTORY — DX: Prediabetes: R73.03

## 2022-04-10 HISTORY — DX: Barrett's esophagus without dysplasia: K22.70

## 2022-04-10 LAB — BASIC METABOLIC PANEL
Anion gap: 8 (ref 5–15)
BUN: 21 mg/dL (ref 8–23)
CO2: 28 mmol/L (ref 22–32)
Calcium: 9.2 mg/dL (ref 8.9–10.3)
Chloride: 104 mmol/L (ref 98–111)
Creatinine, Ser: 1.01 mg/dL (ref 0.61–1.24)
GFR, Estimated: >60 mL/min (ref >60)
Glucose, Bld: 97 mg/dL (ref 70–99)
Potassium: 3.5 mmol/L (ref 3.5–5.1)
Sodium: 140 mmol/L (ref 135–145)

## 2022-04-10 LAB — CBC
HCT: 40 % (ref 39.0–52.0)
Hemoglobin: 13.1 g/dL (ref 13.0–17.0)
MCH: 30.1 pg (ref 26.0–34.0)
MCHC: 32.8 g/dL (ref 30.0–36.0)
MCV: 92 fL (ref 80.0–100.0)
Platelets: 253 10*3/uL (ref 150–400)
RBC: 4.35 MIL/uL (ref 4.22–5.81)
RDW: 14.5 % (ref 11.5–15.5)
WBC: 8.4 10*3/uL (ref 4.0–10.5)
nRBC: 0 % (ref 0.0–0.2)

## 2022-04-10 NOTE — Patient Instructions (Addendum)
Your procedure is scheduled on: Friday 04/13/22 ?Report to the Registration Desk on the 1st floor of the [facility]. ?To find out your arrival time, please call [phone] between 1PM - 3PM on: Thursday 04/12/22 ? ?REMEMBER: ?Instructions that are not followed completely may result in serious medical risk, up to and including death; or upon the discretion of your surgeon and anesthesiologist your surgery may need to be rescheduled. ? ?Do not eat food after midnight the night before surgery.  ?No gum chewing, lozengers or hard candies. ? ?You may however, drink CLEAR liquids up to 2 hours before you are scheduled to arrive for your surgery. Do not drink anything within 2 hours of your scheduled arrival time. ? ?Clear liquids include: ?- water  ?- apple juice without pulp ?- gatorade (not RED colors) ?- black coffee or tea (Do NOT add milk or creamers to the coffee or tea) ?Do NOT drink anything that is not on this list. ? ?In addition, your doctor has ordered for you to drink the provided  ?Ensure Pre-Surgery Clear Carbohydrate Drink  ?Drinking this carbohydrate drink up to two hours before surgery helps to reduce insulin resistance and improve patient outcomes. Please complete drinking 2 hours prior to scheduled arrival time. ? ?TAKE THESE MEDICATIONS THE MORNING OF SURGERY WITH A SIP OF WATER: ?amLODipine (NORVASC) 10 MG tablet ?DULoxetine (CYMBALTA) 60 MG capsule ?gabapentin (NEURONTIN) 100 MG capsule ?rosuvastatin (CRESTOR) 20 MG tablet ? ?Continue holding your Aspirin and Plavix until after the surgery. ? ?One week prior to surgery: ?Stop Anti-inflammatories (NSAIDS) such as Advil, Aleve, Ibuprofen, Motrin, Naproxen, Naprosyn and Aspirin based products such as Excedrin, Goodys Powder, BC Powder. ? ?Stop taking your Multiple Vitamin (MULTIVITAMIN WITH MINERALS) TABS tablet, and ANY OVER THE COUNTER supplements until after surgery. ? ?You may however, continue to take Tylenol if needed for pain up until the  day of surgery. ? ?No Alcohol for 24 hours before or after surgery. ? ?No Smoking including e-cigarettes for 24 hours prior to surgery.  ?No chewable tobacco products for at least 6 hours prior to surgery.  ?No nicotine patches on the day of surgery. ? ?Do not use any "recreational" drugs for at least a week prior to your surgery.  ?Please be advised that the combination of cocaine and anesthesia may have negative outcomes, up to and including death. ?If you test positive for cocaine, your surgery will be cancelled. ? ?On the morning of surgery brush your teeth with toothpaste and water, you may rinse your mouth with mouthwash if you wish. ?Do not swallow any toothpaste or mouthwash. ? ?Use CHG Soap as directed on instruction sheet. ? ?Do not wear jewelry. ? ?Do not wear lotions, powders, or colognes.  ? ?Do not shave body from the neck down 48 hours prior to surgery just in case you cut yourself which could leave a site for infection.  ?Also, freshly shaved skin may become irritated if using the CHG soap. ? ?Do not bring valuables to the hospital. [facility] is not responsible for any missing/lost belongings or valuables.  ? ?Notify your doctor if there is any change in your medical condition (cold, fever, infection). ? ?Wear comfortable clothing (specific to your surgery type) to the hospital. ? ?After surgery, you can help prevent lung complications by doing breathing exercises.  ?Take deep breaths and cough every 1-2 hours. Your doctor may order a device called an Incentive Spirometer to help you take deep breaths. ? ?If you are being  discharged the day of surgery, you will not be allowed to drive home. ?You will need a responsible adult (18 years or older) to drive you home and stay with you that night.  ? ?If you are taking public transportation, you will need to have a responsible adult (18 years or older) with you. ?Please confirm with your physician that it is acceptable to use public transportation.   ? ?Please call the [facility] at [phone] if you have any questions about these instructions. ? ?Surgery Visitation Policy: ? ?Patients undergoing a surgery or procedure may have two family members or support persons with them as long as the person is not COVID-19 positive or experiencing its symptoms.  ? ?Inpatient Visitation:   ? ?Visiting hours are 7 a.m. to 8 p.m. ?Up to four visitors are allowed at one time in a patient room, including children. The visitors may rotate out with other people during the day. One designated support person (adult) may remain overnight.  ?

## 2022-04-10 NOTE — Progress Notes (Signed)
Unable to ask abuse questions since patient has dementia and wife is HOA for him. ?

## 2022-04-10 NOTE — Progress Notes (Signed)
Was unable to ask questions from patient he has dementia. Wife answered questions to the best of her knowledge. ?

## 2022-04-12 MED ORDER — CHLORHEXIDINE GLUCONATE 0.12 % MT SOLN
15.0000 mL | Freq: Once | OROMUCOSAL | Status: AC
  Administered 2022-04-13: 15 mL via OROMUCOSAL

## 2022-04-12 MED ORDER — ORAL CARE MOUTH RINSE: 15.0000 mL | Freq: Once | OROMUCOSAL | Status: AC

## 2022-04-12 MED ORDER — LACTATED RINGERS IV SOLN: INTRAVENOUS | Status: DC

## 2022-04-12 MED ORDER — CEFAZOLIN SODIUM-DEXTROSE 2-4 GM/100ML-% IV SOLN
2.0000 g | INTRAVENOUS | Status: AC
  Administered 2022-04-13: 2 g via INTRAVENOUS

## 2022-04-12 MED ORDER — FAMOTIDINE 20 MG PO TABS
20.0000 mg | ORAL_TABLET | Freq: Once | ORAL | Status: AC
Start: 2022-04-12 — End: 2022-04-13

## 2022-04-13 ENCOUNTER — Encounter
Admission: RE | Disposition: A | Discharge status: Home / Self Care | Payer: Self-pay | Source: Ambulatory Visit | Attending: Podiatry

## 2022-04-13 ENCOUNTER — Other Ambulatory Visit: Payer: Self-pay

## 2022-04-13 ENCOUNTER — Ambulatory Visit: Payer: Medicare Other | Admitting: Certified Registered"

## 2022-04-13 ENCOUNTER — Ambulatory Visit
Admission: RE | Admit: 2022-04-13 | Discharge: 2022-04-13 | Disposition: A | Discharge status: Home / Self Care | Payer: Medicare Other | Source: Ambulatory Visit | Attending: Podiatry | Admitting: Podiatry

## 2022-04-13 ENCOUNTER — Encounter: Payer: Self-pay | Admitting: Podiatry

## 2022-04-13 DIAGNOSIS — S98131A Complete traumatic amputation of one right lesser toe, initial encounter: Secondary | ICD-10-CM | POA: Insufficient documentation

## 2022-04-13 DIAGNOSIS — M25871 Other specified joint disorders, right ankle and foot: Secondary | ICD-10-CM | POA: Insufficient documentation

## 2022-04-13 DIAGNOSIS — M19071 Primary osteoarthritis, right ankle and foot: Secondary | ICD-10-CM | POA: Diagnosis not present

## 2022-04-13 DIAGNOSIS — M96830 Postprocedural hemorrhage and hematoma of a musculoskeletal structure following a musculoskeletal system procedure: Secondary | ICD-10-CM | POA: Insufficient documentation

## 2022-04-13 DIAGNOSIS — Z7902 Long term (current) use of antithrombotics/antiplatelets: Secondary | ICD-10-CM | POA: Insufficient documentation

## 2022-04-13 DIAGNOSIS — X58XXXD Exposure to other specified factors, subsequent encounter: Secondary | ICD-10-CM | POA: Insufficient documentation

## 2022-04-13 DIAGNOSIS — I10 Essential (primary) hypertension: Secondary | ICD-10-CM | POA: Insufficient documentation

## 2022-04-13 DIAGNOSIS — F17200 Nicotine dependence, unspecified, uncomplicated: Secondary | ICD-10-CM | POA: Insufficient documentation

## 2022-04-13 DIAGNOSIS — M79674 Pain in right toe(s): Secondary | ICD-10-CM | POA: Insufficient documentation

## 2022-04-13 DIAGNOSIS — F0390 Unspecified dementia without behavioral disturbance: Secondary | ICD-10-CM | POA: Insufficient documentation

## 2022-04-13 DIAGNOSIS — X58XXXA Exposure to other specified factors, initial encounter: Secondary | ICD-10-CM | POA: Insufficient documentation

## 2022-04-13 DIAGNOSIS — Z8673 Personal history of transient ischemic attack (TIA), and cerebral infarction without residual deficits: Secondary | ICD-10-CM | POA: Insufficient documentation

## 2022-04-13 DIAGNOSIS — T85848D Pain due to other internal prosthetic devices, implants and grafts, subsequent encounter: Secondary | ICD-10-CM | POA: Insufficient documentation

## 2022-04-13 DIAGNOSIS — T85848A Pain due to other internal prosthetic devices, implants and grafts, initial encounter: Secondary | ICD-10-CM | POA: Diagnosis present

## 2022-04-13 DIAGNOSIS — K219 Gastro-esophageal reflux disease without esophagitis: Secondary | ICD-10-CM | POA: Diagnosis not present

## 2022-04-13 HISTORY — PX: AMPUTATION TOE: SHX6595

## 2022-04-13 HISTORY — PX: MINOR HARDWARE REMOVAL: SHX6474

## 2022-04-13 HISTORY — PX: ANKLE ARTHROSCOPY: SHX545

## 2022-04-13 HISTORY — PX: WOUND DEBRIDEMENT: SHX247

## 2022-04-13 LAB — BASIC METABOLIC PANEL
Anion gap: 10 (ref 5–15)
BUN: 20 mg/dL (ref 8–23)
CO2: 25 mmol/L (ref 22–32)
Calcium: 9.4 mg/dL (ref 8.9–10.3)
Chloride: 104 mmol/L (ref 98–111)
Creatinine, Ser: 1.12 mg/dL (ref 0.61–1.24)
GFR, Estimated: >60 mL/min (ref >60)
Glucose, Bld: 206 mg/dL — ABNORMAL HIGH (ref 70–99)
Potassium: 4.1 mmol/L (ref 3.5–5.1)
Sodium: 139 mmol/L (ref 135–145)

## 2022-04-13 LAB — CBC WITH DIFFERENTIAL/PLATELET
Abs Immature Granulocytes: 0.04 10*3/uL (ref 0.00–0.07)
Basophils Absolute: 0 10*3/uL (ref 0.0–0.1)
Basophils Relative: 0 %
Eosinophils Absolute: 0 10*3/uL (ref 0.0–0.5)
Eosinophils Relative: 0 %
HCT: 41.5 % (ref 39.0–52.0)
Hemoglobin: 13.3 g/dL (ref 13.0–17.0)
Immature Granulocytes: 0 %
Lymphocytes Relative: 10 %
Lymphs Abs: 1.1 10*3/uL (ref 0.7–4.0)
MCH: 30 pg (ref 26.0–34.0)
MCHC: 32 g/dL (ref 30.0–36.0)
MCV: 93.5 fL (ref 80.0–100.0)
Monocytes Absolute: 0.2 10*3/uL (ref 0.1–1.0)
Monocytes Relative: 2 %
Neutro Abs: 9.6 10*3/uL — ABNORMAL HIGH (ref 1.7–7.7)
Neutrophils Relative %: 88 %
Platelets: 277 10*3/uL (ref 150–400)
RBC: 4.44 MIL/uL (ref 4.22–5.81)
RDW: 14.5 % (ref 11.5–15.5)
WBC: 11 10*3/uL — ABNORMAL HIGH (ref 4.0–10.5)
nRBC: 0 % (ref 0.0–0.2)

## 2022-04-13 SURGERY — AMPUTATION, TOE
Anesthesia: General | Site: Toe | Laterality: Right

## 2022-04-13 MED ORDER — LIDOCAINE HCL (CARDIAC) PF 100 MG/5ML IV SOSY
PREFILLED_SYRINGE | INTRAVENOUS | Status: DC | PRN
Start: 2022-04-13 — End: 2022-04-13
  Administered 2022-04-13: 100 mg via INTRAVENOUS

## 2022-04-13 MED ORDER — FENTANYL CITRATE (PF) 100 MCG/2ML IJ SOLN
INTRAMUSCULAR | Status: DC | PRN
  Administered 2022-04-13 (×2): 50 ug via INTRAVENOUS

## 2022-04-13 MED ORDER — CHLORHEXIDINE GLUCONATE 0.12 % MT SOLN
OROMUCOSAL | Status: AC
  Filled 2022-04-13: qty 15

## 2022-04-13 MED ORDER — EPHEDRINE 5 MG/ML INJ
INTRAVENOUS | Status: AC
  Filled 2022-04-13: qty 5

## 2022-04-13 MED ORDER — DEXAMETHASONE SODIUM PHOSPHATE 10 MG/ML IJ SOLN
INTRAMUSCULAR | Status: AC
  Filled 2022-04-13: qty 1

## 2022-04-13 MED ORDER — ONDANSETRON HCL 4 MG/2ML IJ SOLN
INTRAMUSCULAR | Status: DC | PRN
  Administered 2022-04-13: 4 mg via INTRAVENOUS

## 2022-04-13 MED ORDER — OXYCODONE HCL 5 MG PO TABS: 5.0000 mg | ORAL_TABLET | Freq: Once | ORAL | Status: DC | PRN

## 2022-04-13 MED ORDER — PROPOFOL 10 MG/ML IV BOLUS
INTRAVENOUS | Status: DC | PRN
  Administered 2022-04-13: 120 mg via INTRAVENOUS

## 2022-04-13 MED ORDER — EPHEDRINE SULFATE (PRESSORS) 50 MG/ML IJ SOLN
INTRAMUSCULAR | Status: DC | PRN
Start: 2022-04-13 — End: 2022-04-13
  Administered 2022-04-13 (×2): 10 mg via INTRAVENOUS

## 2022-04-13 MED ORDER — ROCURONIUM BROMIDE 100 MG/10ML IV SOLN
INTRAVENOUS | Status: DC | PRN
  Administered 2022-04-13: 50 mg via INTRAVENOUS

## 2022-04-13 MED ORDER — CEFAZOLIN SODIUM-DEXTROSE 2-4 GM/100ML-% IV SOLN
INTRAVENOUS | Status: AC
  Filled 2022-04-13: qty 100

## 2022-04-13 MED ORDER — SUGAMMADEX SODIUM 200 MG/2ML IV SOLN
INTRAVENOUS | Status: DC | PRN
  Administered 2022-04-13: 200 mg via INTRAVENOUS

## 2022-04-13 MED ORDER — ACETAMINOPHEN 10 MG/ML IV SOLN
INTRAVENOUS | Status: AC
  Filled 2022-04-13: qty 100

## 2022-04-13 MED ORDER — FENTANYL CITRATE (PF) 100 MCG/2ML IJ SOLN
INTRAMUSCULAR | Status: AC
  Filled 2022-04-13: qty 2

## 2022-04-13 MED ORDER — MIDAZOLAM HCL 2 MG/2ML IJ SOLN
INTRAMUSCULAR | Status: AC
  Filled 2022-04-13: qty 2

## 2022-04-13 MED ORDER — BUPIVACAINE LIPOSOME 1.3 % IJ SUSP
INTRAMUSCULAR | Status: AC
  Filled 2022-04-13: qty 20

## 2022-04-13 MED ORDER — PHENYLEPHRINE HCL (PRESSORS) 10 MG/ML IV SOLN
INTRAVENOUS | Status: DC | PRN
  Administered 2022-04-13: 160 ug via INTRAVENOUS

## 2022-04-13 MED ORDER — FAMOTIDINE 20 MG PO TABS
ORAL_TABLET | ORAL | Status: AC
  Administered 2022-04-13: 20 mg via ORAL
  Filled 2022-04-13: qty 1

## 2022-04-13 MED ORDER — 0.9 % SODIUM CHLORIDE (POUR BTL) OPTIME
TOPICAL | Status: DC | PRN
  Administered 2022-04-13: 500 mL

## 2022-04-13 MED ORDER — OXYCODONE-ACETAMINOPHEN 5-325 MG PO TABS: 1.0000 | ORAL_TABLET | Freq: Four times a day (QID) | ORAL | 0 refills | Status: DC | PRN

## 2022-04-13 MED ORDER — PHENYLEPHRINE 80 MCG/ML (10ML) SYRINGE FOR IV PUSH (FOR BLOOD PRESSURE SUPPORT)
PREFILLED_SYRINGE | INTRAVENOUS | Status: AC
  Filled 2022-04-13: qty 10

## 2022-04-13 MED ORDER — LACTATED RINGERS IR SOLN
Status: DC | PRN
  Administered 2022-04-13: 3000 mL

## 2022-04-13 MED ORDER — BUPIVACAINE HCL (PF) 0.5 % IJ SOLN
INTRAMUSCULAR | Status: AC
  Filled 2022-04-13: qty 30

## 2022-04-13 MED ORDER — BUPIVACAINE LIPOSOME 1.3 % IJ SUSP
INTRAMUSCULAR | Status: DC | PRN
  Administered 2022-04-13: 20 mL via INTRAMUSCULAR

## 2022-04-13 MED ORDER — DEXAMETHASONE SODIUM PHOSPHATE 10 MG/ML IJ SOLN
INTRAMUSCULAR | Status: DC | PRN
Start: 2022-04-13 — End: 2022-04-13
  Administered 2022-04-13: 10 mg via INTRAVENOUS

## 2022-04-13 MED ORDER — ACETAMINOPHEN 10 MG/ML IV SOLN
INTRAVENOUS | Status: DC | PRN
  Administered 2022-04-13: 1000 mg via INTRAVENOUS

## 2022-04-13 MED ORDER — FENTANYL CITRATE (PF) 100 MCG/2ML IJ SOLN: 25.0000 ug | INTRAMUSCULAR | Status: DC | PRN

## 2022-04-13 MED ORDER — OXYCODONE HCL 5 MG/5ML PO SOLN: 5.0000 mg | Freq: Once | ORAL | Status: DC | PRN

## 2022-04-13 MED ORDER — ONDANSETRON HCL 4 MG/2ML IJ SOLN
INTRAMUSCULAR | Status: AC
  Filled 2022-04-13: qty 2

## 2022-04-13 SURGICAL SUPPLY — 84 items
"PENCIL ELECTRO HAND CTR " (MISCELLANEOUS) ×3 IMPLANT
ADAPTER IRRIG TUBE 2 SPIKE SOL (ADAPTER) ×8 IMPLANT
ADPR TBG 2 SPK PMP STRL ASCP (ADAPTER) ×6
ARTHROWAND PARAGON T2 (SURGICAL WAND) ×4
BLADE FULL RADIUS 2.9 (BLADE) IMPLANT
BLADE OSC/SAGITTAL MD 5.5X18 (BLADE) ×4 IMPLANT
BLADE SHAVER 2.9D 7 MINI (BLADE) ×4 IMPLANT
BLADE SURG 15 STRL LF DISP TIS (BLADE) IMPLANT
BLADE SURG 15 STRL SS (BLADE) ×8
BLADE SURG MINI STRL (BLADE) ×4 IMPLANT
BNDG CMPR STD VLCR NS LF 5.8X4 (GAUZE/BANDAGES/DRESSINGS) ×6
BNDG COHESIVE 4X5 TAN ST LF (GAUZE/BANDAGES/DRESSINGS) ×4 IMPLANT
BNDG CONFORM 2 STRL LF (GAUZE/BANDAGES/DRESSINGS) ×4 IMPLANT
BNDG CONFORM 3 STRL LF (GAUZE/BANDAGES/DRESSINGS) ×7 IMPLANT
BNDG ELASTIC 4X5.8 VLCR NS LF (GAUZE/BANDAGES/DRESSINGS) ×8 IMPLANT
BNDG ESMARK 4X12 TAN STRL LF (GAUZE/BANDAGES/DRESSINGS) ×4 IMPLANT
BNDG GAUZE ELAST 4 BULKY (GAUZE/BANDAGES/DRESSINGS) ×4 IMPLANT
BOOT STEPPER DURA LG (SOFTGOODS) ×1 IMPLANT
CUFF TOURN SGL QUICK 12 (TOURNIQUET CUFF) IMPLANT
CUFF TOURN SGL QUICK 18X4 (TOURNIQUET CUFF) IMPLANT
CUFF TOURN SGL QUICK 24 (TOURNIQUET CUFF)
CUFF TRNQT CYL 24X4X16.5-23 (TOURNIQUET CUFF) IMPLANT
DRAPE ARTHRO LIMB 89X125 STRL (DRAPES) ×4 IMPLANT
DRAPE FLUOR MINI C-ARM 54X84 (DRAPES) ×4 IMPLANT
DRAPE XRAY CASSETTE 23X24 (DRAPES) ×4 IMPLANT
DURAPREP 26ML APPLICATOR (WOUND CARE) ×4 IMPLANT
ELECT REM PT RETURN 9FT ADLT (ELECTROSURGICAL) ×4
ELECTRODE REM PT RTRN 9FT ADLT (ELECTROSURGICAL) ×3 IMPLANT
ETHIBOND 2 0 GREEN CT 2 30IN (SUTURE) ×4 IMPLANT
GAUZE PACKING IODOFORM 1/2 (PACKING) ×4 IMPLANT
GAUZE SPONGE 4X4 12PLY STRL (GAUZE/BANDAGES/DRESSINGS) ×4 IMPLANT
GAUZE XEROFORM 1X8 LF (GAUZE/BANDAGES/DRESSINGS) ×4 IMPLANT
GLOVE SURG ENC MOIS LTX SZ7.5 (GLOVE) ×4 IMPLANT
GLOVE SURG UNDER LTX SZ8 (GLOVE) ×4 IMPLANT
GOWN STRL REUS W/ TWL XL LVL3 (GOWN DISPOSABLE) ×6 IMPLANT
GOWN STRL REUS W/TWL XL LVL3 (GOWN DISPOSABLE) ×8
IV LACTATED RINGER IRRG 3000ML (IV SOLUTION) ×20
IV LR IRRIG 3000ML ARTHROMATIC (IV SOLUTION) ×15 IMPLANT
IV NS IRRIG 3000ML ARTHROMATIC (IV SOLUTION) ×4 IMPLANT
KIT TURNOVER KIT A (KITS) ×4 IMPLANT
LABEL OR SOLS (LABEL) ×4 IMPLANT
MANIFOLD NEPTUNE II (INSTRUMENTS) ×8 IMPLANT
NDL FILTER BLUNT 18X1 1/2 (NEEDLE) ×3 IMPLANT
NDL HYPO 25X1 1.5 SAFETY (NEEDLE) ×3 IMPLANT
NDL SAFETY ECLIPSE 18X1.5 (NEEDLE) ×3 IMPLANT
NEEDLE FILTER BLUNT 18X 1/2SAF (NEEDLE) ×1
NEEDLE FILTER BLUNT 18X1 1/2 (NEEDLE) ×3 IMPLANT
NEEDLE HYPO 18GX1.5 SHARP (NEEDLE) ×4
NEEDLE HYPO 25X1 1.5 SAFETY (NEEDLE) ×4 IMPLANT
NS IRRIG 500ML POUR BTL (IV SOLUTION) ×4 IMPLANT
PACK ARTHROSCOPY KNEE (MISCELLANEOUS) ×4 IMPLANT
PACK EXTREMITY ARMC (MISCELLANEOUS) ×4 IMPLANT
PAD ABD DERMACEA PRESS 5X9 (GAUZE/BANDAGES/DRESSINGS) ×8 IMPLANT
PENCIL ELECTRO HAND CTR (MISCELLANEOUS) ×4 IMPLANT
PULSAVAC PLUS IRRIG FAN TIP (DISPOSABLE) ×4
RESECTOR FULL RADIUS 2.9 (ORTHOPEDIC DISPOSABLE SUPPLIES) IMPLANT
SHIELD FULL FACE ANTIFOG 7M (MISCELLANEOUS) ×4 IMPLANT
SPONGE T-LAP 18X18 ~~LOC~~+RFID (SPONGE) ×4 IMPLANT
STOCKINETTE M/LG 89821 (MISCELLANEOUS) ×4 IMPLANT
STRAP ANKLE FOOT DISTRACTOR (ORTHOPEDIC SUPPLIES) IMPLANT
STRAP SAFETY 5IN WIDE (MISCELLANEOUS) ×4 IMPLANT
SUT ETH BLK MONO 3 0 FS 1 12/B (SUTURE) ×4 IMPLANT
SUT ETHIBOND GREEN BRAID 0S 4 (SUTURE) ×16 IMPLANT
SUT ETHILON 3-0 FS-10 30 BLK (SUTURE) ×4
SUT ETHILON 4-0 (SUTURE) ×4
SUT ETHILON 4-0 FS2 18XMFL BLK (SUTURE) ×3
SUT ETHILON 5-0 FS-2 18 BLK (SUTURE) ×4 IMPLANT
SUT PDS II 3-0 (SUTURE) ×4 IMPLANT
SUT VIC AB 3-0 SH 27 (SUTURE) ×4
SUT VIC AB 3-0 SH 27X BRD (SUTURE) ×3 IMPLANT
SUT VIC AB 4-0 FS2 27 (SUTURE) ×4 IMPLANT
SUT VIC AB 4-0 SH 27 (SUTURE) ×4
SUT VIC AB 4-0 SH 27XANBCTRL (SUTURE) ×3 IMPLANT
SUTURE EHLN 3-0 FS-10 30 BLK (SUTURE) ×3 IMPLANT
SUTURE ETHLN 4-0 FS2 18XMF BLK (SUTURE) ×3 IMPLANT
SYR 10ML LL (SYRINGE) ×12 IMPLANT
TIP FAN IRRIG PULSAVAC PLUS (DISPOSABLE) ×3 IMPLANT
TUBING CONNECTING 10 (TUBING) ×4 IMPLANT
TUBING INFLOW SET DBFLO PUMP (TUBING) ×4 IMPLANT
TUBING OUTFLOW SET DBLFO PUMP (TUBING) ×4 IMPLANT
WAND ARTHRO PARAGON T2 (SURGICAL WAND) IMPLANT
WAND COVAC 50 IFS (MISCELLANEOUS) ×1 IMPLANT
WAND TOPAZ MICRO DEBRIDER (MISCELLANEOUS) IMPLANT
WATER STERILE IRR 500ML POUR (IV SOLUTION) ×4 IMPLANT

## 2022-04-13 NOTE — Anesthesia Postprocedure Evaluation (Signed)
Anesthesia Post Note ? ?Patient: Albert [name] ? ?Procedure(s) Performed: 28820 - AMPUTATION TOE (Right: Toe) ?A-SCOPE/OCD REPAIR (Right: Ankle) ?[id number] - SUBTALAR ARTHROSCOPY (Right: Ankle) ?REMOVAL SCREW; DEEP (Right: Foot) ? ?Patient location during evaluation: PACU ?Anesthesia Type: General ?Level of consciousness: awake and alert ?Pain management: pain level controlled ?Vital Signs Assessment: post-procedure vital signs reviewed and stable ?Respiratory status: spontaneous breathing, nonlabored ventilation, respiratory function stable and patient connected to nasal cannula oxygen ?Cardiovascular status: blood pressure returned to baseline and stable ?Postop Assessment: no apparent nausea or vomiting ?Anesthetic complications: no ? ? ?No notable events documented. ? ? ?Last Vitals:  ?Vitals:  ? 04/13/22 1430 04/13/22 1455  ?BP: 118/74 128/75  ?Pulse: 80 79  ?Resp: 18 16  ?Temp: 36.4 ?C   ?SpO2: 94% 95%  ?  ?Last Pain:  ?Vitals:  ? 04/13/22 1455  ?TempSrc:   ?PainSc: 0-No pain  ? ? ?  ?  ?  ?  ?  ?  ? ?Albert [name] ? ? ? ? ?

## 2022-04-13 NOTE — Anesthesia Preprocedure Evaluation (Addendum)
Anesthesia Evaluation  ?Patient identified by MRN, date of birth, ID band ?Patient confused ? ? ? ?Reviewed: ?Allergy & Precautions, NPO status , Patient's Chart, lab work & pertinent test results ? ?Airway ?Mallampati: III ? ?TM Distance: >3 FB ?Neck ROM: full ? ? ? Dental ? ?(+) Chipped, Poor Dentition, Missing ?  ?Pulmonary ?neg shortness of breath, Current Smoker and Patient abstained from smoking.,  ?  ?Pulmonary exam normal ? ? ? ? ? ? ? Cardiovascular ?Exercise Tolerance: Good ?hypertension, (-) angina(-) DOE Normal cardiovascular exam ? ? ?  ?Neuro/Psych ? Headaches, PSYCHIATRIC DISORDERS CVA   ? GI/Hepatic ?Neg liver ROS, GERD  Controlled,  ?Endo/Other  ?negative endocrine ROS ? Renal/GU ?Renal disease  ? ?  ?Musculoskeletal ? ?(+) Arthritis ,  ? Abdominal ?  ?Peds ? Hematology ?negative hematology ROS ?(+)   ?Anesthesia Other Findings ?Past Medical History: ?No date: Anxiety ?    Comment:  takes Xanax daily as needed ?No date: Arthritis ?    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    Comment:  arising in the senium and presenium ?No date: Displaced fracture of proximal phalanx of right lesser  ?toe(s), initial encounter for closed fracture ?No date: Diverticulosis ?No date: GERD (gastroesophageal reflux disease) ?    Comment:  OTC meds as needed ?No date: Headache ?No date: History of colon polyps ?    Comment:  benign  ?No date: History of kidney stones ?No date: History of migraine ?    Comment:  takes Imitrex daily as needed.Last one 2 wks ago ?No date: Hyperlipidemia ?    Comment:  hx of-has been off of meds for about a yr ?No date: Hypertension ?No date: Insomnia ?    Comment:  only recent d/t pain ?No date: Joint pain ?No date: Nephrolithiasis ?No date: Pre-diabetes ?No date: Stroke [facility]) ?    Comment:  without residual deficits ? ?Past Surgical History: ?1997: ANKLE SURGERY; Right ?     Comment:  ankles/screws  ?1993: ARTHROSCOPIC REPAIR ACL; Right ?2017: BACK SURGERY ?    Comment:  titanium rods and pins in lower back ?No date: COLONOSCOPY ?04/10/2017: EPIDIDYMECTOMY; Left ?    Comment:  Procedure: EPIDIDYMECTOMY;  Surgeon: [name], MD; ?             Location: [facility] ORS;  Service: Urology;  Laterality: Left; ?No date: ESOPHAGOGASTRODUODENOSCOPY ?March 13, 2016: LUMBAR FUSION ?    Comment:  L4/L5 fusion ?1962: TONSILLECTOMY ?    Comment:  adenoidectomy  ?1973: VASECTOMY ? ?BMI   ? Body Mass Index: 21.96 kg/m?  ?  ? ? Reproductive/Obstetrics ?negative OB ROS ? ?  ? ? ? ? ? ? ? ? ? ? ? ? ? ?  ?  ? ? ? ? ? ? ? ? ?Anesthesia Physical ?Anesthesia Plan ? ?ASA: 3 ? ?Anesthesia Plan: General ETT  ? ?Post-op Pain Management:   ? ?Induction: Intravenous ? ?PONV Risk Score and Plan: Ondansetron, Dexamethasone, Midazolam and Treatment may vary due to age or medical condition ? ?Airway Management Planned: Oral ETT ? ?Additional Equipment:  ? ?Intra-op Plan:  ? ?Post-operative Plan: Extubation in OR ? ?Informed Consent: I have reviewed the patients History and Physical, chart, labs and discussed the procedure including the risks, benefits and alternatives for the proposed anesthesia with the patient or authorized representative who has indicated his/her understanding and acceptance.  ? ? ? ?  Dental Advisory Given ? ?Plan Discussed with: Anesthesiologist, CRNA and Surgeon ? ?Anesthesia Plan Comments: (Patient and wife consented for risks of anesthesia including but not limited to:  ?- adverse reactions to medications ?- damage to eyes, teeth, lips or other oral mucosa ?- nerve damage due to positioning  ?- sore throat or hoarseness ?- Damage to heart, brain, nerves, lungs, other parts of body or loss of life ? ?They voiced understanding.)  ? ? ? ? ? ?Anesthesia Quick Evaluation ? ?

## 2022-04-13 NOTE — Op Note (Signed)
Operative note ? ? Surgeon:Senaida Chilcote Vickki Muff ? ?  Assistant: None ? ?  Preop diagnosis: 1.  Painful retained distal fibular plate right ankle 2.  Ankle arthritis and synovitis 3.  Osteochondral lesion medial shoulder right ankle 4.  Subtalar joint arthritis 5.  Painful right fifth toe ? ?  Postop diagnosis: Same ? ?  Procedure: 1.  Removal of retained painful screws and plate right distal fibula 2.  Ankle arthroscopy with extensive debridement right ankle joint 3.  Arthroscopically assisted osteochondral defect repair right ankle 4.  Right subtalar joint arthroscopy with extensive debridement 5.  Amputation right fifth toe MTPJ 6.  Intraoperative fluoroscopy use ? ?  EBL: Minimal ? ?  Anesthesia:local and general ? ?  Hemostasis: Mid calf tourniquet inflated to 200 mmHg for approximately 75 minutes ? ?  Specimen: None ? ?  Complications: None ? ?  Operative indications:Albert [name] is an 76 y.o. that presents today for surgical intervention.  The risks/benefits/alternatives/complications have been discussed and consent has been given. ? ?  Procedure:  ?Patient was brought into the OR and placed on the operating table in thesupine position. After anesthesia was obtained theright lower extremity was prepped and draped in usual sterile fashion. ? ?Attention was initially directed to the distal lateral aspect of the right fibula where longitudinal incision was performed along the fibular plate region.  Full-thickness flaps were created down to the plate.  The plate was then exposed.  There was some bone growth around the plate that was removed with a curette and osteotome.  After removal of the screws and plate the areas were smoothed with a rasp and rongeur.  The wound was flushed with copious amounts of irrigation.  Closure was performed with a 3-0 Vicryl for the deeper and subcutaneous tissue and a 3-0 nylon for skin.  Intraoperative fluoroscopy was used to visualize the distal hardware and confirm removal of all  screws and plates from the fibula. ? ?Attention was directed to the anteromedial and anterolateral aspect of the right ankle.  Where portals were placed for the ankle arthroscopy.  The arthroscopy was performed.  There was noted to be an extensive amount of fibrotic debris with osteoarthritis throughout the ankle joint.  With a small joint shaver I was able to remove a large amount of the fibrotic tissue and the chondromalacia within the ankle joint.  On the medial shoulder of the talus was a noted osteochondral defect.  The edges of the defect were debrided away with a curette and shaver.  Next with a Paragon wand I was able to smooth the edges of the osteochondral defect.  Further evaluation just revealed diffuse chondromalacia and moderate osteoarthritic changes throughout the ankle. ? ?Attention was directed to the lateral aspect of the sinus tarsi subtalar joint where a small portal was placed initially at the sinus tarsi.  The arthroscopy equipment was entered into the joint.  Next a small shaver was placed and the anterior facet and subtalar joint were debrided away all fibrotic tissue.  The arthroscopy equipment was removed. ? ?Attention was directed to the right fifth toe where full-thickness flaps were created at the level of the MTPJ.  The toe was disarticulated at the metatarsophalangeal joint.  Closure was performed with a 3-0 Vicryl and a 3-0 nylon.  The ankle arthroscopy portals and subtalar portals were closed with a 3-0 nylon.  A bulky sterile dressing was applied to all areas.  Patient was placed in a neutral position in  an Naval architect. ? ? ? ?  Patient tolerated the procedure and anesthesia well.  Was transported from the OR to the PACU with all vital signs stable and vascular status intact. To be discharged per routine protocol.  Will follow up in approximately 1 week in the outpatient clinic. ? ?

## 2022-04-13 NOTE — Discharge Instructions (Addendum)
[facility]  POST OPERATIVE INSTRUCTIONS FOR DR. FOWLER AND [facility]   Take your medication as prescribed.  Pain medication should be taken only as needed.  Keep the dressing clean, dry and intact.  Keep your foot elevated above the heart level for the first 48 hours.  Walking to the bathroom and brief periods of walking are acceptable, unless we have instructed you to be non-weight bearing.  Always wear your post-op shoe when walking.  Always use your crutches if you are to be non-weight bearing.  Do not take a shower. Baths are permissible as long as the foot is kept out of the water.   Every hour you are awake:  Bend your knee 15 times. Flex foot 15 times Massage calf 15 times  Call [facility] ([phone]) if any of the following problems occur: You develop a temperature or fever. The bandage becomes saturated with blood. Medication does not stop your pain. Injury of the foot occurs. Any symptoms of infection including redness, odor, or red streaks running from wound.    AMBULATORY SURGERY  DISCHARGE INSTRUCTIONS   The drugs that you were given will stay in your system until tomorrow so for the next 24 hours you should not:  Drive an automobile Make any legal decisions Drink any alcoholic beverage   You may resume regular meals tomorrow.  Today it is better to start with liquids and gradually work up to solid foods.  You may eat anything you prefer, but it is better to start with liquids, then soup and crackers, and gradually work up to solid foods.   Please notify your doctor immediately if you have any unusual bleeding, trouble breathing, redness and pain at the surgery site, drainage, fever, or pain not relieved by medication.    Additional Instructions:        Please contact your physician with any problems or Same Day Surgery at [phone], Monday through  Friday 6 am to 4 pm, or [place] [facility] [place] [facility] number at [phone]. 

## 2022-04-13 NOTE — Transfer of Care (Signed)
Immediate Anesthesia Transfer of Care Note ? ?Patient: Albert [name] ? ?Procedure(s) Performed: 28820 - AMPUTATION TOE (Right: Toe) ?A-SCOPE/OCD REPAIR (Right: Ankle) ?[id number] - SUBTALAR ARTHROSCOPY (Right: Ankle) ?REMOVAL SCREW; DEEP (Right: Foot) ? ?Patient Location: PACU ? ?Anesthesia Type:General ? ?Level of Consciousness: drowsy and patient cooperative ? ?Airway & Oxygen Therapy: Patient Spontanous Breathing and Patient connected to face mask oxygen ? ?Post-op Assessment: Report given to RN and Post -op Vital signs reviewed and stable ? ?Post vital signs: Reviewed and stable ? ?Last Vitals:  ?Vitals Value Taken Time  ?BP 139/87 04/13/22 1351  ?Temp 36.5 ?C 04/13/22 1351  ?Pulse 83 04/13/22 1351  ?Resp 16 04/13/22 1351  ?SpO2 99 % 04/13/22 1351  ?Vitals shown include unvalidated device data. ? ?Last Pain:  ?Vitals:  ? 04/13/22 0923  ?TempSrc: Temporal  ?PainSc: 0-No pain  ?   ? ?  ? ?Complications: No notable events documented. ?

## 2022-04-13 NOTE — ED Triage Notes (Signed)
Pt arrives with c/o of excessive bleeding after having his pinky toe amputated on the right side. Pt has soaked through multiple ABD pads, gauze, and ace wrap.  ?

## 2022-04-13 NOTE — Anesthesia Procedure Notes (Signed)
Procedure Name: Intubation ?Date/Time: 04/13/2022 11:55 AM ?Performed by: Kelton Pillar, CRNA ?Pre-anesthesia Checklist: Patient identified, Emergency Drugs available, Suction available and Patient being monitored ?Patient Re-evaluated:Patient Re-evaluated prior to induction ?Oxygen Delivery Method: Circle system utilized ?Preoxygenation: Pre-oxygenation with 100% oxygen ?Induction Type: IV induction ?Ventilation: Mask ventilation without difficulty ?Laryngoscope Size: McGraph, 3 and Mac ?Tube type: Oral ?Tube size: 7.0 mm ?Number of attempts: 1 ?Airway Equipment and Method: Stylet and Oral airway ?Placement Confirmation: ETT inserted through vocal cords under direct vision, positive ETCO2 and breath sounds checked- equal and bilateral ?Secured at: 21 cm ?Tube secured with: Tape ?Dental Injury: Teeth and Oropharynx as per pre-operative assessment  ? ? ? ? ?

## 2022-04-13 NOTE — H&P (Signed)
HISTORY AND PHYSICAL INTERVAL NOTE: ? ?04/13/2022 ? ?11:24 AM ? ?Albert [name]  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  The various methods of treatment have been discussed with the patient.  No guarantees were given.  After consideration of risks, benefits and other options for treatment, the patient has consented to surgery.  I have reviewed the patients? chart and labs.   ? ? ?A history and physical examination was performed in my office.  The patient was reexamined.  There have been no changes to this history and physical examination. ? ?Samara Deist A ? ?

## 2022-04-14 ENCOUNTER — Encounter: Payer: Self-pay | Admitting: Podiatry

## 2022-04-14 ENCOUNTER — Emergency Department
Admission: EM | Admit: 2022-04-14 | Discharge: 2022-04-14 | Disposition: A | Discharge status: Home / Self Care | Payer: Medicare Other | Source: Home / Self Care | Attending: Emergency Medicine | Admitting: Emergency Medicine

## 2022-04-14 DIAGNOSIS — M96830 Postprocedural hemorrhage and hematoma of a musculoskeletal structure following a musculoskeletal system procedure: Secondary | ICD-10-CM

## 2022-04-14 NOTE — ED Provider Notes (Signed)
? ?[facility] ?Provider Note ? ? ? Event Date/Time  ? First MD Initiated Contact with Patient 04/14/22 0246   ?  (approximate) ? ? ?History  ? ?Post-op Problem ? ? ?HPI ? ?Albert [name] is a 76 y.o. male who presents for evaluation of bleeding.  Patient underwent a right fifth toe amputation earlier today.  Since going home the surgical site has been bleeding uncontrollably.  Patient is on Plavix but that has been held for about a week.  Patient denies any pain. ?  ? ? ?Past Medical History:  ?Diagnosis Date  ? Anxiety   ? takes Xanax daily as needed  ? Arthritis   ? knees  ? Back pain   ? Barrett esophagus   ? Chronic back pain   ? Dementia (HCC)   ? arising in the senium and presenium  ? Displaced fracture of proximal phalanx of right lesser toe(s), initial encounter for closed fracture   ? Diverticulosis   ? GERD (gastroesophageal reflux disease)   ? OTC meds as needed  ? Headache   ? History of colon polyps   ? benign   ? History of kidney stones   ? History of migraine   ? takes Imitrex daily as needed.Last one 2 wks ago  ? Hyperlipidemia   ? hx of-has been off of meds for about a yr  ? Hypertension   ? Insomnia   ? only recent d/t pain  ? Joint pain   ? Nephrolithiasis   ? Pre-diabetes   ? Stroke [facility])   ? without residual deficits  ? ? ?Past Surgical History:  ?Procedure Laterality Date  ? ANKLE SURGERY Right 1997  ? ankles/screws   ? ARTHROSCOPIC REPAIR ACL Right 1993  ? BACK SURGERY  2017  ? titanium rods and pins in lower back  ? COLONOSCOPY    ? EPIDIDYMECTOMY Left 04/10/2017  ? Procedure: EPIDIDYMECTOMY;  Surgeon: [name], MD;  Location: [facility] ORS;  Service: Urology;  Laterality: Left;  ? ESOPHAGOGASTRODUODENOSCOPY    ? LUMBAR FUSION  March 13, 2016  ? L4/L5 fusion  ? TONSILLECTOMY  1962  ? adenoidectomy   ? VASECTOMY  1973  ? ? ? ?Physical Exam  ? ?Triage Vital Signs: ?ED Triage Vitals  ?Enc Vitals Group  ?   BP 04/13/22 2158 (!) 151/83  ?   Pulse Rate 04/13/22 2158 93   ?   Resp 04/13/22 2158 19  ?   Temp 04/13/22 2158 97.9 ?F (36.6 ?C)  ?   Temp Source 04/13/22 2158 Oral  ?   SpO2 04/13/22 2158 94 %  ?   Weight 04/13/22 2159 171 lb (77.6 kg)  ?   Height 04/13/22 2159 6\' 2"  (1.88 m)  ?   Head Circumference --   ?   Peak Flow --   ?   Pain Score 04/13/22 2159 5  ?   Pain Loc --   ?   Pain Edu? --   ?   Excl. in GC? --   ? ? ?Most recent vital signs: ?Vitals:  ? 04/14/22 0155 04/14/22 0259  ?BP: (!) 160/92 (!) 155/72  ?Pulse: 86 85  ?Resp: 18 17  ?Temp:    ?SpO2: 97% 96%  ? ? ? ?Constitutional: Alert and oriented. Well appearing and in no apparent distress. ?HEENT: ?     Head: Normocephalic and atraumatic.    ?     Eyes: Conjunctivae are normal. Sclera is non-icteric.  ?  Mouth/Throat: Mucous membranes are moist.  ?     Neck: Supple with no signs of meningismus. ?Cardiovascular: Regular rate and rhythm.  ?Respiratory: Normal respiratory effort.  ?Musculoskeletal: Dressing was removed, stitches in place with no dehiscence.  Dressing was covered in blood but there is no signs of active bleeding.  There was some oozing from one location.   ?Neurologic: Normal speech and language. Face is symmetric. Moving all extremities. No gross focal neurologic deficits are appreciated. ?Skin: Skin is warm, dry and intact. No rash noted. ?Psychiatric: Mood and affect are normal. Speech and behavior are normal. ? ?ED Results / Procedures / Treatments  ? ?Labs ?(all labs ordered are listed, but only abnormal results are displayed) ?Labs Reviewed  ?CBC WITH DIFFERENTIAL/PLATELET - Abnormal; Notable for the following components:  ?    Result Value  ? WBC 11.0 (*)   ? Neutro Abs 9.6 (*)   ? All other components within normal limits  ?BASIC METABOLIC PANEL - Abnormal; Notable for the following components:  ? Glucose, Bld 206 (*)   ? All other components within normal limits  ? ? ? ?EKG ? ?none ? ? ?RADIOLOGY ?none ? ? ?PROCEDURES: ? ?Critical Care performed: No ? ?Procedures ? ? ? ?IMPRESSION / MDM /  ASSESSMENT AND PLAN / ED COURSE  ?I reviewed the triage vital signs and the nursing notes. ? ? 76 y.o. male who presents for evaluation of bleeding from surgical site after having his right fifth toe amputated earlier today.  His Plavix has been held for a week prior to the procedure.  Hemodynamically stable.  With normal hemoglobin of 13.3.  Dressing over the surgical site was covered in blood however once I removed it there is no active bleeding.  There was a very slow oozing from one of the surgical incisions.  Surgicel and pressure dressing was applied over.  Patient was monitored for 40 minutes with no recurrence of the bleeding.  Recommended keeping the dressing in place until he sees Dr. Ether Griffins again.  Discussed management if bleeding recurs at home and my standard return precautions ? ?MEDICATIONS GIVEN IN ED: ?Medications - No data to display ? ? ?EMR reviewed including records from patient's surgery from today. ? ? ? ?FINAL CLINICAL IMPRESSION(S) / ED DIAGNOSES  ? ?Final diagnoses:  ?Postoperative hemorrhage of musculoskeletal structure following musculoskeletal procedure  ? ? ? ?Rx / DC Orders  ? ?ED Discharge Orders   ? ? None  ? ?  ? ? ? ?Note:  This document was prepared using Dragon voice recognition software and may include unintentional dictation errors. ? ? ?Please note:  Patient was evaluated in Emergency Department today for the symptoms described in the history of present illness. Patient was evaluated in the context of the global COVID-19 pandemic, which necessitated consideration that the patient might be at risk for infection with the SARS-CoV-2 virus that causes COVID-19. Institutional protocols and algorithms that pertain to the evaluation of patients at risk for COVID-19 are in a state of rapid change based on information released by regulatory bodies including the CDC and federal and state organizations. These policies and algorithms were followed during the patient's care in the ED.   Some ED evaluations and interventions may be delayed as a result of limited staffing during the pandemic. ? ? ? ? ?  ?Nita Sickle, MD ?04/14/22 [phone] ? ?

## 2022-04-14 NOTE — ED Notes (Signed)
Dr. Alfred Levins at the bedside to evaluate pt ?

## 2022-04-14 NOTE — ED Notes (Signed)
Ace wrap applied over pt's right foot bandage ?

## 2022-04-14 NOTE — ED Notes (Signed)
Dressing applied by Dr. Don Perking.  ?

## 2022-04-14 NOTE — Discharge Instructions (Signed)
If the bleeding restarts, make sure to apply pressure for at least 30 minutes.  If you are unable to stop the bleeding please return to the ER or call Dr. Alvera Singh office. ?

## 2022-05-14 ENCOUNTER — Emergency Department
Admission: EM | Admit: 2022-05-14 | Discharge: 2022-05-14 | Disposition: A | Discharge status: Home / Self Care | Payer: Medicare Other | Attending: Emergency Medicine | Admitting: Emergency Medicine

## 2022-05-14 ENCOUNTER — Encounter: Payer: Self-pay | Admitting: *Deleted

## 2022-05-14 ENCOUNTER — Other Ambulatory Visit: Payer: Self-pay

## 2022-05-14 DIAGNOSIS — K648 Other hemorrhoids: Secondary | ICD-10-CM | POA: Diagnosis not present

## 2022-05-14 DIAGNOSIS — K6289 Other specified diseases of anus and rectum: Secondary | ICD-10-CM | POA: Diagnosis present

## 2022-05-14 LAB — COMPREHENSIVE METABOLIC PANEL
ALT: 29 U/L (ref 0–44)
AST: 29 U/L (ref 15–41)
Albumin: 4.1 g/dL (ref 3.5–5.0)
Alkaline Phosphatase: 78 U/L (ref 38–126)
Anion gap: 7 (ref 5–15)
BUN: 20 mg/dL (ref 8–23)
CO2: 26 mmol/L (ref 22–32)
Calcium: 9 mg/dL (ref 8.9–10.3)
Chloride: 106 mmol/L (ref 98–111)
Creatinine, Ser: 0.92 mg/dL (ref 0.61–1.24)
GFR, Estimated: >60 mL/min (ref >60)
Glucose, Bld: 129 mg/dL — ABNORMAL HIGH (ref 70–99)
Potassium: 3.5 mmol/L (ref 3.5–5.1)
Sodium: 139 mmol/L (ref 135–145)
Total Bilirubin: 0.2 mg/dL — ABNORMAL LOW (ref 0.3–1.2)
Total Protein: 6.6 g/dL (ref 6.5–8.1)

## 2022-05-14 LAB — CBC
HCT: 37.5 % — ABNORMAL LOW (ref 39.0–52.0)
Hemoglobin: 12.2 g/dL — ABNORMAL LOW (ref 13.0–17.0)
MCH: 30.1 pg (ref 26.0–34.0)
MCHC: 32.5 g/dL (ref 30.0–36.0)
MCV: 92.6 fL (ref 80.0–100.0)
Platelets: 277 10*3/uL (ref 150–400)
RBC: 4.05 MIL/uL — ABNORMAL LOW (ref 4.22–5.81)
RDW: 14 % (ref 11.5–15.5)
WBC: 8.2 10*3/uL (ref 4.0–10.5)
nRBC: 0 % (ref 0.0–0.2)

## 2022-05-14 MED ORDER — LIDOCAINE 5 % EX PTCH
1.0000 | MEDICATED_PATCH | Freq: Once | CUTANEOUS | Status: DC
  Administered 2022-05-14: 1 via TRANSDERMAL
  Filled 2022-05-14: qty 1

## 2022-05-14 MED ORDER — DIBUCAINE (PERIANAL) 1 % EX OINT
TOPICAL_OINTMENT | Freq: Once | CUTANEOUS | Status: DC
  Filled 2022-05-14: qty 28

## 2022-05-14 MED ORDER — HYDROCORTISONE ACETATE 25 MG RE SUPP
25.0000 mg | Freq: Once | RECTAL | Status: AC
  Administered 2022-05-14: 25 mg via RECTAL
  Filled 2022-05-14: qty 1

## 2022-05-14 MED ORDER — HYDROCORTISONE ACETATE 25 MG RE SUPP: 25.0000 mg | Freq: Two times a day (BID) | RECTAL | 0 refills | Status: AC

## 2022-05-14 MED ORDER — DIBUCAINE (PERIANAL) 1 % EX OINT: 1.0000 "application " | TOPICAL_OINTMENT | Freq: Three times a day (TID) | CUTANEOUS | 0 refills | Status: DC | PRN

## 2022-05-14 MED ORDER — LIDOCAINE 5 % EX PTCH: 1.0000 | MEDICATED_PATCH | Freq: Two times a day (BID) | CUTANEOUS | 0 refills | Status: AC | PRN

## 2022-05-14 NOTE — ED Triage Notes (Signed)
Pt reports burning sensation of anus for 2 weeks after BM's.  No diarrhea.  Pt also has itching .  Hx hemorrhoids.  States no blood seen in stools.  Pt alert.

## 2022-05-14 NOTE — Discharge Instructions (Signed)
Use the lidocaine gel and suppositories as needed. Use the lidocaine patches on the foot/ankle as directed.

## 2022-05-14 NOTE — ED Provider Notes (Signed)
[facility] Emergency Department Provider Note     Event Date/Time   First MD Initiated Contact with Patient 05/14/22 2038     (approximate)   History   Hemorrhoids   HPI  Albert [name] is a 76 y.o. male with a history of hemorrhoids, presents to the ED with burning sensation to the rectum for the last 2 weeks, increasing over the last few days.  He reports onset of symptoms following his bowel movements.  He denies any diarrhea or bloody stools.  He does endorse some itching at the rectum. He denies any firm stool or constipation.    Physical Exam   Triage Vital Signs: ED Triage Vitals  Enc Vitals Group     BP 05/14/22 2000 (!) 142/118     Pulse Rate 05/14/22 2000 89     Resp 05/14/22 2000 20     Temp 05/14/22 2000 98.3 F (36.8 C)     Temp Source 05/14/22 2000 Oral     SpO2 05/14/22 2000 95 %     Weight 05/14/22 2001 171 lb (77.6 kg)     Height 05/14/22 2001 6\' 2"  (1.88 m)     Head Circumference --      Peak Flow --      Pain Score 05/14/22 2001 10     Pain Loc --      Pain Edu? --      Excl. in GC? --     Most recent vital signs: Vitals:   05/14/22 2000 05/14/22 2307  BP: (!) 142/118 (!) 149/84  Pulse: 89 68  Resp: 20 20  Temp: 98.3 F (36.8 C)   SpO2: 95% 95%    General Awake, no distress.  CV:  Good peripheral perfusion.  RESP:  Normal effort.  ABD:  No distention. Normal rectal exam with an internal hemorrhoid noted.  MSK:  Right ankle with well-healed lateral scar and recent 5th toe amputation. No signs of infection or wound dehiscence.    ED Results / Procedures / Treatments   Labs (all labs ordered are listed, but only abnormal results are displayed) Labs Reviewed  COMPREHENSIVE METABOLIC PANEL - Abnormal; Notable for the following components:      Result Value   Glucose, Bld 129 (*)    Total Bilirubin 0.2 (*)    All other components within normal limits  CBC - Abnormal; Notable for the following  components:   RBC 4.05 (*)    Hemoglobin 12.2 (*)    HCT 37.5 (*)    All other components within normal limits     EKG    RADIOLOGY   No results found.   PROCEDURES:  Critical Care performed: No  Procedures   MEDICATIONS ORDERED IN ED: Medications  lidocaine (LIDODERM) 5 % 1 patch (has no administration in time range)  dibucaine (NUPERCAINAL) 1 % rectal ointment ( Rectal Not Given 05/14/22 2250)  hydrocortisone (ANUSOL-HC) suppository 25 mg (25 mg Rectal Given by Other 05/14/22 2320)     IMPRESSION / MDM / ASSESSMENT AND PLAN / ED COURSE  I reviewed the triage vital signs and the nursing notes.                              Differential diagnosis includes, but is not limited to, hemorrhoids, anal fissure, rectal abscess  Patient's diagnosis is consistent with hemorrhoids and rectal pruritis. Patient will be discharged home with  prescriptions for Anusol suppositories and Dibucaine . Patient is to follow up with his PCP as needed or otherwise directed. Patient is given ED precautions to return to the ED for any worsening or new symptoms.   FINAL CLINICAL IMPRESSION(S) / ED DIAGNOSES   Final diagnoses:  Rectal pain  Internal hemorrhoid     Rx / DC Orders   ED Discharge Orders          Ordered    dibucaine (NUPERCAINAL) 1 % OINT  3 times daily PRN        05/14/22 2322    hydrocortisone (ANUSOL-HC) 25 MG suppository  Every 12 hours        05/14/22 2322    lidocaine (LIDODERM) 5 %  Every 12 hours PRN        05/14/22 2322             Note:  This document was prepared using Dragon voice recognition software and may include unintentional dictation errors.    Melvenia Needles, PA-C 05/14/22 2327    Lavonia Drafts, MD 05/15/22 1257

## 2023-04-12 ENCOUNTER — Ambulatory Visit
Admission: EM | Admit: 2023-04-12 | Discharge: 2023-04-12 | Disposition: A | Discharge status: Home / Self Care | Payer: Medicare Other | Attending: Urgent Care | Admitting: Urgent Care

## 2023-04-12 DIAGNOSIS — K6289 Other specified diseases of anus and rectum: Secondary | ICD-10-CM

## 2023-04-12 DIAGNOSIS — K649 Unspecified hemorrhoids: Secondary | ICD-10-CM

## 2023-04-12 MED ORDER — DIBUCAINE (PERIANAL) 1 % EX OINT: 1.0000 | TOPICAL_OINTMENT | Freq: Three times a day (TID) | CUTANEOUS | 0 refills | Status: DC | PRN

## 2023-04-12 MED ORDER — HYDROCORTISONE ACETATE 25 MG RE SUPP: 25.0000 mg | Freq: Two times a day (BID) | RECTAL | 0 refills | Status: DC

## 2023-04-12 NOTE — ED Provider Notes (Signed)
[facility] Albert [name]    CSN: [id number] Arrival date & time: 04/12/23  1721      History   Chief Complaint Chief Complaint  Patient presents with   Hemorrhoids    HPI Albert [name] is a 77 y.o. male.   HPI  Patient presents to urgent care with complaint of burning and itching at his rectum.  He states that pain is worse with ambulation.  Patient was treated 6 months ago in the ED for hemorrhoid has been using hydrocortisone cream and suppository.  Past Medical History:  Diagnosis Date   Anxiety    takes Xanax daily as needed   Arthritis    knees   Back pain    Barrett esophagus    Chronic back pain    Dementia (HCC)    arising in the senium and presenium   Displaced fracture of proximal phalanx of right lesser toe(s), initial encounter for closed fracture    Diverticulosis    GERD (gastroesophageal reflux disease)    OTC meds as needed   Headache    History of colon polyps    benign    History of kidney stones    History of migraine    takes Imitrex daily as needed.Last one 2 wks ago   Hyperlipidemia    hx of-has been off of meds for about a yr   Hypertension    Insomnia    only recent d/t pain   Joint pain    Nephrolithiasis    Pre-diabetes    Stroke [facility])    without residual deficits    Patient Active Problem List   Diagnosis Date Noted   Dementia arising in the senium and presenium (HCC) 06/24/2020   Toe fracture 02/22/2020   Displaced fracture of proximal phalanx of right lesser toe(s), initial encounter for closed fracture 07/30/2019   H/O stroke without residual deficits 03/03/2018   Prediabetes 08/01/2017   Anxiety state 12/26/2016   Common migraine 12/26/2016   GERD (gastroesophageal reflux disease) 12/26/2016   Health care maintenance 12/26/2016   HTN, goal below 140/90 12/26/2016   Hypercholesterolemia 12/26/2016   Degenerative spondylolisthesis 03/13/2016    Past Surgical History:  Procedure Laterality Date   AMPUTATION TOE  Right 04/13/2022   Procedure: 28820 - AMPUTATION TOE;  Surgeon: Gwyneth Revels, DPM;  Location: [facility] ORS;  Service: Podiatry;  Laterality: Right;   ANKLE ARTHROSCOPY Right 04/13/2022   Procedure: A-SCOPE/OCD REPAIR;  Surgeon: Gwyneth Revels, DPM;  Location: [facility] ORS;  Service: Podiatry;  Laterality: Right;   ANKLE SURGERY Right 1997   ankles/screws    ARTHROSCOPIC REPAIR ACL Right 1993   BACK SURGERY  2017   titanium rods and pins in lower back   COLONOSCOPY     EPIDIDYMECTOMY Left 04/10/2017   Procedure: EPIDIDYMECTOMY;  Surgeon: [name], MD;  Location: [facility] ORS;  Service: Urology;  Laterality: Left;   ESOPHAGOGASTRODUODENOSCOPY     LUMBAR FUSION  March 13, 2016   L4/L5 fusion   MINOR HARDWARE REMOVAL Right 04/13/2022   Procedure: REMOVAL SCREW; DEEP;  Surgeon: Gwyneth Revels, DPM;  Location: [facility] ORS;  Service: Podiatry;  Laterality: Right;   TONSILLECTOMY  1962   adenoidectomy    VASECTOMY  1973   WOUND DEBRIDEMENT Right 04/13/2022   Procedure: 29906 - SUBTALAR ARTHROSCOPY;  Surgeon: Gwyneth Revels, DPM;  Location: [facility] ORS;  Service: Podiatry;  Laterality: Right;       Home Medications    Prior to Admission medications   Medication  Sig Start Date End Date Taking? Authorizing Provider  acetaminophen (TYLENOL) 325 MG tablet Take 650 mg by mouth every 8 (eight) hours as needed for moderate pain.    [provider]  ALPRAZolam Prudy Feeler) 0.25 MG tablet Take 0.25 mg by mouth at bedtime as needed for anxiety. 05/01/19   [provider]  amLODipine (NORVASC) 10 MG tablet Take 10 mg by mouth daily. 08/27/18   [provider]  aspirin EC 81 MG tablet Take 81 mg by mouth daily.    [provider]  clopidogrel (PLAVIX) 75 MG tablet 75 mg daily. 07/29/19   [provider]  dibucaine (NUPERCAINAL) 1 % OINT Place 1 application. rectally 3 (three) times daily as needed for anal irritation. 05/14/22   Menshew, Charlesetta Ivory, PA-C  donepezil (ARICEPT)  5 MG tablet Take 5 mg by mouth at bedtime. 11/21/21   [provider]  DULoxetine (CYMBALTA) 60 MG capsule Take 60 mg by mouth daily. 02/25/19 04/13/22  [provider]  gabapentin (NEURONTIN) 100 MG capsule Take 1 capsule (100 mg total) by mouth 3 (three) times daily. 12/21/21   Candelaria Stagers, DPM  ibuprofen (ADVIL) 800 MG tablet TAKE 1 TABLET(800 MG) BY MOUTH EVERY 6 HOURS AS NEEDED 10/26/20   Candelaria Stagers, DPM  Multiple Vitamin (MULTIVITAMIN WITH MINERALS) TABS tablet Take 1 tablet by mouth daily.    [provider]  naphazoline-pheniramine (NAPHCON-A) 0.025-0.3 % ophthalmic solution Place 1 drop into both eyes 4 (four) times daily as needed for eye irritation.    [provider]  oxyCODONE-acetaminophen (PERCOCET) 5-325 MG tablet Take 1-2 tablets by mouth every 6 (six) hours as needed for severe pain. Max 6 tabs per day 04/13/22   Gwyneth Revels, DPM  rosuvastatin (CRESTOR) 20 MG tablet Take 20 mg by mouth daily. 07/29/19   [provider]  SUMAtriptan (IMITREX) 50 MG tablet Take 50 mg by mouth every 2 (two) hours as needed for migraine.    [provider]  trolamine salicylate (ASPERCREME) 10 % cream Apply 1 application topically as needed for muscle pain.    [provider]    Family History Family History  Problem Relation Age of Onset   Prostate cancer Neg Hx    Bladder Cancer Neg Hx    Kidney cancer Neg Hx     Social History Social History   Tobacco Use   Smoking status: Every Day    Packs/day: 0.50    Years: 50.00    Additional pack years: 0.00    Total pack years: 25.00    Types: Cigarettes   Smokeless tobacco: Never   Tobacco comments:    Half a pack a day, per wife.  Vaping Use   Vaping Use: Never used  Substance Use Topics   Alcohol use: No   Drug use: No     Allergies   Codeine   Review of Systems Review of Systems   Physical Exam Triage Vital Signs ED Triage Vitals  Enc Vitals Group     BP       Pulse      Resp      Temp      Temp src      SpO2      Weight      Height      Head Circumference      Peak Flow      Pain Score      Pain Loc  Pain Edu?      Excl. in GC?    No data found.  Updated Vital Signs There were no vitals taken for this visit.  Visual Acuity Right Eye Distance:   Left Eye Distance:   Bilateral Distance:    Right Eye Near:   Left Eye Near:    Bilateral Near:     Physical Exam Vitals reviewed.  Constitutional:      Appearance: Normal appearance.  Genitourinary:    Rectum: Tenderness present.    Skin:    General: Skin is warm and dry.  Neurological:     General: No focal deficit present.     Mental Status: He is alert and oriented to person, place, and time.  Psychiatric:        Mood and Affect: Mood normal.        Behavior: Behavior normal.      UC Treatments / Results  Labs (all labs ordered are listed, but only abnormal results are displayed) Labs Reviewed - No data to display  EKG   Radiology No results found.  Procedures Procedures (including critical care time)  Medications Ordered in UC Medications - No data to display  Initial Impression / Assessment and Plan / UC Course  I have reviewed the triage vital signs and the nursing notes.  Pertinent labs & imaging results that were available during my care of the patient were reviewed by me and considered in my medical decision making (see chart for details).   Albert [name] is a 77 y.o. male presenting with rectal pain. Patient is afebrile without recent antipyretics, satting well on room air. Overall is well appearing and non-toxic, well hydrated, without respiratory distress.  Erythema and tenderness with palpation at his rectum.  Rectal pruritus and possible internal hemorrhoid.  No external hemorrhoid is present.  Will treat again with Anusol and bupivacaine ointment and asked patient to follow-up with GI provider for evaluation.  Final Clinical  Impressions(s) / UC Diagnoses   Final diagnoses:  None   Discharge Instructions   None    ED Prescriptions   None    PDMP not reviewed this encounter.   Charma Igo, [place] 04/12/23 1816

## 2023-04-12 NOTE — ED Triage Notes (Addendum)
Patient presents to [facility] for possible hemorrhoids x 2 weeks. Flared up today. Reports some burning and itching to rectum. Pain with ambulation. Wife states about 6 months ago he was in the ED for external hemorrhoid. Using hydrocortisone cream and suppository with no relief.   Denies constipation or straining during BM.

## 2023-04-12 NOTE — Discharge Instructions (Addendum)
Recommend follow-up with your primary care or GI surgeon to evaluate the source of your discomfort, possible internal hemorrhoid.

## 2024-06-22 ENCOUNTER — Other Ambulatory Visit: Payer: Self-pay

## 2024-06-22 ENCOUNTER — Emergency Department

## 2024-06-22 ENCOUNTER — Emergency Department
Admission: EM | Admit: 2024-06-22 | Discharge: 2024-06-22 | Disposition: A | Discharge status: Home / Self Care | Attending: Emergency Medicine | Admitting: Emergency Medicine

## 2024-06-22 ENCOUNTER — Encounter: Payer: Self-pay | Admitting: Emergency Medicine

## 2024-06-22 DIAGNOSIS — M545 Low back pain, unspecified: Secondary | ICD-10-CM | POA: Diagnosis present

## 2024-06-22 DIAGNOSIS — R270 Ataxia, unspecified: Secondary | ICD-10-CM | POA: Diagnosis not present

## 2024-06-22 DIAGNOSIS — F0390 Unspecified dementia without behavioral disturbance: Secondary | ICD-10-CM | POA: Diagnosis not present

## 2024-06-22 DIAGNOSIS — I10 Essential (primary) hypertension: Secondary | ICD-10-CM | POA: Insufficient documentation

## 2024-06-22 LAB — URINALYSIS, ROUTINE W REFLEX MICROSCOPIC
Bilirubin Urine: NEGATIVE
Glucose, UA: NEGATIVE mg/dL
Hgb urine dipstick: NEGATIVE
Ketones, ur: NEGATIVE mg/dL
Leukocytes,Ua: NEGATIVE
Nitrite: NEGATIVE
Protein, ur: NEGATIVE mg/dL
Specific Gravity, Urine: 1.025 (ref 1.005–1.030)
pH: 5 (ref 5.0–8.0)

## 2024-06-22 LAB — COMPREHENSIVE METABOLIC PANEL WITH GFR
ALT: 26 U/L (ref 0–44)
AST: 23 U/L (ref 15–41)
Albumin: 3.4 g/dL — ABNORMAL LOW (ref 3.5–5.0)
Alkaline Phosphatase: 66 U/L (ref 38–126)
Anion gap: 10 (ref 5–15)
BUN: 23 mg/dL (ref 8–23)
CO2: 26 mmol/L (ref 22–32)
Calcium: 8.8 mg/dL — ABNORMAL LOW (ref 8.9–10.3)
Chloride: 105 mmol/L (ref 98–111)
Creatinine, Ser: 1.22 mg/dL (ref 0.61–1.24)
GFR, Estimated: >60 mL/min (ref >60)
Glucose, Bld: 89 mg/dL (ref 70–99)
Potassium: 3.6 mmol/L (ref 3.5–5.1)
Sodium: 141 mmol/L (ref 135–145)
Total Bilirubin: 0.5 mg/dL (ref 0.0–1.2)
Total Protein: 5.9 g/dL — ABNORMAL LOW (ref 6.5–8.1)

## 2024-06-22 LAB — CBC WITH DIFFERENTIAL/PLATELET
Abs Immature Granulocytes: 0.02 K/uL (ref 0.00–0.07)
Basophils Absolute: 0.1 K/uL (ref 0.0–0.1)
Basophils Relative: 1 %
Eosinophils Absolute: 0.3 K/uL (ref 0.0–0.5)
Eosinophils Relative: 4 %
HCT: 34.4 % — ABNORMAL LOW (ref 39.0–52.0)
Hemoglobin: 10.9 g/dL — ABNORMAL LOW (ref 13.0–17.0)
Immature Granulocytes: 0 %
Lymphocytes Relative: 34 %
Lymphs Abs: 2.3 K/uL (ref 0.7–4.0)
MCH: 30.5 pg (ref 26.0–34.0)
MCHC: 31.7 g/dL (ref 30.0–36.0)
MCV: 96.4 fL (ref 80.0–100.0)
Monocytes Absolute: 0.9 K/uL (ref 0.1–1.0)
Monocytes Relative: 13 %
Neutro Abs: 3.3 K/uL (ref 1.7–7.7)
Neutrophils Relative %: 48 %
Platelets: 276 K/uL (ref 150–400)
RBC: 3.57 MIL/uL — ABNORMAL LOW (ref 4.22–5.81)
RDW: 14.9 % (ref 11.5–15.5)
WBC: 6.8 K/uL (ref 4.0–10.5)
nRBC: 0 % (ref 0.0–0.2)

## 2024-06-22 LAB — TROPONIN I (HIGH SENSITIVITY): Troponin I (High Sensitivity): 11 ng/L (ref <18)

## 2024-06-22 MED ORDER — ACETAMINOPHEN 500 MG PO TABS
1000.0000 mg | ORAL_TABLET | Freq: Once | ORAL | Status: AC
Start: 2024-06-22 — End: 2024-06-22
  Administered 2024-06-22: 1000 mg via ORAL
  Filled 2024-06-22: qty 2

## 2024-06-22 MED ORDER — LIDOCAINE 5 % EX PTCH
1.0000 | MEDICATED_PATCH | CUTANEOUS | Status: DC
  Administered 2024-06-22: 1 via TRANSDERMAL
  Filled 2024-06-22: qty 1

## 2024-06-22 NOTE — ED Notes (Signed)
 Per Dr. Claudene,  pt does not need stroke work up at this time. Verbal order given for tylenol  and lidocaine  patch.

## 2024-06-22 NOTE — ED Triage Notes (Signed)
 Pt to ED via POV. Pt has hx/o dementia. Per pt wife, pt started having trouble with his balance around lunch time today and he is having numbness in his right leg and lower back pain. Pt states that he has pain in his his left leg. Pt is able to lift both legs and has normal sensation in his right leg but states that sensation in his left leg is less than the right. Pt does not have slurred speech and there is no facial droop noted.

## 2024-06-22 NOTE — ED Provider Notes (Signed)
 [facility] Provider Note    Event Date/Time   First MD Initiated Contact with Patient 06/22/24 1545     (approximate)   History   Back Pain and Leg Pain   HPI  Aly Hauser. is a 78 y.o. male with history of CVA, TIA, hypertension, chronic back pain, dementia and as listed in EMR presents to the emergency department for treatment and evaluation after wife noticed that he was off balance when going to lunch around 12:15 this afternoon.  She reports that he had to have help going in and out of the restaurant.  He is typically pretty stable when using his cane, but today the imbalance seem to last longer than usual.  He is complaining of some pain and tingling to the right lower extremity and low back however wife reports that this is chronic.  She is unaware of any recent injury or falls. Wife denies any cognitive changes today and has not noticed any slurred speech or facial droop. His appetite has been normal. He does not typically stay well hydrated, but has had no vomiting or diarrhea.     Physical Exam    Vitals:   06/22/24 1858 06/22/24 1927  BP: 118/88 (!) 164/90  Pulse: 88 74  Resp: 16 20  Temp: 98.5 F (36.9 C)   SpO2: 97% 98%    General: Awake, no distress.  CV:  Good peripheral perfusion.  Resp:  Normal effort.  Abd:  No distention. Soft. Nontender Other:  No focal neurologic abnormality on exam.    ED Results / Procedures / Treatments   Labs (all labs ordered are listed, but only abnormal results are displayed)  Labs Reviewed  URINALYSIS, ROUTINE W REFLEX MICROSCOPIC - Abnormal; Notable for the following components:      Result Value   Color, Urine YELLOW (*)    APPearance CLEAR (*)    All other components within normal limits  COMPREHENSIVE METABOLIC PANEL WITH GFR - Abnormal; Notable for the following components:   Calcium  8.8 (*)    Total Protein 5.9 (*)    Albumin 3.4 (*)    All other components within normal limits   CBC WITH DIFFERENTIAL/PLATELET - Abnormal; Notable for the following components:   RBC 3.57 (*)    Hemoglobin 10.9 (*)    HCT 34.4 (*)    All other components within normal limits  TROPONIN I (HIGH SENSITIVITY)     EKG  Normal sinus rhythm with a rate of 67.   RADIOLOGY  Image and radiology report reviewed and interpreted by me. Radiology report consistent with the same.  CT head is negative for acute findings.  PROCEDURES:  Critical Care performed: No  Procedures   MEDICATIONS ORDERED IN ED:  Medications  lidocaine  (LIDODERM ) 5 % 1 patch (1 patch Transdermal Patch Applied 06/22/24 1429)  acetaminophen  (TYLENOL ) tablet 1,000 mg (1,000 mg Oral Given 06/22/24 1432)     IMPRESSION / MDM / ASSESSMENT AND PLAN / ED COURSE   I have reviewed the triage note and vital signs. Vital signs stable.   Differential diagnosis includes, but is not limited to, TIA, CVA, worsening dementia, UTI,   Patient's presentation is most consistent with acute presentation with potential threat to life or bodily function.  78 year old male presenting to the emergency department for treatment and evaluation of being off balance since around 1215 today.  See HPI for further details.  Neuroexam is unremarkable.  There are no focal neurological deficits.  When questioned about the complaint of radiculopathy in the left leg, wife reports that this is chronic and ongoing and is unchanged today.   CBC shows a stable anemia 10.9/34.4.  BMP is essentially unremarkable.  Troponin is normal.  Urinalysis is clear.  CT of the head is negative for acute findings.  Patient is becoming restless and pulling off his cardiac leads and wants to leave.  He was encouraged to stay and helped back into the bed and given warm blankets and pillow.  He was complaining of his IV site hurting and IV was removed.  This seemed to help him calm down and he is now agreeable to stay.  CT of the head is negative for acute concerns.   I recommended that we proceed with an MRI to ensure that there is no stroke however he adamantly refused.  He does have dementia and his wife is in the room.  She does not want to stay because he will be upset and difficult.  I advised her that if he has had a CVA this may lead to his demise without further treatment..  She does not feel that there would be any intervention even if he did have a CVA and would prefer to leave.  She was advised to return with him immediately to the emergency department for any symptoms that changes, worsens, or for new symptoms of concern.      FINAL CLINICAL IMPRESSION(S) / ED DIAGNOSES   Final diagnoses:  Ataxia     Rx / DC Orders   ED Discharge Orders     None        Note:  This document was prepared using Dragon voice recognition software and may include unintentional dictation errors.   Herlinda Kirk NOVAK, FNP 06/22/24 2250    Willo Dunnings, MD 06/22/24 2340

## 2024-06-23 ENCOUNTER — Other Ambulatory Visit: Payer: Self-pay | Admitting: Physician Assistant

## 2024-06-23 ENCOUNTER — Encounter: Payer: Self-pay | Admitting: Internal Medicine

## 2024-06-23 ENCOUNTER — Observation Stay
Admission: EM | Admit: 2024-06-23 | Discharge: 2024-06-26 | Disposition: A | Discharge status: Home Health | Source: Ambulatory Visit | Attending: Internal Medicine | Admitting: Internal Medicine

## 2024-06-23 ENCOUNTER — Other Ambulatory Visit: Payer: Self-pay

## 2024-06-23 ENCOUNTER — Ambulatory Visit
Admission: RE | Admit: 2024-06-23 | Discharge: 2024-06-23 | Disposition: A | Discharge status: Home / Self Care | Source: Ambulatory Visit | Attending: Physician Assistant | Admitting: Physician Assistant

## 2024-06-23 DIAGNOSIS — R260 Ataxic gait: Secondary | ICD-10-CM | POA: Diagnosis present

## 2024-06-23 DIAGNOSIS — I639 Cerebral infarction, unspecified: Secondary | ICD-10-CM

## 2024-06-23 DIAGNOSIS — I6389 Other cerebral infarction: Secondary | ICD-10-CM | POA: Diagnosis not present

## 2024-06-23 DIAGNOSIS — E7800 Pure hypercholesterolemia, unspecified: Secondary | ICD-10-CM | POA: Diagnosis not present

## 2024-06-23 DIAGNOSIS — F419 Anxiety disorder, unspecified: Secondary | ICD-10-CM | POA: Insufficient documentation

## 2024-06-23 DIAGNOSIS — F0390 Unspecified dementia without behavioral disturbance: Secondary | ICD-10-CM | POA: Diagnosis present

## 2024-06-23 DIAGNOSIS — R270 Ataxia, unspecified: Secondary | ICD-10-CM

## 2024-06-23 DIAGNOSIS — Z7902 Long term (current) use of antithrombotics/antiplatelets: Secondary | ICD-10-CM | POA: Insufficient documentation

## 2024-06-23 DIAGNOSIS — K219 Gastro-esophageal reflux disease without esophagitis: Secondary | ICD-10-CM | POA: Diagnosis not present

## 2024-06-23 DIAGNOSIS — Z7982 Long term (current) use of aspirin: Secondary | ICD-10-CM | POA: Diagnosis not present

## 2024-06-23 DIAGNOSIS — Z79899 Other long term (current) drug therapy: Secondary | ICD-10-CM | POA: Diagnosis not present

## 2024-06-23 DIAGNOSIS — G309 Alzheimer's disease, unspecified: Secondary | ICD-10-CM | POA: Diagnosis present

## 2024-06-23 DIAGNOSIS — F17210 Nicotine dependence, cigarettes, uncomplicated: Secondary | ICD-10-CM | POA: Insufficient documentation

## 2024-06-23 DIAGNOSIS — R5383 Other fatigue: Secondary | ICD-10-CM | POA: Diagnosis not present

## 2024-06-23 DIAGNOSIS — I6381 Other cerebral infarction due to occlusion or stenosis of small artery: Secondary | ICD-10-CM | POA: Diagnosis present

## 2024-06-23 DIAGNOSIS — R41841 Cognitive communication deficit: Secondary | ICD-10-CM | POA: Insufficient documentation

## 2024-06-23 DIAGNOSIS — I10 Essential (primary) hypertension: Secondary | ICD-10-CM | POA: Insufficient documentation

## 2024-06-23 DIAGNOSIS — R531 Weakness: Secondary | ICD-10-CM | POA: Diagnosis not present

## 2024-06-23 DIAGNOSIS — Z8673 Personal history of transient ischemic attack (TIA), and cerebral infarction without residual deficits: Secondary | ICD-10-CM

## 2024-06-23 DIAGNOSIS — J439 Emphysema, unspecified: Secondary | ICD-10-CM | POA: Diagnosis not present

## 2024-06-23 DIAGNOSIS — E785 Hyperlipidemia, unspecified: Secondary | ICD-10-CM | POA: Diagnosis present

## 2024-06-23 LAB — COMPREHENSIVE METABOLIC PANEL WITH GFR
ALT: 23 U/L (ref 0–44)
AST: 21 U/L (ref 15–41)
Albumin: 3.8 g/dL (ref 3.5–5.0)
Alkaline Phosphatase: 78 U/L (ref 38–126)
Anion gap: 10 (ref 5–15)
BUN: 24 mg/dL — ABNORMAL HIGH (ref 8–23)
CO2: 22 mmol/L (ref 22–32)
Calcium: 9.1 mg/dL (ref 8.9–10.3)
Chloride: 109 mmol/L (ref 98–111)
Creatinine, Ser: 0.96 mg/dL (ref 0.61–1.24)
GFR, Estimated: >60 mL/min (ref >60)
Glucose, Bld: 105 mg/dL — ABNORMAL HIGH (ref 70–99)
Potassium: 3.7 mmol/L (ref 3.5–5.1)
Sodium: 141 mmol/L (ref 135–145)
Total Bilirubin: 0.4 mg/dL (ref 0.0–1.2)
Total Protein: 6.5 g/dL (ref 6.5–8.1)

## 2024-06-23 LAB — CBC WITH DIFFERENTIAL/PLATELET
Abs Immature Granulocytes: 0.02 K/uL (ref 0.00–0.07)
Basophils Absolute: 0 K/uL (ref 0.0–0.1)
Basophils Relative: 1 %
Eosinophils Absolute: 0.2 K/uL (ref 0.0–0.5)
Eosinophils Relative: 3 %
HCT: 35.4 % — ABNORMAL LOW (ref 39.0–52.0)
Hemoglobin: 11.3 g/dL — ABNORMAL LOW (ref 13.0–17.0)
Immature Granulocytes: 0 %
Lymphocytes Relative: 33 %
Lymphs Abs: 2.1 K/uL (ref 0.7–4.0)
MCH: 30.4 pg (ref 26.0–34.0)
MCHC: 31.9 g/dL (ref 30.0–36.0)
MCV: 95.2 fL (ref 80.0–100.0)
Monocytes Absolute: 0.7 K/uL (ref 0.1–1.0)
Monocytes Relative: 11 %
Neutro Abs: 3.3 K/uL (ref 1.7–7.7)
Neutrophils Relative %: 52 %
Platelets: 274 K/uL (ref 150–400)
RBC: 3.72 MIL/uL — ABNORMAL LOW (ref 4.22–5.81)
RDW: 14.9 % (ref 11.5–15.5)
WBC: 6.4 K/uL (ref 4.0–10.5)
nRBC: 0 % (ref 0.0–0.2)

## 2024-06-23 LAB — APTT: aPTT: 31 s (ref 24–36)

## 2024-06-23 LAB — PROTIME-INR
INR: 1.1 (ref 0.8–1.2)
Prothrombin Time: 14.4 s (ref 11.4–15.2)

## 2024-06-23 MED ORDER — ACETAMINOPHEN 650 MG RE SUPP: 650.0000 mg | RECTAL | Status: DC | PRN

## 2024-06-23 MED ORDER — GADOBUTROL 1 MMOL/ML IV SOLN
9.0000 mL | Freq: Once | INTRAVENOUS | Status: AC | PRN
  Administered 2024-06-23: 9 mL via INTRAVENOUS

## 2024-06-23 MED ORDER — STROKE: EARLY STAGES OF RECOVERY BOOK: Freq: Once | Status: AC

## 2024-06-23 MED ORDER — LORAZEPAM 2 MG/ML IJ SOLN
INTRAMUSCULAR | Status: AC
  Filled 2024-06-23: qty 1

## 2024-06-23 MED ORDER — ASPIRIN 81 MG PO TBEC
81.0000 mg | DELAYED_RELEASE_TABLET | Freq: Every day | ORAL | Status: DC
  Administered 2024-06-24 – 2024-06-26 (×3): 81 mg via ORAL
  Filled 2024-06-23 (×3): qty 1

## 2024-06-23 MED ORDER — ROSUVASTATIN CALCIUM 20 MG PO TABS
20.0000 mg | ORAL_TABLET | Freq: Every day | ORAL | Status: DC
  Administered 2024-06-24 – 2024-06-25 (×2): 20 mg via ORAL
  Filled 2024-06-23 (×2): qty 1

## 2024-06-23 MED ORDER — TRAZODONE HCL 50 MG PO TABS
50.0000 mg | ORAL_TABLET | Freq: Every evening | ORAL | Status: DC | PRN
  Administered 2024-06-23 – 2024-06-25 (×2): 50 mg via ORAL
  Filled 2024-06-23 (×2): qty 1

## 2024-06-23 MED ORDER — DONEPEZIL HCL 5 MG PO TABS
10.0000 mg | ORAL_TABLET | Freq: Every day | ORAL | Status: DC
  Administered 2024-06-23 – 2024-06-25 (×3): 10 mg via ORAL
  Filled 2024-06-23 (×3): qty 2

## 2024-06-23 MED ORDER — AMLODIPINE BESYLATE 10 MG PO TABS: 10.0000 mg | ORAL_TABLET | Freq: Every day | ORAL | Status: DC

## 2024-06-23 MED ORDER — CLOPIDOGREL BISULFATE 75 MG PO TABS: 75.0000 mg | ORAL_TABLET | Freq: Every day | ORAL | Status: DC

## 2024-06-23 MED ORDER — QUETIAPINE FUMARATE 25 MG PO TABS
25.0000 mg | ORAL_TABLET | Freq: Every day | ORAL | Status: DC
  Administered 2024-06-23 – 2024-06-24 (×2): 25 mg via ORAL
  Filled 2024-06-23 (×2): qty 1

## 2024-06-23 MED ORDER — ACETAMINOPHEN 160 MG/5ML PO SOLN: 650.0000 mg | ORAL | Status: DC | PRN

## 2024-06-23 MED ORDER — ACETAMINOPHEN 325 MG PO TABS: 650.0000 mg | ORAL_TABLET | ORAL | Status: DC | PRN

## 2024-06-23 MED ORDER — DULOXETINE HCL 30 MG PO CPEP
60.0000 mg | ORAL_CAPSULE | Freq: Every day | ORAL | Status: DC
  Administered 2024-06-24 – 2024-06-26 (×3): 60 mg via ORAL
  Filled 2024-06-23 (×3): qty 2

## 2024-06-23 MED ORDER — LORAZEPAM 2 MG/ML IJ SOLN
1.0000 mg | INTRAMUSCULAR | Status: AC | PRN
  Administered 2024-06-24 (×2): 1 mg via INTRAVENOUS
  Filled 2024-06-23 (×2): qty 1

## 2024-06-23 MED ORDER — FERROUS SULFATE 325 (65 FE) MG PO TABS
325.0000 mg | ORAL_TABLET | Freq: Every day | ORAL | Status: DC
  Administered 2024-06-24 – 2024-06-26 (×3): 325 mg via ORAL
  Filled 2024-06-23 (×3): qty 1

## 2024-06-23 MED ORDER — ATORVASTATIN CALCIUM 20 MG PO TABS: 40.0000 mg | ORAL_TABLET | Freq: Every day | ORAL | Status: DC

## 2024-06-23 MED ORDER — NICOTINE 21 MG/24HR TD PT24
21.0000 mg | MEDICATED_PATCH | Freq: Every day | TRANSDERMAL | Status: DC
  Administered 2024-06-23 – 2024-06-26 (×4): 21 mg via TRANSDERMAL
  Filled 2024-06-23 (×4): qty 1

## 2024-06-23 MED ORDER — ADULT MULTIVITAMIN W/MINERALS CH
1.0000 | ORAL_TABLET | Freq: Every day | ORAL | Status: DC
  Administered 2024-06-24 – 2024-06-26 (×3): 1 via ORAL
  Filled 2024-06-23 (×3): qty 1

## 2024-06-23 MED ORDER — HALOPERIDOL LACTATE 5 MG/ML IJ SOLN: 2.5000 mg | Freq: Four times a day (QID) | INTRAMUSCULAR | Status: DC | PRN

## 2024-06-23 MED ORDER — SENNOSIDES-DOCUSATE SODIUM 8.6-50 MG PO TABS: 1.0000 | ORAL_TABLET | Freq: Every evening | ORAL | Status: DC | PRN

## 2024-06-23 MED ORDER — CLOPIDOGREL BISULFATE 75 MG PO TABS
75.0000 mg | ORAL_TABLET | Freq: Every day | ORAL | Status: DC
  Administered 2024-06-24 – 2024-06-26 (×3): 75 mg via ORAL
  Filled 2024-06-23 (×3): qty 1

## 2024-06-23 MED ORDER — VITAMIN B-12 1000 MCG PO TABS
1000.0000 ug | ORAL_TABLET | Freq: Every day | ORAL | Status: DC
  Administered 2024-06-24 – 2024-06-26 (×3): 1000 ug via ORAL
  Filled 2024-06-23 (×3): qty 1

## 2024-06-23 MED ORDER — HYDRALAZINE HCL 20 MG/ML IJ SOLN: 5.0000 mg | Freq: Four times a day (QID) | INTRAMUSCULAR | Status: DC | PRN

## 2024-06-23 MED ORDER — ASPIRIN 81 MG PO TBEC: 81.0000 mg | DELAYED_RELEASE_TABLET | Freq: Every day | ORAL | Status: DC

## 2024-06-23 NOTE — Assessment & Plan Note (Signed)
Home amlodipine 10 mg daily resumed

## 2024-06-23 NOTE — Assessment & Plan Note (Addendum)
 Right thalamus lacunar infarct Neurology has been consulted and we appreciate further recommendations Complete echo Fasting lipid and A1c ordered Frequent neuro vascular checks N.p.o. pending swallow study PT, OT, SLP Fall precaution

## 2024-06-23 NOTE — Assessment & Plan Note (Signed)
 Patient on high intensity rosuvastatin  20 mg nightly, resumed on admission

## 2024-06-23 NOTE — ED Triage Notes (Signed)
 Pt brought in yesterday by wife with concerns of stroke. CT scan yesterday showed remote infarct in right cerebellum. MRI done today shows acute lacunar infarcts within the right thalamus. PA from pcp office recommended coming in for admission. Wife reports sx are some better but is still having problems with balance.

## 2024-06-23 NOTE — Assessment & Plan Note (Signed)
 Home duloxetine 60 mg daily resumed

## 2024-06-23 NOTE — Progress Notes (Signed)
 No chief complaint on file.   HPI  Albert [name] is a 78 y.o. here for an acute issue.  He has a PMH of HTN, HLD, CVA, hyperglycemia, anxiety/depression and dementia who went to the ER yesterday after having acute balance changes.  CT report showing no acute findings but does also add remote infarct to the cerebellum.  Patient returns with his wife today because of persistent weakness, high risk for falls.  No significant change in mental status.  He does have underlying dementia.  Patient also recognizes that his balance has changed.  Stable labs in the ER yesterday.  Blood pressure stable.  He does take aspirin  and Plavix  nightly.  He also has chronic back pain with history of back surgery.    ROS  Pertinent items are noted in HPI.  Outpatient Encounter Medications as of 06/23/2024  Medication Sig Dispense Refill  . acetaminophen  (TYLENOL ) 325 MG tablet Take 325 mg by mouth 2 (two) times daily    . amLODIPine  (NORVASC ) 10 MG tablet Take 1 tablet (10 mg total) by mouth once daily 90 tablet 3  . aspirin  81 MG EC tablet Take 1 tablet (81 mg total) by mouth once daily    . clopidogreL  (PLAVIX ) 75 mg tablet TAKE 1 TABLET(75 MG) BY MOUTH EVERY DAY 90 tablet 1  . cyanocobalamin  (VITAMIN B12) 1000 MCG tablet Take 1,000 mcg by mouth once daily    . dibucaine 1 % ointment Place rectally    . donepeziL  (ARICEPT ) 10 MG tablet TAKE 1 TABLET(10 MG) BY MOUTH AT BEDTIME 90 tablet 0  . DULoxetine  (CYMBALTA ) 60 MG DR capsule Take 1 capsule (60 mg total) by mouth once daily 90 capsule 3  . ferrous sulfate  325 (65 FE) MG tablet Take 325 mg by mouth daily with breakfast    . hydrocortisone  (ANUSOL -HC) 25 mg suppository Place 25 mg rectally 2 (two) times daily    . ibuprofen  (MOTRIN ) 800 MG tablet Take 800 mg by mouth every 6 (six) hours as needed for Pain    . lidocaine  (LMX) 4 % cream Apply topically    . mv-min/folic/K1/lycopen/lutein (CENTRUM SILVER MEN ORAL) Take 1 tablet by mouth once daily    .  naphazoline-pheniramine (ALLERGY EYE, NAPHAZOLINE-PHEN,) 0.025-0.3 % ophthalmic solution Place 1 drop into both eyes 4 (four) times daily    . QUEtiapine  (SEROQUEL ) 25 MG tablet Take 1 tablet (25 mg total) by mouth at bedtime 90 tablet 3  . rosuvastatin  (CRESTOR ) 20 MG tablet TAKE 1 TABLET(20 MG) BY MOUTH EVERY DAY 30 tablet 0  . SUMAtriptan  (IMITREX ) 50 MG tablet TAKE 1 TABLET (50 MG TOTAL) BY MOUTH EVERY 2 (TWO) HOURS 10 tablet 1  . trolamine salicylate (PAIN RELIEF, TROLAMINE SALICY,) 10 % cream Apply 1 Application topically as needed     No facility-administered encounter medications on file as of 06/23/2024.    Allergies as of 06/23/2024 - Reviewed 06/15/2024  Allergen Reaction Noted  . Codeine Nausea And Vomiting and Other (See Comments) 07/26/2015    Past Medical History:  Diagnosis Date  . Anxiety attack   . Arthritis   . Barrett's esophagus   . Diverticulitis   . GERD (gastroesophageal reflux disease)   . History of stroke March 2019   MRI revealed several mini strokes had occurred  . Hx of migraines   . Hyperlipidemia   . Hypertension 02/2016  . Insomnia   . Kidney stone 2010  . Peptic ulcer disease   . Tobacco abuse  Past Surgical History:  Procedure Laterality Date  . back lumbar fusion  03/13/2016   L4/L5  . ankle surgery    . COLONOSCOPY    . ESOPHAGOGASTRODOUDENOSCOPY    . FRACTURE SURGERY  April 1997   to repair broken ankle  . KNEE ARTHROSCOPY    . right ankle surgery     has metal screws  . right knee     . TONSILLECTOMY    . VASECTOMY      There were no vitals filed for this visit.  Physical Exam  General. Well appearing; NAD; VS reviewed     HEENT: Sclera and conjunctiva clear; EOMI, Ears:  Clear with nml landmarks.  Orapharynx: uvula midline.  No erythema.  No lesions.  Nml dentition. Neck. Supple. No thyromegaly, lymphadenopathy. Lungs. Respirations unlabored; clear to auscultation bilaterally. Cardiovascular. Heart regular rate and  rhythm without murmurs, gallops, or rubs. Abdomen:  Soft, non tender.  Normoactive bowel sounds.   Extremities:  No edema. Skin. Normal color and turgor Neurologic. Alert and oriented; cerebellar function with her to nose testing is asymmetric with left sided wandering noted.  Assessment and Plan 1. Ataxia 78 year old with above PMH who went to the emergency room yesterday with acute changes in balance.  Apparently declined MRI at that time.  Agreeable to stat MRI today.  Some cerebellar dysfunction noted with the left upper extremity.  CT yesterday in the ER showing small remote cerebellar infarct.  The patient denies dizziness, headache, difficulty swallowing or speaking. -     MRI brain with and without contrast; Future  2. Cerebellar infarct (CMS/HHS-HCC) Continue with aspirin , Plavix . -     MRI brain with and without contrast; Future  3. HTN, goal below 140/90 Controlled.  4. Dementia arising in the senium and presenium (CMS/HHS-HCC)   5. H/O stroke without residual deficits   6. Chronic bilateral low back pain without sciatica Continue Tylenol .  Toradol  today to help with getting through MRI. -     ketorolac  (TORADOL ) injection 30 mg    I have personally performed this service.  [address] [place], [place]

## 2024-06-23 NOTE — ED Notes (Signed)
 Pt confused and attempting to get out of bed. Pt able to be redirected. Bed alarm set.

## 2024-06-23 NOTE — H&P (Addendum)
 History and Physical   Albert [name] FMW:[id number] DOB: Sep 21, 1946 DOA: 06/23/2024  PCP: Albert [name] ORN, MD  Outpatient Specialists: Dr. [name] Patient coming from: home  I have personally briefly reviewed patient's old medical records in [facility] EMR.  Chief Concern: stroke  HPI: Albert [name], [name] 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  Vitals in the ED showed T of 98.7, rr of 18, hr 88, blood pressure 131/89, SpO2 94% on room air.  Serum sodium is 141, potassium 3.7, chloride 109, bicarb 22, BUN of 24, serum creatinine 0.96, EGFR greater than 60, nonfasting blood glucose 105, WBC 6.4, hemoglobin 11.3, platelets of 274.  Outpatient MRI brain w wo contrast: Read as acute lacunar infarct within the right thalamus.  ED treatment: None ------------------------------------------ At bedside, patient is able to tell me his first and last name and the name that he goes by which is patent.  He was not able to tell me his age.  He states his age is in the 73s.  Initially he states he is in the 50s.  He was not able to tell me his current location, or the current calendar year.  He is able to identify his wife at bedside.  Spouse states that the symptoms started yesterday noon.  She noticed that he had difficulty walking.  Patient denies being in pain at this time.  Social history: He lives at home with his wife.  ROS: unable to complete due to patient with moderate to advanced dementia  ED Course: Discussed with EDP, patient requiring hospitalization for chief concerns of stroke.  Assessment/Plan  Principal Problem:   Acute lacunar stroke [facility]) Active Problems:   Anxiety   Dementia arising in the senium and presenium  (HCC)   GERD (gastroesophageal reflux disease)   H/O stroke without residual deficits   HTN, goal below 140/90   Hypercholesterolemia   Assessment and Plan:  * Acute lacunar stroke (HCC) Right thalamus lacunar infarct Neurology has been consulted and we appreciate further recommendations Complete echo Fasting lipid and A1c ordered Frequent neuro vascular checks N.p.o. pending swallow study PT, OT, SLP Fall precaution  Hypercholesterolemia Patient on high intensity rosuvastatin  20 mg nightly, resumed on admission  HTN, goal below 140/90 Home amlodipine  10 mg daily resumed  Dementia arising in the senium and presenium (HCC) Home donepezil  10 mg nightly resumed  Anxiety Home duloxetine  60 mg daily resumed  Chart reviewed.   DVT prophylaxis: None at this time. Pharmacologic DVT not initiated on admission.  AM team to initiate pharmacologic DVT when the benefits outweigh the risk. Code Status: Full code, confirmed with spouse at bedside Diet: Heart healthy/carb modified Family Communication: Updated spouse, Albert [name] at bedside Disposition Plan: Pending clinical course Consults called: Neurology Admission status: Telemetry medical, observation  Past Medical History:  Diagnosis Date   Anxiety    takes Xanax  daily as needed   Arthritis    knees   Back pain    Barrett esophagus    Chronic back pain    Dementia (HCC)    arising in the senium and presenium   Displaced fracture of proximal phalanx of right lesser toe(s), initial encounter for closed fracture    Diverticulosis    GERD (gastroesophageal reflux disease)    OTC meds as  needed   Headache    History of colon polyps    benign    History of kidney stones    History of migraine    takes Imitrex  daily as needed.Last one 2 wks ago   Hyperlipidemia    hx of-has been off of meds for about a yr   Hypertension    Insomnia    only recent d/t pain   Joint pain    Nephrolithiasis    Pre-diabetes    Stroke [facility])     without residual deficits   Past Surgical History:  Procedure Laterality Date   AMPUTATION TOE Right 04/13/2022   Procedure: 28820 - AMPUTATION TOE;  Surgeon: Ashley Soulier, DPM;  Location: [facility] ORS;  Service: Podiatry;  Laterality: Right;   ANKLE ARTHROSCOPY Right 04/13/2022   Procedure: A-SCOPE/OCD REPAIR;  Surgeon: Ashley Soulier, DPM;  Location: [facility] ORS;  Service: Podiatry;  Laterality: Right;   ANKLE SURGERY Right 1997   ankles/screws    ARTHROSCOPIC REPAIR ACL Right 1993   BACK SURGERY  2017   titanium rods and pins in lower back   COLONOSCOPY     EPIDIDYMECTOMY Left 04/10/2017   Procedure: EPIDIDYMECTOMY;  Surgeon: Rosina Riis, MD;  Location: [facility] ORS;  Service: Urology;  Laterality: Left;   ESOPHAGOGASTRODUODENOSCOPY     LUMBAR FUSION  March 13, 2016   L4/L5 fusion   MINOR HARDWARE REMOVAL Right 04/13/2022   Procedure: REMOVAL SCREW; DEEP;  Surgeon: Ashley Soulier, DPM;  Location: [facility] ORS;  Service: Podiatry;  Laterality: Right;   TONSILLECTOMY  1962   adenoidectomy    VASECTOMY  1973   WOUND DEBRIDEMENT Right 04/13/2022   Procedure: 29906 - SUBTALAR ARTHROSCOPY;  Surgeon: Ashley Soulier, DPM;  Location: [facility] ORS;  Service: Podiatry;  Laterality: Right;   Social History:  reports that he has been smoking cigarettes. He has a 25 pack-year smoking history. He has never used smokeless tobacco. He reports that he does not drink alcohol and does not use drugs.  Allergies  Allergen Reactions   Codeine Nausea And Vomiting   Family History  Problem Relation Age of Onset   Arthritis Mother    Esophageal cancer Father    Prostate cancer Neg Hx    Bladder Cancer Neg Hx    Kidney cancer Neg Hx    Family history: Family history reviewed and not pertinent.  Prior to Admission medications   Medication Sig Start Date End Date Taking? Authorizing Provider  acetaminophen  (TYLENOL ) 325 MG tablet Take 650 mg by mouth every 8 (eight) hours as needed for moderate pain.   Yes  [provider]  ALPRAZolam  (XANAX ) 0.25 MG tablet Take 0.25 mg by mouth at bedtime as needed for anxiety. 05/01/19  Yes [provider]  amLODipine  (NORVASC ) 10 MG tablet Take 10 mg by mouth daily. 08/27/18  Yes [provider]  aspirin  EC 81 MG tablet Take 81 mg by mouth daily.   Yes [provider]  clopidogrel  (PLAVIX ) 75 MG tablet 75 mg daily. 07/29/19  Yes [provider]  cyanocobalamin  (VITAMIN B12) 1000 MCG tablet Take 1,000 mcg by mouth.  Take 1,000 mcg by mouth once daily   Yes [provider]  donepezil  (ARICEPT ) 10 MG tablet Take 10 mg by mouth.  TAKE 1 TABLET(10 MG) BY MOUTH AT BEDTIME 04/23/24  Yes [provider]  DULoxetine  (CYMBALTA ) 60 MG capsule Take 60 mg by mouth daily. 02/25/19 06/23/24 Yes [provider]  ferrous sulfate  325 (65 FE) MG  tablet Take 325 mg by mouth.  Take 325 mg by mouth daily with breakfast   Yes [provider]  ibuprofen  (ADVIL ) 800 MG tablet TAKE 1 TABLET(800 MG) BY MOUTH EVERY 6 HOURS AS NEEDED 10/26/20  Yes Tobie Franky SQUIBB, DPM  Multiple Vitamin (MULTIVITAMIN WITH MINERALS) TABS tablet Take 1 tablet by mouth daily.   Yes [provider]  naphazoline-pheniramine (NAPHCON-A) 0.025-0.3 % ophthalmic solution Place 1 drop into both eyes 4 (four) times daily as needed for eye irritation.   Yes [provider]  oxyCODONE -acetaminophen  (PERCOCET) 5-325 MG tablet Take 1-2 tablets by mouth every 6 (six) hours as needed for severe pain. Max 6 tabs per day 04/13/22  Yes Ashley Soulier, DPM  QUEtiapine  (SEROQUEL ) 25 MG tablet Take 25 mg by mouth at bedtime. 06/15/24  Yes [provider]  rosuvastatin  (CRESTOR ) 20 MG tablet Take 20 mg by mouth daily. 07/29/19  Yes [provider]  trolamine salicylate (ASPERCREME) 10 % cream Apply 1 application topically as needed for muscle pain.   Yes [provider]  dibucaine (NUPERCAINAL) 1 % OINT Place 1 Application  rectally 3 (three) times daily as needed for anal irritation. Patient not taking: Reported on 06/23/2024 04/12/23   Immordino, Garnette, FNP  donepezil  (ARICEPT ) 5 MG tablet Take 5 mg by mouth at bedtime. 11/21/21   [provider]  gabapentin  (NEURONTIN ) 100 MG capsule Take 1 capsule (100 mg total) by mouth 3 (three) times daily. Patient not taking: Reported on 06/23/2024 12/21/21   Tobie Franky SQUIBB, DPM  hydrocortisone  (ANUSOL -HC) 25 MG suppository Place 1 suppository (25 mg total) rectally 2 (two) times daily. Patient not taking: Reported on 06/23/2024 04/12/23   Immordino, Garnette, FNP  SUMAtriptan  (IMITREX ) 50 MG tablet Take 50 mg by mouth every 2 (two) hours as needed for migraine. Patient not taking: Reported on 06/23/2024    [provider]   Physical Exam: Vitals:   06/23/24 1532 06/23/24 1900  BP: 131/89 124/80  Pulse: 88   Resp: 18 20  Temp: 98.7 F (37.1 C)   TempSrc: Oral   SpO2: 94%   Weight: 89.4 kg   Height: 6' 1 (1.854 m)    Constitutional: appears age-appropriate, frail, NAD, calm Eyes: PERRL, lids and conjunctivae normal ENMT: Mucous membranes are moist. Posterior pharynx clear of any exudate or lesions. Age-appropriate dentition. Hearing appropriate Neck: normal, supple, no masses, no thyromegaly Respiratory: clear to auscultation bilaterally, no wheezing, no crackles. Normal respiratory effort. No accessory muscle use.  Cardiovascular: Regular rate and rhythm, no murmurs / rubs / gallops. No extremity edema. 2+ pedal pulses. No carotid bruits.  Abdomen: no tenderness, no masses palpated, no hepatosplenomegaly. Bowel sounds positive.  Musculoskeletal: no clubbing / cyanosis. No joint deformity upper and lower extremities. Good ROM, no contractures, no atrophy. Normal muscle tone.  Skin: no rashes, lesions, ulcers. No induration Neurologic: Sensation intact. Strength 5/5 in all 4.  Psychiatric: Lacks judgment and insight consistent with dementia. Alert and  oriented x self. Normal mood.   EKG: independently reviewed, showing sinus rhythm with rate of 83, QTc 415  Chest x-ray on Admission: I personally reviewed and I agree with radiologist reading as below.  MR BRAIN W WO CONTRAST Addendum Date: 06/23/2024 ADDENDUM REPORT: 06/23/2024 15:12 ADDENDUM: These results were called by telephone at the time of interpretation on 06/23/2024 at 3:11 pm to provider PA LAMAR MANUS , who verbally acknowledged these results. Electronically Signed   By: Evalene Coho M.D.   On:  06/23/2024 15:12   Result Date: 06/23/2024 CLINICAL DATA:  Increased leg weakness.  Possible stroke. EXAM: MRI HEAD WITHOUT AND WITH CONTRAST TECHNIQUE: Multiplanar, multiecho pulse sequences of the brain and surrounding structures were obtained without and with intravenous contrast. CONTRAST:  9mL GADAVIST  GADOBUTROL  1 MMOL/ML IV SOLN COMPARISON:  CT of the head dated June 22, 2024 and MRI of the head dated February 13, 2018. FINDINGS: Brain: There is restricted diffusion present within the right thalamus representing acute nonhemorrhagic lacunar infarct. There is no restricted diffusion present elsewhere throughout the brain. There is age-related cerebral and cerebellar volume loss present. There is moderate diffuse cerebral white matter disease. There is no evidence of hemorrhage, mass or hydrocephalus. There is no abnormal parenchymal or meningeal enhancement. Vascular: Normal flow voids. Skull and upper cervical spine: Normal marrow signal. No osseous lesions. Sinuses/Orbits: Clear paranasal sinuses.  Normal orbits. Other: None. IMPRESSION: 1. Acute lacunar infarct within the right thalamus. 2. Age-related atrophy and moderate diffuse cerebral white matter disease. Electronically Signed: By: Evalene Coho M.D. On: 06/23/2024 14:39   CT Head Wo Contrast Result Date: 06/22/2024 CLINICAL DATA:  Transient ischemic attack. History of dementia. Difficulty with balance around lunchtime today also  with numbness in right leg and lower back pain. EXAM: CT HEAD WITHOUT CONTRAST TECHNIQUE: Contiguous axial images were obtained from the base of the skull through the vertex without intravenous contrast. RADIATION DOSE REDUCTION: This exam was performed according to the departmental dose-optimization program which includes automated exposure control, adjustment of the mA and/or kV according to patient size and/or use of iterative reconstruction technique. COMPARISON:  MRI head 02/13/2018. FINDINGS: Brain: No acute intracranial hemorrhage. No CT evidence of acute infarct. Nonspecific hypoattenuation in the periventricular and subcortical white matter favored to reflect chronic microvascular ischemic changes. Mild parenchymal volume loss with slight temporal lobe predominance. Small remote infarct in the right cerebellum. No edema, mass effect, or midline shift. The basilar cisterns are patent. Ventricles: The ventricles are normal. Vascular: No hyperdense vessel or unexpected calcification. Skull: No acute or aggressive finding. Orbits: Orbits are symmetric. Sinuses: Mucosal thickening in the alveolar recess of the left maxillary sinus which may be odontogenic in origin. Additional mild mucosal thickening in the left sphenoid sinus. Other: Mastoid air cells are clear. IMPRESSION: No CT evidence of acute intracranial abnormality. Chronic microvascular ischemic changes and mild parenchymal volume loss. Small remote infarct in the right cerebellum. Electronically Signed   By: Donnice Mania M.D.   On: 06/22/2024 18:42   Labs on Admission: I have personally reviewed following labs  CBC: Recent Labs  Lab 06/22/24 1603 06/23/24 1545  WBC 6.8 6.4  NEUTROABS 3.3 3.3  HGB 10.9* 11.3*  HCT 34.4* 35.4*  MCV 96.4 95.2  PLT 276 274   Basic Metabolic Panel: Recent Labs  Lab 06/22/24 1603 06/23/24 1545  NA 141 141  K 3.6 3.7  CL 105 109  CO2 26 22  GLUCOSE 89 105*  BUN 23 24*  CREATININE 1.22 0.96   CALCIUM  8.8* 9.1   GFR: Estimated Creatinine Clearance: 72.8 mL/min (by C-G formula based on SCr of 0.96 mg/dL).  Liver Function Tests: Recent Labs  Lab 06/22/24 1603 06/23/24 1545  AST 23 21  ALT 26 23  ALKPHOS 66 78  BILITOT 0.5 0.4  PROT 5.9* 6.5  ALBUMIN 3.4* 3.8   Coagulation Profile: Recent Labs  Lab 06/23/24 1545  INR 1.1   Urine analysis:    Component Value Date/Time   COLORURINE YELLOW (A)  06/22/2024 1725   APPEARANCEUR CLEAR (A) 06/22/2024 1725   APPEARANCEUR Hazy 09/05/2014 1506   LABSPEC 1.025 06/22/2024 1725   LABSPEC 1.030 09/05/2014 1506   PHURINE 5.0 06/22/2024 1725   GLUCOSEU NEGATIVE 06/22/2024 1725   GLUCOSEU Negative 09/05/2014 1506   HGBUR NEGATIVE 06/22/2024 1725   BILIRUBINUR NEGATIVE 06/22/2024 1725   BILIRUBINUR Negative 09/05/2014 1506   KETONESUR NEGATIVE 06/22/2024 1725   PROTEINUR NEGATIVE 06/22/2024 1725   NITRITE NEGATIVE 06/22/2024 1725   LEUKOCYTESUR NEGATIVE 06/22/2024 1725   LEUKOCYTESUR Negative 09/05/2014 1506   This document was prepared using Dragon Voice Recognition software and may include unintentional dictation errors.  Dr. Sherre [facility]  If 7PM-7AM, please contact overnight-coverage provider If 7AM-7PM, please contact day attending provider [email or web address]  06/23/2024, 8:03 PM

## 2024-06-23 NOTE — Assessment & Plan Note (Signed)
Home donepezil 10 mg nightly resumed

## 2024-06-23 NOTE — ED Provider Notes (Signed)
 [facility] Provider Note    Event Date/Time   First MD Initiated Contact with Patient 06/23/24 1652     (approximate)   History   Stroke Symptoms   HPI Albert [name] is a 78 y.o. male with history of CVA, TIA, HTN, dementia presenting today for stroke symptoms.  Patient seen in the ED yesterday for ataxia that started around noon yesterday.  No other obvious neurological findings that they are aware of.  While in the ED, he had CT head which was negative at that time but it was recommended he get an MRI given ongoing symptoms.  Patient left AMA.  Went and saw his PCP today who ordered an MRI and found an acute lacunar infarct and told to come back to the ED.  Wife reports maybe slight improvement in his ataxia but still dragging his left leg.  No other obvious neurological deficits that are new today.     Physical Exam   Triage Vital Signs: ED Triage Vitals [06/23/24 1532]  Encounter Vitals Group     BP 131/89     Girls Systolic BP Percentile      Girls Diastolic BP Percentile      Boys Systolic BP Percentile      Boys Diastolic BP Percentile      Pulse Rate 88     Resp 18     Temp 98.7 F (37.1 C)     Temp Source Oral     SpO2 94 %     Weight 197 lb 1.5 oz (89.4 kg)     Height 6' 1 (1.854 m)     Head Circumference      Peak Flow      Pain Score 0     Pain Loc      Pain Education      Exclude from Growth Chart     Most recent vital signs: Vitals:   06/23/24 1532  BP: 131/89  Pulse: 88  Resp: 18  Temp: 98.7 F (37.1 C)  SpO2: 94%   I have reviewed the vital signs. General:  Awake, alert, no acute distress.  Dementia with mild confusion and memory loss. Head:  Normocephalic, Atraumatic. EENT:  PERRL, EOMI, Oral mucosa pink and moist, Neck is supple. Cardiovascular: Regular rate, 2+ distal pulses. Respiratory:  Normal respiratory effort, symmetrical expansion, no distress.   Extremities:  Moving all four extremities through  full ROM without pain.   Neuro:  Alert and oriented.  No obvious dysarthria or other slurred speech or aphasia.  Slight drift on the left side extremities but does not completely fall to the bed.  No appreciable sensation deficits and cranial nerves II through XII appear intact as much as he is able to cooperate given his dementia. Skin:  Warm, dry, no rash.   Psych: Appropriate affect.    ED Results / Procedures / Treatments   Labs (all labs ordered are listed, but only abnormal results are displayed) Labs Reviewed  CBC WITH DIFFERENTIAL/PLATELET - Abnormal; Notable for the following components:      Result Value   RBC 3.72 (*)    Hemoglobin 11.3 (*)    HCT 35.4 (*)    All other components within normal limits  COMPREHENSIVE METABOLIC PANEL WITH GFR - Abnormal; Notable for the following components:   Glucose, Bld 105 (*)    BUN 24 (*)    All other components within normal limits  PROTIME-INR  APTT  URINE  DRUG SCREEN, QUALITATIVE ([facility] ONLY)  LIPID PANEL  HEMOGLOBIN A1C     EKG My EKG interpretation: Rate of 83, normal sinus rhythm, left axis deviation.  No acute ST elevations or depressions   RADIOLOGY    PROCEDURES:  Critical Care performed: Yes, see critical care procedure note(s)  Procedures   MEDICATIONS ORDERED IN ED: Medications  multivitamin with minerals tablet 1 tablet (has no administration in time range)   stroke: early stages of recovery book (has no administration in time range)  acetaminophen  (TYLENOL ) tablet 650 mg (has no administration in time range)    Or  acetaminophen  (TYLENOL ) 160 MG/5ML solution 650 mg (has no administration in time range)    Or  acetaminophen  (TYLENOL ) suppository 650 mg (has no administration in time range)  senna-docusate (Senokot-S) tablet 1 tablet (has no administration in time range)  clopidogrel  (PLAVIX ) tablet 75 mg (has no administration in time range)  atorvastatin  (LIPITOR) tablet 40 mg (has no administration in  time range)  aspirin  EC tablet 81 mg (has no administration in time range)  donepezil  (ARICEPT ) tablet 10 mg (has no administration in time range)  QUEtiapine  (SEROQUEL ) tablet 25 mg (has no administration in time range)     IMPRESSION / MDM / ASSESSMENT AND PLAN / ED COURSE  I reviewed the triage vital signs and the nursing notes.                              Differential diagnosis includes, but is not limited to, CVA  Patient's presentation is most consistent with acute presentation with potential threat to life or bodily function.  Patient is a 78 year old male presenting today after outpatient MRI performed earlier today showed acute lacunar infarct.  Has had ataxia for greater then 24 hours.  His exam at this time only notable from a neurological perspective of slight left-sided drift on his extremities.  Outside any window for TNK or thrombectomy.  Patient will be admitted to hospitalist for further care of acute CVA.  The patient is on the cardiac monitor to evaluate for evidence of arrhythmia and/or significant heart rate changes.     FINAL CLINICAL IMPRESSION(S) / ED DIAGNOSES   Final diagnoses:  Cerebrovascular accident (CVA), unspecified mechanism (HCC)  Ataxia     Rx / DC Orders   ED Discharge Orders     None        Note:  This document was prepared using Dragon voice recognition software and may include unintentional dictation errors.   Malvina Alm DASEN, MD 06/23/24 [phone]

## 2024-06-23 NOTE — Hospital Course (Addendum)
 [name] [name], [name] 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  Vitals in the ED showed T of 98.7, rr of 18, hr 88, blood pressure 131/89, SpO2 94% on room air.  Serum sodium is 141, potassium 3.7, chloride 109, bicarb 22, BUN of 24, serum creatinine 0.96, EGFR greater than 60, nonfasting blood glucose 105, WBC 6.4, hemoglobin 11.3, platelets of 274.  Outpatient MRI brain w wo contrast: Read as acute lacunar infarct within the right thalamus.  ED treatment: None

## 2024-06-24 ENCOUNTER — Observation Stay

## 2024-06-24 DIAGNOSIS — I739 Peripheral vascular disease, unspecified: Secondary | ICD-10-CM

## 2024-06-24 DIAGNOSIS — R29704 NIHSS score 4: Secondary | ICD-10-CM

## 2024-06-24 DIAGNOSIS — I6381 Other cerebral infarction due to occlusion or stenosis of small artery: Secondary | ICD-10-CM | POA: Diagnosis not present

## 2024-06-24 LAB — LIPID PANEL
Cholesterol: 117 mg/dL (ref 0–200)
HDL: 34 mg/dL — ABNORMAL LOW (ref >40)
LDL Cholesterol: 61 mg/dL (ref 0–99)
Total CHOL/HDL Ratio: 3.4 ratio
Triglycerides: 111 mg/dL (ref <150)
VLDL: 22 mg/dL (ref 0–40)

## 2024-06-24 LAB — IRON AND TIBC
Iron: 41 ug/dL — ABNORMAL LOW (ref 45–182)
Saturation Ratios: 12 % — ABNORMAL LOW (ref 17.9–39.5)
TIBC: 346 ug/dL (ref 250–450)
UIBC: 305 ug/dL

## 2024-06-24 LAB — VITAMIN B12: Vitamin B-12: 1783 pg/mL — ABNORMAL HIGH (ref 180–914)

## 2024-06-24 LAB — URINE DRUG SCREEN, QUALITATIVE (ARMC ONLY)
Amphetamines, Ur Screen: NOT DETECTED
Barbiturates, Ur Screen: NOT DETECTED
Benzodiazepine, Ur Scrn: NOT DETECTED
Cannabinoid 50 Ng, Ur ~~LOC~~: NOT DETECTED
Cocaine Metabolite,Ur ~~LOC~~: NOT DETECTED
MDMA (Ecstasy)Ur Screen: NOT DETECTED
Methadone Scn, Ur: NOT DETECTED
Opiate, Ur Screen: NOT DETECTED
Phencyclidine (PCP) Ur S: NOT DETECTED
Tricyclic, Ur Screen: POSITIVE — AB

## 2024-06-24 LAB — TSH: TSH: 1.627 u[IU]/mL (ref 0.350–4.500)

## 2024-06-24 LAB — HEMOGLOBIN A1C
Hgb A1c MFr Bld: 5.2 % (ref 4.8–5.6)
Mean Plasma Glucose: 103 mg/dL

## 2024-06-24 LAB — FOLATE: Folate: 35 ng/mL (ref >5.9)

## 2024-06-24 LAB — VITAMIN D 25 HYDROXY (VIT D DEFICIENCY, FRACTURES): Vit D, 25-Hydroxy: 36.51 ng/mL (ref 30–100)

## 2024-06-24 MED ORDER — ACETAMINOPHEN 325 MG PO TABS
650.0000 mg | ORAL_TABLET | Freq: Two times a day (BID) | ORAL | Status: DC
  Administered 2024-06-24 – 2024-06-26 (×5): 650 mg via ORAL
  Filled 2024-06-24 (×5): qty 2

## 2024-06-24 MED ORDER — TRAMADOL HCL 50 MG PO TABS
25.0000 mg | ORAL_TABLET | Freq: Two times a day (BID) | ORAL | Status: DC | PRN
  Administered 2024-06-24 – 2024-06-26 (×2): 25 mg via ORAL
  Filled 2024-06-24 (×3): qty 1

## 2024-06-24 MED ORDER — AMLODIPINE BESYLATE 10 MG PO TABS: 10.0000 mg | ORAL_TABLET | Freq: Every day | ORAL | Status: DC

## 2024-06-24 MED ORDER — IOHEXOL 350 MG/ML SOLN
75.0000 mL | Freq: Once | INTRAVENOUS | Status: AC | PRN
  Administered 2024-06-24: 75 mL via INTRAVENOUS

## 2024-06-24 MED ORDER — ENOXAPARIN SODIUM 40 MG/0.4ML IJ SOSY
40.0000 mg | PREFILLED_SYRINGE | Freq: Every evening | INTRAMUSCULAR | Status: DC
  Administered 2024-06-24 – 2024-06-25 (×2): 40 mg via SUBCUTANEOUS
  Filled 2024-06-24 (×2): qty 0.4

## 2024-06-24 MED ORDER — HYDRALAZINE HCL 20 MG/ML IJ SOLN: 10.0000 mg | Freq: Four times a day (QID) | INTRAMUSCULAR | Status: DC | PRN

## 2024-06-24 NOTE — Evaluation (Signed)
 Physical Therapy Evaluation Patient Details Name: Albert [name] MRN: [id number] DOB: Sep 07, 1946 Today's Date: 06/24/2024  History of Present Illness  [name] [name], Albert [name] 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 MRI brain w wo contrast: Read as acute lacunar infarct within the right thalamus.  Clinical Impression  Patient received in recliner, looking out window. Wife present in room to provide home information and prior mobility. Patient is pleasant and agreeable to PT/OT assessment. He stands from recliner with supervision. Ambulated 175 feet with single hand held A. Some unsteadiness at times, with patient reaching out for wall for support. He is at increased fall risk and will benefit from walker. No unilateral weakness noted with mobility. Patient does report some LE pain and back pain ( chronic) during session. Patient will continue to benefit from skilled PT to improve safety with mobility for return home.           If plan is discharge home, recommend the following: A little help with walking and/or transfers;A little help with bathing/dressing/bathroom;Help with stairs or ramp for entrance;Direct supervision/assist for financial management;Supervision due to cognitive status;Direct supervision/assist for medications management;Assistance with cooking/housework   Can travel by private vehicle    yes    Equipment Recommendations Rolling walker (2 wheels)  Recommendations for Other Services       Functional Status Assessment Patient has had a recent decline in their functional status and demonstrates the ability to make significant improvements in function in a reasonable and predictable amount of time.     Precautions / Restrictions Precautions Precautions:  Fall Recall of Precautions/Restrictions: Impaired Restrictions Weight Bearing Restrictions Per Provider Order: No      Mobility  Bed Mobility               General bed mobility comments: NT patient in recliner    Transfers Overall transfer level: Needs assistance Equipment used: None Transfers: Sit to/from Stand Sit to Stand: Supervision                Ambulation/Gait Ambulation/Gait assistance: Contact guard assist Gait Distance (Feet): 175 Feet Assistive device: 1 person hand held assist Gait Pattern/deviations: Step-through pattern, Decreased step length - right, Decreased step length - left, Decreased stride length, Drifts right/left Gait velocity: decr     General Gait Details: some mild instability at times, will benefit from [employer] Mobility     Tilt Bed    Modified Rankin (Stroke Patients Only)       Balance Overall balance assessment: Mild deficits observed, not formally tested, Needs assistance   Sitting balance-Leahy Scale: Normal     Standing balance support: Single extremity supported, During functional activity Standing balance-Leahy Scale: Fair                               Pertinent Vitals/Pain Pain Assessment Pain Assessment: PAINAD Breathing: normal Negative Vocalization: none Facial Expression: smiling or inexpressive Body Language: relaxed Consolability: no need to console PAINAD Score: 0 Pain Intervention(s): Monitored during session    Home Living Family/patient expects to be discharged to:: Private residence Living Arrangements: Spouse/significant other Available Help at Discharge: Family;Available 24 hours/day Type of Home: Apartment Home  Access: Level entry       Home Layout: One level Home Equipment: Cane - single point      Prior Function Prior Level of Function : Independent/Modified Independent             Mobility Comments: wife reports patient uses  cane when walking out to mailbox, otherwise no AD ADLs Comments: Per wife, patient does not get into shower much, she will assist with dressing, bathing, meal set up as needed.     Extremity/Trunk Assessment   Upper Extremity Assessment Upper Extremity Assessment: Defer to OT evaluation    Lower Extremity Assessment Lower Extremity Assessment: Generalized weakness    Cervical / Trunk Assessment Cervical / Trunk Assessment: Normal  Communication   Communication Communication: No apparent difficulties    Cognition Arousal: Alert Behavior During Therapy: WFL for tasks assessed/performed   PT - Cognitive impairments: History of cognitive impairments, Orientation, Awareness, Memory, Problem solving, Safety/Judgement   Orientation impairments: Place, Time, Situation                     Following commands: Intact       Cueing Cueing Techniques: Verbal cues     General Comments      Exercises     Assessment/Plan    PT Assessment Patient needs continued PT services  PT Problem List Decreased activity tolerance;Decreased balance;Decreased mobility;Decreased cognition;Decreased knowledge of use of DME;Decreased safety awareness       PT Treatment Interventions DME instruction;Gait training;Functional mobility training;Therapeutic activities;Therapeutic exercise;Balance training;Cognitive remediation;Patient/family education    PT Goals (Current goals can be found in the Care Plan section)  Acute Rehab PT Goals Patient Stated Goal: to return home PT Goal Formulation: With family Time For Goal Achievement: 07/08/24 Potential to Achieve Goals: Good    Frequency Min 2X/week     Co-evaluation PT/OT/SLP Co-Evaluation/Treatment: Yes Reason for Co-Treatment: Necessary to address cognition/behavior during functional activity;For patient/therapist safety;To address functional/ADL transfers PT goals addressed during session: Mobility/safety with mobility;Balance          AM-PAC PT 6 Clicks Mobility  Outcome Measure Help needed turning from your back to your side while in a flat bed without using bedrails?: None Help needed moving from lying on your back to sitting on the side of a flat bed without using bedrails?: None Help needed moving to and from a bed to a chair (including a wheelchair)?: A Little Help needed standing up from a chair using your arms (e.g., wheelchair or bedside chair)?: A Little Help needed to walk in hospital room?: A Little Help needed climbing 3-5 steps with a railing? : A Lot 6 Click Score: 19    End of Session   Activity Tolerance: Patient tolerated treatment well Patient left: in chair;with family/visitor present Nurse Communication: Mobility status PT Visit Diagnosis: Unsteadiness on feet (R26.81);Other abnormalities of gait and mobility (R26.89)    Time: 9041-8985 PT Time Calculation (min) (ACUTE ONLY): 16 min   Charges:   PT Evaluation $PT Eval Moderate Complexity: 1 Mod   PT General Charges $$ ACUTE PT VISIT: 1 Visit         Katena Petitjean, PT, GCS 06/24/24,10:47 AM

## 2024-06-24 NOTE — Evaluation (Addendum)
 Speech Language Pathology Evaluation Patient Details Name: Albert [name] 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) 06/24/2020   Toe fracture 02/22/2020   Displaced fracture of proximal phalanx of right lesser toe(s), initial encounter for closed fracture 07/30/2019   H/O stroke without residual deficits 03/03/2018   Prediabetes 08/01/2017   Anxiety 12/26/2016   Common migraine 12/26/2016   GERD (gastroesophageal reflux disease) 12/26/2016   Health care maintenance 12/26/2016   HTN, goal below 140/90 12/26/2016   Hypercholesterolemia 12/26/2016   Degenerative spondylolisthesis 03/13/2016   Past Medical History:  Past Medical History:  Diagnosis Date   Anxiety    takes Xanax  daily as needed   Arthritis    knees   Back pain    Barrett esophagus    Chronic back pain    Dementia (HCC)    arising in the senium and presenium   Displaced fracture of proximal phalanx of right lesser toe(s), initial encounter for closed fracture    Diverticulosis    GERD (gastroesophageal reflux disease)    OTC meds as needed   Headache    History of colon polyps    benign    History of kidney stones    History of migraine    takes Imitrex  daily as needed.Last one 2 wks ago   Hyperlipidemia    hx of-has been off of meds for about a yr   Hypertension    Insomnia    only recent d/t pain   Joint pain    Nephrolithiasis    Pre-diabetes    Stroke [facility])    without residual deficits   Past Surgical History:  Past Surgical History:  Procedure Laterality Date   AMPUTATION TOE Right 04/13/2022   Procedure: 28820 - AMPUTATION TOE;  Surgeon: Ashley Soulier, DPM;  Location: [facility] ORS;  Service: Podiatry;  Laterality: Right;   ANKLE ARTHROSCOPY Right 04/13/2022    Procedure: A-SCOPE/OCD REPAIR;  Surgeon: Ashley Soulier, DPM;  Location: [facility] ORS;  Service: Podiatry;  Laterality: Right;   ANKLE SURGERY Right 1997   ankles/screws    ARTHROSCOPIC REPAIR ACL Right 1993   BACK SURGERY  2017   titanium rods and pins in lower back   COLONOSCOPY     EPIDIDYMECTOMY Left 04/10/2017   Procedure: EPIDIDYMECTOMY;  Surgeon: Rosina Riis, MD;  Location: [facility] ORS;  Service: Urology;  Laterality: Left;   ESOPHAGOGASTRODUODENOSCOPY     LUMBAR FUSION  March 13, 2016   L4/L5 fusion   MINOR HARDWARE REMOVAL Right 04/13/2022   Procedure: REMOVAL SCREW; DEEP;  Surgeon: Ashley Soulier, DPM;  Location: [facility] ORS;  Service: Podiatry;  Laterality: Right;   TONSILLECTOMY  1962   adenoidectomy    VASECTOMY  1973   WOUND DEBRIDEMENT Right 04/13/2022   Procedure: 29906 - SUBTALAR ARTHROSCOPY;  Surgeon: Ashley Soulier, DPM;  Location: [facility] ORS;  Service: Podiatry;  Laterality: Right;   HPI:  Per MD progress note, [name] [name], Albert [name] 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     Vitals in the ED showed T of 98.7, rr of 18, hr 88,  blood pressure 131/89, SpO2 94% on room air.     Serum sodium is 141, potassium 3.7, chloride 109, bicarb 22, BUN of 24, serum creatinine 0.96, EGFR greater than 60, nonfasting blood glucose 105, WBC 6.4, hemoglobin 11.3, platelets of 274.     Outpatient MRI brain w wo contrast: Read as acute lacunar infarct within the right thalamus.   Assessment / Plan / Recommendation Clinical Impression  Pt seen for cognitive linguistic evaluation in the setting of acute CVA, with known severe dementia. Assessment consisting of pt interview and completion of MMSE (Mini Mental State Examination). Score of 11/30 obtained on MMSE falling outside the  criterion of normal limits. Suspect that baseline dementia and ?visual impairment (family not present to confirm baseline or use of glasses) impacted performance on assessment. Pt presents with cognitive communication deficit, c/b impaired orientation, short term recall, numerical reasoning, and reading/writing. Verbal cues (binary options and semantic cues) and repetition bolstered cognitive processing. Independent, simple expressive/receptive language demonstrated during assessment. Motor speech grossly intact. Pt with limited awareness for current deficits. Pt reports that spouse assists with medication/financial management.   Recommend continued assistance with complex cognitive tasks (household/medication management) and supervision for safety awareness. Recommend follow up SLP evaluation at next venue of care (preferably with family present to determine variance from baseline). Pt able to express basic wants/needs and follow commands for tasks related to hospital stay. No further acute SLP services indicated at this time.     SLP Assessment  SLP Recommendation/Assessment: All further Speech Language Pathology needs can be addressed in the next venue of care SLP Visit Diagnosis: Cognitive communication deficit (R41.841) (with baseline dementia)     Assistance Recommended at Discharge  Frequent or constant Supervision/Assistance  Functional Status Assessment Patient has not had a recent decline in their functional status (appears to be grossly at baseline for cognitive ability) family not present for verification        SLP Evaluation Cognition  Overall Cognitive Status: No family/caregiver present to determine baseline cognitive functioning Arousal/Alertness: Awake/alert Orientation Level: Oriented to person;Disoriented to place;Disoriented to time;Disoriented to situation Attention: Sustained Sustained Attention: Appears intact Memory: Impaired Memory Impairment: Decreased short term  memory (0/3) Decreased Short Term Memory: Verbal basic Awareness: Impaired Awareness Impairment: Intellectual impairment Problem Solving: Impaired Problem Solving Impairment: Verbal basic (0/5 numerical reasoning) Executive Function: Self Correcting Self Correcting: Impaired Self Correcting Impairment: Verbal basic Behaviors:  (none) Safety/Judgment: Impaired Comments: secondary to reduced insight       Comprehension  Auditory Comprehension Overall Auditory Comprehension: Appears within functional limits for tasks assessed Visual Recognition/Discrimination Discrimination: Not tested (dif with visual processing- suspect wears glasses, not present for eval) Reading Comprehension Reading Status:  (dif with visual processing- suspect wears glasses, not present for eval)    Expression Expression Primary Mode of Expression: Verbal Verbal Expression Overall Verbal Expression: Appears within functional limits for tasks assessed Written Expression Written Expression: Exceptions to [facility] Dictation Ability: Phrase   Oral / Motor  Oral Motor/Sensory Function Overall Oral Motor/Sensory Function: Within functional limits Motor Speech Overall Motor Speech: Appears within functional limits for tasks assessed           [place] Vester Titsworth Clapp, MS, CCC-SLP Speech Language Pathologist Rehab Services; [facility] [phone] (ascom)   [place] J Clapp 06/24/2024, 4:16 PM

## 2024-06-24 NOTE — Care Management Obs Status (Signed)
 MEDICARE OBSERVATION STATUS NOTIFICATION   Patient Details  Name: Albert [name] MRN: [id number] Date of Birth: 1946-06-03   Medicare Observation Status Notification Given:  Yes    Moni Rothrock W, CMA 06/24/2024, 10:57 AM

## 2024-06-24 NOTE — Evaluation (Signed)
 Occupational Therapy Evaluation Patient Details Name: Albert [name] MRN: [id number] DOB: 17-Jul-1946 Today's Date: 06/24/2024   History of Present Illness   [name] [name], Albert. 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 MRI brain w wo contrast: Read as acute lacunar infarct within the right thalamus.     Clinical Impressions Patient presenting with decreased Ind in self care,balance, functional mobility/transfers, endurance, and safety awareness. Patient's wife present in room to provide information regarding baseline. Pt needing assistance for self care from wife. She also assists with IADL tasks. Pt with history of dementia and unable to answer several questions about home set up and baseline but is pleasant and cooperative throughout session. Pt stands to brush teeth with set up A and min guard for safety.Pt then ambulates with min guard- min HHA 200'.Patient will benefit from acute OT to increase overall independence in the areas of ADLs, functional mobility, and safety awareness in order to safely discharge.     If plan is discharge home, recommend the following:   Supervision due to cognitive status;A little help with walking and/or transfers;A little help with bathing/dressing/bathroom;Direct supervision/assist for medications management;Direct supervision/assist for financial management;Assist for transportation;Help with stairs or ramp for entrance     Functional Status Assessment   Patient has had a recent decline in their functional status and demonstrates the ability to make significant improvements in function in a reasonable and predictable amount of time.     Equipment Recommendations   Other (comment) (2WW)      Precautions/Restrictions    Precautions Precautions: Fall Recall of Precautions/Restrictions: Impaired Restrictions Weight Bearing Restrictions Per Provider Order: No     Mobility Bed Mobility               General bed mobility comments: NT patient in recliner    Transfers Overall transfer level: Needs assistance Equipment used: None Transfers: Sit to/from Stand Sit to Stand: Supervision, Contact guard assist                  Balance Overall balance assessment: Mild deficits observed, not formally tested, Needs assistance   Sitting balance-Leahy Scale: Normal     Standing balance support: Single extremity supported, During functional activity Standing balance-Leahy Scale: Fair                             ADL either performed or assessed with clinical judgement   ADL Overall ADL's : Needs assistance/impaired     Grooming: Oral care;Wash/dry face;Wash/dry hands;Standing;Supervision/safety                                       Vision Patient Visual Report: No change from baseline              Pertinent Vitals/Pain Pain Assessment Pain Assessment: No/denies pain     Extremity/Trunk Assessment Upper Extremity Assessment Upper Extremity Assessment: Generalized weakness   Lower Extremity Assessment Lower Extremity Assessment: Generalized weakness       Communication Communication Communication: No apparent difficulties   Cognition Arousal: Alert Behavior During Therapy: WFL for tasks assessed/performed Cognition: History of cognitive impairments  Following commands: Intact       Cueing  General Comments   Cueing Techniques: Verbal cues              Home Living Family/patient expects to be discharged to:: Private residence Living Arrangements: Spouse/significant other Available Help at Discharge: Family;Available 24 hours/day Type of Home: Apartment Home Access: Level entry     Home  Layout: One level     Bathroom Shower/Tub: Tub/shower unit;Sponge bathes at baseline         Home Equipment: Cane - single point          Prior Functioning/Environment Prior Level of Function : Independent/Modified Independent             Mobility Comments: wife reports patient uses cane when walking out to mailbox, otherwise no AD ADLs Comments: Per wife, patient does not get into shower much, she will assist with dressing, bathing, meal set up as needed.    OT Problem List: Decreased strength;Impaired balance (sitting and/or standing);Decreased cognition;Decreased safety awareness;Decreased activity tolerance;Decreased knowledge of use of DME or AE   OT Treatment/Interventions: Self-care/ADL training;Therapeutic exercise;Patient/family education;Energy conservation;Therapeutic activities;Cognitive remediation/compensation;DME and/or AE instruction      OT Goals(Current goals can be found in the care plan section)   Acute Rehab OT Goals Patient Stated Goal: to go home OT Goal Formulation: With patient Time For Goal Achievement: 07/08/24 Potential to Achieve Goals: Fair ADL Goals Pt Will Perform Grooming: with modified independence;standing Pt Will Perform Lower Body Dressing: with supervision;sit to/from stand Pt Will Transfer to Toilet: with supervision;ambulating Pt Will Perform Toileting - Clothing Manipulation and hygiene: with supervision;sit to/from stand   OT Frequency:  Min 2X/week    Co-evaluation PT/OT/SLP Co-Evaluation/Treatment: Yes Reason for Co-Treatment: Necessary to address cognition/behavior during functional activity;For patient/therapist safety;To address functional/ADL transfers PT goals addressed during session: Mobility/safety with mobility;Balance OT goals addressed during session: ADL's and self-care      AM-PAC OT 6 Clicks Daily Activity     Outcome Measure Help from another person eating meals?: A Little Help from another person  taking care of personal grooming?: A Little Help from another person toileting, which includes using toliet, bedpan, or urinal?: A Lot Help from another person bathing (including washing, rinsing, drying)?: A Lot Help from another person to put on and taking off regular upper body clothing?: A Little Help from another person to put on and taking off regular lower body clothing?: A Lot 6 Click Score: 15   End of Session    Activity Tolerance: Patient tolerated treatment well Patient left: in bed;with call bell/phone within reach  OT Visit Diagnosis: Unsteadiness on feet (R26.81);Repeated falls (R29.6);Muscle weakness (generalized) (M62.81)                Time: 1000-1015 OT Time Calculation (min): 15 min Charges:  OT General Charges $OT Visit: 1 Visit OT Evaluation $OT Eval Low Complexity: 1 Low  Izetta Claude, MS, OTR/L , CBIS ascom [phone]  06/24/24, 4:38 PM

## 2024-06-24 NOTE — Progress Notes (Signed)
 [facility] Progress Note  Patient: Albert [name]    FMW:[id number]  DOA: 06/23/2024     Date of Service: the patient was seen and examined on 06/24/2024  Chief Complaint  Patient presents with   Stroke Symptoms   Brief hospital course: [name] [name] 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   Vitals in the ED showed T of 98.7, rr of 18, hr 88, blood pressure 131/89, SpO2 94% on room air.   Serum sodium is 141, potassium 3.7, chloride 109, bicarb 22, BUN of 24, serum creatinine 0.96, EGFR greater than 60, nonfasting blood glucose 105, WBC 6.4, hemoglobin 11.3, platelets of 274.   Outpatient MRI brain w wo contrast: Read as acute lacunar infarct within the right thalamus.   Assessment and Plan:  # Acute lacunar stroke MRI: Right thalamus lacunar infarct Patient was already on aspirin  and Plavix  which has been continued Fasting lipid LDL 61 below goal <70 and TSH 1.6 HbA1c pending Frequent neuro vascular checks PT, OT, SLP Fall precaution Follow TTE, carotid duplex Follow neurology for further recommendation   # Hypercholesterolemia Patient on high intensity rosuvastatin  20 mg nightly, resumed on admission   # HTN, goal below 140/90 Held Home amlodipine  for now Use hydralazine  as needed Monitor BP and titrate medication accordingly    # Dementia arising in the senium and presenium (HCC) Home donepezil  10 mg nightly resumed   # Anxiety Home duloxetine  60 mg daily resumed   # Chronic backache and osteoarthritis Started Tylenol  650 mg p.o. twice daily scheduled and tramadol  25 mg p.o. twice daily as needed for severe pain  Sundowning reported by patient's wife Started Seroquel  25 mg every evening   Body mass index is 26 kg/m.   Interventions:  Diet: Heart healthy/carb modified DVT Prophylaxis: Subcutaneous Lovenox    Advance goals of care discussion: Full code  Family Communication: family was present at bedside, at the time of interview.  The pt provided permission to discuss medical plan with the family. Opportunity was given to ask question and all questions were answered satisfactorily.   Disposition:  Pt is from Home, admitted with acute CVA, difficult ambulation, still has pending workup, which precludes a safe discharge. Discharge to SNF versus home with home health TBD after PT and OT eval, when stable and cleared by neurology.  Subjective: No significant events overnight, patient denies any focal deficit, AO x 1 due to severe dementia, complaining of lower backache and joint pain 2/10.  No any other complaints.  Physical Exam: General: NAD, lying comfortably Appear in no distress, affect appropriate Eyes: PERRLA ENT: Oral Mucosa Clear, moist  Neck: no JVD,  Cardiovascular: S1 and S2 Present, no Murmur,  Respiratory: good respiratory effort, Bilateral Air entry equal and Decreased, no Crackles, no wheezes Abdomen: Bowel Sound present, Soft and no tenderness,  Skin: no rashes Extremities: no Pedal edema, no calf tenderness Neurologic: without any new focal findings Gait not checked due to patient safety concerns  Vitals:   06/24/24 0154 06/24/24 0413 06/24/24 0750 06/24/24 1149  BP: 137/70 (!) 145/90 (!) 158/82 (!) 138/95  Pulse: 73 87 98 (!) 103  Resp: 18  18 18   Temp: 97.7 F (36.5 C) 97.6 F (36.4 C) 97.6 F (36.4 C) 98.1 F (36.7 C)  TempSrc: Oral Oral  Oral   SpO2: 97% 96% 96% 96%  Weight:      Height:        Intake/Output Summary (Last 24 hours) at 06/24/2024 1303 Last data filed at 06/24/2024 1041 Gross per 24 hour  Intake 240 ml  Output --  Net 240 ml   Filed Weights   06/23/24 1532  Weight: 89.4 kg    Data Reviewed: I have personally reviewed and interpreted daily labs,  tele strips, imagings as discussed above. I reviewed all nursing notes, pharmacy notes, vitals, pertinent old records I have discussed plan of care as described above with RN and patient/family.  CBC: Recent Labs  Lab 06/22/24 1603 06/23/24 1545  WBC 6.8 6.4  NEUTROABS 3.3 3.3  HGB 10.9* 11.3*  HCT 34.4* 35.4*  MCV 96.4 95.2  PLT 276 274   Basic Metabolic Panel: Recent Labs  Lab 06/22/24 1603 06/23/24 1545  NA 141 141  K 3.6 3.7  CL 105 109  CO2 26 22  GLUCOSE 89 105*  BUN 23 24*  CREATININE 1.22 0.96  CALCIUM  8.8* 9.1    Studies: MR BRAIN W WO CONTRAST Addendum Date: 06/23/2024 ADDENDUM REPORT: 06/23/2024 15:12 ADDENDUM: These results were called by telephone at the time of interpretation on 06/23/2024 at 3:11 pm to provider PA Albert [name] , who verbally acknowledged these results. Electronically Signed   By: Albert [name] M.D.   On: 06/23/2024 15:12   Result Date: 06/23/2024 CLINICAL DATA:  Increased leg weakness.  Possible stroke. EXAM: MRI HEAD WITHOUT AND WITH CONTRAST TECHNIQUE: Multiplanar, multiecho pulse sequences of the brain and surrounding structures were obtained without and with intravenous contrast. CONTRAST:  9mL GADAVIST  GADOBUTROL  1 MMOL/ML IV SOLN COMPARISON:  CT of the head dated June 22, 2024 and MRI of the head dated February 13, 2018. FINDINGS: Brain: There is restricted diffusion present within the right thalamus representing acute nonhemorrhagic lacunar infarct. There is no restricted diffusion present elsewhere throughout the brain. There is age-related cerebral and cerebellar volume loss present. There is moderate diffuse cerebral white matter disease. There is no evidence of hemorrhage, mass or hydrocephalus. There is no abnormal parenchymal or meningeal enhancement. Vascular: Normal flow voids. Skull and upper cervical spine: Normal marrow signal. No osseous lesions. Sinuses/Orbits: Clear paranasal sinuses.  Normal orbits. Other: None. IMPRESSION: 1.  Acute lacunar infarct within the right thalamus. 2. Age-related atrophy and moderate diffuse cerebral white matter disease. Electronically Signed: By: Albert [name] M.D. On: 06/23/2024 14:39    Scheduled Meds:  acetaminophen   650 mg Oral BID   [START ON 06/25/2024] amLODipine   10 mg Oral Daily   aspirin  EC  81 mg Oral Daily   clopidogrel   75 mg Oral Daily   cyanocobalamin   1,000 mcg Oral Daily   donepezil   10 mg Oral QHS   DULoxetine   60 mg Oral Daily   ferrous sulfate   325 mg Oral Q breakfast   multivitamin with minerals  1 tablet Oral Daily   nicotine   21 mg Transdermal Daily   QUEtiapine   25 mg Oral QHS   rosuvastatin   20 mg Oral QHS   Continuous Infusions: PRN Meds: hydrALAZINE , LORazepam , senna-docusate, traMADol , traZODone   Time spent: 55 minutes  Author: ELVAN SOR. MD [facility] 06/24/2024 1:03 PM  To reach On-call, see care teams to locate the attending and reach out to them via [email or web address]. If 7PM-7AM, please contact night-coverage If you still have difficulty reaching the attending provider, please page the [facility] (Director on Call) for  [facility] on amion for assistance.

## 2024-06-24 NOTE — Consult Note (Signed)
 NEUROLOGY CONSULT NOTE   Date of service: June 24, 2024 Patient Name: Albert [name] MRN:  [id number] DOB:  1946/07/05 Chief Complaint: Off balance, stroke on MRI Requesting Provider: Von Bellis, MD  History of Present Illness  Albert Mclees. is a 78 y.o. male with hx of dementia, hypertension, hyperlipidemia, prior stroke without any residual deficits, presented to the emergency department after an outpatient MRI that revealed stroke. He has been having difficulty with using his left arm and feeling off balance since sometime in the afternoon 06/22/2024, when he was about to go to lunch.  Symptoms did not resolve.  He came to the ED for evaluation.  CT head was unremarkable.  He left AMA and then went to see his primary care doctor who ordered an MRI that revealed a right thalamic lacunar infarct.  He came back for evaluation at the behest of the primary care provider. He is not able to provide very good history and is not a very good historian.  Initial part of this consult-his wife was present.  Unfortunately I had to leave for an emergent case mid  consult and when I came back, the wife was not at bedside. I did obtain a good part of the history from the wife, nurse from the chart review.  LKW: Before 12 PM on 06/22/2024 Modified rankin score: 1-No significant post stroke disability and can perform usual duties with stroke symptoms IV Thrombolysis: Outside the window EVT: Outside the window   NIHSS components Score: Comment  1a Level of Conscious 0[x]  1[]  2[]  3[]      1b LOC Questions 0[]  1[x]  2[]       1c LOC Commands 0[x]  1[]  2[]       2 Best Gaze 0[x]  1[]  2[]       3 Visual 0[x]  1[]  2[]  3[]      4 Facial Palsy 0[]  1[x]  2[]  3[]      5a Motor Arm - left 0[x]  1[]  2[]  3[]  4[]  UN[]    5b Motor Arm - Right 0[x]  1[]  2[]  3[]  4[]  UN[]    6a Motor Leg - Left 0[x]  1[]  2[]  3[]  4[]  UN[]    6b Motor Leg - Right 0[x]  1[]  2[]  3[]  4[]  UN[]    7 Limb Ataxia 0[]  1[]  2[x]  UN[]      8 Sensory 0[x]  1[]   2[]  UN[]      9 Best Language 0[x]  1[]  2[]  3[]      10 Dysarthria 0[x]  1[]  2[]  UN[]      11 Extinct. and Inattention 0[x]  1[]  2[]       TOTAL: 4      ROS  Comprehensive ROS performed and pertinent positives documented in HPI   Past History   Past Medical History:  Diagnosis Date   Anxiety    takes Xanax  daily as needed   Arthritis    knees   Back pain    Barrett esophagus    Chronic back pain    Dementia (HCC)    arising in the senium and presenium   Displaced fracture of proximal phalanx of right lesser toe(s), initial encounter for closed fracture    Diverticulosis    GERD (gastroesophageal reflux disease)    OTC meds as needed   Headache    History of colon polyps    benign    History of kidney stones    History of migraine    takes Imitrex  daily as needed.Last one 2 wks ago   Hyperlipidemia    hx of-has been off of meds for about  a yr   Hypertension    Insomnia    only recent d/t pain   Joint pain    Nephrolithiasis    Pre-diabetes    Stroke [facility])    without residual deficits    Past Surgical History:  Procedure Laterality Date   AMPUTATION TOE Right 04/13/2022   Procedure: 28820 - AMPUTATION TOE;  Surgeon: Ashley Soulier, DPM;  Location: [facility] ORS;  Service: Podiatry;  Laterality: Right;   ANKLE ARTHROSCOPY Right 04/13/2022   Procedure: A-SCOPE/OCD REPAIR;  Surgeon: Ashley Soulier, DPM;  Location: [facility] ORS;  Service: Podiatry;  Laterality: Right;   ANKLE SURGERY Right 1997   ankles/screws    ARTHROSCOPIC REPAIR ACL Right 1993   BACK SURGERY  2017   titanium rods and pins in lower back   COLONOSCOPY     EPIDIDYMECTOMY Left 04/10/2017   Procedure: EPIDIDYMECTOMY;  Surgeon: Rosina Riis, MD;  Location: [facility] ORS;  Service: Urology;  Laterality: Left;   ESOPHAGOGASTRODUODENOSCOPY     LUMBAR FUSION  March 13, 2016   L4/L5 fusion   MINOR HARDWARE REMOVAL Right 04/13/2022   Procedure: REMOVAL SCREW; DEEP;  Surgeon: Ashley Soulier, DPM;  Location: [facility] ORS;   Service: Podiatry;  Laterality: Right;   TONSILLECTOMY  1962   adenoidectomy    VASECTOMY  1973   WOUND DEBRIDEMENT Right 04/13/2022   Procedure: 29906 - SUBTALAR ARTHROSCOPY;  Surgeon: Ashley Soulier, DPM;  Location: [facility] ORS;  Service: Podiatry;  Laterality: Right;    Family History: Family History  Problem Relation Age of Onset   Arthritis Mother    Esophageal cancer Father    Prostate cancer Neg Hx    Bladder Cancer Neg Hx    Kidney cancer Neg Hx     Social History  reports that he has been smoking cigarettes. He has a 25 pack-year smoking history. He has never used smokeless tobacco. He reports that he does not drink alcohol and does not use drugs.  Allergies  Allergen Reactions   Codeine Nausea And Vomiting    Medications   Current Facility-Administered Medications:    acetaminophen  (TYLENOL ) tablet 650 mg, 650 mg, Oral, BID, Von Bellis, MD, 650 mg at 06/24/24 1318   aspirin  EC tablet 81 mg, 81 mg, Oral, Daily, Cox, Amy N, DO, 81 mg at 06/24/24 0935   clopidogrel  (PLAVIX ) tablet 75 mg, 75 mg, Oral, Daily, Cox, Amy N, DO, 75 mg at 06/24/24 0935   cyanocobalamin  (VITAMIN B12) tablet 1,000 mcg, 1,000 mcg, Oral, Daily, Cox, Amy N, DO, 1,000 mcg at 06/24/24 0935   donepezil  (ARICEPT ) tablet 10 mg, 10 mg, Oral, QHS, Cox, Amy N, DO, 10 mg at 06/23/24 2137   DULoxetine  (CYMBALTA ) DR capsule 60 mg, 60 mg, Oral, Daily, Cox, Amy N, DO, 60 mg at 06/24/24 0935   enoxaparin  (LOVENOX ) injection 40 mg, 40 mg, Subcutaneous, QPM, Von Bellis, MD   ferrous sulfate  tablet 325 mg, 325 mg, Oral, Q breakfast, Cox, Amy N, DO, 325 mg at 06/24/24 0935   hydrALAZINE  (APRESOLINE ) injection 10 mg, 10 mg, Intravenous, Q6H PRN, Von Bellis, MD   LORazepam  (ATIVAN ) injection 1 mg, 1 mg, Intravenous, Q4H PRN, Cleatus Delayne GAILS, MD   multivitamin with minerals tablet 1 tablet, 1 tablet, Oral, Daily, Cox, Amy N, DO, 1 tablet at 06/24/24 0935   nicotine  (NICODERM CQ  - dosed in mg/24 hours) patch 21 mg,  21 mg, Transdermal, Daily, Cleatus Delayne V, MD, 21 mg at 06/24/24 0936   QUEtiapine  (SEROQUEL ) tablet 25 mg, 25  mg, Oral, QHS, Cox, Amy N, DO, 25 mg at 06/23/24 2137   rosuvastatin  (CRESTOR ) tablet 20 mg, 20 mg, Oral, QHS, Cox, Amy N, DO   senna-docusate (Senokot-S) tablet 1 tablet, 1 tablet, Oral, QHS PRN, Cox, Amy N, DO   traMADol  (ULTRAM ) tablet 25 mg, 25 mg, Oral, Q12H PRN, Von Bellis, MD, 25 mg at 06/24/24 1318   traZODone  (DESYREL ) tablet 50 mg, 50 mg, Oral, QHS PRN, Cleatus Delayne GAILS, MD, 50 mg at 06/23/24 2200  Vitals   Vitals:   06/24/24 0154 06/24/24 0413 06/24/24 0750 06/24/24 1149  BP: 137/70 (!) 145/90 (!) 158/82 (!) 138/95  Pulse: 73 87 98 (!) 103  Resp: 18  18 18   Temp: 97.7 F (36.5 C) 97.6 F (36.4 C) 97.6 F (36.4 C) 98.1 F (36.7 C)  TempSrc: Oral Oral Oral   SpO2: 97% 96% 96% 96%  Weight:      Height:        Body mass index is 26 kg/m.   Physical Exam   General: Awake alert in no distress HEENT: Normocephalic atraumatic Lungs: Clear Cardiovascular: Regular rate rhythm Abdomen nondistended nontender Neurological exam Awake alert oriented to self Oriented to the fact that he is in the hospital at [facility].  Could not tell me his age correctly on the first try.  Simple commands Some aphasia Cranial nerves II to XII: Pupils equal round reactive light, extraocular movements intact, visual fields full, mild and very subtle left lower facial weakness at rest which becomes normal on smiling, diminished auditory acuity bilaterally, tongue and palate midline. Motor examination reveals no drift in any of the 4 extremities Sensation intact light touch Coordination examination reveals significant ataxia in the upper and lower extremity Gait testing deferred at this time  Labs/Imaging/Neurodiagnostic studies   CBC:  Recent Labs  Lab 06-28-24 1603 06/23/24 1545  WBC 6.8 6.4  NEUTROABS 3.3 3.3  HGB 10.9* 11.3*  HCT 34.4* 35.4*  MCV 96.4 95.2  PLT  276 274   Basic Metabolic Panel:  Lab Results  Component Value Date   NA 141 06/23/2024   K 3.7 06/23/2024   CO2 22 06/23/2024   GLUCOSE 105 (H) 06/23/2024   BUN 24 (H) 06/23/2024   CREATININE 0.96 06/23/2024   CALCIUM  9.1 06/23/2024   GFRNONAA >60 06/23/2024   GFRAA >60 03/07/2016   Lipid Panel:  Lab Results  Component Value Date   LDLCALC 61 06/24/2024   INR  Lab Results  Component Value Date   INR 1.1 06/23/2024   APTT  Lab Results  Component Value Date   APTT 31 06/23/2024  Imaging personally reviewed: CT head no acute changes MRI brain: Acute right thalamic infarct.  Age-related atrophy and moderate diffuse cerebral white matter disease.  ASSESSMENT   Albert [name] is a 78 y.o. male with history as above presenting for evaluation of being off balance and not being able to use the left side of his body well enough over the past few days.  Initially came to the ED, got a head CT which was unremarkable and left AMA but then went to the primary care who ordered an MRI that revealed a right thalamic lacunar looking infarction. Etiology likely small vessel disease  Impression: Right thalamic stroke  RECOMMENDATIONS  Admit to hospitalist Frequent rechecks Telemetry On home DAPT-continue LDL is 61.  LDL at goal.  On rosuvastatin  20 at home.  Continue rosuvastatin  20. 2D echo pending CT head and neck-ordered A1c pending.  Goal less than 7-management per primary team Therapy assessments Will follow  Plan relayed to Dr. Von ______________________________________________________________________    Signed, Eligio Lav, MD [facility]

## 2024-06-25 ENCOUNTER — Observation Stay
Admit: 2024-06-25 | Discharge: 2024-06-25 | Disposition: A | Discharge status: Home / Self Care | Attending: Internal Medicine | Admitting: Internal Medicine

## 2024-06-25 DIAGNOSIS — I739 Peripheral vascular disease, unspecified: Secondary | ICD-10-CM | POA: Diagnosis not present

## 2024-06-25 DIAGNOSIS — I6381 Other cerebral infarction due to occlusion or stenosis of small artery: Secondary | ICD-10-CM | POA: Diagnosis not present

## 2024-06-25 LAB — ECHOCARDIOGRAM COMPLETE
AR max vel: 2.56 cm2
AV Area VTI: 2.41 cm2
AV Area mean vel: 2.11 cm2
AV Mean grad: 2 mmHg
AV Peak grad: 3.8 mmHg
Ao pk vel: 0.98 m/s
Area-P 1/2: 3.05 cm2
Height: 73 in
MV VTI: 2.28 cm2
S' Lateral: 2.9 cm
Weight: 3153.46 [oz_av]

## 2024-06-25 MED ORDER — AMLODIPINE BESYLATE 5 MG PO TABS
5.0000 mg | ORAL_TABLET | Freq: Every day | ORAL | Status: DC
  Administered 2024-06-25 – 2024-06-26 (×2): 5 mg via ORAL
  Filled 2024-06-25 (×2): qty 1

## 2024-06-25 MED ORDER — QUETIAPINE FUMARATE 25 MG PO TABS
50.0000 mg | ORAL_TABLET | Freq: Every evening | ORAL | Status: DC
  Administered 2024-06-25: 50 mg via ORAL
  Filled 2024-06-25: qty 2

## 2024-06-25 NOTE — TOC Initial Note (Signed)
 Transition of Care (TOC) - Initial/Assessment Note    Patient Details  Name: Albert [name] MRN: [id number] Date of Birth: 03-14-1946  Transition of Care [facility]) CM/SW Contact:    Elouise LULLA Capri, RN 06/25/2024, 5:06 PM  Clinical Narrative:                  CM to patient's room regarding case management assessment and home health PT recommendations. Patient asleep. Patient's wife, at bedside. Patient lives with patient's wife and uses a single point cane. CM and patient's wife discussed home health recommendations, patient's wife, prefers [facility].   Alert from Dr. Von regarding therapy recommendation and requests therapy to re-assess on tomorrow. CM alert to PT Kristyn regarding therapy re-assessment.  Expected Discharge Plan: Home w Home Health Services Barriers to Discharge: Continued Medical Work up   Patient Goals and CMS Choice   Home Health vs SNF   Expected Discharge Plan and Services    Home health vs SNF   Living arrangements for the past 2 months: Apartment     Prior Living Arrangements/Services Living arrangements for the past 2 months: Apartment Lives with:: Spouse Patient language and need for interpreter reviewed:: No        Need for Family Participation in Patient Care: Yes (Comment) Care giver support system in place?: Yes (comment) Current home services: DME ([facility]) Criminal Activity/Legal Involvement Pertinent to Current Situation/Hospitalization: No - Comment as needed  Activities of Daily Living   ADL Screening (condition at time of admission) Independently performs ADLs?: Yes (appropriate for developmental age) Is the patient deaf or have difficulty hearing?: No Does the patient have difficulty seeing, even when wearing glasses/contacts?: No Does the patient have difficulty concentrating, remembering, or making decisions?: Yes  Permission Sought/Granted      Share Information with NAME: Jeramie Scogin     Permission granted to share  info w Relationship: wife     Emotional Assessment    Unable to assess    Admission diagnosis:  Ataxia [R27.0] Acute lacunar stroke [facility]) [I63.81] Cerebrovascular accident (CVA), unspecified mechanism (HCC) [I63.9] Patient Active Problem List   Diagnosis Date Noted   Acute lacunar stroke (HCC) 06/23/2024   Dementia arising in the senium and presenium (HCC) 06/24/2020   Toe fracture 02/22/2020   Displaced fracture of proximal phalanx of right lesser toe(s), initial encounter for closed fracture 07/30/2019   H/O stroke without residual deficits 03/03/2018   Prediabetes 08/01/2017   Anxiety 12/26/2016   Common migraine 12/26/2016   GERD (gastroesophageal reflux disease) 12/26/2016   Health care maintenance 12/26/2016   HTN, goal below 140/90 12/26/2016   Hypercholesterolemia 12/26/2016   Degenerative spondylolisthesis 03/13/2016   PCP:  Lenon [name] ORN, MD Pharmacy:   [id number] [place], [place] - [address] AT [facility] [address] [facility] [address] [place] [address] Phone: [phone] Fax: [phone]     Social Drivers of Health (SDOH) Social History: SDOH Screenings   Food Insecurity: Patient Unable To Answer (06/24/2024)  Housing: Patient Unable To Answer (06/24/2024)  Transportation Needs: Patient Unable To Answer (06/24/2024)  Utilities: Patient Unable To Answer (06/24/2024)  Financial Resource Strain: Patient Declined (06/12/2024)   Received from [facility]  Social Connections: Unknown (06/24/2024)  Tobacco Use: High Risk (06/23/2024)   SDOH Interventions:     Readmission Risk Interventions     No data to display

## 2024-06-25 NOTE — Progress Notes (Signed)
 NEUROLOGY CONSULT FOLLOW UP NOTE   Date of service: June 25, 2024 Patient Name: Albert [name] MRN:  [id number] DOB:  24-Sep-1946  Interval Hx/subjective  Seen and examined.  Continues to have left-sided ataxia  Vitals   Vitals:   06/24/24 2207 06/25/24 0539 06/25/24 0810 06/25/24 1137  BP: (!) 151/90 (!) 146/90 (!) 142/94 (!) 153/87  Pulse: 76 70 73 66  Resp: 18     Temp: 97.6 F (36.4 C) 98 F (36.7 C) (!) 97.4 F (36.3 C) 97.7 F (36.5 C)  TempSrc:      SpO2: 94% 94% 94% 97%  Weight:      Height:         Body mass index is 26 kg/m.  Physical Exam   General: Awake alert in no distress HEENT: Normocephalic dermatic  Clear Cardiovascular: Regular rhythm Neurologic exam Awake alert oriented x 2 Question if he has mild aphasia No dysarthria Cranial nerves II to XII intact Motor examination today shows mild drift on the left lower extremity, which is somewhat worse than yesterday Sensation intact light touch Coordination examination with significant left upper and lower extremity dysmetria-the left upper extremity dysmetria is somewhat more prominent    Medications  Current Facility-Administered Medications:    acetaminophen  (TYLENOL ) tablet 650 mg, 650 mg, Oral, BID, Von Bellis, MD, 650 mg at 06/25/24 0840   aspirin  EC tablet 81 mg, 81 mg, Oral, Daily, Cox, Amy N, DO, 81 mg at 06/25/24 0840   clopidogrel  (PLAVIX ) tablet 75 mg, 75 mg, Oral, Daily, Cox, Amy N, DO, 75 mg at 06/25/24 0840   cyanocobalamin  (VITAMIN B12) tablet 1,000 mcg, 1,000 mcg, Oral, Daily, Cox, Amy N, DO, 1,000 mcg at 06/25/24 0840   donepezil  (ARICEPT ) tablet 10 mg, 10 mg, Oral, QHS, Cox, Amy N, DO, 10 mg at 06/24/24 2241   DULoxetine  (CYMBALTA ) DR capsule 60 mg, 60 mg, Oral, Daily, Cox, Amy N, DO, 60 mg at 06/25/24 0840   enoxaparin  (LOVENOX ) injection 40 mg, 40 mg, Subcutaneous, QPM, Von Bellis, MD, 40 mg at 06/24/24 1745   ferrous sulfate  tablet 325 mg, 325 mg, Oral, Q breakfast,  Cox, Amy N, DO, 325 mg at 06/25/24 0840   hydrALAZINE  (APRESOLINE ) injection 10 mg, 10 mg, Intravenous, Q6H PRN, Von Bellis, MD   multivitamin with minerals tablet 1 tablet, 1 tablet, Oral, Daily, Cox, Amy N, DO, 1 tablet at 06/25/24 0840   nicotine  (NICODERM CQ  - dosed in mg/24 hours) patch 21 mg, 21 mg, Transdermal, Daily, Cleatus Hoof V, MD, 21 mg at 06/25/24 9160   QUEtiapine  (SEROQUEL ) tablet 25 mg, 25 mg, Oral, QHS, Cox, Amy N, DO, 25 mg at 06/24/24 2241   rosuvastatin  (CRESTOR ) tablet 20 mg, 20 mg, Oral, QHS, Cox, Amy N, DO, 20 mg at 06/24/24 2241   senna-docusate (Senokot-S) tablet 1 tablet, 1 tablet, Oral, QHS PRN, Cox, Amy N, DO   traMADol  (ULTRAM ) tablet 25 mg, 25 mg, Oral, Q12H PRN, Von Bellis, MD, 25 mg at 06/24/24 1318   traZODone  (DESYREL ) tablet 50 mg, 50 mg, Oral, QHS PRN, Cleatus Hoof GAILS, MD, 50 mg at 06/23/24 2200  Labs and Diagnostic Imaging   CBC:  Recent Labs  Lab 06/22/24 1603 06/23/24 1545  WBC 6.8 6.4  NEUTROABS 3.3 3.3  HGB 10.9* 11.3*  HCT 34.4* 35.4*  MCV 96.4 95.2  PLT 276 274    Basic Metabolic Panel:  Lab Results  Component Value Date   NA 141 06/23/2024   K 3.7  06/23/2024   CO2 22 06/23/2024   GLUCOSE 105 (H) 06/23/2024   BUN 24 (H) 06/23/2024   CREATININE 0.96 06/23/2024   CALCIUM  9.1 06/23/2024   GFRNONAA >60 06/23/2024   GFRAA >60 03/07/2016   Lipid Panel:  Lab Results  Component Value Date   LDLCALC 61 06/24/2024   HgbA1c:  Lab Results  Component Value Date   HGBA1C 5.2 06/24/2024   Urine Drug Screen:     Component Value Date/Time   LABOPIA NONE DETECTED 06/22/2024 1725   COCAINSCRNUR NONE DETECTED 06/22/2024 1725   LABBENZ NONE DETECTED 06/22/2024 1725   AMPHETMU NONE DETECTED 06/22/2024 1725   THCU NONE DETECTED 06/22/2024 1725   LABBARB NONE DETECTED 06/22/2024 1725    INR  Lab Results  Component Value Date   INR 1.1 06/23/2024   APTT  Lab Results  Component Value Date   APTT 31 06/23/2024   Personally  reviewed-CT head no acute changes, MRI brain acute right thalamic infarct and age-related atrophy.  CT angiography head and neck shows bilateral fetal PCAs.  Evolving lacunar infarct in the right thalamus.  Moderately advanced white matter disease.  Emphysema.  2D echo with EF 65 to 70%, mitral valve normal, aortic valve normal, poor quality bubble study.  Left atrial normal size  Assessment   Ziare P Rafe Mackowski. is a 78 y.o. male 78 year old with past history of dementia, hypertension, hyperlipidemia, prior stroke without any residual deficits presenting after sudden onset of left arm weakness and feeling off balance with MRI imaging revealing a right thalamic infarct.  The appearance of the stroke is in line with small vessel disease.   Risk factor workup-CTA head and neck does not show any large vessel concerns No left atrial dilatation on 2D echo.  Normal EF. Impression: Acute ischemic stroke, likely small vessel etiology  Recommendations  He is currently on DAPT at home.  Continue aspirin  and Plavix . His LDL is at goal on home rosuvastatin  20.  Continue rosuvastatin  20. A1c is also at goal Continue therapy assessments and follow recommendations Needs outpatient follow-up with neurology in 8 to 12 weeks. Inpatient neurology will be available as needed Plan discussed with wife and patient at bedside Plan discussed with Dr. Von via secure chat ______________________________________________________________________   Signed, Eligio Lav, MD [facility]

## 2024-06-25 NOTE — Progress Notes (Signed)
*  PRELIMINARY RESULTS* Echocardiogram 2D Echocardiogram has been performed.  Albert [name] 06/25/2024, 7:46 AM

## 2024-06-25 NOTE — Progress Notes (Signed)
 [facility] Progress Note  Patient: Albert [name]    FMW:[id number]  DOA: 06/23/2024     Date of Service: the patient was seen and examined on 06/25/2024  Chief Complaint  Patient presents with   Stroke Symptoms   Brief hospital course: Albert [name], Albert [name] 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   Vitals in the ED showed T of 98.7, rr of 18, hr 88, blood pressure 131/89, SpO2 94% on room air.   Serum sodium is 141, potassium 3.7, chloride 109, bicarb 22, BUN of 24, serum creatinine 0.96, EGFR greater than 60, nonfasting blood glucose 105, WBC 6.4, hemoglobin 11.3, platelets of 274.   Outpatient MRI brain w wo contrast: Read as acute lacunar infarct within the right thalamus.   Assessment and Plan:  # Acute lacunar stroke MRI: Right thalamus lacunar infarct Patient was already on aspirin  and Plavix  which has been continued Fasting lipid LDL 61 below goal <70 and TSH 1.6 and HbA1c 5.2 wnl Frequent neuro vascular checks PT, OT, SLP Fall precaution TTE LVEF 65 to 70%, grade 1 diastolic dysfunction, no any other abnormal findings.  Negative PFO CTA head and neck negative for any large vessel occlusion. Neurology recommended to continue aspirin  Plavix  and Crestor  home dose.  Follow-up with neurology as an outpatient.  No need of Zio patch. Follow TOC for discharge planning tomorrow a.m.   # Hypercholesterolemia Patient on high intensity rosuvastatin  20 mg nightly, resumed on admission   # HTN, goal below 140/90 Held Home amlodipine  for now Use hydralazine  as needed 7/10 resumed amlodipine  at low-dose 5 mg p.o. daily with holding parameters Monitor BP and titrate medication accordingly  # Dementia arising in the senium and presenium (HCC) Home  donepezil  10 mg nightly resumed   # Anxiety Home duloxetine  60 mg daily resumed   # Chronic backache and osteoarthritis Started Tylenol  650 mg p.o. twice daily scheduled and tramadol  25 mg p.o. twice daily as needed for severe pain  # Sundowning reported by patient's wife Started Seroquel  25 mg every evening 7/10 increased Seroquel  50 mg every evening   Body mass index is 26 kg/m.  Interventions:  Diet: Heart healthy/carb modified DVT Prophylaxis: Subcutaneous Lovenox    Advance goals of care discussion: Full code  Family Communication: family was present at bedside, at the time of interview.  The pt provided permission to discuss medical plan with the family. Opportunity was given to ask question and all questions were answered satisfactorily.   Disposition:  Pt is from Home, admitted with acute CVA, difficult ambulation, still has pending workup, which precludes a safe discharge. Discharge to SNF versus home with home health TBD after PT and OT eval. patient was cleared by neurology. Follow TOC for disposition plan  Subjective: No significant events overnight, patient did get confused and some agitation around 10 PM last night then he slept okay.  In the morning time he was resting well, denied any active issues, no focal deficits.   Physical Exam: General: NAD, lying comfortably Appear in no distress, affect appropriate Eyes: PERRLA ENT: Oral Mucosa Clear, moist  Neck: no JVD,  Cardiovascular: S1 and S2 Present, no Murmur,  Respiratory: good respiratory effort, Bilateral Air entry equal and Decreased, no Crackles, no wheezes Abdomen: Bowel Sound present, Soft and no tenderness,  Skin: no rashes Extremities: no Pedal edema, no calf tenderness Neurologic: without any new focal findings Gait not checked due to patient safety concerns  Vitals:   06/25/24 0539 06/25/24 0810 06/25/24 1137 06/25/24 1627  BP: (!) 146/90 (!) 142/94 (!) 153/87 (!) 144/70  Pulse: 70 73 66 74   Resp:    16  Temp: 98 F (36.7 C) (!) 97.4 F (36.3 C) 97.7 F (36.5 C)   TempSrc:      SpO2: 94% 94% 97% 97%  Weight:      Height:        Intake/Output Summary (Last 24 hours) at 06/25/2024 1651 Last data filed at 06/25/2024 1450 Gross per 24 hour  Intake 340 ml  Output --  Net 340 ml   Filed Weights   06/23/24 1532  Weight: 89.4 kg    Data Reviewed: I have personally reviewed and interpreted daily labs, tele strips, imagings as discussed above. I reviewed all nursing notes, pharmacy notes, vitals, pertinent old records I have discussed plan of care as described above with RN and patient/family.  CBC: Recent Labs  Lab 06/22/24 1603 06/23/24 1545  WBC 6.8 6.4  NEUTROABS 3.3 3.3  HGB 10.9* 11.3*  HCT 34.4* 35.4*  MCV 96.4 95.2  PLT 276 274   Basic Metabolic Panel: Recent Labs  Lab 06/22/24 1603 06/23/24 1545  NA 141 141  K 3.6 3.7  CL 105 109  CO2 26 22  GLUCOSE 89 105*  BUN 23 24*  CREATININE 1.22 0.96  CALCIUM  8.8* 9.1    Studies: ECHOCARDIOGRAM COMPLETE Result Date: 06/25/2024    ECHOCARDIOGRAM REPORT   Patient Name:   Albert [name] Date of Exam: 06/25/2024 Medical Rec #:  [id number]          Height:       73.0 in Accession #:    [id number]         Weight:       197.1 lb Date of Birth:  17-Dec-1946          BSA:          2.138 m Patient Age:    78 years           BP:           146/90 mmHg Patient Gender: M                  HR:           70 bpm. Exam Location:  [facility] Procedure: 2D Echo, Cardiac Doppler, Color Doppler and Saline Contrast Bubble            Study (Both Spectral and Color Flow Doppler were utilized during            procedure). Indications:     Stroke I63.9  History:         Patient has no prior history of Echocardiogram examinations.                  Risk Factors:Hypertension and Dyslipidemia. Anxiety.  Sonographer:     Christopher Furnace Referring Phys:  [id number] AMY N COX Diagnosing Phys: Keller Paterson  Sonographer Comments: Image acquisition  challenging due to respiratory motion. IMPRESSIONS  1. Left ventricular ejection fraction, by estimation, is 65 to 70%. The left ventricle has normal function. The left ventricle has no regional wall motion abnormalities. There is moderate left ventricular hypertrophy. Left ventricular diastolic parameters are consistent with Grade I diastolic dysfunction (impaired relaxation).  2. Right ventricular systolic function is normal. The right ventricular size is normal.  3. The mitral valve is normal in structure. No evidence of mitral valve regurgitation.  4. The aortic valve is tricuspid. Aortic valve regurgitation is not visualized. No aortic stenosis is present.  5. Poor quality bubble study. FINDINGS  Left Ventricle: Left ventricular ejection fraction, by estimation, is 65 to 70%. The left ventricle has normal function. The left ventricle has no regional wall motion abnormalities. The left ventricular internal cavity size was normal in size. There is  moderate left ventricular hypertrophy. Left ventricular diastolic parameters are consistent with Grade I diastolic dysfunction (impaired relaxation). Right Ventricle: The right ventricular size is normal. No increase in right ventricular wall thickness. Right ventricular systolic function is normal. Left Atrium: Left atrial size was normal in size. Right Atrium: Right atrial size was normal in size. Pericardium: There is no evidence of pericardial effusion. Mitral Valve: The mitral valve is normal in structure. No evidence of mitral valve regurgitation. MV peak gradient, 3.0 mmHg. The mean mitral valve gradient is 1.0 mmHg. Tricuspid Valve: The tricuspid valve is not well visualized. Tricuspid valve regurgitation is trivial. Aortic Valve: The aortic valve is tricuspid. Aortic valve regurgitation is not visualized. No aortic stenosis is present. Aortic valve mean gradient measures 2.0 mmHg. Aortic valve peak gradient measures 3.8 mmHg. Aortic valve area, by VTI measures  2.41 cm. Pulmonic Valve: The pulmonic valve was not well visualized. Pulmonic valve regurgitation is not visualized. Aorta: The aortic root is normal in size and structure. Venous: The inferior vena cava was not well visualized. IAS/Shunts: The interatrial septum was not well visualized. Agitated saline contrast was given intravenously to evaluate for intracardiac shunting. Poor quality bubble study.  LEFT VENTRICLE PLAX 2D LVIDd:         4.60 cm   Diastology LVIDs:         2.90 cm   LV e' medial:    7.07 cm/s LV PW:         1.10 cm   LV E/e' medial:  6.7 LV IVS:        1.50 cm   LV e' lateral:   7.18 cm/s LVOT diam:     2.00 cm   LV E/e' lateral: 6.6 LV SV:         41 LV SV Index:   19 LVOT Area:     3.14 cm  RIGHT VENTRICLE RV Basal diam:  3.60 cm RV Mid diam:    2.80 cm RV S prime:     18.20 cm/s TAPSE (M-mode): 2.5 cm LEFT ATRIUM           Index        RIGHT ATRIUM           Index LA diam:      2.60 cm 1.22 cm/m   RA Area:     15.90 cm LA Vol (A2C): 61.8 ml 28.90 ml/m  RA Volume:   40.50 ml  18.94 ml/m LA Vol (A4C): 27.4 ml 12.81 ml/m  AORTIC VALVE AV Area (Vmax):    2.56 cm AV Area (Vmean):   2.11 cm AV Area (VTI):     2.41 cm AV Vmax:           97.60 cm/s AV Vmean:          72.600 cm/s AV VTI:            0.168 m AV Peak Grad:  3.8 mmHg AV Mean Grad:      2.0 mmHg LVOT Vmax:         79.60 cm/s LVOT Vmean:        48.800 cm/s LVOT VTI:          0.129 m LVOT/AV VTI ratio: 0.77  AORTA Ao Root diam: 3.70 cm MITRAL VALVE               TRICUSPID VALVE MV Area (PHT): 3.05 cm    TR Peak grad:   10.5 mmHg MV Area VTI:   2.28 cm    TR Vmax:        162.00 cm/s MV Peak grad:  3.0 mmHg MV Mean grad:  1.0 mmHg    SHUNTS MV Vmax:       0.86 m/s    Systemic VTI:  0.13 m MV Vmean:      56.4 cm/s   Systemic Diam: 2.00 cm MV Decel Time: 249 msec MV E velocity: 47.10 cm/s MV A velocity: 68.10 cm/s MV E/A ratio:  0.69 Keller Alluri Electronically signed by Keller Paterson Signature Date/Time: 06/25/2024/1:08:46 PM     Final     Scheduled Meds:  acetaminophen   650 mg Oral BID   aspirin  EC  81 mg Oral Daily   clopidogrel   75 mg Oral Daily   cyanocobalamin   1,000 mcg Oral Daily   donepezil   10 mg Oral QHS   DULoxetine   60 mg Oral Daily   enoxaparin  (LOVENOX ) injection  40 mg Subcutaneous QPM   ferrous sulfate   325 mg Oral Q breakfast   multivitamin with minerals  1 tablet Oral Daily   nicotine   21 mg Transdermal Daily   QUEtiapine   25 mg Oral QHS   rosuvastatin   20 mg Oral QHS   Continuous Infusions: PRN Meds: hydrALAZINE , senna-docusate, traMADol , traZODone   Time spent: 40 minutes  Author: ELVAN SOR. MD [facility] 06/25/2024 4:51 PM  To reach On-call, see care teams to locate the attending and reach out to them via [email or web address]. If 7PM-7AM, please contact night-coverage If you still have difficulty reaching the attending provider, please page the [facility] (Director on Call) for [facility] on amion for assistance.

## 2024-06-26 ENCOUNTER — Other Ambulatory Visit: Payer: Self-pay

## 2024-06-26 DIAGNOSIS — I6381 Other cerebral infarction due to occlusion or stenosis of small artery: Secondary | ICD-10-CM | POA: Diagnosis not present

## 2024-06-26 MED ORDER — TRAMADOL HCL 50 MG PO TABS
50.0000 mg | ORAL_TABLET | Freq: Four times a day (QID) | ORAL | 0 refills | Status: AC | PRN
  Filled 2024-06-26: qty 20, 5d supply, fill #0

## 2024-06-26 NOTE — Plan of Care (Signed)
  Problem: Ischemic Stroke/TIA Tissue Perfusion: Goal: Complications of ischemic stroke/TIA will be minimized Outcome: Progressing   Problem: Clinical Measurements: Goal: Ability to maintain clinical measurements within normal limits will improve Outcome: Progressing   Problem: Activity: Goal: Risk for activity intolerance will decrease Outcome: Progressing   Problem: Nutrition: Goal: Adequate nutrition will be maintained Outcome: Progressing

## 2024-06-26 NOTE — Progress Notes (Signed)
 Physical Therapy Treatment Patient Details Name: Albert [name] MRN: [id number] DOB: 1946-10-02 Today's Date: 06/26/2024   History of Present Illness [name] [name], [name] 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 MRI brain w wo contrast: Read as acute lacunar infarct within the right thalamus.    PT Comments  Pt is supine in bed with HOB elevated, pleasant and agreeable to therapy today. PT focused today's session on trialing AD during ambulation. During session pt was able to perform bed mobility with SBA (for increased safety), performed 3x trials of STS from EOB (2x STS with CGA for safety, 1x STS without additional support), and was able to ambulate 2x180 ft without a rest (1st trial with RW, required increased cuing for safe maneuvering and with steering of RW. Upon exiting the room, pt experienced a LOB without a fall requiring min a to correct secondary to RW getting caught between his feet. 2nd ambulation trial with SPC, pt looked much more stable, and able to perform high dynamic balance activities without an overt LOB. Pt and family educated on the importance of safe steady ambulation (recommendation for [facility] use), regulating speed and having adequate time to awaken prior to mobility. Pt would benefit from skilled PT to continue to work towards PT goals and return to PLOF.    Pre session: HR: 72 bpm, SpO2: 93% on room air,  Post ambulation: HR: 87 bpm, SpO2: 99% on room air.    If plan is discharge home, recommend the following: A little help with walking and/or transfers;A little help with bathing/dressing/bathroom;Help with stairs or ramp for entrance;Direct supervision/assist for financial management;Supervision due to cognitive status;Direct supervision/assist for  medications management;Assistance with cooking/housework   Can travel by private vehicle      Yes  Equipment Recommendations  Single point cane   Recommendations for Other Services       Precautions / Restrictions Precautions Precautions: Fall Recall of Precautions/Restrictions: Impaired Restrictions Weight Bearing Restrictions Per Provider Order: No     Mobility  Bed Mobility Overal bed mobility: Needs Assistance Bed Mobility: Supine to Sit, Sit to Supine     Supine to sit: Supervision Sit to supine: Supervision   General bed mobility comments: Moves very fast, needs reminders to slow down to improve safety    Transfers Overall transfer level: Needs assistance Equipment used: None Transfers: Sit to/from Stand Sit to Stand: Supervision, Contact guard assist, Modified independent (Device/Increase time)           General transfer comment: 3x STS from EOB ( 2x STS with CGA/SBA for safety, 1x STS without additional support, mod independent but impulsive.)    Ambulation/Gait Ambulation/Gait assistance: Contact guard assist   Assistive device: Rolling walker (2 wheels), Straight cane Gait Pattern/deviations: Step-through pattern, Decreased step length - right, Decreased step length - left, Decreased stride length, Drifts right/left Gait velocity: Decreased 2.46 Gait velocity interpretation: 1.31 - 2.62 ft/sec, indicative of limited community ambulator   General Gait Details: 2x180 ft without a rest (1st trial with RW, needs vc's for safe maneuvering and with steering. Pt noted increased clumsiness with RW, with a LOB without a fall requiring min a to correct secondary to RW getting caught between his feet. 2nd trial with SPC, pt looked much more stable, and able to perform high dynamic balance activities  without an overt LOB.   Stairs             Wheelchair Mobility     Tilt Bed    Modified Rankin (Stroke Patients Only)       Balance Overall balance  assessment: Needs assistance Sitting-balance support: Single extremity supported, No upper extremity supported, Bilateral upper extremity supported Sitting balance-Leahy Scale: Normal Sitting balance - Comments: Steady static seated balance   Standing balance support: Single extremity supported, During functional activity, Bilateral upper extremity supported Standing balance-Leahy Scale: Fair Standing balance comment: Slight LOB with RW use during dynamic activity. No change of pace or increased deviation with higher level balance activities                            Communication Communication Communication: No apparent difficulties  Cognition Arousal: Alert Behavior During Therapy: WFL for tasks assessed/performed   PT - Cognitive impairments: History of cognitive impairments, Orientation, Awareness, Memory, Problem solving, Safety/Judgement   Orientation impairments: Place, Time, Situation                   PT - Cognition Comments: Pleasantly confused Following commands: Intact      Cueing Cueing Techniques: Verbal cues  Exercises      General Comments        Pertinent Vitals/Pain Pain Assessment Pain Assessment: PAINAD Breathing: normal Negative Vocalization: occasional moan/groan, low speech, negative/disapproving quality Facial Expression: smiling or inexpressive Body Language: relaxed Consolability: no need to console PAINAD Score: 1 Pain Intervention(s): Limited activity within patient's tolerance, Monitored during session, Repositioned    Home Living                          Prior Function            PT Goals (current goals can now be found in the care plan section) Acute Rehab PT Goals Patient Stated Goal: to return home PT Goal Formulation: With family Time For Goal Achievement: 07/08/24 Potential to Achieve Goals: Good Progress towards PT goals: Progressing toward goals    Frequency    Min 2X/week      PT  Plan      Co-evaluation              AM-PAC PT 6 Clicks Mobility   Outcome Measure  Help needed turning from your back to your side while in a flat bed without using bedrails?: None Help needed moving from lying on your back to sitting on the side of a flat bed without using bedrails?: None Help needed moving to and from a bed to a chair (including a wheelchair)?: None Help needed standing up from a chair using your arms (e.g., wheelchair or bedside chair)?: A Little Help needed to walk in hospital room?: A Little Help needed climbing 3-5 steps with a railing? : A Lot 6 Click Score: 20    End of Session Equipment Utilized During Treatment: Gait belt Activity Tolerance: Patient tolerated treatment well Patient left: with family/visitor present;in bed (Pt in the presence of family; family requesting to have bed alarm off while in room. RN notifed and made aware) Nurse Communication: Mobility status (Family requesting to have bed alarm off while in room.) PT Visit Diagnosis: Unsteadiness on feet (R26.81);Other abnormalities of gait and mobility (R26.89)     Time: 8670-8647 PT Time Calculation (min) (ACUTE ONLY): 23 min  Charges:  Glenette Bookwalter, SPT 06/26/24, 4:50 PM

## 2024-06-26 NOTE — Progress Notes (Signed)
 Mobility Specialist - Progress Note     06/26/24 1200  Mobility  Activity Ambulated with assistance in hallway  Level of Assistance Contact guard assist, steadying assist  Assistive Device Front wheel walker  Distance Ambulated (ft) 160 ft  Range of Motion/Exercises Active  Activity Response Tolerated well  Mobility Referral Yes  Mobility visit 1 Mobility  Mobility Specialist Start Time (ACUTE ONLY) 1159  Mobility Specialist Stop Time (ACUTE ONLY) 1213  Mobility Specialist Time Calculation (min) (ACUTE ONLY) 14 min   Pt resting in bed on RA upon entry. Pt STS and ambulates to hallway around NS CGA with RW. Pt brief LOB but quickly recovers. Pt requires extra time to answer questions and given directional cuing. Pt returned to EOB and left with needs in reach. Wife present at bedside.   Guido Rumble Mobility Specialist 06/26/24, 12:52 PM

## 2024-06-26 NOTE — Discharge Summary (Addendum)
 Physician Discharge Summary   Patient: Albert [name] 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 [facility] Course: [name] [name], [name] 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   Vitals in the ED showed T of 98.7, rr of 18, hr 88, blood pressure 131/89, SpO2 94% on room air.   Serum sodium is 141, potassium 3.7, chloride 109, bicarb 22, BUN of 24, serum creatinine 0.96, EGFR greater than 60, nonfasting blood glucose 105, WBC 6.4, hemoglobin 11.3, platelets of 274.   Outpatient MRI brain w wo contrast: Read as acute lacunar infarct within the right thalamus.   ED treatment: None ------------------------------------------ At bedside, patient is able to tell me his first and last name and the name that he goes by which is patent.  He was not able to tell me his age.  He states his age is in the 77s.  Initially he states he is in the 77s.  He was not able to tell me his current location, or the current calendar year.  He is able to identify his wife at bedside.   Spouse states that the symptoms started yesterday noon.  She noticed that he had difficulty walking.   Patient denies being in pain at this  time.   Social history: He lives at home with his wife.   ROS: unable to complete due to patient with moderate to advanced dementia   ED Course: Discussed with EDP, patient requiring hospitalization for chief concerns of stroke.      Assessment and Plan:  # Acute lacunar stroke MRI: Right thalamus lacunar infarct Patient was already on aspirin  and Plavix  which has been continued Fasting lipid LDL 61 below goal <70 and TSH 1.6 and HbA1c 5.2 wnl Frequent neuro vascular checks Appreciate PT, OT, SLP input Fall precaution TTE LVEF 65 to 70%, grade 1 diastolic dysfunction, no any other abnormal findings.  Negative PFO CTA head and neck negative for any large vessel occlusion. Neurology recommended to continue aspirin  Plavix  and Crestor  home dose.  Follow-up with neurology as an outpatient.  No need for Zio patch. 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 twice daily scheduled and tramadol  25 mg p.o. twice daily as needed for severe pain   # Sundowning  Dementia reported by patient's wife Started Seroquel  25 mg every evening 7/10 increased Seroquel  50 mg every ev        Consultants: Neurology Procedures performed: 2D echocardiogram Disposition: Home  health Diet recommendation:  Discharge Diet Orders (From admission, onward)     Start     Ordered   06/26/24 0000  Diet - low sodium heart healthy        06/26/24 1548           Cardiac diet DISCHARGE MEDICATION: Allergies as of 06/26/2024       Reactions   Codeine Nausea And Vomiting        Medication List     STOP taking these medications    dibucaine 1 % Oint Commonly known as: NUPERCAINAL   gabapentin  100 MG capsule Commonly known  as: NEURONTIN    hydrocortisone  25 MG suppository Commonly known as: ANUSOL -HC   ibuprofen  800 MG tablet Commonly known as: ADVIL    oxyCODONE -acetaminophen  5-325 MG tablet Commonly known as: Percocet   SUMAtriptan  50 MG tablet Commonly known as: IMITREX        TAKE these medications    acetaminophen  325 MG tablet Commonly known as: TYLENOL  Take 650 mg by mouth every 8 (eight) hours as needed for moderate pain.   ALPRAZolam  0.25 MG tablet Commonly known as: XANAX  Take 0.25 mg by mouth at bedtime as needed for anxiety.   amLODipine  10 MG tablet Commonly known as: NORVASC  Take 10 mg by mouth daily.   aspirin  EC 81 MG tablet Take 81 mg by mouth daily.   clopidogrel  75 MG tablet Commonly known as: PLAVIX  75 mg daily.   cyanocobalamin  1000 MCG tablet Commonly known as: VITAMIN B12 Take 1,000 mcg by mouth.  Take 1,000 mcg by mouth once daily   donepezil  10 MG tablet Commonly known as: ARICEPT  Take 10 mg by mouth.  TAKE 1 TABLET(10 MG) BY MOUTH AT BEDTIME What changed: Another medication with the same name was removed. Continue taking this medication, and follow the directions you see here.   DULoxetine  60 MG capsule Commonly known as: CYMBALTA  Take 60 mg by mouth daily.   ferrous sulfate  325 (65 FE) MG tablet Take 325 mg by mouth.  Take 325 mg by mouth daily with breakfast   multivitamin with minerals Tabs tablet Take 1 tablet by mouth daily.   naphazoline-pheniramine 0.025-0.3 % ophthalmic solution Commonly known as: NAPHCON-A Place 1 drop into both eyes 4 (four) times daily as needed for eye irritation.   QUEtiapine  25 MG tablet Commonly known as: SEROQUEL  Take 25 mg by mouth at bedtime.   rosuvastatin  20 MG tablet Commonly known as: CRESTOR  Take 20 mg by mouth daily.   traMADol  50 MG tablet Commonly known as: Ultram  Take 1 tablet (50 mg total) by mouth every 6 (six) hours as needed for up to 5 days.   trolamine salicylate 10 % cream Commonly known  as: ASPERCREME Apply 1 application topically as needed for muscle pain.               Durable Medical Equipment  (From admission, onward)           Start     Ordered   06/26/24 1552  For home use only DME Walker rolling  Once       Question Answer Comment  Walker: With 5 Inch Wheels   Patient needs a walker to treat with the following condition Generalized weakness      06/26/24 1552            Follow-up Information     Lenon [name] ORN, MD. Go on 07/01/2024.   Specialty: Internal Medicine Why: @ 9am Contact information: [address]  [facility] [place] [address] [phone]         [facility], [facility] Follow up.   Why: 07/11---Per [facility] following for home health PT Contact information: [address] [place] [address] [phone]         Maree Jannett POUR, MD Follow up in 8 week(s).   Specialty: Neurology Why: Office closed today at noon   Patient to make own follow up appt Contact information: [address] [facility] [address] [facility] [place] [address] [phone]                Discharge Exam: Fredricka Weights   06/23/24 1532  Weight: 89.4 kg   General: NAD, lying comfortably Appear in no distress, affect appropriate Eyes: PERRLA ENT: Oral Mucosa Clear, moist  Neck: no JVD,  Cardiovascular: S1 and S2 Present, no Murmur,  Respiratory: good respiratory effort, Bilateral Air entry , no Crackles, no wheezes Abdomen: Bowel Sound present, Soft and no tenderness,  Skin: no rashes Extremities: no Pedal edema, no calf tenderness Neurologic: without any new focal findings Gait not checked due to patient safety concerns    Condition at discharge: stable  The results of significant diagnostics from this hospitalization (including imaging, microbiology, ancillary and laboratory) are listed below for reference.   Imaging Studies: ECHOCARDIOGRAM  COMPLETE Result Date: 06/25/2024    ECHOCARDIOGRAM REPORT   Patient Name:   Rien Marland. Date of Exam: 06/25/2024 Medical Rec #:  [id number]          Height:       73.0 in Accession #:    [id number]         Weight:       197.1 lb Date of Birth:  1946-03-12          BSA:          2.138 m Patient Age:    78 years           BP:           146/90 mmHg Patient Gender: M                  HR:           70 bpm. Exam Location:  [facility] Procedure: 2D Echo, Cardiac Doppler, Color Doppler and Saline Contrast Bubble            Study (Both Spectral and Color Flow Doppler were utilized during            procedure). Indications:     Stroke I63.9  History:         Patient has no prior history of Echocardiogram examinations.                  Risk Factors:Hypertension and Dyslipidemia. Anxiety.  Sonographer:     Christopher Furnace Referring Phys:  [id number] AMY N COX Diagnosing Phys: Keller Paterson  Sonographer Comments: Image acquisition challenging due to respiratory motion. IMPRESSIONS  1. Left ventricular ejection fraction, by estimation, is 65 to 70%. The left ventricle has normal function. The left ventricle has no regional wall motion abnormalities. There is moderate left ventricular hypertrophy. Left ventricular diastolic parameters are consistent with Grade I diastolic dysfunction (impaired relaxation).  2. Right ventricular systolic function is normal. The right ventricular size is normal.  3. The mitral valve is normal in structure. No evidence of mitral valve regurgitation.  4. The aortic valve is tricuspid. Aortic  valve regurgitation is not visualized. No aortic stenosis is present.  5. Poor quality bubble study. FINDINGS  Left Ventricle: Left ventricular ejection fraction, by estimation, is 65 to 70%. The left ventricle has normal function. The left ventricle has no regional wall motion abnormalities. The left ventricular internal cavity size was normal in size. There is  moderate left ventricular hypertrophy. Left ventricular  diastolic parameters are consistent with Grade I diastolic dysfunction (impaired relaxation). Right Ventricle: The right ventricular size is normal. No increase in right ventricular wall thickness. Right ventricular systolic function is normal. Left Atrium: Left atrial size was normal in size. Right Atrium: Right atrial size was normal in size. Pericardium: There is no evidence of pericardial effusion. Mitral Valve: The mitral valve is normal in structure. No evidence of mitral valve regurgitation. MV peak gradient, 3.0 mmHg. The mean mitral valve gradient is 1.0 mmHg. Tricuspid Valve: The tricuspid valve is not well visualized. Tricuspid valve regurgitation is trivial. Aortic Valve: The aortic valve is tricuspid. Aortic valve regurgitation is not visualized. No aortic stenosis is present. Aortic valve mean gradient measures 2.0 mmHg. Aortic valve peak gradient measures 3.8 mmHg. Aortic valve area, by VTI measures 2.41 cm. Pulmonic Valve: The pulmonic valve was not well visualized. Pulmonic valve regurgitation is not visualized. Aorta: The aortic root is normal in size and structure. Venous: The inferior vena cava was not well visualized. IAS/Shunts: The interatrial septum was not well visualized. Agitated saline contrast was given intravenously to evaluate for intracardiac shunting. Poor quality bubble study.  LEFT VENTRICLE PLAX 2D LVIDd:         4.60 cm   Diastology LVIDs:         2.90 cm   LV e' medial:    7.07 cm/s LV PW:         1.10 cm   LV E/e' medial:  6.7 LV IVS:        1.50 cm   LV e' lateral:   7.18 cm/s LVOT diam:     2.00 cm   LV E/e' lateral: 6.6 LV SV:         41 LV SV Index:   19 LVOT Area:     3.14 cm  RIGHT VENTRICLE RV Basal diam:  3.60 cm RV Mid diam:    2.80 cm RV S prime:     18.20 cm/s TAPSE (M-mode): 2.5 cm LEFT ATRIUM           Index        RIGHT ATRIUM           Index LA diam:      2.60 cm 1.22 cm/m   RA Area:     15.90 cm LA Vol (A2C): 61.8 ml 28.90 ml/m  RA Volume:   40.50 ml  18.94  ml/m LA Vol (A4C): 27.4 ml 12.81 ml/m  AORTIC VALVE AV Area (Vmax):    2.56 cm AV Area (Vmean):   2.11 cm AV Area (VTI):     2.41 cm AV Vmax:           97.60 cm/s AV Vmean:          72.600 cm/s AV VTI:            0.168 m AV Peak Grad:      3.8 mmHg AV Mean Grad:      2.0 mmHg LVOT Vmax:         79.60 cm/s LVOT Vmean:        48.800  cm/s LVOT VTI:          0.129 m LVOT/AV VTI ratio: 0.77  AORTA Ao Root diam: 3.70 cm MITRAL VALVE               TRICUSPID VALVE MV Area (PHT): 3.05 cm    TR Peak grad:   10.5 mmHg MV Area VTI:   2.28 cm    TR Vmax:        162.00 cm/s MV Peak grad:  3.0 mmHg MV Mean grad:  1.0 mmHg    SHUNTS MV Vmax:       0.86 m/s    Systemic VTI:  0.13 m MV Vmean:      56.4 cm/s   Systemic Diam: 2.00 cm MV Decel Time: 249 msec MV E velocity: 47.10 cm/s MV A velocity: 68.10 cm/s MV E/A ratio:  0.69 Keller Paterson Electronically signed by Keller Paterson Signature Date/Time: 06/25/2024/1:08:46 PM    Final    CT ANGIO HEAD NECK W WO CM Result Date: 06/24/2024 CLINICAL DATA:  Neuro deficit, acute, stroke suspected EXAM: CT ANGIOGRAPHY HEAD AND NECK WITH AND WITHOUT CONTRAST TECHNIQUE: Multidetector CT imaging of the head and neck was performed using the standard protocol during bolus administration of intravenous contrast. Multiplanar CT image reconstructions and MIPs were obtained to evaluate the vascular anatomy. Carotid stenosis measurements (when applicable) are obtained utilizing NASCET criteria, using the distal internal carotid diameter as the denominator. RADIATION DOSE REDUCTION: This exam was performed according to the departmental dose-optimization program which includes automated exposure control, adjustment of the mA and/or kV according to patient size and/or use of iterative reconstruction technique. CONTRAST:  75mL OMNIPAQUE  IOHEXOL  350 MG/ML SOLN COMPARISON:  MRI of the head dated June 23, 2024. FINDINGS: CT HEAD FINDINGS Brain: There is a moderate cerebral and cerebellar volume loss.  There is an of all vein lacunar infarct within the right lateral thalamus. There is moderate periventricular and deep cerebral white matter disease. No evidence of hemorrhage, mass or hydrocephalus. Vascular: Moderate vascular calcifications. Skull: Intact and unremarkable. Sinuses/Orbits: Clear paranasal sinuses.  Normal orbits. Other: None. Review of the MIP images confirms the above findings CTA NECK FINDINGS Aortic arch: Moderate calcific atheromatous disease. Right carotid system: The common carotid artery is normal in caliber. There is mild calcific plaque within the origin of the internal carotid artery with no flow limiting stenosis. Left carotid system: The common carotid artery is normal in caliber. There is minimal calcific plaque within the internal carotid artery. There is no significant stenosis. Estimated stenosis is less than 10%. Vertebral arteries: The vertebral arteries are hypoplastic bilaterally. The left vertebral artery is relatively dominant. Skeleton: Moderate degenerative changes throughout the cervical spine. Other neck: Negative. Upper chest: Mild paraseptal blebs. Review of the MIP images confirms the above findings CTA HEAD FINDINGS Anterior circulation: Calcific plaque within the carotid siphons with no flow limiting stenosis. The anterior and middle cerebral arteries and their proximal branches are normal in caliber. No evidence of aneurysm, flow-limiting stenosis or vascular occlusion. There are fetal type origins of both posterior cerebral arteries. Posterior circulation: The basilar artery is diminutive secondary to fetal type origins of the posterior cerebral arteries. The cerebellar arteries are patent. The posterior cerebral arteries are normal in caliber. Venous sinuses: Patent. Anatomic variants: Fetal type origins of the posterior cerebral arteries with diminutive vertebrobasilar system. Review of the MIP images confirms the above findings IMPRESSION: 1. Fetal type origin of  the posterior cerebral arteries with diminutive vertebrobasilar system.  There is no flow-limiting stenosis evident. 2. Evolving acute lacunar infarct within the right thalamus. 3. Moderately advanced cerebral white matter disease. 4. Emphysema. Electronically Signed   By: Evalene Coho M.D.   On: 06/24/2024 19:44   MR BRAIN W WO CONTRAST Addendum Date: 06/23/2024 ADDENDUM REPORT: 06/23/2024 15:12 ADDENDUM: These results were called by telephone at the time of interpretation on 06/23/2024 at 3:11 pm to provider PA LAMAR MANUS , who verbally acknowledged these results. Electronically Signed   By: Evalene Coho M.D.   On: 06/23/2024 15:12   Result Date: 06/23/2024 CLINICAL DATA:  Increased leg weakness.  Possible stroke. EXAM: MRI HEAD WITHOUT AND WITH CONTRAST TECHNIQUE: Multiplanar, multiecho pulse sequences of the brain and surrounding structures were obtained without and with intravenous contrast. CONTRAST:  9mL GADAVIST  GADOBUTROL  1 MMOL/ML IV SOLN COMPARISON:  CT of the head dated June 22, 2024 and MRI of the head dated February 13, 2018. FINDINGS: Brain: There is restricted diffusion present within the right thalamus representing acute nonhemorrhagic lacunar infarct. There is no restricted diffusion present elsewhere throughout the brain. There is age-related cerebral and cerebellar volume loss present. There is moderate diffuse cerebral white matter disease. There is no evidence of hemorrhage, mass or hydrocephalus. There is no abnormal parenchymal or meningeal enhancement. Vascular: Normal flow voids. Skull and upper cervical spine: Normal marrow signal. No osseous lesions. Sinuses/Orbits: Clear paranasal sinuses.  Normal orbits. Other: None. IMPRESSION: 1. Acute lacunar infarct within the right thalamus. 2. Age-related atrophy and moderate diffuse cerebral white matter disease. Electronically Signed: By: Evalene Coho M.D. On: 06/23/2024 14:39   CT Head Wo Contrast Result Date:  06/22/2024 CLINICAL DATA:  Transient ischemic attack. History of dementia. Difficulty with balance around lunchtime today also with numbness in right leg and lower back pain. EXAM: CT HEAD WITHOUT CONTRAST TECHNIQUE: Contiguous axial images were obtained from the base of the skull through the vertex without intravenous contrast. RADIATION DOSE REDUCTION: This exam was performed according to the departmental dose-optimization program which includes automated exposure control, adjustment of the mA and/or kV according to patient size and/or use of iterative reconstruction technique. COMPARISON:  MRI head 02/13/2018. FINDINGS: Brain: No acute intracranial hemorrhage. No CT evidence of acute infarct. Nonspecific hypoattenuation in the periventricular and subcortical white matter favored to reflect chronic microvascular ischemic changes. Mild parenchymal volume loss with slight temporal lobe predominance. Small remote infarct in the right cerebellum. No edema, mass effect, or midline shift. The basilar cisterns are patent. Ventricles: The ventricles are normal. Vascular: No hyperdense vessel or unexpected calcification. Skull: No acute or aggressive finding. Orbits: Orbits are symmetric. Sinuses: Mucosal thickening in the alveolar recess of the left maxillary sinus which may be odontogenic in origin. Additional mild mucosal thickening in the left sphenoid sinus. Other: Mastoid air cells are clear. IMPRESSION: No CT evidence of acute intracranial abnormality. Chronic microvascular ischemic changes and mild parenchymal volume loss. Small remote infarct in the right cerebellum. Electronically Signed   By: Donnice Mania M.D.   On: 06/22/2024 18:42    Microbiology: Results for orders placed or performed during the hospital encounter of 03/07/16  Surgical pcr screen     Status: Abnormal   Collection Time: 03/07/16 10:26 AM   Specimen: Nasal Mucosa; Nasal Swab  Result Value Ref Range Status   MRSA, PCR NEGATIVE NEGATIVE  Final   Staphylococcus aureus POSITIVE (A) NEGATIVE Final    Comment:        The Xpert SA Assay (FDA approved for  NASAL specimens in patients over 20 years of age), is one component of a comprehensive surveillance program.  Test performance has been validated by [facility] for patients greater than or equal to 30 year old. It is not intended to diagnose infection nor to guide or monitor treatment.     Labs: CBC: Recent Labs  Lab 06/22/24 1603 06/23/24 1545  WBC 6.8 6.4  NEUTROABS 3.3 3.3  HGB 10.9* 11.3*  HCT 34.4* 35.4*  MCV 96.4 95.2  PLT 276 274   Basic Metabolic Panel: Recent Labs  Lab 06/22/24 1603 06/23/24 1545  NA 141 141  K 3.6 3.7  CL 105 109  CO2 26 22  GLUCOSE 89 105*  BUN 23 24*  CREATININE 1.22 0.96  CALCIUM  8.8* 9.1   Liver Function Tests: Recent Labs  Lab 06/22/24 1603 06/23/24 1545  AST 23 21  ALT 26 23  ALKPHOS 66 78  BILITOT 0.5 0.4  PROT 5.9* 6.5  ALBUMIN 3.4* 3.8   CBG: No results for input(s): GLUCAP in the last 168 hours.  Discharge time spent: greater than 30 minutes.  Signed: Aimee Somerset, MD [facility] 06/26/2024

## 2024-06-26 NOTE — Plan of Care (Signed)
  Problem: Education: Goal: Knowledge of secondary prevention will improve (MUST DOCUMENT ALL) Outcome: Progressing   Problem: Ischemic Stroke/TIA Tissue Perfusion: Goal: Complications of ischemic stroke/TIA will be minimized Outcome: Progressing   Problem: Coping: Goal: Will verbalize positive feelings about self Outcome: Progressing Goal: Will identify appropriate support needs Outcome: Progressing   Problem: Health Behavior/Discharge Planning: Goal: Goals will be collaboratively established with patient/family Outcome: Progressing   Problem: Self-Care: Goal: Ability to participate in self-care as condition permits will improve Outcome: Progressing Goal: Verbalization of feelings and concerns over difficulty with self-care will improve Outcome: Progressing Goal: Ability to communicate needs accurately will improve Outcome: Progressing   Problem: Nutrition: Goal: Risk of aspiration will decrease Outcome: Progressing Goal: Dietary intake will improve Outcome: Progressing   Problem: Education: Goal: Knowledge of disease or condition will improve Outcome: Not Progressing Goal: Knowledge of patient specific risk factors will improve (DELETE if not current risk factor) Outcome: Not Progressing   Problem: Health Behavior/Discharge Planning: Goal: Ability to manage health-related needs will improve Outcome: Not Progressing   Problem: Education: Goal: Knowledge of General Education information will improve Description: Including pain rating scale, medication(s)/side effects and non-pharmacologic comfort measures Outcome: Not Progressing

## 2024-06-26 NOTE — TOC Progression Note (Addendum)
 Transition of Care (TOC) - Progression Note    Patient Details  Name: Albert [name] MRN: [id number] Date of Birth: 06-25-1946  Transition of Care [facility]) CM/SW Contact  Elouise LULLA Capri, RN Phone Number: 06/26/2024, 9:02 AM  Clinical Narrative:     Alert from PT Damien, confirming PT visit with patient today pending.   Per PT, home health PT recommended. CM call to [facility](preferred [facility]). CM spoke to [place]. [facility] has accepted patient for services.   Expected Discharge Plan: Home w Home Health Services Barriers to Discharge: Continued Medical Work up  Expected Discharge Plan and Services    HH vs SNF   Living arrangements for the past 2 months: Apartment                  Social Determinants of Health (SDOH) Interventions SDOH Screenings   Food Insecurity: Patient Unable To Answer (06/24/2024)  Housing: Patient Unable To Answer (06/24/2024)  Transportation Needs: Patient Unable To Answer (06/24/2024)  Utilities: Patient Unable To Answer (06/24/2024)  Financial Resource Strain: Patient Declined (06/12/2024)   Received from [facility]  Social Connections: Unknown (06/24/2024)  Tobacco Use: High Risk (06/23/2024)    Readmission Risk Interventions     No data to display

## 2024-06-26 NOTE — TOC Transition Note (Signed)
 Transition of Care [facility]) - Discharge Note   Patient Details  Name: Albert [name] MRN: [id number] Date of Birth: 11-15-1946  Transition of Care [facility]) CM/SW Contact:  Elouise LULLA Capri, RN 06/26/2024, 4:13 PM   Clinical Narrative:     Discharge orders noted. [facility] following for HHPT/OT. CM call to patient's room regarding pending discharge. CM spoke to patient's wife, Albert [name] regarding [facility] accepting patient for HHPT/OT. Patient's wife verbalized understanding and agreement. Private transportation arranged.   Final next level of care: Home w Home Health Services Barriers to Discharge: No Barriers Identified   Patient Goals and CMS Choice    Home health PT/OT--[facility]   Discharge Placement               Home health PT/OT   Discharge Plan and Services Additional resources added to the After Visit Summary for      [facility] Arranged: PT HH Agency: [facility] Date [facility] Agency Contacted: 06/26/24 Time HH Agency Contacted: [phone] Representative spoke with at [facility] Agency: [place]  Social Drivers of Health (SDOH) Interventions SDOH Screenings   Food Insecurity: Patient Unable To Answer (06/24/2024)  Housing: Patient Unable To Answer (06/24/2024)  Transportation Needs: Patient Unable To Answer (06/24/2024)  Utilities: Patient Unable To Answer (06/24/2024)  Financial Resource Strain: Patient Declined (06/12/2024)   Received from [facility]  Social Connections: Unknown (06/24/2024)  Tobacco Use: High Risk (06/23/2024)     Readmission Risk Interventions     No data to display

## 2024-06-26 NOTE — Plan of Care (Signed)
  Problem: Ischemic Stroke/TIA Tissue Perfusion: Goal: Complications of ischemic stroke/TIA will be minimized 06/26/2024 [id number] by Roark Selinda CROME, RN Outcome: Progressing 06/26/2024 0521 by Roark Selinda CROME, RN Outcome: Progressing   Problem: Coping: Goal: Will verbalize positive feelings about self Outcome: Progressing   Problem: Self-Care: Goal: Ability to participate in self-care as condition permits will improve Outcome: Progressing   Problem: Nutrition: Goal: Risk of aspiration will decrease Outcome: Progressing   Problem: Clinical Measurements: Goal: Ability to maintain clinical measurements within normal limits will improve Outcome: Progressing   Problem: Activity: Goal: Risk for activity intolerance will decrease Outcome: Progressing   Problem: Nutrition: Goal: Adequate nutrition will be maintained Outcome: Progressing   Problem: Elimination: Goal: Will not experience complications related to bowel motility Outcome: Progressing

## 2024-06-26 NOTE — Progress Notes (Signed)
 Progress Note   Patient: Albert [name] Albert [name] FMW:[id number] DOB: Apr 25, 1946 DOA: 06/23/2024     0 DOS: the patient was seen and examined on 06/26/2024   Brief hospital course:  Albert [name] Albert [name], Albert [name] 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   Vitals in the ED showed T of 98.7, rr of 18, hr 88, blood pressure 131/89, SpO2 94% on room air.   Serum sodium is 141, potassium 3.7, chloride 109, bicarb 22, BUN of 24, serum creatinine 0.96, EGFR greater than 60, nonfasting blood glucose 105, WBC 6.4, hemoglobin 11.3, platelets of 274.   Outpatient MRI brain w wo contrast: Read as acute lacunar infarct within the right thalamus.   Assessment and Plan:   # Acute lacunar stroke MRI: Right thalamus lacunar infarct Patient was already on aspirin  and Plavix  which has been continued Fasting lipid LDL 61 below goal <70 and TSH 1.6 and HbA1c 5.2 wnl Frequent neuro vascular checks Appreciate PT, OT, SLP input Fall precaution TTE LVEF 65 to 70%, grade 1 diastolic dysfunction, no any other abnormal findings.  Negative PFO CTA head and neck negative for any large vessel occlusion. Neurology recommended to continue aspirin  Plavix  and Crestor  home dose.  Follow-up with neurology as an outpatient.  No need for Zio patch. Physical therapy recommends home with home health but patient's wife states she is unable to take care of him at home and is requesting skilled nursing facility for subacute rehab.      # HTN, goal below 140/90 Continue amlodipine    # Dementia arising in the senium and presenium (HCC) Home donepezil  10 mg nightly resumed   # Anxiety Home duloxetine  60 mg daily resumed   # Chronic backache and osteoarthritis Started Tylenol  650 mg p.o. twice daily scheduled and  tramadol  25 mg p.o. twice daily as needed for severe pain   # Sundowning reported by patient's wife Started Seroquel  25 mg every evening 7/10 increased Seroquel  50 mg every evening     Body mass index is 26 kg/m.  Interventions:       Subjective: No new complaints.  No events overnight  Physical Exam: Vitals:   06/25/24 1137 06/25/24 1627 06/26/24 0034 06/26/24 0825  BP: (!) 153/87 (!) 144/70 (!) 154/98 131/79  Pulse: 66 74 88 84  Resp:  16 18 18   Temp: 97.7 F (36.5 C)  98.5 F (36.9 C) 97.6 F (36.4 C)  TempSrc:    Oral  SpO2: 97% 97% 94% 100%  Weight:      Height:       General: NAD, lying comfortably Appear in no distress, affect appropriate Eyes: PERRLA ENT: Oral Mucosa Clear, moist  Neck: no JVD,  Cardiovascular: S1 and S2 Present, no Murmur,  Respiratory: good respiratory effort, Bilateral Air entry , no Crackles, no wheezes Abdomen: Bowel Sound present, Soft and no tenderness,  Skin: no rashes Extremities: no Pedal edema, no calf tenderness Neurologic: without any new focal findings Gait not checked due to patient safety concerns    Data Reviewed: No new labs  Family Communication: Plan of care was discussed with patient's wife at the bedside.  She verbalizes understanding agrees with the plan. Disposition: Status is: Observation The patient remains OBS appropriate and will d/c before 2 midnights.  Planned Discharge Destination: Skilled nursing facility  Time spent: 35 minutes  Author: Aimee Somerset, MD 06/26/2024 12:53 PM  For on call review [email or web address].

## 2024-07-28 ENCOUNTER — Other Ambulatory Visit: Payer: Self-pay | Admitting: Orthopedic Surgery

## 2024-07-28 ENCOUNTER — Encounter: Payer: Self-pay | Admitting: Orthopedic Surgery

## 2024-07-28 DIAGNOSIS — M5136 Other intervertebral disc degeneration, lumbar region with discogenic back pain only: Secondary | ICD-10-CM

## 2024-07-28 DIAGNOSIS — M545 Low back pain, unspecified: Secondary | ICD-10-CM

## 2024-07-28 DIAGNOSIS — M47816 Spondylosis without myelopathy or radiculopathy, lumbar region: Secondary | ICD-10-CM

## 2024-07-28 DIAGNOSIS — Z981 Arthrodesis status: Secondary | ICD-10-CM

## 2024-07-30 ENCOUNTER — Ambulatory Visit
Admission: RE | Admit: 2024-07-30 | Discharge: 2024-07-30 | Disposition: A | Discharge status: Home / Self Care | Source: Ambulatory Visit | Attending: Orthopedic Surgery | Admitting: Orthopedic Surgery

## 2024-07-30 DIAGNOSIS — M5136 Other intervertebral disc degeneration, lumbar region with discogenic back pain only: Secondary | ICD-10-CM | POA: Insufficient documentation

## 2024-07-30 DIAGNOSIS — M545 Low back pain, unspecified: Secondary | ICD-10-CM | POA: Insufficient documentation

## 2024-07-30 DIAGNOSIS — M47816 Spondylosis without myelopathy or radiculopathy, lumbar region: Secondary | ICD-10-CM | POA: Diagnosis present

## 2024-07-30 DIAGNOSIS — Z981 Arthrodesis status: Secondary | ICD-10-CM | POA: Diagnosis present

## 2024-08-28 ENCOUNTER — Telehealth (INDEPENDENT_AMBULATORY_CARE_PROVIDER_SITE_OTHER): Payer: Self-pay | Admitting: Vascular Surgery

## 2024-08-28 ENCOUNTER — Ambulatory Visit (INDEPENDENT_AMBULATORY_CARE_PROVIDER_SITE_OTHER): Admitting: Vascular Surgery

## 2024-08-28 ENCOUNTER — Encounter (INDEPENDENT_AMBULATORY_CARE_PROVIDER_SITE_OTHER): Payer: Self-pay | Admitting: Vascular Surgery

## 2024-08-28 VITALS — BP 140/92 | HR 80 | Wt 195.0 lb

## 2024-08-28 DIAGNOSIS — I7143 Infrarenal abdominal aortic aneurysm, without rupture: Secondary | ICD-10-CM

## 2024-08-28 DIAGNOSIS — I10 Essential (primary) hypertension: Secondary | ICD-10-CM

## 2024-08-28 DIAGNOSIS — I7133 Infrarenal abdominal aortic aneurysm, ruptured: Secondary | ICD-10-CM

## 2024-08-28 DIAGNOSIS — E7800 Pure hypercholesterolemia, unspecified: Secondary | ICD-10-CM | POA: Diagnosis not present

## 2024-08-28 DIAGNOSIS — I714 Abdominal aortic aneurysm, without rupture, unspecified: Secondary | ICD-10-CM | POA: Insufficient documentation

## 2024-08-28 NOTE — Patient Instructions (Signed)
Abdominal Aortic Aneurysm  An aneurysm is a bulge in an artery. An artery is a blood vessel that carries blood away from your heart. An abdominal aortic aneurysm happens in the main artery (aorta). Most aneurysms do not cause symptoms. But some can cause problems if they burst or tear. This can cause you to bleed inside your body. It is an emergency. It should be treated right away. What are the causes? The exact cause of an aneurysm is not known. What increases the risk? Being male and 60 years of age or older. Being Caucasian. Smoking. Being very overweight. Having: Family members who have had this condition. An injury to your main artery. Hardened arteries. Irritation and swelling of the walls of an artery. Certain genetic problems. An infection in the wall of the main artery. High cholesterol. High blood pressure. What are the signs or symptoms? You may not have any symptoms. If you do, you may have: Very bad pain in your side, back, or belly (abdomen). A feeling of fullness after you eat only a little bit of food. A lump in your belly that throbs. Problems with your feet or toes, like: Pain. Skin turning blue. Sores. Trouble pooping (constipation). Trouble peeing (urinating). If your aneurysm bursts, you may: Feel sudden, very bad pain in your belly, side, or back. Vomit or feel like you may vomit. Feel light-headed. Faint. How is this treated? Treatment depends on: Your age. How big your aneurysm is. How fast it is growing. How likely it is to burst. If the aneurysm is smaller than 2 inches (5 cm), your doctor may: Keep an eye on it to see if it gets bigger. You may need to have testing done every 6-12 months, every year, or every few years. Give you medicines for: High blood pressure. Pain. Infection. If the aneurysm is bigger than 2 inches (5 cm), you may need surgery. Follow these instructions at home: Eating and drinking  Eat foods that are good for your  heart. Eat a lot of: Fresh fruits and vegetables. Whole grains. Low-fat (lean) protein. Low-fat dairy products. Stay away from foods that are high in saturated fat and cholesterol. These include red meat and some dairy products. Lifestyle  Do not smoke or use any products that contain nicotine or tobacco. If you need help quitting, ask your doctor. Check your blood pressure often. Follow instructions from your doctor on how to keep it at a normal level. Have your cholesterol levels checked often. Follow instructions from your doctor on how to keep it at the right levels. Stay active. Get exercise. Ask your doctor how often to exercise. Ask what types of exercise are safe for you. Stay at a healthy weight. Alcohol use Do not drink alcohol if: Your doctor tells you not to drink. You are pregnant, may be pregnant, or plan to become pregnant. If you drink alcohol: Limit how much you have to: 0-1 drink a day if you are male. 0-2 drinks a day if you are male. Know how much alcohol is in your drink. In the [place] one drink is one 12 oz bottle of beer (355 mL), one 5 oz glass of wine (148 mL), or one 1 oz glass of hard liquor (44 mL). General instructions Take over-the-counter and prescription medicines only as told by your doctor. You may have to avoid lifting. Ask your doctor how much you can safely lift. If you can, learn your family's health history. Keep all follow-up visits. Your doctor will need   to keep an eye on your aneurysm and check how fast it is growing. Where to find more information American Heart Association: heart.org Contact a doctor if: Your belly, side, or back hurts. Your belly throbs. Your heart beats fast when you stand. You feel like you may vomit, or you vomit. You have trouble pooping. You have trouble peeing. You have a fever. Get help right away if: You have sudden, bad pain in your belly, side, or back. You feel light-headed, or you faint. You have  sweaty skin that is cold to the touch (clammy). You are short of breath. These symptoms may be an emergency. Get help right away. Call 911. Do not wait to see if the symptoms will go away. Do not drive yourself to the hospital. This information is not intended to replace advice given to you by your health care provider. Make sure you discuss any questions you have with your health care provider. Document Revised: 07/10/2022 Document Reviewed: 07/10/2022 Elsevier Patient Education  2024 [employer].

## 2024-08-28 NOTE — Assessment & Plan Note (Signed)
 lipid control important in reducing the progression of atherosclerotic disease. Continue statin therapy

## 2024-08-28 NOTE — Telephone Encounter (Signed)
 Called pt to give information regarding getting CT scheduled. Pt was not available and wife wanted to take the information. I advised to call radiology scheduling at [phone] and schedule the CT and then call the office back to schedule a CT results appt with Dr. Marea. Wife acknowledged and stated that after the appt was scheduled, they would call back to the office.  CT results - see JD

## 2024-08-28 NOTE — Assessment & Plan Note (Signed)
 He recently underwent an MRI of the back which I have independently reviewed.  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   MRI is a notoriously inaccurate way of measuring aneurysms.  It tends to overestimate the size of the aneurysm, but obviously if this is approaching 5 cm it we will need to have more detailed evaluation.  I will order a CT angiogram of the abdomen pelvis for further evaluation of his aneurysm and determine whether or not this has reached the 5 cm threshold for consideration for prophylactic repair, and if it would be a candidate for a stent graft repair.  I discussed the pathophysiology and natural history of abdominal aortic aneurysm.  I discussed the reason and rationale for treatment to avoid lethal rupture.  I will see him back following the CT angiogram.

## 2024-08-28 NOTE — Progress Notes (Addendum)
 Patient ID: Albert [name], male   DOB: 07-Jan-1946, 78 y.o.   MRN: [id number]  Chief Complaint  Patient presents with   Establish Care    HPI Albert [name] is a 78 y.o. male.  I am asked to see the patient by Debby Amber for evaluation of an abdominal aortic aneurysm.  The patient and his wife have known about this since his back surgery back in 2017.  He has not had any obvious aneurysm related symptoms. Specifically, the patient denies new back or abdominal pain, or signs of peripheral embolization.  He recently underwent an MRI of the back which I have independently reviewed.  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  As such, he is referred for further evaluation and treatment.  Back in 2017, this was just over 3 cm in diameter.   Past Medical History:  Diagnosis Date   Anxiety    takes Xanax  daily as needed   Arthritis    knees   Back pain    Barrett esophagus    Chronic back pain    Dementia (HCC)    arising in the senium and presenium   Displaced fracture of proximal phalanx of right lesser toe(s), initial encounter for closed fracture    Diverticulosis    GERD (gastroesophageal reflux disease)    OTC meds as needed   Headache    History of colon polyps    benign    History of kidney stones    History of migraine    takes Imitrex  daily as needed.Last one 2 wks ago   Hyperlipidemia    hx of-has been off of meds for about a yr   Hypertension    Insomnia    only recent d/t pain   Joint pain    Nephrolithiasis    Pre-diabetes    Stroke [facility])    without residual deficits    Past Surgical History:  Procedure Laterality Date   AMPUTATION TOE Right 04/13/2022   Procedure: 28820 - AMPUTATION TOE;  Surgeon: Ashley Soulier, DPM;  Location: [facility] ORS;  Service: Podiatry;  Laterality: Right;   ANKLE ARTHROSCOPY Right 04/13/2022   Procedure: A-SCOPE/OCD REPAIR;  Surgeon: Ashley Soulier, DPM;   Location: [facility] OR[facility];  Service: Podiatry;  Laterality: Right;   ANKLE SURGERY Right 1997   ankles/screws    ARTHROSCOPIC REPAIR ACL Right 1993   BACK SURGERY  2017   titanium rods and pins in lower back   COLONOSCOPY     EPIDIDYMECTOMY Left 04/10/2017   Procedure: EPIDIDYMECTOMY;  Surgeon: Rosina Riis, MD;  Location: [facility] OR[facility];  Service: Urology;  Laterality: Left;   ESOPHAGOGASTRODUODENOSCOPY     LUMBAR FUSION  March 13, 2016   L4/L5 fusion   MINOR HARDWARE REMOVAL Right 04/13/2022   Procedure: REMOVAL SCREW; DEEP;  Surgeon: Ashley Soulier, DPM;  Location: [facility];  Service: Podiatry;  Laterality: Right;   TONSILLECTOMY  1962   adenoidectomy    VASECTOMY  1973   WOUND DEBRIDEMENT Right 04/13/2022   Procedure: 29906 - SUBTALAR ARTHROSCOPY;  Surgeon: Ashley Soulier, DPM;  Location: [facility];  Service: Podiatry;  Laterality: Right;     Family History  Problem Relation Age of Onset   Arthritis Mother    Esophageal cancer Father    Prostate cancer Neg Hx    Bladder Cancer Neg Hx    Kidney cancer Neg Hx  Social History   Tobacco Use   Smoking status: Every Day    Current packs/day: 0.50    Average packs/day: 0.5 packs/day for 50.0 years (25.0 ttl pk-yrs)    Types: Cigarettes   Smokeless tobacco: Never   Tobacco comments:    Half a pack a day, per wife.  Vaping Use   Vaping status: Never Used  Substance Use Topics   Alcohol use: No   Drug use: No     Allergies  Allergen Reactions   Codeine Nausea And Vomiting    Current Outpatient Medications  Medication Sig Dispense Refill   acetaminophen  (TYLENOL ) 325 MG tablet Take 650 mg by mouth every 8 (eight) hours as needed for moderate pain.     ALPRAZolam  (XANAX ) 0.25 MG tablet Take 0.25 mg by mouth at bedtime as needed for anxiety.     amLODipine  (NORVASC ) 10 MG tablet Take 10 mg by mouth daily.     aspirin  EC 81 MG tablet Take 81 mg by mouth daily.     clopidogrel  (PLAVIX ) 75 MG tablet 75 mg daily.      cyanocobalamin  (VITAMIN B12) 1000 MCG tablet Take 1,000 mcg by mouth.  Take 1,000 mcg by mouth once daily     donepezil  (ARICEPT ) 10 MG tablet Take 10 mg by mouth.  TAKE 1 TABLET(10 MG) BY MOUTH AT BEDTIME     DULoxetine  (CYMBALTA ) 60 MG capsule Take 60 mg by mouth daily.     ferrous sulfate  325 (65 FE) MG tablet Take 325 mg by mouth.  Take 325 mg by mouth daily with breakfast     Multiple Vitamin (MULTIVITAMIN WITH MINERALS) TABS tablet Take 1 tablet by mouth daily.     naphazoline-pheniramine (NAPHCON-A) 0.025-0.3 % ophthalmic solution Place 1 drop into both eyes 4 (four) times daily as needed for eye irritation.     QUEtiapine  (SEROQUEL ) 25 MG tablet Take 25 mg by mouth at bedtime.     rosuvastatin  (CRESTOR ) 20 MG tablet Take 20 mg by mouth daily.     trolamine salicylate (ASPERCREME) 10 % cream Apply 1 application topically as needed for muscle pain.     No current facility-administered medications for this visit.      REVIEW OF SYSTEMS (Negative unless checked)  Constitutional: [] Weight loss  [] Fever  [] Chills Cardiac: [] Chest pain   [] Chest pressure   [] Palpitations   [] Shortness of breath when laying flat   [] Shortness of breath at rest   [] Shortness of breath with exertion. Vascular:  [] Pain in legs with walking   [] Pain in legs at rest   [] Pain in legs when laying flat   [] Claudication   [] Pain in feet when walking  [] Pain in feet at rest  [] Pain in feet when laying flat   [] History of DVT   [] Phlebitis   [] Swelling in legs   [] Varicose veins   [] Non-healing ulcers Pulmonary:   [] Uses home oxygen   [] Productive cough   [] Hemoptysis   [] Wheeze  [] COPD   [] Asthma Neurologic:  [] Dizziness  [] Blackouts   [] Seizures   [x] History of stroke   [] History of TIA  [] Aphasia   [] Temporary blindness   [] Dysphagia   [] Weakness or numbness in arms   [x] Weakness or numbness in legs Musculoskeletal:  [x] Arthritis   [] Joint swelling   [] Joint pain   [x] Low back pain Hematologic:  [] Easy bruising   [] Easy bleeding   [] Hypercoagulable state   [] Anemic  [] Hepatitis Gastrointestinal:  [] Blood in stool   [] Vomiting blood  [x] Gastroesophageal reflux/heartburn   []   Abdominal pain Genitourinary:  [] Chronic kidney disease   [] Difficult urination  [] Frequent urination  [] Burning with urination   [] Hematuria Skin:  [] Rashes   [] Ulcers   [] Wounds Psychological:  [x] History of anxiety   []  History of major depression.    Physical Exam BP (!) 140/92   Pulse 80   Wt 195 lb (88.5 kg)   BMI 25.73 kg/m  Gen:  WD/WN, NAD Head: [place]/AT, No temporalis wasting.  Ear/Nose/Throat: Hearing grossly intact, nares w/o erythema or drainage, oropharynx w/o Erythema/Exudate Eyes: Conjunctiva clear, sclera non-icteric  Neck: trachea midline.  No JVD.  Pulmonary:  Good air movement, respirations not labored, no use of accessory muscles  Cardiac: RRR, no JVD Vascular:  Vessel Right Left  Radial Palpable Palpable                                   Gastrointestinal:. No masses, surgical incisions, or scars. Aorta not easily palpable Musculoskeletal: M/S 5/5 throughout.  Extremities without ischemic changes.  No deformity or atrophy. Trace LE edema. Neurologic: Sensation grossly intact in extremities.  Symmetrical.  Speech is fluent. Motor exam as listed above. Psychiatric: Judgment intact, Mood & affect appropriate for pt's clinical situation. Dermatologic: No rashes or ulcers noted.  No cellulitis or open wounds.    Radiology CT Angio Abd/Pel w/ and/or w/o Result Date: 09/02/2024 CLINICAL DATA:  Abdominal aortic aneurysm, preoperative planning EXAM: CTA ABDOMEN AND PELVIS WITHOUT AND WITH CONTRAST TECHNIQUE: Multidetector CT imaging of the abdomen and pelvis was performed using the standard protocol during bolus administration of intravenous contrast. Multiplanar reconstructed images and MIPs were obtained and reviewed to evaluate the vascular anatomy. RADIATION DOSE REDUCTION: This exam was performed  according to the departmental dose-optimization program which includes automated exposure control, adjustment of the mA and/or kV according to patient size and/or use of iterative reconstruction technique. CONTRAST:  OMNIPAQUE  IOHEXOL  350 MG/ML SOLN COMPARISON:  None Available. FINDINGS: VASCULAR Aorta: A bilobed aneurysm of the infrarenal abdominal aorta is present which demonstrates an angulated neck. There is moderate plaque within the aneurysm as well as irregular morphology of the posterolateral wall possibly secondary to superimposed penetrating atherosclerotic ulceration. Aortic aneurysm measures approximately 5.3 x 5.2 cm using 3D measurement techniques within the infrarenal segment. Celiac: Patent. SMA: Patent. Renals: 2 right renal arteries, patent. 2 left renal arteries, patent. IMA: Small in caliber, patent. Inflow: The right iliac limb is ectatic. The right common iliac artery measures proximally 2.0 cm in largest transverse dimension. A penetrating atherosclerotic ulcer is present in the distal right common iliac artery near the origin of the internal iliac artery. The right internal iliac artery is patent. Mild atherosclerotic changes are present in the right external iliac artery. Left common iliac artery is ectatic and measures approximately 1.5 cm in diameter. Left internal iliac artery is patent. The left external iliac artery is patent and ectatic with mild diffuse atherosclerotic changes. Proximal Outflow: Bilateral common femoral arteries are patent. Veins: Nothing significant. Review of the MIP images confirms the above findings. NON-VASCULAR Lower chest: Nothing significant. Hepatobiliary: Gallbladder is decompressed and contains a gallstone. Pancreas: Nothing significant. Spleen: Within normal limits. Adrenals/Urinary Tract: A left adrenal nodule is present which measures approximately 4.2 cm with low attenuation and scattered calcification. This is decreased in size when compared to  March 07, 2015. The right adrenal gland is within normal limits. Simple cysts are present in both kidneys. Additionally, there  is a cortical density in the midpole of the right kidney which measures 1.9 cm on image 71 of CT series 4 and is not clearly a simple cyst and was not clearly seen on CT scan performed May 07, 2015. A smaller, dense cortical structure is also present in the midpole of the left kidney on image 84 of CT series 4 measuring 1.1 cm. Stomach/Bowel: No dilated loops of bowel are seen. Colonic diverticulosis. Lymphatic: No evidence of significant lymphadenopathy. Reproductive: Nothing significant. Other: Fat containing umbilical hernia. Musculoskeletal: Postsurgical changes from lumbar spine fusion. IMPRESSION: 1. Infrarenal abdominal aortic aneurysm measuring approximately 5.3 cm using 3D measurement techniques. The right posterolateral wall of the aneurysm is irregular in contour and is suspicious for underlying penetrating atherosclerotic ulcer. 2. 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 3. Cortical densities in both kidneys which are new when compared to the previous exam. Consideration should be given toward further evaluation by MRI of the abdomen and pelvis, with and without contrast to evaluate for small renal neoplasm. These results will be called to the ordering clinician or representative by the Radiologist Assistant, and communication documented in the PACS or [employer]. Electronically Signed   By: Maude Naegeli M.D.   On: 09/02/2024 09:48    Labs Recent Results (from the past 2160 hours)  Comprehensive metabolic panel     Status: Abnormal   Collection Time: 06/22/24  4:03 PM  Result Value Ref Range   Sodium 141 135 - 145 mmol/L   Potassium 3.6 3.5 - 5.1 mmol/L   Chloride 105 98 - 111 mmol/L   CO2 26 22 - 32 mmol/L   Glucose, Bld 89 70 - 99 mg/dL    Comment: Glucose reference range applies only  to samples taken after fasting for at least 8 hours.   BUN 23 8 - 23 mg/dL   Creatinine, Ser 8.77 0.61 - 1.24 mg/dL   Calcium  8.8 (L) 8.9 - 10.3 mg/dL   Total Protein 5.9 (L) 6.5 - 8.1 g/dL   Albumin 3.4 (L) 3.5 - 5.0 g/dL   AST 23 15 - 41 U/L   ALT 26 0 - 44 U/L   Alkaline Phosphatase 66 38 - 126 U/L   Total Bilirubin 0.5 0.0 - 1.2 mg/dL   GFR, Estimated >39 >39 mL/min    Comment: (NOTE) Calculated using the CKD-EPI Creatinine Equation (2021)    Anion gap 10 5 - 15    Comment: Performed at [facility], [address] [place], [place] [address]  CBC with Differential     Status: Abnormal   Collection Time: 06/22/24  4:03 PM  Result Value Ref Range   WBC 6.8 4.0 - 10.5 K/uL   RBC 3.57 (L) 4.22 - 5.81 MIL/uL   Hemoglobin 10.9 (L) 13.0 - 17.0 g/dL   HCT 65.5 (L) 60.9 - 47.9 %   MCV 96.4 80.0 - 100.0 fL   MCH 30.5 26.0 - 34.0 pg   MCHC 31.7 30.0 - 36.0 g/dL   RDW 85.0 88.4 - 84.4 %   Platelets 276 150 - 400 K/uL   nRBC 0.0 0.0 - 0.2 %   Neutrophils Relative % 48 %   Neutro Abs 3.3 1.7 - 7.7 K/uL   Lymphocytes Relative 34 %   Lymphs Abs 2.3 0.7 - 4.0 K/uL   Monocytes Relative 13 %   Monocytes Absolute 0.9 0.1 - 1.0 K/uL   Eosinophils Relative 4 %  Eosinophils Absolute 0.3 0.0 - 0.5 K/uL   Basophils Relative 1 %   Basophils Absolute 0.1 0.0 - 0.1 K/uL   Immature Granulocytes 0 %   Abs Immature Granulocytes 0.02 0.00 - 0.07 K/uL    Comment: Performed at [facility], [address] [place], [place] [address]  Troponin I (High Sensitivity)     Status: None   Collection Time: 06/22/24  4:03 PM  Result Value Ref Range   Troponin I (High Sensitivity) 11 <18 ng/L    Comment: (NOTE) Elevated high sensitivity troponin I (hsTnI) values and significant  changes across serial measurements may suggest ACS but many other  chronic and acute conditions are known to elevate hsTnI results.  Refer to the Links section for chest pain algorithms and additional   guidance. Performed at [facility], [address] [place], [place] [address]   Urinalysis, Routine w reflex microscopic -Urine, Clean Catch     Status: Abnormal   Collection Time: 06/22/24  5:25 PM  Result Value Ref Range   Color, Urine YELLOW (A) YELLOW   APPearance CLEAR (A) CLEAR   Specific Gravity, Urine 1.025 1.005 - 1.030   pH 5.0 5.0 - 8.0   Glucose, UA NEGATIVE NEGATIVE mg/dL   Hgb urine dipstick NEGATIVE NEGATIVE   Bilirubin Urine NEGATIVE NEGATIVE   Ketones, ur NEGATIVE NEGATIVE mg/dL   Protein, ur NEGATIVE NEGATIVE mg/dL   Nitrite NEGATIVE NEGATIVE   Leukocytes,Ua NEGATIVE NEGATIVE    Comment: Performed at [facility], [address] [place], [place] [address]  Urine Drug Screen, Qualitative ([facility] only)     Status: Abnormal   Collection Time: 06/22/24  5:25 PM  Result Value Ref Range   Tricyclic, Ur Screen POSITIVE (A) NONE DETECTED   Amphetamines, Ur Screen NONE DETECTED NONE DETECTED   MDMA (Ecstasy)Ur Screen NONE DETECTED NONE DETECTED   Cocaine Metabolite,Ur [place] NONE DETECTED NONE DETECTED   Opiate, Ur Screen NONE DETECTED NONE DETECTED   Phencyclidine (PCP) Ur S NONE DETECTED NONE DETECTED   Cannabinoid 50 Ng, Ur [place] NONE DETECTED NONE DETECTED   Barbiturates, Ur Screen NONE DETECTED NONE DETECTED   Benzodiazepine, Ur Scrn NONE DETECTED NONE DETECTED   Methadone Scn, Ur NONE DETECTED NONE DETECTED    Comment: (NOTE) Tricyclics + metabolites, urine    Cutoff 1000 ng/mL Amphetamines + metabolites, urine  Cutoff 1000 ng/mL MDMA (Ecstasy), urine              Cutoff 500 ng/mL Cocaine Metabolite, urine          Cutoff 300 ng/mL Opiate + metabolites, urine        Cutoff 300 ng/mL Phencyclidine (PCP), urine         Cutoff 25 ng/mL Cannabinoid, urine                 Cutoff 50 ng/mL Barbiturates + metabolites, urine  Cutoff 200 ng/mL Benzodiazepine, urine              Cutoff 200 ng/mL Methadone, urine                   Cutoff 300 ng/mL  The  urine drug screen provides only a preliminary, unconfirmed analytical test result and should not be used for non-medical purposes. Clinical consideration and professional judgment should be applied to any positive drug screen result due to possible interfering substances. A more specific alternate chemical method must be used in order to obtain a confirmed analytical result.  Gas chromatography / mass spectrometry (GC/MS) is the preferred confirm atory method. Performed at [facility], [address] [place], [place] [address]   CBC with Differential     Status: Abnormal   Collection Time: 06/23/24  3:45 PM  Result Value Ref Range   WBC 6.4 4.0 - 10.5 K/uL   RBC 3.72 (L) 4.22 - 5.81 MIL/uL   Hemoglobin 11.3 (L) 13.0 - 17.0 g/dL   HCT 64.5 (L) 60.9 - 47.9 %   MCV 95.2 80.0 - 100.0 fL   MCH 30.4 26.0 - 34.0 pg   MCHC 31.9 30.0 - 36.0 g/dL   RDW 85.0 88.4 - 84.4 %   Platelets 274 150 - 400 K/uL   nRBC 0.0 0.0 - 0.2 %   Neutrophils Relative % 52 %   Neutro Abs 3.3 1.7 - 7.7 K/uL   Lymphocytes Relative 33 %   Lymphs Abs 2.1 0.7 - 4.0 K/uL   Monocytes Relative 11 %   Monocytes Absolute 0.7 0.1 - 1.0 K/uL   Eosinophils Relative 3 %   Eosinophils Absolute 0.2 0.0 - 0.5 K/uL   Basophils Relative 1 %   Basophils Absolute 0.0 0.0 - 0.1 K/uL   Immature Granulocytes 0 %   Abs Immature Granulocytes 0.02 0.00 - 0.07 K/uL    Comment: Performed at [facility], [address] [place], [place] [address]  Comprehensive metabolic panel     Status: Abnormal   Collection Time: 06/23/24  3:45 PM  Result Value Ref Range   Sodium 141 135 - 145 mmol/L   Potassium 3.7 3.5 - 5.1 mmol/L   Chloride 109 98 - 111 mmol/L   CO2 22 22 - 32 mmol/L   Glucose, Bld 105 (H) 70 - 99 mg/dL    Comment: Glucose reference range applies only to samples taken after fasting for at least 8 hours.   BUN 24 (H) 8 - 23 mg/dL   Creatinine, Ser 9.03 0.61 - 1.24 mg/dL   Calcium  9.1 8.9 - 10.3 mg/dL    Total Protein 6.5 6.5 - 8.1 g/dL   Albumin 3.8 3.5 - 5.0 g/dL   AST 21 15 - 41 U/L   ALT 23 0 - 44 U/L   Alkaline Phosphatase 78 38 - 126 U/L   Total Bilirubin 0.4 0.0 - 1.2 mg/dL   GFR, Estimated >39 >39 mL/min    Comment: (NOTE) Calculated using the CKD-EPI Creatinine Equation (2021)    Anion gap 10 5 - 15    Comment: Performed at [facility], [address] [place], [place] [address]  Protime-INR     Status: None   Collection Time: 06/23/24  3:45 PM  Result Value Ref Range   Prothrombin Time 14.4 11.4 - 15.2 seconds   INR 1.1 0.8 - 1.2    Comment: (NOTE) INR goal varies based on device and disease states. Performed at [facility], [address] [place], [place] [address]   APTT     Status: None   Collection Time: 06/23/24  3:45 PM  Result Value Ref Range   aPTT 31 24 - 36 seconds    Comment: Performed at [facility], [address] [place], [place] [address]  TSH     Status: None   Collection Time: 06/24/24  5:28 AM  Result Value Ref Range   TSH 1.627 0.350 - 4.500 uIU/mL    Comment: Performed by a 3rd Generation assay with a functional sensitivity of <=0.01 uIU/mL.  Performed at [facility], [address] [place], [place] [address]   Iron and TIBC     Status: Abnormal   Collection Time: 06/24/24  5:28 AM  Result Value Ref Range   Iron 41 (L) 45 - 182 ug/dL   TIBC 653 749 - 549 ug/dL   Saturation Ratios 12 (L) 17.9 - 39.5 %   UIBC 305 ug/dL    Comment: Performed at [facility], [address] [place], [place] [address]  Folate     Status: None   Collection Time: 06/24/24  5:28 AM  Result Value Ref Range   Folate 35.0 >5.9 ng/mL    Comment: Performed at [facility], [address] [place], [place] [address]  Lipid panel     Status: Abnormal   Collection Time: 06/24/24  5:29 AM  Result Value Ref Range   Cholesterol 117 0 - 200 mg/dL   Triglycerides 888 <849 mg/dL   HDL 34 (L) >59 mg/dL    Total CHOL/HDL Ratio 3.4 RATIO   VLDL 22 0 - 40 mg/dL   LDL Cholesterol 61 0 - 99 mg/dL    Comment:        Total Cholesterol/HDL:CHD Risk Coronary Heart Disease Risk Table                     Men   Women  1/2 Average Risk   3.4   3.3  Average Risk       5.0   4.4  2 X Average Risk   9.6   7.1  3 X Average Risk  23.4   11.0        Use the calculated Patient Ratio above and the CHD Risk Table to determine the patient's CHD Risk.        ATP III CLASSIFICATION (LDL):  <100     mg/dL   Optimal  899-870  mg/dL   Near or Above                    Optimal  130-159  mg/dL   Borderline  839-810  mg/dL   High  >809     mg/dL   Very High Performed at [facility], [address] [place], [place] [address]   Hemoglobin A1c     Status: None   Collection Time: 06/24/24  5:29 AM  Result Value Ref Range   Hgb A1c MFr Bld 5.2 4.8 - 5.6 %    Comment: (NOTE)         Prediabetes: 5.7 - 6.4         Diabetes: >6.4         Glycemic control for adults with diabetes: <7.0    Mean Plasma Glucose 103 mg/dL    Comment: (NOTE) Performed At: [facility] [address] [place], [place] [id number] Jennette Shorter MD Ey:[id number]   Vitamin B12     Status: Abnormal   Collection Time: 06/24/24  8:33 AM  Result Value Ref Range   Vitamin B-12 1,783 (H) 180 - 914 pg/mL    Comment: (NOTE) This assay is not validated for testing neonatal or myeloproliferative syndrome specimens for Vitamin B12 levels. Performed at [facility], [address], [place], [place] [address]   VITAMIN D  25 Hydroxy (Vit-D Deficiency, Fractures)     Status: None   Collection Time: 06/24/24  8:33 AM  Result Value Ref Range   Vit D, 25-Hydroxy 36.51 30 -  100 ng/mL    Comment: (NOTE) Vitamin D  deficiency has been defined by the Institute of Medicine  and an Endocrine Society practice guideline as a level of serum 25-OH  vitamin D  less than 20 ng/mL (1,2). The Endocrine Society went on to  further  define vitamin D  insufficiency as a level between 21 and 29  ng/mL (2).  1. IOM (Institute of Medicine). 2010. Dietary reference intakes for  calcium  and D. [place]: [employer]. 2. Holick MF, Binkley , Bischoff-Ferrari HA, et al. Evaluation,  treatment, and prevention of vitamin D  deficiency: an Endocrine  Society clinical practice guideline, JCEM. 2011 Jul; 96(7): 1911-30.  Performed at [facility], [address], [place], [place] [address]   ECHOCARDIOGRAM COMPLETE     Status: None   Collection Time: 06/25/24  7:46 AM  Result Value Ref Range   Weight 3,153.46 oz   Height 73 in   BP 146/90 mmHg   Ao pk vel 0.98 m/s   AV Area VTI 2.41 cm2   AR max vel 2.56 cm2   AV Mean grad 2.0 mmHg   AV Peak grad 3.8 mmHg   S' Lateral 2.90 cm   AV Area mean vel 2.11 cm2   Area-P 1/2 3.05 cm2   MV VTI 2.28 cm2   Est EF 65 - 70%     Assessment/Plan:  AAA (abdominal aortic aneurysm) without rupture He recently underwent an MRI of the back which I have independently reviewed.  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   MRI is a notoriously inaccurate way of measuring aneurysms.  It tends to overestimate the size of the aneurysm, but obviously if this is approaching 5 cm it we will need to have more detailed evaluation.  I will order a CT angiogram of the abdomen pelvis for further evaluation of his aneurysm and determine whether or not this has reached the 5 cm threshold for consideration for prophylactic repair, and if it would be a candidate for a stent graft repair.  I discussed the pathophysiology and natural history of abdominal aortic aneurysm.  I discussed the reason and rationale for treatment to avoid lethal rupture.  I will see him back following the CT angiogram.  HTN, goal below 140/90 blood pressure control important in reducing the progression of atherosclerotic disease and  aneurysmal growth. On appropriate oral medications.   Hypercholesterolemia lipid control important in reducing the progression of atherosclerotic disease. Continue statin therapy      Selinda Gu 09/11/2024, 11:05 AM   This note was created with Dragon medical transcription system.  Any errors from dictation are unintentional.

## 2024-08-28 NOTE — Assessment & Plan Note (Signed)
blood pressure control important in reducing the progression of atherosclerotic disease and aneurysmal growth. On appropriate oral medications.  

## 2024-09-01 ENCOUNTER — Ambulatory Visit
Admission: RE | Admit: 2024-09-01 | Discharge: 2024-09-01 | Disposition: A | Discharge status: Home / Self Care | Source: Ambulatory Visit | Attending: Vascular Surgery | Admitting: Vascular Surgery

## 2024-09-01 DIAGNOSIS — I7133 Infrarenal abdominal aortic aneurysm, ruptured: Secondary | ICD-10-CM | POA: Insufficient documentation

## 2024-09-01 MED ORDER — IOHEXOL 350 MG/ML SOLN
100.0000 mL | Freq: Once | INTRAVENOUS | Status: AC | PRN
  Administered 2024-09-01: 100 mL via INTRAVENOUS

## 2024-09-11 ENCOUNTER — Encounter (INDEPENDENT_AMBULATORY_CARE_PROVIDER_SITE_OTHER): Payer: Self-pay | Admitting: Vascular Surgery

## 2024-09-11 ENCOUNTER — Ambulatory Visit (INDEPENDENT_AMBULATORY_CARE_PROVIDER_SITE_OTHER): Admitting: Vascular Surgery

## 2024-09-11 VITALS — BP 144/78 | HR 71 | Resp 16 | Wt 196.6 lb

## 2024-09-11 DIAGNOSIS — I7143 Infrarenal abdominal aortic aneurysm, without rupture: Secondary | ICD-10-CM | POA: Diagnosis not present

## 2024-09-11 DIAGNOSIS — I10 Essential (primary) hypertension: Secondary | ICD-10-CM

## 2024-09-11 DIAGNOSIS — E7800 Pure hypercholesterolemia, unspecified: Secondary | ICD-10-CM | POA: Diagnosis not present

## 2024-09-11 NOTE — Assessment & Plan Note (Signed)
blood pressure control important in reducing the progression of atherosclerotic disease and aneurysmal growth. On appropriate oral medications.  

## 2024-09-11 NOTE — Assessment & Plan Note (Signed)
 He has undergone a CT angiogram of the abdomen pelvis which I have independently reviewed.  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  There also appears to be a penetrating ulcer in the aortic aneurysm itself and it looks very irregular in the distal infrarenal aorta.  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  He has 2 renal arteries bilaterally.  I would generally agree with the report although I think it might be slightly larger than the 5.3 cm reported. His anatomy is amenable to endovascular repair although we will likely have to perform coil embolization of the right internal iliac artery and extended the right external iliac artery.  He also does not have a particularly long neck and it is possible we will have to snorkel into the main left renal artery to get a proximal seal.  He also has constrained area in the mid abdominal aorta above the penetrating ulcer and we will have to watch where the gate opens for cannulation.  These will create some increased challenge, but given the ugly appearance of the penetrating ulcer and a greater than 5 cm abdominal aortic aneurysm this is clearly at high risk of rupture and needs to be repaired.  He is not a great candidate for open repair.  I have discussed this with he and his wife today and they desire to proceed with repair as soon as possible.  We will work on getting cardiac clearance in the near future.

## 2024-09-11 NOTE — Progress Notes (Signed)
 MRN : [id number]  Albert [name] is a 78 y.o. ([birth year]) male who presents with chief complaint of  Chief Complaint  Patient presents with   Follow-up    Ct results  .  History of Present Illness: Patient returns today in follow up of his abdominal aortic aneurysm.  He has undergone a CT angiogram of the abdomen pelvis which I have independently reviewed.  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  There also appears to be a penetrating ulcer in the aortic aneurysm itself and it looks very irregular in the distal infrarenal aorta.  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  He has 2 renal arteries bilaterally.  I would generally agree with the report although I think it might be slightly larger than the 5.3 cm reported.  No major changes clinically since he was seen earlier this month.  His wife provides much of the history.  Current Outpatient Medications  Medication Sig Dispense Refill   acetaminophen  (TYLENOL ) 325 MG tablet Take 650 mg by mouth every 8 (eight) hours as needed for moderate pain.     ALPRAZolam  (XANAX ) 0.25 MG tablet Take 0.25 mg by mouth at bedtime as needed for anxiety.     amLODipine  (NORVASC ) 10 MG tablet Take 10 mg by mouth daily.     aspirin  EC 81 MG tablet Take 81 mg by mouth daily.     clopidogrel  (PLAVIX ) 75 MG tablet 75 mg daily.     cyanocobalamin  (VITAMIN B12) 1000 MCG tablet Take 1,000 mcg by mouth.  Take 1,000 mcg by mouth once daily     donepezil  (ARICEPT ) 10 MG tablet Take 10 mg by mouth.  TAKE 1 TABLET(10 MG) BY MOUTH AT BEDTIME     DULoxetine  (CYMBALTA ) 60 MG capsule Take 60 mg by mouth daily.     ferrous sulfate  325 (65 FE) MG tablet Take 325 mg by mouth.  Take 325 mg by mouth daily with breakfast     Multiple Vitamin (MULTIVITAMIN  WITH MINERALS) TABS tablet Take 1 tablet by mouth daily.     naphazoline-pheniramine (NAPHCON-A) 0.025-0.3 % ophthalmic solution Place 1 drop into both eyes 4 (four) times daily as needed for eye irritation.     QUEtiapine  (SEROQUEL ) 25 MG tablet Take 25 mg by mouth at bedtime.     rosuvastatin  (CRESTOR ) 20 MG tablet Take 20 mg by mouth daily.     trolamine salicylate (ASPERCREME) 10 % cream Apply 1 application topically as needed for muscle pain.     No current facility-administered medications for this visit.    Past Medical History:  Diagnosis Date   Anxiety    takes Xanax  daily as needed   Arthritis    knees   Back pain    Barrett esophagus    Chronic back pain    Dementia (HCC)    arising in the senium and presenium   Displaced fracture of proximal phalanx of right lesser toe(s), initial encounter for closed fracture    Diverticulosis    GERD (gastroesophageal reflux disease)    OTC meds as needed   Headache    History of colon polyps    benign    History of kidney stones    History of migraine    takes  Imitrex  daily as needed.Last one 2 wks ago   Hyperlipidemia    hx of-has been off of meds for about a yr   Hypertension    Insomnia    only recent d/t pain   Joint pain    Nephrolithiasis    Pre-diabetes    Stroke [facility])    without residual deficits    Past Surgical History:  Procedure Laterality Date   AMPUTATION TOE Right 04/13/2022   Procedure: 28820 - AMPUTATION TOE;  Surgeon: Ashley Soulier, DPM;  Location: [facility] ORS;  Service: Podiatry;  Laterality: Right;   ANKLE ARTHROSCOPY Right 04/13/2022   Procedure: A-SCOPE/OCD REPAIR;  Surgeon: Ashley Soulier, DPM;  Location: [facility] ORS;  Service: Podiatry;  Laterality: Right;   ANKLE SURGERY Right 1997   ankles/screws    ARTHROSCOPIC REPAIR ACL Right 1993   BACK SURGERY  2017   titanium rods and pins in lower back   COLONOSCOPY     EPIDIDYMECTOMY Left 04/10/2017   Procedure: EPIDIDYMECTOMY;  Surgeon: Rosina Riis,  MD;  Location: [facility] ORS;  Service: Urology;  Laterality: Left;   ESOPHAGOGASTRODUODENOSCOPY     LUMBAR FUSION  March 13, 2016   L4/L5 fusion   MINOR HARDWARE REMOVAL Right 04/13/2022   Procedure: REMOVAL SCREW; DEEP;  Surgeon: Ashley Soulier, DPM;  Location: [facility] ORS;  Service: Podiatry;  Laterality: Right;   TONSILLECTOMY  1962   adenoidectomy    VASECTOMY  1973   WOUND DEBRIDEMENT Right 04/13/2022   Procedure: 29906 - SUBTALAR ARTHROSCOPY;  Surgeon: Ashley Soulier, DPM;  Location: [facility] ORS;  Service: Podiatry;  Laterality: Right;     Social History   Tobacco Use   Smoking status: Every Day    Current packs/day: 0.50    Average packs/day: 0.5 packs/day for 50.0 years (25.0 ttl pk-yrs)    Types: Cigarettes   Smokeless tobacco: Never   Tobacco comments:    Half a pack a day, per wife.  Vaping Use   Vaping status: Never Used  Substance Use Topics   Alcohol use: No   Drug use: No       Family History  Problem Relation Age of Onset   Arthritis Mother    Esophageal cancer Father    Prostate cancer Neg Hx    Bladder Cancer Neg Hx    Kidney cancer Neg Hx      Allergies  Allergen Reactions   Codeine Nausea And Vomiting     REVIEW OF SYSTEMS (Negative unless checked)   Constitutional: [] Weight loss  [] Fever  [] Chills Cardiac: [] Chest pain   [] Chest pressure   [] Palpitations   [] Shortness of breath when laying flat   [] Shortness of breath at rest   [] Shortness of breath with exertion. Vascular:  [] Pain in legs with walking   [] Pain in legs at rest   [] Pain in legs when laying flat   [] Claudication   [] Pain in feet when walking  [] Pain in feet at rest  [] Pain in feet when laying flat   [] History of DVT   [] Phlebitis   [] Swelling in legs   [] Varicose veins   [] Non-healing ulcers Pulmonary:   [] Uses home oxygen   [] Productive cough   [] Hemoptysis   [] Wheeze  [] COPD   [] Asthma Neurologic:  [] Dizziness  [] Blackouts   [] Seizures   [x] History of stroke   [] History of TIA   [] Aphasia   [] Temporary blindness   [] Dysphagia   [] Weakness or numbness in arms   [x] Weakness or numbness in legs Musculoskeletal:  [x] Arthritis   []   Joint swelling   [] Joint pain   [x] Low back pain Hematologic:  [] Easy bruising  [] Easy bleeding   [] Hypercoagulable state   [] Anemic  [] Hepatitis Gastrointestinal:  [] Blood in stool   [] Vomiting blood  [x] Gastroesophageal reflux/heartburn   [] Abdominal pain Genitourinary:  [] Chronic kidney disease   [] Difficult urination  [] Frequent urination  [] Burning with urination   [] Hematuria Skin:  [] Rashes   [] Ulcers   [] Wounds Psychological:  [x] History of anxiety   []  History of major depression.  Physical Examination  BP (!) 144/78   Pulse 71   Resp 16   Wt 196 lb 9.6 oz (89.2 kg)   BMI 25.94 kg/m  Gen:  WD/WN, NAD Head: [place]/AT, No temporalis wasting. Ear/Nose/Throat: Hearing grossly intact, nares w/o erythema or drainage Eyes: Conjunctiva clear. Sclera non-icteric Neck: Supple.  Trachea midline Pulmonary:  Good air movement, no use of accessory muscles.  Cardiac: RRR, no JVD Vascular:  Vessel Right Left  Radial Palpable Palpable                          PT Palpable Palpable  DP Palpable Palpable   Gastrointestinal: soft, non-tender/non-distended. No guarding/reflex. Aorta not easily palpable Musculoskeletal: M/S 5/5 throughout.  No deformity or atrophy. Trace LE edema. Neurologic: Sensation grossly intact in extremities.  Symmetrical.  Speech is fluent.  Psychiatric: Judgment and insight appear fair. Wife provides much of the history. Dermatologic: No rashes or ulcers noted.  No cellulitis or open wounds.      Labs Recent Results (from the past 2160 hours)  Comprehensive metabolic panel     Status: Abnormal   Collection Time: 06/22/24  4:03 PM  Result Value Ref Range   Sodium 141 135 - 145 mmol/L   Potassium 3.6 3.5 - 5.1 mmol/L   Chloride 105 98 - 111 mmol/L   CO2 26 22 - 32 mmol/L   Glucose, Bld 89 70 - 99 mg/dL     Comment: Glucose reference range applies only to samples taken after fasting for at least 8 hours.   BUN 23 8 - 23 mg/dL   Creatinine, Ser 8.77 0.61 - 1.24 mg/dL   Calcium  8.8 (L) 8.9 - 10.3 mg/dL   Total Protein 5.9 (L) 6.5 - 8.1 g/dL   Albumin 3.4 (L) 3.5 - 5.0 g/dL   AST 23 15 - 41 U/L   ALT 26 0 - 44 U/L   Alkaline Phosphatase 66 38 - 126 U/L   Total Bilirubin 0.5 0.0 - 1.2 mg/dL   GFR, Estimated >39 >39 mL/min    Comment: (NOTE) Calculated using the CKD-EPI Creatinine Equation (2021)    Anion gap 10 5 - 15    Comment: Performed at [facility], [address] [place], [place] [address]  CBC with Differential     Status: Abnormal   Collection Time: 06/22/24  4:03 PM  Result Value Ref Range   WBC 6.8 4.0 - 10.5 K/uL   RBC 3.57 (L) 4.22 - 5.81 MIL/uL   Hemoglobin 10.9 (L) 13.0 - 17.0 g/dL   HCT 65.5 (L) 60.9 - 47.9 %   MCV 96.4 80.0 - 100.0 fL   MCH 30.5 26.0 - 34.0 pg   MCHC 31.7 30.0 - 36.0 g/dL   RDW 85.0 88.4 - 84.4 %   Platelets 276 150 - 400 K/uL   nRBC 0.0 0.0 - 0.2 %   Neutrophils Relative % 48 %   Neutro Abs 3.3 1.7 - 7.7 K/uL  Lymphocytes Relative 34 %   Lymphs Abs 2.3 0.7 - 4.0 K/uL   Monocytes Relative 13 %   Monocytes Absolute 0.9 0.1 - 1.0 K/uL   Eosinophils Relative 4 %   Eosinophils Absolute 0.3 0.0 - 0.5 K/uL   Basophils Relative 1 %   Basophils Absolute 0.1 0.0 - 0.1 K/uL   Immature Granulocytes 0 %   Abs Immature Granulocytes 0.02 0.00 - 0.07 K/uL    Comment: Performed at [facility], [address], [place], [place] [address]  Troponin I (High Sensitivity)     Status: None   Collection Time: 06/22/24  4:03 PM  Result Value Ref Range   Troponin I (High Sensitivity) 11 <18 ng/L    Comment: (NOTE) Elevated high sensitivity troponin I (hsTnI) values and significant  changes across serial measurements may suggest ACS but many other  chronic and acute conditions are known to elevate hsTnI results.  Refer to the Links section  for chest pain algorithms and additional  guidance. Performed at [facility], [address] [place], [place] [address]   Urinalysis, Routine w reflex microscopic -Urine, Clean Catch     Status: Abnormal   Collection Time: 06/22/24  5:25 PM  Result Value Ref Range   Color, Urine YELLOW (A) YELLOW   APPearance CLEAR (A) CLEAR   Specific Gravity, Urine 1.025 1.005 - 1.030   pH 5.0 5.0 - 8.0   Glucose, UA NEGATIVE NEGATIVE mg/dL   Hgb urine dipstick NEGATIVE NEGATIVE   Bilirubin Urine NEGATIVE NEGATIVE   Ketones, ur NEGATIVE NEGATIVE mg/dL   Protein, ur NEGATIVE NEGATIVE mg/dL   Nitrite NEGATIVE NEGATIVE   Leukocytes,Ua NEGATIVE NEGATIVE    Comment: Performed at [facility], [address], [place], [place] [address]  Urine Drug Screen, Qualitative ([facility] only)     Status: Abnormal   Collection Time: 06/22/24  5:25 PM  Result Value Ref Range   Tricyclic, Ur Screen POSITIVE (A) NONE DETECTED   Amphetamines, Ur Screen NONE DETECTED NONE DETECTED   MDMA (Ecstasy)Ur Screen NONE DETECTED NONE DETECTED   Cocaine Metabolite,Ur [place] NONE DETECTED NONE DETECTED   Opiate, Ur Screen NONE DETECTED NONE DETECTED   Phencyclidine (PCP) Ur S NONE DETECTED NONE DETECTED   Cannabinoid 50 Ng, Ur [place] NONE DETECTED NONE DETECTED   Barbiturates, Ur Screen NONE DETECTED NONE DETECTED   Benzodiazepine, Ur Scrn NONE DETECTED NONE DETECTED   Methadone Scn, Ur NONE DETECTED NONE DETECTED    Comment: (NOTE) Tricyclics + metabolites, urine    Cutoff 1000 ng/mL Amphetamines + metabolites, urine  Cutoff 1000 ng/mL MDMA (Ecstasy), urine              Cutoff 500 ng/mL Cocaine Metabolite, urine          Cutoff 300 ng/mL Opiate + metabolites, urine        Cutoff 300 ng/mL Phencyclidine (PCP), urine         Cutoff 25 ng/mL Cannabinoid, urine                 Cutoff 50 ng/mL Barbiturates + metabolites, urine  Cutoff 200 ng/mL Benzodiazepine, urine              Cutoff 200 ng/mL Methadone, urine                    Cutoff 300 ng/mL  The urine drug screen provides only a preliminary, unconfirmed analytical test result and should not be used for non-medical purposes.  Clinical consideration and professional judgment should be applied to any positive drug screen result due to possible interfering substances. A more specific alternate chemical method must be used in order to obtain a confirmed analytical result. Gas chromatography / mass spectrometry (GC/MS) is the preferred confirm atory method. Performed at [facility], [address] [place], [place] [address]   CBC with Differential     Status: Abnormal   Collection Time: 06/23/24  3:45 PM  Result Value Ref Range   WBC 6.4 4.0 - 10.5 K/uL   RBC 3.72 (L) 4.22 - 5.81 MIL/uL   Hemoglobin 11.3 (L) 13.0 - 17.0 g/dL   HCT 64.5 (L) 60.9 - 47.9 %   MCV 95.2 80.0 - 100.0 fL   MCH 30.4 26.0 - 34.0 pg   MCHC 31.9 30.0 - 36.0 g/dL   RDW 85.0 88.4 - 84.4 %   Platelets 274 150 - 400 K/uL   nRBC 0.0 0.0 - 0.2 %   Neutrophils Relative % 52 %   Neutro Abs 3.3 1.7 - 7.7 K/uL   Lymphocytes Relative 33 %   Lymphs Abs 2.1 0.7 - 4.0 K/uL   Monocytes Relative 11 %   Monocytes Absolute 0.7 0.1 - 1.0 K/uL   Eosinophils Relative 3 %   Eosinophils Absolute 0.2 0.0 - 0.5 K/uL   Basophils Relative 1 %   Basophils Absolute 0.0 0.0 - 0.1 K/uL   Immature Granulocytes 0 %   Abs Immature Granulocytes 0.02 0.00 - 0.07 K/uL    Comment: Performed at [facility], [address] [place], [place] [address]  Comprehensive metabolic panel     Status: Abnormal   Collection Time: 06/23/24  3:45 PM  Result Value Ref Range   Sodium 141 135 - 145 mmol/L   Potassium 3.7 3.5 - 5.1 mmol/L   Chloride 109 98 - 111 mmol/L   CO2 22 22 - 32 mmol/L   Glucose, Bld 105 (H) 70 - 99 mg/dL    Comment: Glucose reference range applies only to samples taken after fasting for at least 8 hours.   BUN 24 (H) 8 - 23 mg/dL   Creatinine, Ser 9.03 0.61 - 1.24  mg/dL   Calcium  9.1 8.9 - 10.3 mg/dL   Total Protein 6.5 6.5 - 8.1 g/dL   Albumin 3.8 3.5 - 5.0 g/dL   AST 21 15 - 41 U/L   ALT 23 0 - 44 U/L   Alkaline Phosphatase 78 38 - 126 U/L   Total Bilirubin 0.4 0.0 - 1.2 mg/dL   GFR, Estimated >39 >39 mL/min    Comment: (NOTE) Calculated using the CKD-EPI Creatinine Equation (2021)    Anion gap 10 5 - 15    Comment: Performed at [facility], [address] [place], [place] [address]  Protime-INR     Status: None   Collection Time: 06/23/24  3:45 PM  Result Value Ref Range   Prothrombin Time 14.4 11.4 - 15.2 seconds   INR 1.1 0.8 - 1.2    Comment: (NOTE) INR goal varies based on device and disease states. Performed at [facility], [address] [place], [place] [address]   APTT     Status: None   Collection Time: 06/23/24  3:45 PM  Result Value Ref Range   aPTT 31 24 - 36 seconds    Comment: Performed at [facility], [address] [place], [place] [address]  TSH     Status: None  Collection Time: 06/24/24  5:28 AM  Result Value Ref Range   TSH 1.627 0.350 - 4.500 uIU/mL    Comment: Performed by a 3rd Generation assay with a functional sensitivity of <=0.01 uIU/mL. Performed at [facility], [address] [place], [place] [address]   Iron and TIBC     Status: Abnormal   Collection Time: 06/24/24  5:28 AM  Result Value Ref Range   Iron 41 (L) 45 - 182 ug/dL   TIBC 653 749 - 549 ug/dL   Saturation Ratios 12 (L) 17.9 - 39.5 %   UIBC 305 ug/dL    Comment: Performed at [facility], [address] [place], [place] [address]  Folate     Status: None   Collection Time: 06/24/24  5:28 AM  Result Value Ref Range   Folate 35.0 >5.9 ng/mL    Comment: Performed at [facility], [address] [place], [place] [address]  Lipid panel     Status: Abnormal   Collection Time: 06/24/24  5:29 AM  Result Value Ref Range   Cholesterol 117 0 - 200 mg/dL   Triglycerides 888  <849 mg/dL   HDL 34 (L) >59 mg/dL   Total CHOL/HDL Ratio 3.4 RATIO   VLDL 22 0 - 40 mg/dL   LDL Cholesterol 61 0 - 99 mg/dL    Comment:        Total Cholesterol/HDL:CHD Risk Coronary Heart Disease Risk Table                     Men   Women  1/2 Average Risk   3.4   3.3  Average Risk       5.0   4.4  2 X Average Risk   9.6   7.1  3 X Average Risk  23.4   11.0        Use the calculated Patient Ratio above and the CHD Risk Table to determine the patient's CHD Risk.        ATP III CLASSIFICATION (LDL):  <100     mg/dL   Optimal  899-870  mg/dL   Near or Above                    Optimal  130-159  mg/dL   Borderline  839-810  mg/dL   High  >809     mg/dL   Very High Performed at [facility], [address] [place], [place] [address]   Hemoglobin A1c     Status: None   Collection Time: 06/24/24  5:29 AM  Result Value Ref Range   Hgb A1c MFr Bld 5.2 4.8 - 5.6 %    Comment: (NOTE)         Prediabetes: 5.7 - 6.4         Diabetes: >6.4         Glycemic control for adults with diabetes: <7.0    Mean Plasma Glucose 103 mg/dL    Comment: (NOTE) Performed At: [facility] [address] [place], [place] [id number] Jennette Shorter MD Ey:[id number]   Vitamin B12     Status: Abnormal   Collection Time: 06/24/24  8:33 AM  Result Value Ref Range   Vitamin B-12 1,783 (H) 180 - 914 pg/mL    Comment: (NOTE) This assay is not validated for testing neonatal or myeloproliferative syndrome specimens for Vitamin B12 levels. Performed at [facility], [address], [place], [place] [address]  VITAMIN D  25 Hydroxy (Vit-D Deficiency, Fractures)     Status: None   Collection Time: 06/24/24  8:33 AM  Result Value Ref Range   Vit D, 25-Hydroxy 36.51 30 - 100 ng/mL    Comment: (NOTE) Vitamin D  deficiency has been defined by the Institute of Medicine  and an Endocrine Society practice guideline as a level of serum 25-OH  vitamin D  less than 20 ng/mL (1,2). The  Endocrine Society went on to  further define vitamin D  insufficiency as a level between 21 and 29  ng/mL (2).  1. IOM (Institute of Medicine). 2010. Dietary reference intakes for  calcium  and D. [place]: The [employer]. 2. Holick MF, Binkley [place], Bischoff-Ferrari HA, et al. Evaluation,  treatment, and prevention of vitamin D  deficiency: an Endocrine  Society clinical practice guideline, JCEM. 2011 Jul; 96(7): 1911-30.  Performed at [facility], [address], [place], [place] [address]   ECHOCARDIOGRAM COMPLETE     Status: None   Collection Time: 06/25/24  7:46 AM  Result Value Ref Range   Weight 3,153.46 oz   Height 73 in   BP 146/90 mmHg   Ao pk vel 0.98 m/s   AV Area VTI 2.41 cm2   AR max vel 2.56 cm2   AV Mean grad 2.0 mmHg   AV Peak grad 3.8 mmHg   S' Lateral 2.90 cm   AV Area mean vel 2.11 cm2   Area-P 1/2 3.05 cm2   MV VTI 2.28 cm2   Est EF 65 - 70%     Radiology CT Angio Abd/Pel w/ and/or w/o Result Date: 09/02/2024 CLINICAL DATA:  Abdominal aortic aneurysm, preoperative planning EXAM: CTA ABDOMEN AND PELVIS WITHOUT AND WITH CONTRAST TECHNIQUE: Multidetector CT imaging of the abdomen and pelvis was performed using the standard protocol during bolus administration of intravenous contrast. Multiplanar reconstructed images and MIPs were obtained and reviewed to evaluate the vascular anatomy. RADIATION DOSE REDUCTION: This exam was performed according to the departmental dose-optimization program which includes automated exposure control, adjustment of the mA and/or kV according to patient size and/or use of iterative reconstruction technique. CONTRAST:  OMNIPAQUE  IOHEXOL  350 MG/ML SOLN COMPARISON:  None Available. FINDINGS: VASCULAR Aorta: A bilobed aneurysm of the infrarenal abdominal aorta is present which demonstrates an angulated neck. There is moderate plaque within the aneurysm as well as irregular morphology of the posterolateral wall  possibly secondary to superimposed penetrating atherosclerotic ulceration. Aortic aneurysm measures approximately 5.3 x 5.2 cm using 3D measurement techniques within the infrarenal segment. Celiac: Patent. SMA: Patent. Renals: 2 right renal arteries, patent. 2 left renal arteries, patent. IMA: Small in caliber, patent. Inflow: The right iliac limb is ectatic. The right common iliac artery measures proximally 2.0 cm in largest transverse dimension. A penetrating atherosclerotic ulcer is present in the distal right common iliac artery near the origin of the internal iliac artery. The right internal iliac artery is patent. Mild atherosclerotic changes are present in the right external iliac artery. Left common iliac artery is ectatic and measures approximately 1.5 cm in diameter. Left internal iliac artery is patent. The left external iliac artery is patent and ectatic with mild diffuse atherosclerotic changes. Proximal Outflow: Bilateral common femoral arteries are patent. Veins: Nothing significant. Review of the MIP images confirms the above findings. NON-VASCULAR Lower chest: Nothing significant. Hepatobiliary: Gallbladder is decompressed and contains a gallstone. Pancreas: Nothing significant. Spleen: Within normal limits. Adrenals/Urinary Tract: A left adrenal nodule is present which measures approximately  4.2 cm with low attenuation and scattered calcification. This is decreased in size when compared to March 07, 2015. The right adrenal gland is within normal limits. Simple cysts are present in both kidneys. Additionally, there is a cortical density in the midpole of the right kidney which measures 1.9 cm on image 71 of CT series 4 and is not clearly a simple cyst and was not clearly seen on CT scan performed May 07, 2015. A smaller, dense cortical structure is also present in the midpole of the left kidney on image 84 of CT series 4 measuring 1.1 cm. Stomach/Bowel: No dilated loops of bowel are seen. Colonic  diverticulosis. Lymphatic: No evidence of significant lymphadenopathy. Reproductive: Nothing significant. Other: Fat containing umbilical hernia. Musculoskeletal: Postsurgical changes from lumbar spine fusion. IMPRESSION: 1. Infrarenal abdominal aortic aneurysm measuring approximately 5.3 cm using 3D measurement techniques. The right posterolateral wall of the aneurysm is irregular in contour and is suspicious for underlying penetrating atherosclerotic ulcer. 2. 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 3. Cortical densities in both kidneys which are new when compared to the previous exam. Consideration should be given toward further evaluation by MRI of the abdomen and pelvis, with and without contrast to evaluate for small renal neoplasm. These results will be called to the ordering clinician or representative by the Radiologist Assistant, and communication documented in the PACS or [employer]. Electronically Signed   By: Maude Naegeli M.D.   On: 09/02/2024 09:48    Assessment/Plan  HTN, goal below 140/90 blood pressure control important in reducing the progression of atherosclerotic disease and aneurysmal growth. On appropriate oral medications.   Hypercholesterolemia lipid control important in reducing the progression of atherosclerotic disease. Continue statin therapy   AAA (abdominal aortic aneurysm) without rupture He has undergone a CT angiogram of the abdomen pelvis which I have independently reviewed.  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  There also appears to be a penetrating ulcer in the aortic aneurysm itself and it looks very irregular in the distal infrarenal aorta.  This is really a bilobed aneurysm with a narrowing in the  midportion of the aorta.  He has 2 renal arteries bilaterally.  I would generally agree with the report although I think it might be slightly larger than the 5.3 cm reported. His anatomy is amenable to endovascular repair although we will likely have to perform coil embolization of the right internal iliac artery and extended the right external iliac artery.  He also does not have a particularly long neck and it is possible we will have to snorkel into the main left renal artery to get a proximal seal.  He also has constrained area in the mid abdominal aorta above the penetrating ulcer and we will have to watch where the gate opens for cannulation.  These will create some increased challenge, but given the ugly appearance of the penetrating ulcer and a greater than 5 cm abdominal aortic aneurysm this is clearly at high risk of rupture and needs to be repaired.  He is not a great candidate for open repair.  I have discussed this with he and his wife today and they desire to proceed with repair as soon as possible.  We will work on getting cardiac clearance in the  near future.    Selinda Gu, MD  09/11/2024 11:22 AM    This note was created with Dragon medical transcription system.  Any errors from dictation are purely unintentional

## 2024-09-11 NOTE — Assessment & Plan Note (Signed)
 lipid control important in reducing the progression of atherosclerotic disease. Continue statin therapy

## 2024-10-06 ENCOUNTER — Telehealth (INDEPENDENT_AMBULATORY_CARE_PROVIDER_SITE_OTHER): Payer: Self-pay

## 2024-10-06 NOTE — Telephone Encounter (Signed)
 I attempted to contact the patient to tell him about being scheduled for his AAA repair with Dr. Marea on 10/21/24 with a 11:00 am arrival time to the [facility]. Pre-surgical instructions will be discussed and will be sent to Mychart and mailed. Patient's spouse called back and pre-surgical instructions were discussed. Pre-admit will call to schedule pre-op at the [facility].

## 2024-10-20 ENCOUNTER — Encounter
Admission: RE | Admit: 2024-10-20 | Discharge: 2024-10-20 | Disposition: A | Discharge status: Home / Self Care | Source: Ambulatory Visit | Attending: Vascular Surgery | Admitting: Vascular Surgery

## 2024-10-20 ENCOUNTER — Other Ambulatory Visit: Payer: Self-pay

## 2024-10-20 VITALS — BP 153/94 | HR 87 | Resp 16 | Wt 198.4 lb

## 2024-10-20 DIAGNOSIS — Z01812 Encounter for preprocedural laboratory examination: Secondary | ICD-10-CM | POA: Insufficient documentation

## 2024-10-20 DIAGNOSIS — I10 Essential (primary) hypertension: Secondary | ICD-10-CM | POA: Insufficient documentation

## 2024-10-20 DIAGNOSIS — I714 Abdominal aortic aneurysm, without rupture, unspecified: Secondary | ICD-10-CM

## 2024-10-20 HISTORY — DX: Anemia, unspecified: D64.9

## 2024-10-20 HISTORY — DX: Bilateral primary osteoarthritis of knee: M17.0

## 2024-10-20 LAB — CBC
HCT: 33.6 % — ABNORMAL LOW (ref 39.0–52.0)
Hemoglobin: 10.9 g/dL — ABNORMAL LOW (ref 13.0–17.0)
MCH: 29.8 pg (ref 26.0–34.0)
MCHC: 32.4 g/dL (ref 30.0–36.0)
MCV: 91.8 fL (ref 80.0–100.0)
Platelets: 294 K/uL (ref 150–400)
RBC: 3.66 MIL/uL — ABNORMAL LOW (ref 4.22–5.81)
RDW: 15.2 % (ref 11.5–15.5)
WBC: 7.8 K/uL (ref 4.0–10.5)
nRBC: 0 % (ref 0.0–0.2)

## 2024-10-20 LAB — SURGICAL PCR SCREEN
MRSA, PCR: NEGATIVE
Staphylococcus aureus: NEGATIVE

## 2024-10-20 LAB — BASIC METABOLIC PANEL WITH GFR
Anion gap: 12 (ref 5–15)
BUN: 27 mg/dL — ABNORMAL HIGH (ref 8–23)
CO2: 27 mmol/L (ref 22–32)
Calcium: 9.1 mg/dL (ref 8.9–10.3)
Chloride: 105 mmol/L (ref 98–111)
Creatinine, Ser: 1.02 mg/dL (ref 0.61–1.24)
GFR, Estimated: >60 mL/min (ref >60)
Glucose, Bld: 100 mg/dL — ABNORMAL HIGH (ref 70–99)
Potassium: 4 mmol/L (ref 3.5–5.1)
Sodium: 144 mmol/L (ref 135–145)

## 2024-10-20 NOTE — Patient Instructions (Addendum)
 Your procedure is scheduled on: 10/21/24 - your arrival time, -  at [facility] - 11:00 am. If your arrival time is 6:00 am, do not arrive before that time as the [facility] entrance doors do not open until 6:00 am.  REMEMBER: Instructions that are not followed completely may result in serious medical risk, up to and including death; or upon the discretion of your surgeon and anesthesiologist your surgery may need to be rescheduled.  Do not eat food or drink any liquids after midnight the night before surgery.  No gum chewing or hard candies.  One week prior to surgery: Stop Anti-inflammatories (NSAIDS) such as Advil , Aleve, Ibuprofen , Motrin , Naproxen, Naprosyn and Aspirin  based products such as Excedrin, Goody's Powder, BC Powder. You may take Tylenol  if needed for pain up until the day of surgery.  Stop ANY OVER THE COUNTER supplements until after surgery: Multivitamin  ON THE DAY OF SURGERY ONLY TAKE THESE MEDICATIONS WITH SIPS OF WATER :  amLODipine  (NORVASC )  DULoxetine  (CYMBALTA )    No Alcohol for 24 hours before or after surgery.  No Smoking including e-cigarettes for 24 hours before surgery.  No chewable tobacco products for at least 6 hours before surgery.  No nicotine  patches on the day of surgery.  Do not use any recreational drugs for at least a week (preferably 2 weeks) before your surgery.  Please be advised that the combination of cocaine and anesthesia may have negative outcomes, up to and including death. If you test positive for cocaine, your surgery will be cancelled.  On the morning of surgery brush your teeth with toothpaste and water , you may rinse your mouth with mouthwash if you wish. Do not swallow any toothpaste or mouthwash.  Do not wear jewelry, make-up, hairpins, clips or nail polish.  For welded (permanent) jewelry: bracelets, anklets, waist bands, etc.  Please have this removed prior to surgery.  If it is not removed, there is a chance  that hospital personnel will need to cut it off on the day of surgery.  Do not wear lotions, powders, or perfumes.   Do not shave body hair from the neck down 48 hours before surgery.  Contact lenses, hearing aids and dentures may not be worn into surgery.  Do not bring valuables to the hospital. [facility] is not responsible for any missing/lost belongings or valuables.   Notify your doctor if there is any change in your medical condition (cold, fever, infection).  Wear comfortable clothing (specific to your surgery type) to the hospital.  After surgery, you can help prevent lung complications by doing breathing exercises.  Take deep breaths and cough every 1-2 hours. Your doctor may order a device called an Incentive Spirometer to help you take deep breaths.  When coughing or sneezing, hold a pillow firmly against your incision with both hands. This is called "splinting." Doing this helps protect your incision. It also decreases belly discomfort.  If you are being admitted to the hospital overnight, leave your suitcase in the car. After surgery it may be brought to your room.  In case of increased patient census, it may be necessary for you, the patient, to continue your postoperative care in the Same Day Surgery department.  If you are being discharged the day of surgery, you will not be allowed to drive home. You will need a responsible individual to drive you home and stay with you for 24 hours after surgery.   If you are taking public transportation, you  will need to have a responsible individual with you.  Please call the Pre-admissions Testing Dept. at [phone] if you have any questions about these instructions.  Surgery Visitation Policy:  Patients having surgery or a procedure may have two visitors.  Children under the age of 66 must have an adult with them who is not the patient.  Inpatient Visitation:    Visiting hours are 7 a.m. to 8 p.m. Up to four visitors  are allowed at one time in a patient room. The visitors may rotate out with other people during the day.  One visitor age 70 or older may stay with the patient overnight and must be in the room by 8 p.m.   Merchandiser, Retail to address health-related social needs:  [email or web address]                                                                                                           Preparing the Skin Before Surgery  To help prevent the risk of infection at your surgical site, we are now providing you with rinse-free Sage 2% Chlorhexidine  Gluconate (CHG) disposable wipes.  Chlorhexidine  Gluconate (CHG) Soap  o An antiseptic cleaner that kills germs and bonds with the skin to continue killing germs even after washing  o Used for showering the night before surgery and morning of surgery  The night before surgery: Shower or bathe with warm water . Do not apply perfume, lotions, powders. Wait one hour after shower. Skin should be dry and cool. Open Sage wipe package - use 6 disposable cloths. Wipe body using one cloth for the right arm, one cloth for the left arm, one cloth for the right leg, one cloth for the left leg, one cloth for the chest/abdomen area, and one cloth for the back. Do not use on open wounds or sores. Do not use on face or genitals (private parts). If you are breast feeding, do not use on breasts. 5. Do not rinse, allow to dry. 6. Skin may feel tacky for several minutes. 7. Dress in clean clothes. 8. Place clean sheets on your bed and do not sleep with pets.  REPEAT ABOVE ON THE MORNING OF SURGERY BEFORE ARRIVING TO THE HOSPITAL.

## 2024-10-21 ENCOUNTER — Encounter: Admitting: Urgent Care

## 2024-10-21 ENCOUNTER — Other Ambulatory Visit (INDEPENDENT_AMBULATORY_CARE_PROVIDER_SITE_OTHER): Payer: Self-pay | Admitting: Nurse Practitioner

## 2024-10-21 ENCOUNTER — Inpatient Hospital Stay: Admitting: Urgent Care

## 2024-10-21 ENCOUNTER — Encounter: Admission: RE | Disposition: A | Payer: Self-pay | Source: Home / Self Care | Attending: Vascular Surgery

## 2024-10-21 ENCOUNTER — Other Ambulatory Visit: Payer: Self-pay

## 2024-10-21 ENCOUNTER — Inpatient Hospital Stay
Admission: RE | Admit: 2024-10-21 | Discharge: 2024-10-22 | DRG: 272 | Disposition: A | Discharge status: Home / Self Care | Attending: Vascular Surgery | Admitting: Vascular Surgery

## 2024-10-21 DIAGNOSIS — Z885 Allergy status to narcotic agent status: Secondary | ICD-10-CM

## 2024-10-21 DIAGNOSIS — Z7902 Long term (current) use of antithrombotics/antiplatelets: Secondary | ICD-10-CM

## 2024-10-21 DIAGNOSIS — I771 Stricture of artery: Secondary | ICD-10-CM | POA: Diagnosis not present

## 2024-10-21 DIAGNOSIS — Z981 Arthrodesis status: Secondary | ICD-10-CM

## 2024-10-21 DIAGNOSIS — E7800 Pure hypercholesterolemia, unspecified: Secondary | ICD-10-CM | POA: Diagnosis present

## 2024-10-21 DIAGNOSIS — F0390 Unspecified dementia without behavioral disturbance: Secondary | ICD-10-CM | POA: Diagnosis present

## 2024-10-21 DIAGNOSIS — Z79899 Other long term (current) drug therapy: Secondary | ICD-10-CM | POA: Diagnosis not present

## 2024-10-21 DIAGNOSIS — M170 Bilateral primary osteoarthritis of knee: Secondary | ICD-10-CM | POA: Diagnosis present

## 2024-10-21 DIAGNOSIS — Z8261 Family history of arthritis: Secondary | ICD-10-CM

## 2024-10-21 DIAGNOSIS — R7303 Prediabetes: Secondary | ICD-10-CM | POA: Diagnosis present

## 2024-10-21 DIAGNOSIS — Z860100 Personal history of colon polyps, unspecified: Secondary | ICD-10-CM | POA: Diagnosis not present

## 2024-10-21 DIAGNOSIS — I10 Essential (primary) hypertension: Secondary | ICD-10-CM | POA: Diagnosis present

## 2024-10-21 DIAGNOSIS — Z8673 Personal history of transient ischemic attack (TIA), and cerebral infarction without residual deficits: Secondary | ICD-10-CM | POA: Diagnosis not present

## 2024-10-21 DIAGNOSIS — F17210 Nicotine dependence, cigarettes, uncomplicated: Secondary | ICD-10-CM | POA: Diagnosis present

## 2024-10-21 DIAGNOSIS — I723 Aneurysm of iliac artery: Secondary | ICD-10-CM

## 2024-10-21 DIAGNOSIS — I7143 Infrarenal abdominal aortic aneurysm, without rupture: Principal | ICD-10-CM | POA: Diagnosis present

## 2024-10-21 DIAGNOSIS — Z800 Family history of malignant neoplasm of digestive organs: Secondary | ICD-10-CM | POA: Diagnosis not present

## 2024-10-21 DIAGNOSIS — F419 Anxiety disorder, unspecified: Secondary | ICD-10-CM | POA: Diagnosis present

## 2024-10-21 DIAGNOSIS — M549 Dorsalgia, unspecified: Secondary | ICD-10-CM | POA: Diagnosis present

## 2024-10-21 DIAGNOSIS — I714 Abdominal aortic aneurysm, without rupture, unspecified: Secondary | ICD-10-CM | POA: Diagnosis present

## 2024-10-21 DIAGNOSIS — Z7982 Long term (current) use of aspirin: Secondary | ICD-10-CM | POA: Diagnosis not present

## 2024-10-21 DIAGNOSIS — G8929 Other chronic pain: Secondary | ICD-10-CM | POA: Diagnosis present

## 2024-10-21 HISTORY — PX: ENDOVASCULAR STENT GRAFT (AAA): CATH118280

## 2024-10-21 LAB — PREPARE RBC (CROSSMATCH)

## 2024-10-21 LAB — GLUCOSE, CAPILLARY
Glucose-Capillary: 124 mg/dL — ABNORMAL HIGH (ref 70–99)
Glucose-Capillary: 112 mg/dL — ABNORMAL HIGH (ref 70–99)

## 2024-10-21 LAB — MRSA NEXT GEN BY PCR, NASAL: MRSA by PCR Next Gen: NOT DETECTED

## 2024-10-21 SURGERY — ENDOVASCULAR STENT GRAFT (AAA)
Anesthesia: General

## 2024-10-21 MED ORDER — METOPROLOL TARTRATE 5 MG/5ML IV SOLN: 2.5000 mg | INTRAVENOUS | Status: DC | PRN

## 2024-10-21 MED ORDER — EPHEDRINE SULFATE-NACL 50-0.9 MG/10ML-% IV SOSY
PREFILLED_SYRINGE | INTRAVENOUS | Status: DC | PRN
  Administered 2024-10-21: 10 mg via INTRAVENOUS

## 2024-10-21 MED ORDER — PHENYLEPHRINE HCL-NACL 20-0.9 MG/250ML-% IV SOLN
INTRAVENOUS | Status: DC | PRN
  Administered 2024-10-21: 20 ug/min via INTRAVENOUS

## 2024-10-21 MED ORDER — OXYCODONE HCL 5 MG/5ML PO SOLN
5.0000 mg | Freq: Once | ORAL | Status: DC | PRN
  Filled 2024-10-21: qty 5

## 2024-10-21 MED ORDER — ONDANSETRON HCL 4 MG/2ML IJ SOLN
INTRAMUSCULAR | Status: DC | PRN
  Administered 2024-10-21: 4 mg via INTRAVENOUS

## 2024-10-21 MED ORDER — CEFAZOLIN SODIUM-DEXTROSE 2-4 GM/100ML-% IV SOLN
2.0000 g | INTRAVENOUS | Status: AC
  Administered 2024-10-21: 2 g via INTRAVENOUS

## 2024-10-21 MED ORDER — HYDROMORPHONE HCL 1 MG/ML IJ SOLN: 1.0000 mg | Freq: Once | INTRAMUSCULAR | Status: DC | PRN

## 2024-10-21 MED ORDER — FERROUS SULFATE 325 (65 FE) MG PO TABS
325.0000 mg | ORAL_TABLET | Freq: Every day | ORAL | Status: DC
  Administered 2024-10-22: 325 mg via ORAL
  Filled 2024-10-21: qty 1

## 2024-10-21 MED ORDER — FENTANYL CITRATE (PF) 100 MCG/2ML IJ SOLN: 25.0000 ug | INTRAMUSCULAR | Status: DC | PRN

## 2024-10-21 MED ORDER — PHENOL 1.4 % MT LIQD: 1.0000 | OROMUCOSAL | Status: DC | PRN

## 2024-10-21 MED ORDER — HYDRALAZINE HCL 20 MG/ML IJ SOLN
5.0000 mg | INTRAMUSCULAR | Status: DC | PRN
  Administered 2024-10-21: 5 mg via INTRAVENOUS
  Filled 2024-10-21: qty 1

## 2024-10-21 MED ORDER — LACTATED RINGERS IV SOLN: INTRAVENOUS | Status: DC | PRN

## 2024-10-21 MED ORDER — PROPOFOL 10 MG/ML IV BOLUS
INTRAVENOUS | Status: AC
Start: 2024-10-21 — End: 2024-10-21
  Filled 2024-10-21: qty 20

## 2024-10-21 MED ORDER — AMLODIPINE BESYLATE 10 MG PO TABS
10.0000 mg | ORAL_TABLET | Freq: Every day | ORAL | Status: DC
  Administered 2024-10-22: 10 mg via ORAL
  Filled 2024-10-21: qty 1

## 2024-10-21 MED ORDER — LIDOCAINE HCL (CARDIAC) PF 100 MG/5ML IV SOSY
PREFILLED_SYRINGE | INTRAVENOUS | Status: DC | PRN
  Administered 2024-10-21: 100 mg via INTRAVENOUS

## 2024-10-21 MED ORDER — MAGNESIUM HYDROXIDE 400 MG/5ML PO SUSP: 30.0000 mL | Freq: Every day | ORAL | Status: DC | PRN

## 2024-10-21 MED ORDER — SODIUM CHLORIDE 0.9 % IV SOLN: INTRAVENOUS | Status: DC

## 2024-10-21 MED ORDER — ORAL CARE MOUTH RINSE
15.0000 mL | Freq: Once | OROMUCOSAL | Status: AC
Start: 2024-10-21 — End: 2024-10-21

## 2024-10-21 MED ORDER — PHENYLEPHRINE HCL-NACL 20-0.9 MG/250ML-% IV SOLN
INTRAVENOUS | Status: AC
  Filled 2024-10-21: qty 250

## 2024-10-21 MED ORDER — ALPRAZOLAM 0.25 MG PO TABS
0.2500 mg | ORAL_TABLET | Freq: Every evening | ORAL | Status: DC | PRN
  Administered 2024-10-22: 0.25 mg via ORAL
  Filled 2024-10-21: qty 1

## 2024-10-21 MED ORDER — ADULT MULTIVITAMIN W/MINERALS CH
1.0000 | ORAL_TABLET | Freq: Every day | ORAL | Status: DC
  Administered 2024-10-22: 1 via ORAL
  Filled 2024-10-21: qty 1

## 2024-10-21 MED ORDER — ONDANSETRON HCL 4 MG/2ML IJ SOLN: 4.0000 mg | Freq: Four times a day (QID) | INTRAMUSCULAR | Status: DC | PRN

## 2024-10-21 MED ORDER — CHLORHEXIDINE GLUCONATE CLOTH 2 % EX PADS: 6.0000 | MEDICATED_PAD | Freq: Once | CUTANEOUS | Status: DC

## 2024-10-21 MED ORDER — LACTATED RINGERS IV SOLN: INTRAVENOUS | Status: DC

## 2024-10-21 MED ORDER — ORAL CARE MOUTH RINSE: 15.0000 mL | OROMUCOSAL | Status: DC | PRN

## 2024-10-21 MED ORDER — LABETALOL HCL 5 MG/ML IV SOLN: 10.0000 mg | INTRAVENOUS | Status: DC | PRN

## 2024-10-21 MED ORDER — CLOPIDOGREL BISULFATE 75 MG PO TABS
75.0000 mg | ORAL_TABLET | Freq: Every day | ORAL | Status: DC
  Administered 2024-10-21 – 2024-10-22 (×2): 75 mg via ORAL
  Filled 2024-10-21 (×2): qty 1

## 2024-10-21 MED ORDER — FENTANYL CITRATE (PF) 100 MCG/2ML IJ SOLN
INTRAMUSCULAR | Status: AC
  Filled 2024-10-21: qty 2

## 2024-10-21 MED ORDER — SORBITOL 70 % SOLN
30.0000 mL | Freq: Every day | Status: DC | PRN
Start: 2024-10-21 — End: 2024-10-22

## 2024-10-21 MED ORDER — LIDOCAINE HCL (PF) 2 % IJ SOLN
INTRAMUSCULAR | Status: AC
  Filled 2024-10-21: qty 5

## 2024-10-21 MED ORDER — HEPARIN SODIUM (PORCINE) 1000 UNIT/ML IJ SOLN
INTRAMUSCULAR | Status: DC | PRN
Start: 2024-10-21 — End: 2024-10-21
  Administered 2024-10-21: 2000 [IU] via INTRAVENOUS
  Administered 2024-10-21: 7000 [IU] via INTRAVENOUS

## 2024-10-21 MED ORDER — ROCURONIUM BROMIDE 10 MG/ML (PF) SYRINGE
PREFILLED_SYRINGE | INTRAVENOUS | Status: AC
  Filled 2024-10-21: qty 10

## 2024-10-21 MED ORDER — IODIXANOL 320 MG/ML IV SOLN
INTRAVENOUS | Status: DC | PRN
  Administered 2024-10-21: 85 mL via INTRA_ARTERIAL

## 2024-10-21 MED ORDER — CEFAZOLIN SODIUM-DEXTROSE 2-4 GM/100ML-% IV SOLN
INTRAVENOUS | Status: AC
  Filled 2024-10-21: qty 100

## 2024-10-21 MED ORDER — OXYCODONE HCL 5 MG PO TABS
5.0000 mg | ORAL_TABLET | ORAL | Status: DC | PRN
  Administered 2024-10-21 – 2024-10-22 (×3): 5 mg via ORAL
  Filled 2024-10-21 (×3): qty 1

## 2024-10-21 MED ORDER — MORPHINE SULFATE (PF) 2 MG/ML IV SOLN
2.0000 mg | INTRAVENOUS | Status: DC | PRN
  Administered 2024-10-22: 2 mg via INTRAVENOUS
  Filled 2024-10-21: qty 1

## 2024-10-21 MED ORDER — PANTOPRAZOLE SODIUM 40 MG PO TBEC
40.0000 mg | DELAYED_RELEASE_TABLET | Freq: Every day | ORAL | Status: DC
  Administered 2024-10-21 – 2024-10-22 (×2): 40 mg via ORAL
  Filled 2024-10-21 (×2): qty 1

## 2024-10-21 MED ORDER — DONEPEZIL HCL 5 MG PO TABS
10.0000 mg | ORAL_TABLET | Freq: Every day | ORAL | Status: DC
  Administered 2024-10-21: 10 mg via ORAL
  Filled 2024-10-21: qty 2

## 2024-10-21 MED ORDER — PROPOFOL 1000 MG/100ML IV EMUL
INTRAVENOUS | Status: AC
  Filled 2024-10-21: qty 100

## 2024-10-21 MED ORDER — POTASSIUM CHLORIDE CRYS ER 20 MEQ PO TBCR: 40.0000 meq | EXTENDED_RELEASE_TABLET | Freq: Every day | ORAL | Status: DC | PRN

## 2024-10-21 MED ORDER — SODIUM CHLORIDE 0.9% IV SOLUTION: Freq: Once | INTRAVENOUS | Status: DC

## 2024-10-21 MED ORDER — ASPIRIN 81 MG PO TBEC
81.0000 mg | DELAYED_RELEASE_TABLET | Freq: Every day | ORAL | Status: DC
  Administered 2024-10-21 – 2024-10-22 (×2): 81 mg via ORAL
  Filled 2024-10-21 (×2): qty 1

## 2024-10-21 MED ORDER — PHENYLEPHRINE 80 MCG/ML (10ML) SYRINGE FOR IV PUSH (FOR BLOOD PRESSURE SUPPORT)
PREFILLED_SYRINGE | INTRAVENOUS | Status: DC | PRN
  Administered 2024-10-21 (×2): 160 ug via INTRAVENOUS

## 2024-10-21 MED ORDER — CEFAZOLIN SODIUM-DEXTROSE 2-4 GM/100ML-% IV SOLN
2.0000 g | Freq: Three times a day (TID) | INTRAVENOUS | Status: AC
  Administered 2024-10-21 – 2024-10-22 (×2): 2 g via INTRAVENOUS
  Filled 2024-10-21 (×3): qty 100

## 2024-10-21 MED ORDER — NAPHAZOLINE-PHENIRAMINE 0.025-0.3 % OP SOLN: 1.0000 [drp] | Freq: Four times a day (QID) | OPHTHALMIC | Status: DC | PRN

## 2024-10-21 MED ORDER — PROPOFOL 10 MG/ML IV BOLUS
INTRAVENOUS | Status: DC | PRN
  Administered 2024-10-21: 120 mg via INTRAVENOUS

## 2024-10-21 MED ORDER — DOCUSATE SODIUM 100 MG PO CAPS
100.0000 mg | ORAL_CAPSULE | Freq: Every day | ORAL | Status: DC
  Administered 2024-10-22: 100 mg via ORAL
  Filled 2024-10-21: qty 1

## 2024-10-21 MED ORDER — SEVOFLURANE IN SOLN
RESPIRATORY_TRACT | Status: AC
  Filled 2024-10-21: qty 250

## 2024-10-21 MED ORDER — QUETIAPINE FUMARATE 25 MG PO TABS
25.0000 mg | ORAL_TABLET | Freq: Every day | ORAL | Status: DC
  Administered 2024-10-21: 25 mg via ORAL
  Filled 2024-10-21: qty 1

## 2024-10-21 MED ORDER — OXYCODONE HCL 5 MG PO TABS: 5.0000 mg | ORAL_TABLET | Freq: Once | ORAL | Status: DC | PRN

## 2024-10-21 MED ORDER — ROSUVASTATIN CALCIUM 10 MG PO TABS
20.0000 mg | ORAL_TABLET | Freq: Every day | ORAL | Status: DC
  Administered 2024-10-21: 20 mg via ORAL
  Filled 2024-10-21: qty 2

## 2024-10-21 MED ORDER — DULOXETINE HCL 30 MG PO CPEP
60.0000 mg | ORAL_CAPSULE | Freq: Every day | ORAL | Status: DC
  Administered 2024-10-22: 60 mg via ORAL
  Filled 2024-10-21: qty 2

## 2024-10-21 MED ORDER — VITAMIN B-12 1000 MCG PO TABS
1000.0000 ug | ORAL_TABLET | Freq: Every day | ORAL | Status: DC
  Administered 2024-10-21 – 2024-10-22 (×2): 1000 ug via ORAL
  Filled 2024-10-21 (×2): qty 1

## 2024-10-21 MED ORDER — CHLORHEXIDINE GLUCONATE CLOTH 2 % EX PADS
6.0000 | MEDICATED_PAD | Freq: Once | CUTANEOUS | Status: AC
  Administered 2024-10-21: 6 via TOPICAL

## 2024-10-21 MED ORDER — CHLORHEXIDINE GLUCONATE 0.12 % MT SOLN
15.0000 mL | Freq: Once | OROMUCOSAL | Status: AC
  Administered 2024-10-21: 15 mL via OROMUCOSAL
  Filled 2024-10-21: qty 15

## 2024-10-21 MED ORDER — ACETAMINOPHEN 325 MG PO TABS: 650.0000 mg | ORAL_TABLET | Freq: Three times a day (TID) | ORAL | Status: DC | PRN

## 2024-10-21 MED ORDER — DEXMEDETOMIDINE HCL IN NACL 200 MCG/50ML IV SOLN
INTRAVENOUS | Status: DC | PRN
Start: 2024-10-21 — End: 2024-10-21
  Administered 2024-10-21: 4 ug via INTRAVENOUS
  Administered 2024-10-21: 8 ug via INTRAVENOUS

## 2024-10-21 MED ORDER — FENTANYL CITRATE (PF) 100 MCG/2ML IJ SOLN
INTRAMUSCULAR | Status: DC | PRN
  Administered 2024-10-21: 100 ug via INTRAVENOUS
  Administered 2024-10-21 (×2): 50 ug via INTRAVENOUS

## 2024-10-21 MED ORDER — CHLORHEXIDINE GLUCONATE CLOTH 2 % EX PADS
6.0000 | MEDICATED_PAD | Freq: Every day | CUTANEOUS | Status: DC
  Administered 2024-10-21: 6 via TOPICAL

## 2024-10-21 MED ORDER — SUGAMMADEX SODIUM 200 MG/2ML IV SOLN
INTRAVENOUS | Status: DC | PRN
  Administered 2024-10-21: 300 mg via INTRAVENOUS

## 2024-10-21 MED ORDER — ESMOLOL HCL 100 MG/10ML IV SOLN
INTRAVENOUS | Status: DC | PRN
  Administered 2024-10-21: 30 mg via INTRAVENOUS

## 2024-10-21 MED ORDER — HEPARIN (PORCINE) IN NACL 1000-0.9 UT/500ML-% IV SOLN
INTRAVENOUS | Status: DC | PRN
  Administered 2024-10-21: 1500 mL

## 2024-10-21 MED ORDER — ROCURONIUM BROMIDE 100 MG/10ML IV SOLN
INTRAVENOUS | Status: DC | PRN
  Administered 2024-10-21: 50 mg via INTRAVENOUS
  Administered 2024-10-21: 30 mg via INTRAVENOUS
  Administered 2024-10-21: 50 mg via INTRAVENOUS

## 2024-10-21 SURGICAL SUPPLY — 38 items
BALLOON DORADO 10X80X80 (BALLOONS) IMPLANT
CATH ACCU-VU SIZ PIG 5F 70CM (CATHETERS) IMPLANT
CATH BALLN CODA 9X100X32 (BALLOONS) IMPLANT
CATH BEACON 5 .035 65 KMP TIP (CATHETERS) IMPLANT
CATH MICROCATH PRGRT 2.8F 130 (MICROCATHETER) IMPLANT
CATH TEMPO 5F RIM 65CM (CATHETERS) IMPLANT
CATH VS15FR (CATHETERS) IMPLANT
CLOSURE PERCLOSE PROSTYLE (Vascular Products) IMPLANT
COIL 400 COMPLEX STD 10X35CM (Vascular Products) IMPLANT
COIL 400 COMPLEX STD 12X60CM (Vascular Products) IMPLANT
COVER DRAPE FLUORO 36X44 (DRAPES) IMPLANT
COVER PROBE ULTRASOUND 5X96 (MISCELLANEOUS) IMPLANT
DERMABOND ADVANCED .7 DNX12 (GAUZE/BANDAGES/DRESSINGS) IMPLANT
DEVICE OCCLUSION PODJ60 (Vascular Products) IMPLANT
DEVICE PRESTO INFLATION (MISCELLANEOUS) IMPLANT
DEVICE SAFEGUARD 24CM (GAUZE/BANDAGES/DRESSINGS) IMPLANT
DEVICE TORQUE (MISCELLANEOUS) IMPLANT
EXCLUDER TNK END 36X14.5X14 18 (Endovascular Graft) IMPLANT
GLIDEWIRE STIFF .35X180X3 HYDR (WIRE) IMPLANT
HANDLE DETACHMENT COIL (MISCELLANEOUS) IMPLANT
LEG CONTRALATERAL 16X14.5X12 (Vascular Products) IMPLANT
LEG CONTRALATERAL 27X14 (Vascular Products) IMPLANT
NDL ENTRY 21GA 7CM ECHOTIP (NEEDLE) IMPLANT
NEEDLE ENTRY 21GA 7CM ECHOTIP (NEEDLE) IMPLANT
PACK ANGIOGRAPHY (CUSTOM PROCEDURE TRAY) ×1 IMPLANT
PACK BASIN MAJOR ARMC (MISCELLANEOUS) IMPLANT
SET INTRO CAPELLA COAXIAL (SET/KITS/TRAYS/PACK) IMPLANT
SHEATH BRITE TIP 6FRX11 (SHEATH) IMPLANT
SHEATH BRITE TIP 8FRX11 (SHEATH) IMPLANT
SHEATH DRYSEAL FLEX 14FR 33CM (SHEATH) IMPLANT
SHEATH DRYSEAL FLEX 18FR 33CM (SHEATH) IMPLANT
SUT MNCRL AB 4-0 PS2 18 (SUTURE) IMPLANT
SYR MEDRAD MARK 7 150ML (SYRINGE) IMPLANT
TRAY FOLEY SLVR 16FR LF STAT (SET/KITS/TRAYS/PACK) IMPLANT
TUBING CONTRAST HIGH PRESS 72 (TUBING) IMPLANT
WIRE AMPLATZ SSTIFF .035X260CM (WIRE) IMPLANT
WIRE G LUND 35X260X7 (WIRE) IMPLANT
WIRE J 3MM .035X145CM (WIRE) IMPLANT

## 2024-10-21 NOTE — Op Note (Signed)
 OPERATIVE NOTE   PROCEDURE: US  guidance for vascular access, bilateral femoral arteries Catheter placement into aorta from bilateral femoral approaches Catheter placement left internal iliac artery second-order catheter placement left femoral approach with selective angiography Coil embolization of the left internal iliac artery for exclusion of the left common iliac artery aneurysmal disease Placement of a 36 x 14 x 14 Gore Excluder Endoprosthesis main body with a 27 x 14 contralateral limb. Placement of a 14 mm x 12 cm left iliac extension limb into the left external iliac artery for exclusion of the common iliac artery aneurysmal disease ProGlide closure devices bilateral femoral arteries  PRE-OPERATIVE DIAGNOSIS: AAA  POST-OPERATIVE DIAGNOSIS: same  SURGEON: Albert Shawl, MD and Selinda Gu, MD - Co-surgeons  ANESTHESIA: general  ESTIMATED BLOOD LOSS: 150 cc  FINDING(S): 1.  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 is a 78 y.o. y.o. male who presents with presents with an abdominal aortic aneurysm that is greater than 5 cm placing him at risk for lethal rupture.  The anatomy is suitable for endovascular repair.  Risks and benefits for repair of the abdominal aortic aneurysm using an endograft was described in detail to the patient all questions have been answered patient agrees to proceed.  Co-surgeons are utilized to expedite the procedure and reduce operative time improving patient's safety and improving outcome.  DESCRIPTION: After obtaining full informed written consent, the patient was brought back to the operating room and placed supine upon the operating table.  The patient received IV antibiotics prior to induction.  After obtaining adequate anesthesia, the patient was prepped and draped in the standard fashion for endovascular AAA repair.  Co-surgeons  are required because this is a complex bilateral procedure with work being performed simultaneously from both the right femoral and left femoral approach.  This also expedites the procedure making a shorter operative time reducing complications and improving patient safety.  We then began by gaining access to both femoral arteries with US  guidance with me working on the patient's right and Dr. Gu working on the patient's left.  The femoral arteries were found to be patent and accessed without difficulty with a needle under ultrasound guidance without difficulty on each side and permanent images were recorded.  We then placed 2 proglide devices on each side in a pre-close fashion and placed 8 French sheaths.  Working from the left side using a rim catheter the origin of the left internal iliac artery was selected.  This was verified by hand-injection.  A Progreat catheter was then advanced out into the second-order and magnified imaging was obtained.  Then 3 Ruby coils were placed in serial fashion to occlude the proximal internal iliac artery on the left.  The initial coil was a 10 mm x 35 cm standard followed by a 12 mm x 60 cm standard and lastly a 60 cm packing coil was placed.  Follow-up imaging demonstrated excellent result.  The patient was then given 7000 units of intravenous heparin .  An additional 2000 units were given at just over the 1 hour mark  The Pigtail catheter was placed into the aorta from the right side. Using this image, we selected a a 36 x 14 x 14 conformable C3 Main body device.  Over a stiff wire, an 16 French sheath was placed. The main body was then placed through the 16 French sheath. A  Kumpe catheter was placed up the right side and a magnified image at the renal arteries was performed.  Next the pigtail catheter was exchanged for a V S1 catheter and the V S1 catheter was used to select the renal artery.  Wire was then advanced and the V S1 catheter removed and the main body was  then deployed just below the lowest renal artery. The Kumpe catheter was used to cannulate the contralateral gate without difficulty and successful cannulation was confirmed by twirling the pigtail catheter in the main body. We then placed a stiff wire and a retrograde arteriogram was performed through the right femoral sheath. We upsized to the 14 French sheath for the contralateral limb and a 27 x 14 limb was selected and deployed. The main body deployment was then completed. Based off the angiographic findings, extension limbs were necessary on the left.  A 14 x 12 iliac extension limb was then deployed on the left extending into the external iliac to exclude the common iliac artery aneurysm.  210 mm balloons were then used to dilate the flow divider as the left limb was somewhat impinged by the atherosclerotic occlusive disease.  All junction points and seals zones were treated with the compliant balloon.   The pigtail catheter was then replaced and a completion angiogram was performed.   Type I endoleak was detected on completion angiography. The renal arteries were found to be widely patent.  The Coda balloon was then reintroduced up the left side and used to seal the top of the graft.  Follow-up imaging demonstrated elimination of the previously noted endoleak.  There does appear to be a faint type II endoleak very late.  At this point we elected to terminate the procedure. We secured the pro glide devices for hemostasis on the femoral arteries. The skin incision was closed with a 4-0 Monocryl. Dermabond and pressure dressing were placed. The patient was taken to the recovery room in stable condition having tolerated the procedure well.  COMPLICATIONS: none  CONDITION: stable  Albert [name]  10/21/2024, 3:39 PM

## 2024-10-21 NOTE — Anesthesia Postprocedure Evaluation (Signed)
 Anesthesia Post Note  Patient: Albert [name]  Procedure(s) Performed: ENDOVASCULAR STENT GRAFT (AAA)  Patient location during evaluation: PACU Anesthesia Type: General Level of consciousness: awake and alert Pain management: pain level controlled Vital Signs Assessment: post-procedure vital signs reviewed and stable Respiratory status: spontaneous breathing, nonlabored ventilation, respiratory function stable and patient connected to nasal cannula oxygen Cardiovascular status: blood pressure returned to baseline and stable Postop Assessment: no apparent nausea or vomiting Anesthetic complications: no   No notable events documented.   Last Vitals:  Vitals:   10/21/24 1545 10/21/24 1600  BP: 133/75 (!) 142/83  Pulse: 77 85  Resp: 19 19  Temp:  (!) 36.2 C  SpO2: 100% 99%    Last Pain:  Vitals:   10/21/24 1600  TempSrc:   PainSc: Asleep                 Lendia LITTIE Mae

## 2024-10-21 NOTE — Op Note (Signed)
 OPERATIVE NOTE   PROCEDURE: US  guidance for vascular access, bilateral femoral arteries Catheter placement into aorta from bilateral femoral approaches Catheter placement to left internal iliac artery from left femoral approach and selective left internal iliac artery angiogram Coil embolization of the left internal iliac artery with a total of 3 Ruby coils Placement of a 36 mm diameter conformable Gore C3 excluder endoprosthesis Gore Excluder Endoprosthesis main body left with a 27 mm diameter by 14 cm length right iliac contralateral limb Placement of a 14 mm diameter by 12 cm length left iliac extension limb into the left external iliac artery for aneurysmal exclusion ProGlide closure devices bilateral femoral arteries  PRE-OPERATIVE DIAGNOSIS: AAA  POST-OPERATIVE DIAGNOSIS: same  SURGEON: Selinda Gu, MD and Cordella Shawl, MD - Co-surgeons  ANESTHESIA: General  ESTIMATED BLOOD LOSS: 50 cc  FINDING(S): 1.  AAA  SPECIMEN(S):  none  INDICATIONS:   Dow Albert [name] is a 78 y.o. male who presents with a greater than 5 cm abdominal aortic aneurysm putting him at risk for lethal rupture. The anatomy was suitable for endovascular repair with concomitant left internal iliac artery coil embolization.  Risks and benefits of repair in an endovascular fashion were discussed and informed consent was obtained. Co-surgeons are used to expedite the procedure and reduce operative time as bilateral work needs to be done.  DESCRIPTION: After obtaining full informed written consent, the patient was brought back to the operating room and placed supine upon the operating table.  The patient received IV antibiotics prior to induction.  After obtaining adequate anesthesia, the patient was prepped and draped in the standard fashion for endovascular AAA repair.  We then began by gaining access to both femoral arteries with US  guidance with me working on the left and Dr. Shawl working on the right.   The femoral arteries were found to be patent and accessed without difficulty with a needle under ultrasound guidance without difficulty on each side and permanent images were recorded.  We then placed 2 proglide devices on each side in a pre-close fashion and placed 8 French sheaths. The patient was then given 7000 units of intravenous heparin .  I began by performing imaging through the left femoral sheath in a steep RAO projection to evaluate the left internal iliac artery.  I then used a rim catheter to cannulate the left internal iliac artery where selective imaging is performed.  The Progreat microcatheter was take out to the primary branches of the left internal iliac artery.  I then began coil embolization of the left internal iliac artery which would be required to avoid backbleeding and endoleak with aneurysmal extension to the left internal iliac artery and the common iliac artery.  A total of 3 Ruby coils were placed.  The first was 10 mm in diameter by 35 cm in length.  The second was 12 mm in diameter by 60 cm in length.  Finally, 60 cm packing coil was placed.  Completion imaging through the rim catheter showed successful coil embolization of the left internal iliac artery.  At this point, the Pigtail catheter was placed into the aorta from the right side. Using this image, we selected a 36 mm diameter conformable Gore excluder C3 Main body device.  His iliac arteries and aorta had a marked amount of tortuosity, and we elected to use a Lunderquist wire for the main body.  Over a Lunderquist wire, an 71 French sheath was placed. The main body was then placed through the  18 French sheath on the left side. A Kumpe catheter was placed up the right side and a magnified image at the renal arteries was performed. The main body was then deployed just below the lowest renal artery which was the left renal artery. The Kumpe catheter was used to cannulate the contralateral gate without difficulty and successful  cannulation was confirmed by twirling the pigtail catheter in the main body.  The pigtail catheter was then replaced into the aorta above the stent graft and imaging was performed.  Off of this, we reconstrained the stent graft and advance this about 3 mm proximally to the base of the left renal artery.  We then placed a stiff wire and a retrograde arteriogram was performed through the right femoral sheath. We upsized to the 14 French sheath for the contralateral limb and a 27 mm diameter by 14 cm length right iliac limb was selected and deployed. The main body deployment was then completed. Based off the angiographic findings, extension limbs were necessary.  A 14 mm diameter by 12 cm length left iliac extension limb was taken down about 4 to 5 cm into the left external iliac artery to completely exclude the aneurysm in the common iliac artery below the level of the internal iliac artery coil embolization. All junction points and seals zones were treated with the compliant balloon.  There was initially some constraint in the midportion of the stent graft at the level of the flow divider.  We took 10 mm diameter high-pressure angioplasty balloons up to each side and perform kissing balloon angioplasty in this location to ironed that out.  The pigtail catheter was then replaced and a completion angiogram was performed.  Initially, there appeared to be a possible type I endoleak proximally.  We then took the compliant balloon up and inflated the Coda balloon up at the top edge of the stent graft which seemed to improve our seal.  The pigtail catheter was replaced and follow-up imaging was performed.  There was no further type I or III endoleak.  There did appear to be a type II endoleak that was detected on completion angiography.  This was relatively small and the reentry point was down near the aortic bifurcation through lumbar vessels.  The renal arteries were found to be widely patent.  The right internal iliac  artery remained widely patent.  The left internal iliac artery had been successfully coil embolized and covered. At this point we elected to terminate the procedure. We secured the pro glide devices for hemostasis on the femoral arteries. The skin incision was closed with a 4-0 Monocryl. Dermabond and pressure dressing were placed. The patient was taken to the recovery room in stable condition having tolerated the procedure well.  COMPLICATIONS: none  CONDITION: stable  Selinda Gu  10/21/2024, 3:18 PM   This note was created with Dragon Medical transcription system. Any errors in dictation are purely unintentional.

## 2024-10-21 NOTE — Transfer of Care (Signed)
 Immediate Anesthesia Transfer of Care Note  Patient: Albert [name]  Procedure(s) Performed: ENDOVASCULAR STENT GRAFT (AAA)  Patient Location: PACU  Anesthesia Type:General  Level of Consciousness: awake, drowsy, and patient cooperative  Airway & Oxygen Therapy: Patient Spontanous Breathing and Patient connected to face mask oxygen  Post-op Assessment: Report given to RN and Post -op Vital signs reviewed and stable  Post vital signs: Reviewed and stable  Last Vitals:  Vitals Value Taken Time  BP 150/90 10/21/24 15:33  Temp 36.2 C 10/21/24 15:33  Pulse 82 10/21/24 15:38  Resp 10 10/21/24 15:38  SpO2 100 % 10/21/24 15:38  Vitals shown include unfiled device data.  Last Pain:  Vitals:   10/21/24 1115  TempSrc: Temporal  PainSc: 0-No pain         Complications: No notable events documented.

## 2024-10-21 NOTE — H&P (Signed)
 [facility] Admission History & Physical  MRN : [id number]  Albert [name] is a 78 y.o. (01-05-1946) male who presents with chief complaint of No chief complaint on file. [name]  History of Present Illness: Patient presents for his aneurysm repair today.  No major changes since his office visit about 6 weeks ago.  Current Facility-Administered Medications  Medication Dose Route Frequency Provider Last Rate Last Admin   0.9 %  sodium chloride  infusion (Manually program via Guardrails IV Fluids)   Intravenous Once Pace, Brien R, NP       0.9 %  sodium chloride  infusion (Manually program via Guardrails IV Fluids)   Intravenous Once Madie Cahn S, MD       ceFAZolin  (ANCEF ) IVPB 2g/100 mL premix  2 g Intravenous On Call to OR Brown, Fallon E, NP       Chlorhexidine  Gluconate Cloth 2 % PADS 6 each  6 each Topical Once Brown, Fallon E, NP       HYDROmorphone  (DILAUDID ) injection 1 mg  1 mg Intravenous Once PRN Brown, Fallon E, NP       lactated ringers  infusion   Intravenous Continuous Elnor Dorise BRAVO, NP 10 mL/hr at 10/21/24 1133 New Bag at 10/21/24 1133   ondansetron  (ZOFRAN ) injection 4 mg  4 mg Intravenous Q6H PRN Brown, Fallon E, NP        Past Medical History:  Diagnosis Date   Anemia    Anxiety    a.) on BZO (alproazolam) PRN   Arthritis of both knees    Back pain    Barrett esophagus    Chronic back pain    Dementia (HCC)    arising in the senium and presenium   Displaced fracture of proximal phalanx of right lesser toe(s), initial encounter for closed fracture    Diverticulosis    GERD (gastroesophageal reflux disease)    OTC meds as needed   Headache    History of colon polyps    benign    History of kidney stones    History of migraine    Hyperlipidemia    Hypertension    Insomnia    Joint pain    Nephrolithiasis    Pre-diabetes    Stroke [facility])    without residual deficits    Past Surgical History:  Procedure Laterality Date   AMPUTATION  TOE Right 04/13/2022   Procedure: 28820 - AMPUTATION TOE;  Surgeon: Ashley Soulier, DPM;  Location: [facility] ORS;  Service: Podiatry;  Laterality: Right;   ANKLE ARTHROSCOPY Right 04/13/2022   Procedure: A-SCOPE/OCD REPAIR;  Surgeon: Ashley Soulier, DPM;  Location: [facility] OR[facility];  Service: Podiatry;  Laterality: Right;   ANKLE SURGERY Right 1997   ankles/screws    ARTHROSCOPIC REPAIR ACL Right 1993   BACK SURGERY  2017   titanium rods and pins in lower back   COLONOSCOPY     EPIDIDYMECTOMY Left 04/10/2017   Procedure: EPIDIDYMECTOMY;  Surgeon: Rosina Riis, MD;  Location: [facility] ORS;  Service: Urology;  Laterality: Left;   ESOPHAGOGASTRODUODENOSCOPY     LUMBAR FUSION  March 13, 2016   L4/L5 fusion   MINOR HARDWARE REMOVAL Right 04/13/2022   Procedure: REMOVAL SCREW; DEEP;  Surgeon: Ashley Soulier, DPM;  Location: [facility] ORS;  Service: Podiatry;  Laterality: Right;   TONSILLECTOMY  1962   adenoidectomy    VASECTOMY  1973   WOUND DEBRIDEMENT Right 04/13/2022   Procedure: 29906 - SUBTALAR ARTHROSCOPY;  Surgeon: Ashley Soulier, DPM;  Location:  [facility] ORS;  Service: Podiatry;  Laterality: Right;     Social History   Tobacco Use   Smoking status: Every Day    Current packs/day: 0.50    Average packs/day: 0.5 packs/day for 50.0 years (25.0 ttl pk-yrs)    Types: Cigarettes   Smokeless tobacco: Never   Tobacco comments:    Half a pack a day, per wife.  Vaping Use   Vaping status: Never Used  Substance Use Topics   Alcohol use: No   Drug use: No     Family History  Problem Relation Age of Onset   Arthritis Mother    Esophageal cancer Father    Prostate cancer Neg Hx    Bladder Cancer Neg Hx    Kidney cancer Neg Hx     Allergies  Allergen Reactions   Codeine Nausea And Vomiting        REVIEW OF SYSTEMS (Negative unless checked)   Constitutional: [] Weight loss  [] Fever  [] Chills Cardiac: [] Chest pain   [] Chest pressure   [] Palpitations   [] Shortness of breath when laying flat    [] Shortness of breath at rest   [] Shortness of breath with exertion. Vascular:  [] Pain in legs with walking   [] Pain in legs at rest   [] Pain in legs when laying flat   [] Claudication   [] Pain in feet when walking  [] Pain in feet at rest  [] Pain in feet when laying flat   [] History of DVT   [] Phlebitis   [] Swelling in legs   [] Varicose veins   [] Non-healing ulcers Pulmonary:   [] Uses home oxygen   [] Productive cough   [] Hemoptysis   [] Wheeze  [] COPD   [] Asthma Neurologic:  [] Dizziness  [] Blackouts   [] Seizures   [x] History of stroke   [] History of TIA  [] Aphasia   [] Temporary blindness   [] Dysphagia   [] Weakness or numbness in arms   [x] Weakness or numbness in legs Musculoskeletal:  [x] Arthritis   [] Joint swelling   [] Joint pain   [x] Low back pain Hematologic:  [] Easy bruising  [] Easy bleeding   [] Hypercoagulable state   [] Anemic  [] Hepatitis Gastrointestinal:  [] Blood in stool   [] Vomiting blood  [x] Gastroesophageal reflux/heartburn   [] Abdominal pain Genitourinary:  [] Chronic kidney disease   [] Difficult urination  [] Frequent urination  [] Burning with urination   [] Hematuria Skin:  [] Rashes   [] Ulcers   [] Wounds Psychological:  [x] History of anxiety   []  History of major depression.    Physical Examination  Vitals:   10/21/24 1115  BP: (!) 145/102  Pulse: 75  Resp: 20  Temp: (!) 97.3 F (36.3 C)  TempSrc: Temporal  SpO2: 96%   There is no height or weight on file to calculate BMI. Gen: WD/WN, NAD Head: [place]/AT, No temporalis wasting.  Ear/Nose/Throat: Hearing grossly intact, nares w/o erythema or drainage, oropharynx w/o Erythema/Exudate,  Eyes: Conjunctiva clear, sclera non-icteric Neck: Trachea midline.  No JVD.  Pulmonary:  Good air movement, respirations not labored, no use of accessory muscles.  Cardiac: RRR, normal S1, S2. Vascular:  Vessel Right Left  Radial Palpable Palpable               Musculoskeletal: M/S 5/5 throughout.  Extremities without ischemic changes.  No  deformity or atrophy. Mild BLE edema. Neurologic: Sensation grossly intact in extremities.  Symmetrical.  Speech is fluent. Motor exam as listed above. Psychiatric: Judgment intact, Mood & affect appropriate for pt's clinical situation. Dermatologic: No rashes or ulcers noted.  No cellulitis or open wounds. Lymph :  No Cervical, Axillary, or Inguinal lymphadenopathy.     CBC Lab Results  Component Value Date   WBC 7.8 10/20/2024   HGB 10.9 (L) 10/20/2024   HCT 33.6 (L) 10/20/2024   MCV 91.8 10/20/2024   PLT 294 10/20/2024    BMET    Component Value Date/Time   NA 144 10/20/2024 0920   NA 140 09/05/2014 1506   K 4.0 10/20/2024 0920   K 3.7 09/05/2014 1506   CL 105 10/20/2024 0920   CL 107 09/05/2014 1506   CO2 27 10/20/2024 0920   CO2 26 09/05/2014 1506   GLUCOSE 100 (H) 10/20/2024 0920   GLUCOSE 125 (H) 09/05/2014 1506   BUN 27 (H) 10/20/2024 0920   BUN 23 (H) 09/05/2014 1506   CREATININE 1.02 10/20/2024 0920   CREATININE 1.08 09/05/2014 1506   CALCIUM  9.1 10/20/2024 0920   CALCIUM  9.9 09/05/2014 1506   GFRNONAA >60 10/20/2024 0920   GFRNONAA >60 09/05/2014 1506   GFRAA >60 03/07/2016 1026   GFRAA >60 09/05/2014 1506   Estimated Creatinine Clearance: 68.5 mL/min (by C-G formula based on SCr of 1.02 mg/dL).  COAG Lab Results  Component Value Date   INR 1.1 06/23/2024    Radiology No results found.   Assessment/Plan HTN, goal below 140/90 blood pressure control important in reducing the progression of atherosclerotic disease and aneurysmal growth. On appropriate oral medications.     Hypercholesterolemia lipid control important in reducing the progression of atherosclerotic disease. Continue statin therapy     AAA (abdominal aortic aneurysm) without rupture He has undergone a CT angiogram of the abdomen pelvis which I have independently reviewed.  The official report is of a 5.3 cm infrarenal abdominal aortic aneurysm with extension down into the iliac  arteries.  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  There also appears to be a penetrating ulcer in the aortic aneurysm itself and it looks very irregular in the distal infrarenal aorta.  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  He has 2 renal arteries bilaterally.  I would generally agree with the report although I think it might be slightly larger than the 5.3 cm reported. His anatomy is amenable to endovascular repair although we will likely have to perform coil embolization of the right internal iliac artery and extended the right external iliac artery.  He also does not have a particularly long neck and it is possible we will have to snorkel into the main left renal artery to get a proximal seal.  He also has constrained area in the mid abdominal aorta above the penetrating ulcer and we will have to watch where the gate opens for cannulation.  These will create some increased challenge, but given the ugly appearance of the penetrating ulcer and a greater than 5 cm abdominal aortic aneurysm this is clearly at high risk of rupture and needs to be repaired.  He is not a great candidate for open repair.  I have discussed this with he and his wife today and they desire to proceed with repair as soon as possible.  We will work on getting cardiac clearance in the near future.  Selinda Gu, MD  10/21/2024 12:06 PM

## 2024-10-21 NOTE — Anesthesia Preprocedure Evaluation (Signed)
 Anesthesia Evaluation  Patient identified by MRN, date of birth, ID band Patient awake and Patient confused    Reviewed: Allergy & Precautions, NPO status , Patient's Chart, lab work & pertinent test results  History of Anesthesia Complications Negative for: history of anesthetic complications  Airway Mallampati: III  TM Distance: <3 FB Neck ROM: full    Dental  (+) Chipped, Poor Dentition, Missing   Pulmonary neg shortness of breath, Current Smoker   Pulmonary exam normal        Cardiovascular hypertension, (-) angina (-) Past MI negative cardio ROS Normal cardiovascular exam     Neuro/Psych  Headaches      Dementia CVA    GI/Hepatic Neg liver ROS,GERD  Controlled,,  Endo/Other  negative endocrine ROS    Renal/GU Renal disease     Musculoskeletal   Abdominal   Peds  Hematology negative hematology ROS (+)   Anesthesia Other Findings Past Medical History: No date: Anemia No date: Anxiety     Comment:  a.) on BZO (alproazolam) PRN No date: Arthritis of both knees No date: Back pain No date: Barrett esophagus No date: Chronic back pain No date: Dementia (HCC)     Comment:  arising in the senium and presenium No date: Displaced fracture of proximal phalanx of right lesser  toe(s), initial encounter for closed fracture No date: Diverticulosis No date: GERD (gastroesophageal reflux disease)     Comment:  OTC meds as needed No date: Headache No date: History of colon polyps     Comment:  benign  No date: History of kidney stones No date: History of migraine No date: Hyperlipidemia No date: Hypertension No date: Insomnia No date: Joint pain No date: Nephrolithiasis No date: Pre-diabetes No date: Stroke [facility])     Comment:  without residual deficits  Past Surgical History: 04/13/2022: AMPUTATION TOE; Right     Comment:  Procedure: 28820 - AMPUTATION TOE;  Surgeon: Ashley Soulier, DPM;   Location: [facility] OR[facility];  Service: Podiatry;                Laterality: Right; 04/13/2022: ANKLE ARTHROSCOPY; Right     Comment:  Procedure: A-SCOPE/OCD REPAIR;  Surgeon: Ashley Soulier,              DPM;  Location: [facility];  Service: Podiatry;                Laterality: Right; 1997: ANKLE SURGERY; Right     Comment:  ankles/screws  1993: ARTHROSCOPIC REPAIR ACL; Right 2017: BACK SURGERY     Comment:  titanium rods and pins in lower back No date: COLONOSCOPY 04/10/2017: EPIDIDYMECTOMY; Left     Comment:  Procedure: EPIDIDYMECTOMY;  Surgeon: Rosina Riis, MD;              Location: [facility];  Service: Urology;  Laterality: Left; No date: ESOPHAGOGASTRODUODENOSCOPY March 13, 2016: LUMBAR FUSION     Comment:  L4/L5 fusion 04/13/2022: MINOR HARDWARE REMOVAL; Right     Comment:  Procedure: REMOVAL SCREW; DEEP;  Surgeon: Ashley Soulier, DPM;  Location: [facility];  Service: Podiatry;                Laterality: Right; 1962: TONSILLECTOMY     Comment:  adenoidectomy  1973: VASECTOMY 04/13/2022: WOUND DEBRIDEMENT; Right     Comment:  Procedure: 29906 -  SUBTALAR ARTHROSCOPY;  Surgeon:               Ashley Soulier, DPM;  Location: [facility] OR[facility];  Service:               Podiatry;  Laterality: Right;     Reproductive/Obstetrics negative OB ROS                              Anesthesia Physical Anesthesia Plan  ASA: 3  Anesthesia Plan: General ETT   Post-op Pain Management:    Induction: Intravenous  PONV Risk Score and Plan: Ondansetron , Dexamethasone , Midazolam  and Treatment may vary due to age or medical condition  Airway Management Planned: Oral ETT  Additional Equipment:   Intra-op Plan:   Post-operative Plan: Extubation in OR  Informed Consent: I have reviewed the patients History and Physical, chart, labs and discussed the procedure including the risks, benefits and alternatives for the proposed anesthesia with the patient or authorized  representative who has indicated his/her understanding and acceptance.     Dental Advisory Given  Plan Discussed with: Anesthesiologist, CRNA and Surgeon  Anesthesia Plan Comments: (Patient and wife consented for risks of anesthesia including but not limited to:  - adverse reactions to medications - damage to eyes, teeth, lips or other oral mucosa - nerve damage due to positioning  - sore throat or hoarseness - Damage to heart, brain, nerves, lungs, other parts of body or loss of life  They voiced understanding and assent.)        Anesthesia Quick Evaluation

## 2024-10-21 NOTE — Anesthesia Procedure Notes (Signed)
 Procedure Name: Intubation Date/Time: 10/21/2024 12:56 PM  Performed by: Brien Sotero PARAS, CRNAPre-anesthesia Checklist: Patient identified, Patient being monitored, Timeout performed, Emergency Drugs available and Suction available Patient Re-evaluated:Patient Re-evaluated prior to induction Oxygen Delivery Method: Circle system utilized Preoxygenation: Pre-oxygenation with 100% oxygen Induction Type: IV induction Ventilation: Mask ventilation without difficulty Laryngoscope Size: Mac and 3 Grade View: Grade I Tube type: Oral Tube size: 7.0 mm Number of attempts: 1 Airway Equipment and Method: Stylet Placement Confirmation: ETT inserted through vocal cords under direct vision, positive ETCO2 and breath sounds checked- equal and bilateral Secured at: 22 cm Tube secured with: Tape Dental Injury: Teeth and Oropharynx as per pre-operative assessment

## 2024-10-22 ENCOUNTER — Encounter: Payer: Self-pay | Admitting: Vascular Surgery

## 2024-10-22 DIAGNOSIS — Z9889 Other specified postprocedural states: Secondary | ICD-10-CM

## 2024-10-22 DIAGNOSIS — Z95828 Presence of other vascular implants and grafts: Secondary | ICD-10-CM

## 2024-10-22 DIAGNOSIS — I714 Abdominal aortic aneurysm, without rupture, unspecified: Secondary | ICD-10-CM

## 2024-10-22 LAB — CBC
HCT: 29.8 % — ABNORMAL LOW (ref 39.0–52.0)
Hemoglobin: 9.4 g/dL — ABNORMAL LOW (ref 13.0–17.0)
MCH: 29.9 pg (ref 26.0–34.0)
MCHC: 31.5 g/dL (ref 30.0–36.0)
MCV: 94.9 fL (ref 80.0–100.0)
Platelets: 219 K/uL (ref 150–400)
RBC: 3.14 MIL/uL — ABNORMAL LOW (ref 4.22–5.81)
RDW: 15.2 % (ref 11.5–15.5)
WBC: 10.1 K/uL (ref 4.0–10.5)
nRBC: 0 % (ref 0.0–0.2)

## 2024-10-22 LAB — BASIC METABOLIC PANEL WITH GFR
Anion gap: 11 (ref 5–15)
BUN: 21 mg/dL (ref 8–23)
CO2: 24 mmol/L (ref 22–32)
Calcium: 8.3 mg/dL — ABNORMAL LOW (ref 8.9–10.3)
Chloride: 106 mmol/L (ref 98–111)
Creatinine, Ser: 1.11 mg/dL (ref 0.61–1.24)
GFR, Estimated: >60 mL/min (ref >60)
Glucose, Bld: 126 mg/dL — ABNORMAL HIGH (ref 70–99)
Potassium: 3.7 mmol/L (ref 3.5–5.1)
Sodium: 141 mmol/L (ref 135–145)

## 2024-10-22 NOTE — Progress Notes (Signed)
 Patient discharged home with wife. Pt VSS, AxOx1 (baseline). All patient belongings kept at bedside returned to patient. Discharge paperwork placed in discharge packet. Discharge instructions/education provided to patient's wife.

## 2024-10-22 NOTE — Discharge Summary (Signed)
 [facility]    Discharge Summary    Patient ID:  Albert [name] MRN: [id number] DOB/AGE: [birth year] 78 y.o.  Admit date: 10/21/2024 Discharge date: 10/22/2024 Date of Surgery: 10/21/2024 Surgeon: Surgeon(s): Dew, Selinda RAMAN, MD Schnier, Cordella MATSU, MD  Admission Diagnosis: Abdominal aortic aneurysm (AAA) without rupture, unspecified part [I71.40] AAA (abdominal aortic aneurysm) without rupture [I71.40]  Discharge Diagnoses:  Abdominal aortic aneurysm (AAA) without rupture, unspecified part [I71.40] AAA (abdominal aortic aneurysm) without rupture [I71.40]  Secondary Diagnoses: Past Medical History:  Diagnosis Date   Anemia    Anxiety    a.) on BZO (alproazolam) PRN   Arthritis of both knees    Back pain    Barrett esophagus    Chronic back pain    Dementia (HCC)    arising in the senium and presenium   Displaced fracture of proximal phalanx of right lesser toe(s), initial encounter for closed fracture    Diverticulosis    GERD (gastroesophageal reflux disease)    OTC meds as needed   Headache    History of colon polyps    benign    History of kidney stones    History of migraine    Hyperlipidemia    Hypertension    Insomnia    Joint pain    Nephrolithiasis    Pre-diabetes    Stroke (HCC)    without residual deficits    Procedure(s): ENDOVASCULAR STENT GRAFT (AAA)  Discharged Condition: good  HPI: Albert [name] is a 78 y.o. male.  I am asked to see the patient by Debby Amber for evaluation of an abdominal aortic aneurysm.  The patient and his wife have known about this since his back surgery back in 2017.  He has not had any obvious aneurysm related symptoms. Specifically, the patient denies new back or abdominal pain, or signs of peripheral embolization.  He recently underwent an MRI of the back which I have independently reviewed.  On this study, there was an incidental finding of an approximately 5 cm infrarenal abdominal  aortic aneurysm.  As such, he is referred for further evaluation and treatment.  Back in 2017, this was just over 3 cm in diameter.   Albert [name] is now postop day 1 from:  PROCEDURE: US  guidance for vascular access, bilateral femoral arteries Catheter placement into aorta from bilateral femoral approaches Catheter placement left internal iliac artery second-order catheter placement left femoral approach with selective angiography Coil embolization of the left internal iliac artery for exclusion of the left common iliac artery aneurysmal disease Placement of a 36 x 14 x 14 Gore Excluder Endoprosthesis main body with a 27 x 14 contralateral limb. Placement of a 14 mm x 12 cm left iliac extension limb into the left external iliac artery for exclusion of the common iliac artery aneurysmal disease ProGlide closure devices bilateral femoral arteries   Hospital Course:  Albert [name] is a 78 y.o. male is S/P [facility]  PROCEDURE: US  guidance for vascular access, bilateral femoral arteries Catheter placement into aorta from bilateral femoral approaches Catheter placement left internal iliac artery second-order catheter placement left femoral approach with selective angiography Coil embolization of the left internal iliac artery for exclusion of the left common iliac artery aneurysmal disease Placement of a 36 x 14 x 14 Gore Excluder Endoprosthesis main body with a 27 x 14 contralateral limb. Placement of a 14 mm x 12 cm left iliac extension limb into the left external iliac  artery for exclusion of the common iliac artery aneurysmal disease ProGlide closure devices bilateral femoral arteries  Patient tolerated the procedure well.  Recovering as expected.  Patient is ambulating, voiding and eating well without complication.  Bilateral groins are clean dry and intact with dressings.  No hematoma seroma to note.  Patient is being discharged today on aspirin  81 mg daily, Plavix  75 mg daily and  Crestor  20 mg daily.  Patient and wife were instructed not to miss or skip taking any of these medications as it will affect the outcome of surgery.  I spent greater than 60 minutes in developing, implementing, teaching and discharging this patient today.  Extubated: POD # 0 Physical Exam:  Alert notes x3, no acute distress Face: Symmetrical.  Tongue is midline. Neck: Trachea is midline.  No swelling or bruising. Cardiovascular: Regular rate and rhythm Pulmonary: Clear to auscultation bilaterally Abdomen: Soft, nontender, nondistended Right groin access: Clean dry and intact.  No swelling or drainage noted Left groin access: Clean dry and intact.  No swelling or drainage noted Left lower extremity: Thigh soft.  Calf soft.  Extremities warm distally toes.  Hard to palpate pedal pulses however the foot is warm is her good capillary refill. Right lower extremity: Thigh soft.  Calf soft.  Extremities warm distally toes.  Hard to palpate pedal pulses however the foot is warm is her good capillary refill. Neurological: No deficits noted   Post-op wounds:  clean, dry, intact or healing well  Pt. Ambulating, voiding and taking PO diet without difficulty. Pt pain controlled with PO pain meds.  Labs:  As below  Complications: none  Consults:    Significant Diagnostic Studies: CBC Lab Results  Component Value Date   WBC 10.1 10/22/2024   HGB 9.4 (L) 10/22/2024   HCT 29.8 (L) 10/22/2024   MCV 94.9 10/22/2024   PLT 219 10/22/2024    BMET    Component Value Date/Time   NA 141 10/22/2024 0614   NA 140 09/05/2014 1506   K 3.7 10/22/2024 0614   K 3.7 09/05/2014 1506   CL 106 10/22/2024 0614   CL 107 09/05/2014 1506   CO2 24 10/22/2024 0614   CO2 26 09/05/2014 1506   GLUCOSE 126 (H) 10/22/2024 0614   GLUCOSE 125 (H) 09/05/2014 1506   BUN 21 10/22/2024 0614   BUN 23 (H) 09/05/2014 1506   CREATININE 1.11 10/22/2024 0614   CREATININE 1.08 09/05/2014 1506   CALCIUM  8.3 (L)  10/22/2024 0614   CALCIUM  9.9 09/05/2014 1506   GFRNONAA >60 10/22/2024 0614   GFRNONAA >60 09/05/2014 1506   GFRAA >60 03/07/2016 1026   GFRAA >60 09/05/2014 1506   COAG Lab Results  Component Value Date   INR 1.1 06/23/2024     Disposition:  Discharge to :Home  Allergies as of 10/22/2024       Reactions   Codeine Nausea And Vomiting        Medication List     TAKE these medications    acetaminophen  325 MG tablet Commonly known as: TYLENOL  Take 650 mg by mouth every 8 (eight) hours as needed for moderate pain.   ALPRAZolam  0.25 MG tablet Commonly known as: XANAX  Take 0.25 mg by mouth at bedtime as needed for anxiety.   amLODipine  10 MG tablet Commonly known as: NORVASC  Take 10 mg by mouth daily.   aspirin  EC 81 MG tablet Take 81 mg by mouth daily.   clopidogrel  75 MG tablet Commonly known as: PLAVIX  75  mg daily.   cyanocobalamin  1000 MCG tablet Commonly known as: VITAMIN B12 Take 1,000 mcg by mouth.  Take 1,000 mcg by mouth once daily   diphenhydramine-acetaminophen  25-500 MG Tabs tablet Commonly known as: TYLENOL  PM Take 1 tablet by mouth at bedtime.   donepezil  10 MG tablet Commonly known as: ARICEPT  Take 10 mg by mouth.  TAKE 1 TABLET(10 MG) BY MOUTH AT BEDTIME   DULoxetine  60 MG capsule Commonly known as: CYMBALTA  Take 60 mg by mouth daily.   ferrous sulfate  325 (65 FE) MG tablet Take 325 mg by mouth.  Take 325 mg by mouth daily with breakfast   ibuprofen  400 MG tablet Commonly known as: ADVIL  Take 400 mg by mouth every 6 (six) hours as needed.   multivitamin with minerals Tabs tablet Take 1 tablet by mouth daily.   naphazoline-pheniramine 0.025-0.3 % ophthalmic solution Commonly known as: NAPHCON-A Place 1 drop into both eyes 4 (four) times daily as needed for eye irritation.   naproxen sodium 220 MG tablet Commonly known as: ALEVE Take 220 mg by mouth daily as needed.   QUEtiapine  25 MG tablet Commonly known as: SEROQUEL  Take  25 mg by mouth at bedtime.   rosuvastatin  20 MG tablet Commonly known as: CRESTOR  Take 20 mg by mouth daily.   trolamine salicylate 10 % cream Commonly known as: ASPERCREME Apply 1 application topically as needed for muscle pain.       Verbal and written Discharge instructions given to the patient. Wound care per Discharge AVS  Follow-up Information     Dew, Selinda RAMAN, MD Follow up in 3 week(s).   Specialties: Vascular Surgery, Radiology, Interventional Cardiology Why: [facility] Contact information: [address] [place] [address] [phone]                 Signed: Gwendlyn JONELLE Shank, NP  10/22/2024, 10:06 AM

## 2024-10-22 NOTE — Plan of Care (Signed)
  Problem: Education: Goal: Knowledge of General Education information will improve Description: Including pain rating scale, medication(s)/side effects and non-pharmacologic comfort measures Outcome: Not Progressing   Problem: Health Behavior/Discharge Planning: Goal: Ability to manage health-related needs will improve Outcome: Progressing   Problem: Clinical Measurements: Goal: Ability to maintain clinical measurements within normal limits will improve Outcome: Progressing Goal: Will remain free from infection Outcome: Progressing Goal: Cardiovascular complication will be avoided Outcome: Progressing   Problem: Activity: Goal: Risk for activity intolerance will decrease Outcome: Progressing   Problem: Nutrition: Goal: Adequate nutrition will be maintained Outcome: Progressing   Problem: Coping: Goal: Level of anxiety will decrease Outcome: Progressing   Problem: Elimination: Goal: Will not experience complications related to urinary retention Outcome: Progressing   Problem: Pain Managment: Goal: General experience of comfort will improve and/or be controlled Outcome: Progressing

## 2024-10-22 NOTE — Progress Notes (Addendum)
 Pt removed PIV while wearing safety mitts.  Pt confused and restless.  Pt cleaned, linens changed, and mitts reapplied.  Attempted to reorient pt, provide reassurance, and emotional support.  Medication given per pain score and previously received anti anxiety medication at 01:57.   Pt continues to pull at safety mitts and appears anxious.

## 2024-10-22 NOTE — Progress Notes (Signed)
 Responded to bed alarm. Pt was confused, restless and pulling at lines/tubes.  Attempted to reoriented pt, and placed safety mitts.  Provided reassurance and emotional support to patient.  He now appears to be resting comfortably with eyes closed.

## 2024-10-22 NOTE — Progress Notes (Incomplete)
  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 Placement of a 14 mm x 12 cm left iliac extension limb into the left external iliac artery for exclusion of the common iliac artery aneurysmal disease ProGlide closure devices bilateral femoral arteries   Vitals:   10/22/24 0600 10/22/24 0700  BP: (!) 149/82 (!) 145/87  Pulse: 93 91  Resp: 10 11  Temp:    SpO2: 92% 97%   Physical Exam: Cardiac:  *** Lungs:  *** Incisions:  *** Extremities:  *** Abdomen:  *** Neurologic: ***  CBC    Component Value Date/Time   WBC 10.1 10/22/2024 0614   RBC 3.14 (L) 10/22/2024 0614   HGB 9.4 (L) 10/22/2024 0614   HGB 12.6 (L) 09/05/2014 1506   HCT 29.8 (L) 10/22/2024 0614   HCT 39.0 (L) 09/05/2014 1506   PLT 219 10/22/2024 0614   PLT 236 09/05/2014 1506   MCV 94.9 10/22/2024 0614   MCV 93 09/05/2014 1506   MCH 29.9 10/22/2024 0614   MCHC 31.5 10/22/2024 0614   RDW 15.2 10/22/2024 0614   RDW 13.5 09/05/2014 1506   LYMPHSABS 2.1 06/23/2024 1545   LYMPHSABS 2.9 03/30/2013 0416   MONOABS 0.7 06/23/2024 1545   MONOABS 0.7 03/30/2013 0416   EOSABS 0.2 06/23/2024 1545   EOSABS 0.2 03/30/2013 0416   BASOSABS 0.0 06/23/2024 1545   BASOSABS 0.0 03/30/2013 0416    BMET    Component Value Date/Time   NA 141 10/22/2024 0614   NA 140 09/05/2014 1506   K 3.7 10/22/2024 0614   K 3.7 09/05/2014 1506   CL 106 10/22/2024 0614   CL 107  09/05/2014 1506   CO2 24 10/22/2024 0614   CO2 26 09/05/2014 1506   GLUCOSE 126 (H) 10/22/2024 0614   GLUCOSE 125 (H) 09/05/2014 1506   BUN 21 10/22/2024 0614   BUN 23 (H) 09/05/2014 1506   CREATININE 1.11 10/22/2024 0614   CREATININE 1.08 09/05/2014 1506   CALCIUM  8.3 (L) 10/22/2024 0614   CALCIUM  9.9 09/05/2014 1506   GFRNONAA >60 10/22/2024 0614   GFRNONAA >60 09/05/2014 1506   GFRAA >60 03/07/2016 1026   GFRAA >60 09/05/2014 1506    INR    Component Value Date/Time   INR 1.1 06/23/2024 1545     Intake/Output Summary (Last 24 hours) at 10/22/2024 0710 Last data filed at 10/22/2024 0700 Gross per 24 hour  Intake 1797.07 ml  Output 1150 ml  Net 647.07 ml     Assessment/Plan:  78 y.o. male is s/p *** 1 Day Post-Op  *** DVT prophylaxis:  ***   Tanica Gaige [facility] 10/22/2024 7:10 AM

## 2024-10-22 NOTE — Discharge Instructions (Signed)
 Vascular Surgery Discharge Instructions:  Do not lift anything heavy for the next 2 weeks do not lift anything more than a gallon.  Do not drive for the next 2 weeks.  Do not drive if you are taking any narcotic medication.  You may shower tomorrow on 10/23/2024.  Shower with the old dressings in place to your groins and remove immediately after showering.  Pat the area is completely dry and place a Band-Aid over both puncture sites for the next 3 days.  You are being discharged on aspirin  81 mg daily, Plavix  75 mg daily and Crestor  20 mg daily.  Do not miss or skip taking any of these medications as it will not alter the outcome of the procedure.  Follow-up with vein and vascular surgery as scheduled.

## 2024-10-24 ENCOUNTER — Inpatient Hospital Stay: Admitting: Radiology

## 2024-10-24 ENCOUNTER — Inpatient Hospital Stay
Admission: EM | Admit: 2024-10-24 | Discharge: 2024-11-10 | DRG: 356 | Disposition: A | Discharge status: Skilled Nursing Facility | Attending: Internal Medicine | Admitting: Internal Medicine

## 2024-10-24 ENCOUNTER — Observation Stay: Admitting: General Practice

## 2024-10-24 ENCOUNTER — Other Ambulatory Visit: Payer: Self-pay

## 2024-10-24 ENCOUNTER — Inpatient Hospital Stay

## 2024-10-24 ENCOUNTER — Encounter
Admission: EM | Disposition: A | Discharge status: Skilled Nursing Facility | Payer: Self-pay | Source: Home / Self Care | Attending: Internal Medicine

## 2024-10-24 ENCOUNTER — Emergency Department

## 2024-10-24 DIAGNOSIS — T45525A Adverse effect of antithrombotic drugs, initial encounter: Secondary | ICD-10-CM | POA: Diagnosis present

## 2024-10-24 DIAGNOSIS — I110 Hypertensive heart disease with heart failure: Secondary | ICD-10-CM | POA: Diagnosis present

## 2024-10-24 DIAGNOSIS — F05 Delirium due to known physiological condition: Secondary | ICD-10-CM | POA: Diagnosis present

## 2024-10-24 DIAGNOSIS — T39015A Adverse effect of aspirin, initial encounter: Secondary | ICD-10-CM | POA: Diagnosis present

## 2024-10-24 DIAGNOSIS — E871 Hypo-osmolality and hyponatremia: Secondary | ICD-10-CM | POA: Diagnosis present

## 2024-10-24 DIAGNOSIS — I5032 Chronic diastolic (congestive) heart failure: Secondary | ICD-10-CM | POA: Diagnosis present

## 2024-10-24 DIAGNOSIS — I5A Non-ischemic myocardial injury (non-traumatic): Secondary | ICD-10-CM | POA: Diagnosis present

## 2024-10-24 DIAGNOSIS — G309 Alzheimer's disease, unspecified: Secondary | ICD-10-CM | POA: Diagnosis present

## 2024-10-24 DIAGNOSIS — R578 Other shock: Secondary | ICD-10-CM | POA: Diagnosis present

## 2024-10-24 DIAGNOSIS — E878 Other disorders of electrolyte and fluid balance, not elsewhere classified: Secondary | ICD-10-CM | POA: Insufficient documentation

## 2024-10-24 DIAGNOSIS — E870 Hyperosmolality and hypernatremia: Secondary | ICD-10-CM | POA: Diagnosis present

## 2024-10-24 DIAGNOSIS — D62 Acute posthemorrhagic anemia: Secondary | ICD-10-CM | POA: Diagnosis present

## 2024-10-24 DIAGNOSIS — J690 Pneumonitis due to inhalation of food and vomit: Secondary | ICD-10-CM | POA: Diagnosis present

## 2024-10-24 DIAGNOSIS — J9601 Acute respiratory failure with hypoxia: Secondary | ICD-10-CM | POA: Diagnosis present

## 2024-10-24 DIAGNOSIS — Z7982 Long term (current) use of aspirin: Secondary | ICD-10-CM

## 2024-10-24 DIAGNOSIS — K219 Gastro-esophageal reflux disease without esophagitis: Secondary | ICD-10-CM | POA: Diagnosis present

## 2024-10-24 DIAGNOSIS — R7989 Other specified abnormal findings of blood chemistry: Secondary | ICD-10-CM | POA: Insufficient documentation

## 2024-10-24 DIAGNOSIS — K254 Chronic or unspecified gastric ulcer with hemorrhage: Secondary | ICD-10-CM

## 2024-10-24 DIAGNOSIS — F17210 Nicotine dependence, cigarettes, uncomplicated: Secondary | ICD-10-CM | POA: Diagnosis present

## 2024-10-24 DIAGNOSIS — R4020 Unspecified coma: Secondary | ICD-10-CM | POA: Diagnosis present

## 2024-10-24 DIAGNOSIS — Z885 Allergy status to narcotic agent status: Secondary | ICD-10-CM

## 2024-10-24 DIAGNOSIS — L89322 Pressure ulcer of left buttock, stage 2: Secondary | ICD-10-CM | POA: Diagnosis present

## 2024-10-24 DIAGNOSIS — G928 Other toxic encephalopathy: Secondary | ICD-10-CM | POA: Diagnosis present

## 2024-10-24 DIAGNOSIS — Z8673 Personal history of transient ischemic attack (TIA), and cerebral infarction without residual deficits: Secondary | ICD-10-CM | POA: Diagnosis not present

## 2024-10-24 DIAGNOSIS — Z7189 Other specified counseling: Secondary | ICD-10-CM | POA: Diagnosis not present

## 2024-10-24 DIAGNOSIS — L8990 Pressure ulcer of unspecified site, unspecified stage: Secondary | ICD-10-CM | POA: Diagnosis not present

## 2024-10-24 DIAGNOSIS — Z800 Family history of malignant neoplasm of digestive organs: Secondary | ICD-10-CM

## 2024-10-24 DIAGNOSIS — I714 Abdominal aortic aneurysm, without rupture, unspecified: Secondary | ICD-10-CM | POA: Diagnosis present

## 2024-10-24 DIAGNOSIS — N179 Acute kidney failure, unspecified: Secondary | ICD-10-CM | POA: Diagnosis present

## 2024-10-24 DIAGNOSIS — L89156 Pressure-induced deep tissue damage of sacral region: Secondary | ICD-10-CM | POA: Diagnosis present

## 2024-10-24 DIAGNOSIS — Z6824 Body mass index (BMI) 24.0-24.9, adult: Secondary | ICD-10-CM

## 2024-10-24 DIAGNOSIS — D6832 Hemorrhagic disorder due to extrinsic circulating anticoagulants: Secondary | ICD-10-CM | POA: Diagnosis present

## 2024-10-24 DIAGNOSIS — I7143 Infrarenal abdominal aortic aneurysm, without rupture: Secondary | ICD-10-CM | POA: Diagnosis not present

## 2024-10-24 DIAGNOSIS — G894 Chronic pain syndrome: Secondary | ICD-10-CM | POA: Diagnosis present

## 2024-10-24 DIAGNOSIS — K920 Hematemesis: Principal | ICD-10-CM | POA: Diagnosis present

## 2024-10-24 DIAGNOSIS — G259 Extrapyramidal and movement disorder, unspecified: Secondary | ICD-10-CM | POA: Diagnosis present

## 2024-10-24 DIAGNOSIS — I2489 Other forms of acute ischemic heart disease: Secondary | ICD-10-CM | POA: Diagnosis present

## 2024-10-24 DIAGNOSIS — L89152 Pressure ulcer of sacral region, stage 2: Secondary | ICD-10-CM | POA: Diagnosis present

## 2024-10-24 DIAGNOSIS — E7800 Pure hypercholesterolemia, unspecified: Secondary | ICD-10-CM | POA: Diagnosis present

## 2024-10-24 DIAGNOSIS — K922 Gastrointestinal hemorrhage, unspecified: Secondary | ICD-10-CM | POA: Diagnosis not present

## 2024-10-24 DIAGNOSIS — T39395A Adverse effect of other nonsteroidal anti-inflammatory drugs [NSAID], initial encounter: Secondary | ICD-10-CM | POA: Diagnosis present

## 2024-10-24 DIAGNOSIS — Z66 Do not resuscitate: Secondary | ICD-10-CM | POA: Diagnosis present

## 2024-10-24 DIAGNOSIS — K2270 Barrett's esophagus without dysplasia: Secondary | ICD-10-CM | POA: Diagnosis present

## 2024-10-24 DIAGNOSIS — Y832 Surgical operation with anastomosis, bypass or graft as the cause of abnormal reaction of the patient, or of later complication, without mention of misadventure at the time of the procedure: Secondary | ICD-10-CM | POA: Diagnosis present

## 2024-10-24 DIAGNOSIS — Z515 Encounter for palliative care: Secondary | ICD-10-CM | POA: Diagnosis not present

## 2024-10-24 DIAGNOSIS — F02C11 Dementia in other diseases classified elsewhere, severe, with agitation: Secondary | ICD-10-CM | POA: Diagnosis present

## 2024-10-24 DIAGNOSIS — Z7902 Long term (current) use of antithrombotics/antiplatelets: Secondary | ICD-10-CM

## 2024-10-24 DIAGNOSIS — E440 Moderate protein-calorie malnutrition: Secondary | ICD-10-CM | POA: Diagnosis present

## 2024-10-24 DIAGNOSIS — G929 Unspecified toxic encephalopathy: Secondary | ICD-10-CM | POA: Diagnosis not present

## 2024-10-24 DIAGNOSIS — I9789 Other postprocedural complications and disorders of the circulatory system, not elsewhere classified: Secondary | ICD-10-CM | POA: Diagnosis present

## 2024-10-24 DIAGNOSIS — R1310 Dysphagia, unspecified: Secondary | ICD-10-CM

## 2024-10-24 DIAGNOSIS — I2510 Atherosclerotic heart disease of native coronary artery without angina pectoris: Secondary | ICD-10-CM | POA: Diagnosis present

## 2024-10-24 DIAGNOSIS — E876 Hypokalemia: Secondary | ICD-10-CM | POA: Diagnosis present

## 2024-10-24 DIAGNOSIS — Z8261 Family history of arthritis: Secondary | ICD-10-CM

## 2024-10-24 DIAGNOSIS — E785 Hyperlipidemia, unspecified: Secondary | ICD-10-CM | POA: Diagnosis present

## 2024-10-24 DIAGNOSIS — K2289 Other specified disease of esophagus: Secondary | ICD-10-CM | POA: Diagnosis present

## 2024-10-24 DIAGNOSIS — Z781 Physical restraint status: Secondary | ICD-10-CM

## 2024-10-24 DIAGNOSIS — Z860100 Personal history of colon polyps, unspecified: Secondary | ICD-10-CM

## 2024-10-24 DIAGNOSIS — R338 Other retention of urine: Secondary | ICD-10-CM | POA: Diagnosis not present

## 2024-10-24 DIAGNOSIS — Z87442 Personal history of urinary calculi: Secondary | ICD-10-CM

## 2024-10-24 DIAGNOSIS — R7303 Prediabetes: Secondary | ICD-10-CM | POA: Diagnosis present

## 2024-10-24 DIAGNOSIS — G308 Other Alzheimer's disease: Secondary | ICD-10-CM | POA: Diagnosis not present

## 2024-10-24 DIAGNOSIS — M170 Bilateral primary osteoarthritis of knee: Secondary | ICD-10-CM | POA: Diagnosis present

## 2024-10-24 DIAGNOSIS — Z89421 Acquired absence of other right toe(s): Secondary | ICD-10-CM

## 2024-10-24 HISTORY — PX: ESOPHAGOGASTRODUODENOSCOPY: SHX5428

## 2024-10-24 HISTORY — PX: IR EMBO ART  VEN HEMORR LYMPH EXTRAV  INC GUIDE ROADMAPPING: IMG5450

## 2024-10-24 LAB — PREPARE RBC (CROSSMATCH)

## 2024-10-24 LAB — COMPREHENSIVE METABOLIC PANEL WITH GFR
ALT: 11 U/L (ref 0–44)
AST: 19 U/L (ref 15–41)
Albumin: 3 g/dL — ABNORMAL LOW (ref 3.5–5.0)
Alkaline Phosphatase: 61 U/L (ref 38–126)
Anion gap: 13 (ref 5–15)
BUN: 39 mg/dL — ABNORMAL HIGH (ref 8–23)
CO2: 21 mmol/L — ABNORMAL LOW (ref 22–32)
Calcium: 8.2 mg/dL — ABNORMAL LOW (ref 8.9–10.3)
Chloride: 106 mmol/L (ref 98–111)
Creatinine, Ser: 1.14 mg/dL (ref 0.61–1.24)
GFR, Estimated: >60 mL/min (ref >60)
Glucose, Bld: 143 mg/dL — ABNORMAL HIGH (ref 70–99)
Potassium: 3.5 mmol/L (ref 3.5–5.1)
Sodium: 140 mmol/L (ref 135–145)
Total Bilirubin: 0.7 mg/dL (ref 0.0–1.2)
Total Protein: 6.3 g/dL — ABNORMAL LOW (ref 6.5–8.1)

## 2024-10-24 LAB — CBC
HCT: 25 % — ABNORMAL LOW (ref 39.0–52.0)
HCT: 21.7 % — ABNORMAL LOW (ref 39.0–52.0)
HCT: 26 % — ABNORMAL LOW (ref 39.0–52.0)
Hemoglobin: 7.8 g/dL — ABNORMAL LOW (ref 13.0–17.0)
Hemoglobin: 6.9 g/dL — ABNORMAL LOW (ref 13.0–17.0)
Hemoglobin: 8.5 g/dL — ABNORMAL LOW (ref 13.0–17.0)
MCH: 29.5 pg (ref 26.0–34.0)
MCH: 29.4 pg (ref 26.0–34.0)
MCH: 29.7 pg (ref 26.0–34.0)
MCHC: 31.2 g/dL (ref 30.0–36.0)
MCHC: 31.8 g/dL (ref 30.0–36.0)
MCHC: 32.7 g/dL (ref 30.0–36.0)
MCV: 94.7 fL (ref 80.0–100.0)
MCV: 92.3 fL (ref 80.0–100.0)
MCV: 90.9 fL (ref 80.0–100.0)
Platelets: 215 K/uL (ref 150–400)
Platelets: 149 K/uL — ABNORMAL LOW (ref 150–400)
Platelets: 152 K/uL (ref 150–400)
RBC: 2.64 MIL/uL — ABNORMAL LOW (ref 4.22–5.81)
RBC: 2.35 MIL/uL — ABNORMAL LOW (ref 4.22–5.81)
RBC: 2.86 MIL/uL — ABNORMAL LOW (ref 4.22–5.81)
RDW: 15.1 % (ref 11.5–15.5)
RDW: 15.1 % (ref 11.5–15.5)
RDW: 15.8 % — ABNORMAL HIGH (ref 11.5–15.5)
WBC: 13 K/uL — ABNORMAL HIGH (ref 4.0–10.5)
WBC: 11 K/uL — ABNORMAL HIGH (ref 4.0–10.5)
WBC: 13.9 K/uL — ABNORMAL HIGH (ref 4.0–10.5)
nRBC: 0 % (ref 0.0–0.2)
nRBC: 0 % (ref 0.0–0.2)
nRBC: 0 % (ref 0.0–0.2)

## 2024-10-24 LAB — BLOOD GAS, ARTERIAL
Acid-base deficit: 4.1 mmol/L — ABNORMAL HIGH (ref 0.0–2.0)
Bicarbonate: 21.6 mmol/L (ref 20.0–28.0)
FIO2: 0.4 %
MECHVT: 500 mL
O2 Saturation: 89.5 %
PEEP: 5 cmH2O
Patient temperature: 37
RATE: 16 {breaths}/min
pCO2 arterial: 41 mmHg (ref 32–48)
pH, Arterial: 7.33 — ABNORMAL LOW (ref 7.35–7.45)
pO2, Arterial: 57 mmHg — ABNORMAL LOW (ref 83–108)

## 2024-10-24 LAB — MRSA NEXT GEN BY PCR, NASAL: MRSA by PCR Next Gen: NOT DETECTED

## 2024-10-24 LAB — PROTIME-INR
INR: 1.2 (ref 0.8–1.2)
Prothrombin Time: 15.6 s — ABNORMAL HIGH (ref 11.4–15.2)

## 2024-10-24 LAB — TROPONIN I (HIGH SENSITIVITY): Troponin I (High Sensitivity): 33 ng/L — ABNORMAL HIGH (ref <18)

## 2024-10-24 LAB — GLUCOSE, CAPILLARY: Glucose-Capillary: 120 mg/dL — ABNORMAL HIGH (ref 70–99)

## 2024-10-24 LAB — APTT: aPTT: 33 s (ref 24–36)

## 2024-10-24 MED ORDER — HYDROMORPHONE HCL 1 MG/ML IJ SOLN
0.5000 mg | INTRAMUSCULAR | Status: DC | PRN
  Administered 2024-10-25 – 2024-10-26 (×2): 1 mg via INTRAVENOUS
  Filled 2024-10-24 (×2): qty 1

## 2024-10-24 MED ORDER — FENTANYL CITRATE (PF) 50 MCG/ML IJ SOSY: 50.0000 ug | PREFILLED_SYRINGE | Freq: Once | INTRAMUSCULAR | Status: DC

## 2024-10-24 MED ORDER — PANTOPRAZOLE SODIUM 40 MG IV SOLR: 40.0000 mg | Freq: Two times a day (BID) | INTRAVENOUS | Status: DC

## 2024-10-24 MED ORDER — ONDANSETRON HCL 4 MG/2ML IJ SOLN
4.0000 mg | Freq: Four times a day (QID) | INTRAMUSCULAR | Status: DC | PRN
  Administered 2024-10-24: 4 mg via INTRAVENOUS

## 2024-10-24 MED ORDER — ACETAMINOPHEN 325 MG PO TABS
650.0000 mg | ORAL_TABLET | Freq: Four times a day (QID) | ORAL | Status: DC | PRN
  Administered 2024-10-30: 650 mg via ORAL
  Filled 2024-10-24: qty 2

## 2024-10-24 MED ORDER — LIDOCAINE HCL (PF) 2 % IJ SOLN
INTRAMUSCULAR | Status: AC
  Filled 2024-10-24: qty 5

## 2024-10-24 MED ORDER — METOCLOPRAMIDE HCL 5 MG/ML IJ SOLN
10.0000 mg | Freq: Four times a day (QID) | INTRAMUSCULAR | Status: AC
  Administered 2024-10-24 – 2024-10-25 (×3): 10 mg via INTRAVENOUS
  Filled 2024-10-24 (×3): qty 2

## 2024-10-24 MED ORDER — CHLORHEXIDINE GLUCONATE CLOTH 2 % EX PADS
6.0000 | MEDICATED_PAD | Freq: Every day | CUTANEOUS | Status: DC
  Administered 2024-10-24 – 2024-11-09 (×17): 6 via TOPICAL

## 2024-10-24 MED ORDER — IOHEXOL 350 MG/ML SOLN
100.0000 mL | Freq: Once | INTRAVENOUS | Status: AC | PRN
  Administered 2024-10-24: 100 mL via INTRAVENOUS

## 2024-10-24 MED ORDER — LIDOCAINE HCL 1 % IJ SOLN
INTRAMUSCULAR | Status: AC
  Filled 2024-10-24: qty 20

## 2024-10-24 MED ORDER — PANTOPRAZOLE SODIUM 40 MG IV SOLR
40.0000 mg | Freq: Two times a day (BID) | INTRAVENOUS | Status: DC
  Administered 2024-10-24 – 2024-11-07 (×29): 40 mg via INTRAVENOUS
  Filled 2024-10-24 (×29): qty 10

## 2024-10-24 MED ORDER — ONDANSETRON HCL 4 MG PO TABS: 4.0000 mg | ORAL_TABLET | Freq: Four times a day (QID) | ORAL | Status: DC | PRN

## 2024-10-24 MED ORDER — SODIUM CHLORIDE 0.9 % IV SOLN: INTRAVENOUS | Status: DC | PRN

## 2024-10-24 MED ORDER — LIDOCAINE HCL (CARDIAC) PF 100 MG/5ML IV SOSY
PREFILLED_SYRINGE | INTRAVENOUS | Status: DC | PRN
  Administered 2024-10-24: 100 mg via INTRAVENOUS

## 2024-10-24 MED ORDER — POTASSIUM CHLORIDE 10 MEQ/100ML IV SOLN
10.0000 meq | INTRAVENOUS | Status: AC
  Administered 2024-10-24 (×2): 10 meq via INTRAVENOUS
  Filled 2024-10-24 (×2): qty 100

## 2024-10-24 MED ORDER — SODIUM CHLORIDE 0.9% IV SOLUTION: Freq: Once | INTRAVENOUS | Status: AC

## 2024-10-24 MED ORDER — PHENYLEPHRINE 80 MCG/ML (10ML) SYRINGE FOR IV PUSH (FOR BLOOD PRESSURE SUPPORT)
PREFILLED_SYRINGE | INTRAVENOUS | Status: DC | PRN
  Administered 2024-10-24 (×2): 160 ug via INTRAVENOUS

## 2024-10-24 MED ORDER — PHENYLEPHRINE 80 MCG/ML (10ML) SYRINGE FOR IV PUSH (FOR BLOOD PRESSURE SUPPORT)
PREFILLED_SYRINGE | INTRAVENOUS | Status: AC
Start: 2024-10-24 — End: 2024-10-24
  Filled 2024-10-24: qty 10

## 2024-10-24 MED ORDER — EPHEDRINE 5 MG/ML INJ
INTRAVENOUS | Status: AC
Start: 2024-10-24 — End: 2024-10-24
  Filled 2024-10-24: qty 5

## 2024-10-24 MED ORDER — MIDAZOLAM HCL 2 MG/2ML IJ SOLN
INTRAMUSCULAR | Status: AC
  Filled 2024-10-24: qty 4

## 2024-10-24 MED ORDER — ORAL CARE MOUTH RINSE
15.0000 mL | OROMUCOSAL | Status: DC
  Administered 2024-10-24 – 2024-10-25 (×9): 15 mL via OROMUCOSAL

## 2024-10-24 MED ORDER — DEXMEDETOMIDINE HCL IN NACL 80 MCG/20ML IV SOLN
INTRAVENOUS | Status: AC
  Filled 2024-10-24: qty 20

## 2024-10-24 MED ORDER — PROPOFOL 1000 MG/100ML IV EMUL
INTRAVENOUS | Status: AC
  Filled 2024-10-24: qty 100

## 2024-10-24 MED ORDER — MIDAZOLAM HCL (PF) 2 MG/2ML IJ SOLN
2.0000 mg | INTRAMUSCULAR | Status: DC | PRN
  Administered 2024-10-24 – 2024-10-26 (×12): 2 mg via INTRAVENOUS
  Filled 2024-10-24 (×13): qty 2

## 2024-10-24 MED ORDER — PROPOFOL 10 MG/ML IV BOLUS
INTRAVENOUS | Status: DC | PRN
  Administered 2024-10-24: 50 mg via INTRAVENOUS
  Administered 2024-10-24: 25 ug/kg/min via INTRAVENOUS
  Administered 2024-10-24: 150 mg via INTRAVENOUS

## 2024-10-24 MED ORDER — FENTANYL 2500MCG IN NS 250ML (10MCG/ML) PREMIX INFUSION
0.0000 ug/h | INTRAVENOUS | Status: DC
  Administered 2024-10-24 – 2024-10-25 (×2): 125 ug/h via INTRAVENOUS
  Administered 2024-10-26: 200 ug/h via INTRAVENOUS
  Administered 2024-10-26: 125 ug/h via INTRAVENOUS
  Filled 2024-10-24 (×5): qty 250

## 2024-10-24 MED ORDER — MIDAZOLAM HCL (PF) 2 MG/2ML IJ SOLN: 4.0000 mg | Freq: Once | INTRAMUSCULAR | Status: AC

## 2024-10-24 MED ORDER — POLYETHYLENE GLYCOL 3350 17 G PO PACK
17.0000 g | PACK | Freq: Every day | ORAL | Status: DC | PRN
Start: 2024-10-24 — End: 2024-11-04

## 2024-10-24 MED ORDER — LIDOCAINE HCL 1 % IJ SOLN
10.0000 mL | Freq: Once | INTRAMUSCULAR | Status: AC
  Administered 2024-10-24: 5 mL via INTRADERMAL

## 2024-10-24 MED ORDER — FENTANYL CITRATE (PF) 100 MCG/2ML IJ SOLN
INTRAMUSCULAR | Status: DC | PRN
  Administered 2024-10-24: 50 ug via INTRAVENOUS

## 2024-10-24 MED ORDER — DEXMEDETOMIDINE HCL IN NACL 200 MCG/50ML IV SOLN
0.0000 ug/kg/h | INTRAVENOUS | Status: DC
  Administered 2024-10-25: 0.4 ug/kg/h via INTRAVENOUS
  Administered 2024-10-25 – 2024-10-26 (×2): 0.7 ug/kg/h via INTRAVENOUS
  Filled 2024-10-24 (×3): qty 50

## 2024-10-24 MED ORDER — ACETAMINOPHEN 650 MG RE SUPP
650.0000 mg | Freq: Four times a day (QID) | RECTAL | Status: DC | PRN
Start: 2024-10-24 — End: 2024-10-30

## 2024-10-24 MED ORDER — MIDAZOLAM HCL 2 MG/2ML IJ SOLN
INTRAMUSCULAR | Status: AC
  Administered 2024-10-24: 4 mg via INTRAVENOUS
  Filled 2024-10-24: qty 4

## 2024-10-24 MED ORDER — OXYCODONE HCL 5 MG PO TABS: 5.0000 mg | ORAL_TABLET | ORAL | Status: DC | PRN

## 2024-10-24 MED ORDER — ORAL CARE MOUTH RINSE: 15.0000 mL | OROMUCOSAL | Status: DC | PRN

## 2024-10-24 MED ORDER — KETAMINE HCL-SODIUM CHLORIDE 1000-0.69 MG/100ML-% IV SOLN
0.5000 mg/kg/h | INTRAVENOUS | Status: DC
  Administered 2024-10-24: 0.5 mg/kg/h via INTRAVENOUS
  Filled 2024-10-24: qty 100

## 2024-10-24 MED ORDER — PROPOFOL 10 MG/ML IV BOLUS
INTRAVENOUS | Status: AC
  Filled 2024-10-24: qty 20

## 2024-10-24 MED ORDER — SUCCINYLCHOLINE CHLORIDE 200 MG/10ML IV SOSY
PREFILLED_SYRINGE | INTRAVENOUS | Status: AC
  Filled 2024-10-24: qty 10

## 2024-10-24 MED ORDER — LACTATED RINGERS IV BOLUS
250.0000 mL | Freq: Once | INTRAVENOUS | Status: AC
  Administered 2024-10-24: 250 mL via INTRAVENOUS

## 2024-10-24 MED ORDER — DEXAMETHASONE SOD PHOSPHATE PF 10 MG/ML IJ SOLN
INTRAMUSCULAR | Status: DC | PRN
  Administered 2024-10-24: 5 mg via INTRAVENOUS

## 2024-10-24 MED ORDER — IOHEXOL 300 MG/ML  SOLN
150.0000 mL | Freq: Once | INTRAMUSCULAR | Status: AC | PRN
  Administered 2024-10-24: 100 mL

## 2024-10-24 MED ORDER — FENTANYL CITRATE (PF) 100 MCG/2ML IJ SOLN
INTRAMUSCULAR | Status: AC
  Filled 2024-10-24: qty 2

## 2024-10-24 MED ORDER — METOCLOPRAMIDE HCL 5 MG/ML IJ SOLN: 10.0000 mg | Freq: Once | INTRAMUSCULAR | Status: DC

## 2024-10-24 MED ORDER — PHENYLEPHRINE HCL-NACL 20-0.9 MG/250ML-% IV SOLN
INTRAVENOUS | Status: AC
  Filled 2024-10-24: qty 250

## 2024-10-24 MED ORDER — INFLUENZA VAC SPLIT HIGH-DOSE 0.5 ML IM SUSY
0.5000 mL | PREFILLED_SYRINGE | INTRAMUSCULAR | Status: DC | PRN
Start: 2024-10-24 — End: 2024-10-27

## 2024-10-24 MED ORDER — SODIUM CHLORIDE 0.9 % IV SOLN: Freq: Once | INTRAVENOUS | Status: AC

## 2024-10-24 MED ORDER — ROCURONIUM BROMIDE 100 MG/10ML IV SOLN
INTRAVENOUS | Status: DC | PRN
  Administered 2024-10-24: 30 mg via INTRAVENOUS
  Administered 2024-10-24: 50 mg via INTRAVENOUS

## 2024-10-24 MED ORDER — FENTANYL CITRATE (PF) 50 MCG/ML IJ SOSY
50.0000 ug | PREFILLED_SYRINGE | Freq: Once | INTRAMUSCULAR | Status: AC
Start: 2024-10-24 — End: 2024-10-24
  Administered 2024-10-24: 50 ug via INTRAVENOUS

## 2024-10-24 MED ORDER — PANTOPRAZOLE SODIUM 40 MG IV SOLR
40.0000 mg | Freq: Once | INTRAVENOUS | Status: AC
  Administered 2024-10-24: 40 mg via INTRAVENOUS
  Filled 2024-10-24: qty 10

## 2024-10-24 MED ORDER — ONDANSETRON HCL 4 MG/2ML IJ SOLN
4.0000 mg | Freq: Once | INTRAMUSCULAR | Status: AC
  Administered 2024-10-24: 4 mg via INTRAVENOUS
  Filled 2024-10-24: qty 2

## 2024-10-24 MED ORDER — FENTANYL CITRATE (PF) 100 MCG/2ML IJ SOLN
INTRAMUSCULAR | Status: AC
  Filled 2024-10-24: qty 4

## 2024-10-24 MED ORDER — LACTATED RINGERS IV SOLN: INTRAVENOUS | Status: AC

## 2024-10-24 MED ORDER — SUCCINYLCHOLINE CHLORIDE 200 MG/10ML IV SOSY
PREFILLED_SYRINGE | INTRAVENOUS | Status: DC | PRN
  Administered 2024-10-24: 100 mg via INTRAVENOUS

## 2024-10-24 MED ORDER — SODIUM CHLORIDE 0.9 % IV SOLN
10.0000 mL/h | Freq: Once | INTRAVENOUS | Status: AC
  Administered 2024-10-24: 10 mL/h via INTRAVENOUS

## 2024-10-24 MED ORDER — PHENYLEPHRINE HCL-NACL 20-0.9 MG/250ML-% IV SOLN
INTRAVENOUS | Status: DC | PRN
  Administered 2024-10-24: 20 ug/min via INTRAVENOUS

## 2024-10-24 MED ORDER — DEXMEDETOMIDINE HCL IN NACL 80 MCG/20ML IV SOLN
INTRAVENOUS | Status: DC | PRN
Start: 2024-10-24 — End: 2024-10-24
  Administered 2024-10-24 (×3): 8 ug via INTRAVENOUS

## 2024-10-24 NOTE — Anesthesia Postprocedure Evaluation (Signed)
 Anesthesia Post Note  Patient: Albert [name]  Procedure(s) Performed: EGD (ESOPHAGOGASTRODUODENOSCOPY)  Patient location during evaluation: PACU Anesthesia Type: General Level of consciousness: awake and alert Pain management: pain level controlled Vital Signs Assessment: post-procedure vital signs reviewed and stable Respiratory status: spontaneous breathing, nonlabored ventilation, respiratory function stable and patient connected to nasal cannula oxygen Cardiovascular status: blood pressure returned to baseline and stable Postop Assessment: no apparent nausea or vomiting Anesthetic complications: no   No notable events documented.   Last Vitals:  Vitals:   10/24/24 1620 10/24/24 1625  BP: 120/77 130/78  Pulse: 83 83  Resp: 14 14  Temp:    SpO2: 100% 100%    Last Pain:  Vitals:   10/24/24 1355  TempSrc: (P) Axillary  PainSc:                  Debby Mines

## 2024-10-24 NOTE — Interval H&P Note (Signed)
 History and Physical Interval Note: Consult note from 10/24/24 was reviewed and there was no interval change after seeing and examining the patient.  Written consent was obtained from the patient's wife after discussion of risks, benefits, and alternatives. Patient's wife has consented for patient to proceed with Esophagogastroduodenoscopy with possible intervention   10/24/2024 10:00 AM  Albert [name]  has presented today for surgery, with the diagnosis of hematemesis.  The various methods of treatment have been discussed with the patient and family. After consideration of risks, benefits and other options for treatment, the patient has consented to  Procedure(s): EGD (ESOPHAGOGASTRODUODENOSCOPY) (N/A) as a surgical intervention.  The patient's history has been reviewed, patient examined, no change in status, stable for surgery.  I have reviewed the patient's chart and labs.  Questions were answered to the patient's satisfaction.     Elspeth Ozell Jungling

## 2024-10-24 NOTE — Progress Notes (Signed)
 PHARMACY CONSULT NOTE - FOLLOW UP  Pharmacy Consult for Electrolyte Monitoring and Replacement   Recent Labs: Potassium (mmol/L)  Date Value  10/24/2024 3.5  09/05/2014 3.7   Calcium  (mg/dL)  Date Value  88/91/7974 8.2 (L)   Calcium , Total (mg/dL)  Date Value  90/79/7984 9.9   Albumin (g/dL)  Date Value  88/91/7974 3.0 (L)  09/05/2014 3.5   Sodium (mmol/L)  Date Value  10/24/2024 140  09/05/2014 140     Assessment: 78 y.o. male with medical history significant for chronic dementia, Barrett's esophagus, chronic pain, CVA, AAA s/p recent endograft 10/21/2024 who presented to ED with hematemesis. Pharmacy is asked to follow and replace electrolytes while in CCU  Goal of Therapy:  Electrolytes WNL  Plan:  ---10 mEq IV KCl x 2 ---recheck electrolytes in am  Adriana JONETTA Bolster ,PharmD Clinical Pharmacist 10/24/2024 12:09 PM

## 2024-10-24 NOTE — ED Notes (Signed)
 Pt to OR now

## 2024-10-24 NOTE — Progress Notes (Addendum)
      Daily Progress Note  Case discussed w/ ED MD.  Pt with syncope and hematemesis.  Doubt directly related to EVAR, as aortoenteric fistula don't form by POD #3.  Blood loss is possible especially with complications related to access closure.  None is documented in his Op Note.  Regardless, H/H is relatively stable.  ED w/u in progress including CTA.  Will review once CTA availabe.    Redell Door, MD, FACS, FSVS Covering for [facility]: [phone]  10/24/2024, 8:01 AM  Addendum I reviewed CTA abd/pelvis: - no evidence of active extravasation into any part of GI tract c/w fistula - patent endograft with expected L IIA embolization coils - Type II endoleak probably from a patent IMA  - Changes in aortic sac size are not expected for 6-12 months - Type 2 endoleaks are NOT aortic sac leaks.  In this this case, it's a retrograde filling IMA.  Most will thrombose with time - delayed contrast in stomach unrelated to EVAR - Pt should have follow up already schedule with Dr. Dreama or Marea.  Redell Door, MD, FACS, FSVS Covering for [facility]: [phone]  10/24/2024, 9:07 AM   Addendum Vitals:   10/24/24 0800 10/24/24 0845 10/24/24 0911 10/24/24 0911  BP: (!) 149/72 (!) 144/78 (!) 159/77 (!) 156/77  Pulse: (!) 111 99 (!) 102   Resp: (!) 22 19 18    Temp:  98.3 F (36.8 C)  98 F (36.7 C)  TempSrc:  Oral Oral   SpO2:  100% 98%   Weight:      Height:       ABD: soft, NTND, -G/R, no palpable AAA pulse, both groins echymotic without significant hematoma  - Discussed findings with Family - GI already came by and planning on EGD - Will let Dr. Dreama and Dr. Marea know pt admitted

## 2024-10-24 NOTE — Procedures (Signed)
 Interventional Radiology Procedure Note  Procedure: Angiogram via right femoral access - coil embolization of a segment of the gastroepiploic artery along the greater curvature at the expected site of bleeding.    Complications: Iatrogenic dissection/spasm of proximal left gastric artery which should further decrease bloodflow temporarily.  This will heal on its own and requires no follow-up or treatment  Estimated Blood Loss: None  Recommendations: - Trend H&H and transfuse as needed - Repeat EGD if persistent blood loss   Signed,  Wilkie LOIS Lent, MD

## 2024-10-24 NOTE — H&P (Addendum)
 History and Physical    Albert [name] FMW:[id number] DOB: [birth year] DOA: 10/24/2024  DOS: the patient was seen and examined on 10/24/2024  PCP: Lenon [name] ORN, MD   Patient coming from: Home  I have personally briefly reviewed patient's old medical records in [facility]  Chief Complaint: Vomiting blood  HPI: Albert [name] is a pleasant 78 y.o. male with medical history significant for chronic dementia, Barrett's esophagus, chronic pain, history of stroke, abdominal aortic aneurysm s/p recent endograft 10/21/2024 who presented to ED with hematemesis started this morning.  Patient is not able to provide meaningful history.  Patient's wife was at bedside and she stated that patient had syncopal episode and feeling weak this morning.  He also had dark blood vomiting.  Patient was taking NSAIDs and antiplatelets.  CTA performed with active extravasation of gastric lumen.  Hemoglobin prior to discharge was 10.9 and today it was 7.8.  Patient was given 1 unit of PRBC.  Patient was tachycardic.  Patient was evaluated by vascular team and they advised that for vascular check there was no concern from vascular standpoint.  They advised for GI evaluation.  ED Course: Upon arrival to the ED, patient is found to be tachycardic, weak.  Vascular surgery evaluated patient and advised for GI evaluation.  GI evaluated the patient and plan for urgent endoscopy.  Patient was taken to GI lab for further evaluation.  Review of Systems:  ROS  All other systems negative except as noted in the HPI.  Past Medical History:  Diagnosis Date   Anemia    Anxiety    a.) on BZO (alproazolam) PRN   Arthritis of both knees    Back pain    Barrett esophagus    Chronic back pain    Dementia (HCC)    arising in the senium and presenium   Displaced fracture of proximal phalanx of right lesser toe(s), initial encounter for closed fracture    Diverticulosis    GERD (gastroesophageal reflux disease)     OTC meds as needed   Headache    History of colon polyps    benign    History of kidney stones    History of migraine    Hyperlipidemia    Hypertension    Insomnia    Joint pain    Nephrolithiasis    Pre-diabetes    Stroke [facility])    without residual deficits    Past Surgical History:  Procedure Laterality Date   AMPUTATION TOE Right 04/13/2022   Procedure: 28820 - AMPUTATION TOE;  Surgeon: Ashley Soulier, DPM;  Location: [facility] ORS;  Service: Podiatry;  Laterality: Right;   ANKLE ARTHROSCOPY Right 04/13/2022   Procedure: A-SCOPE/OCD REPAIR;  Surgeon: Ashley Soulier, DPM;  Location: [facility] ORS;  Service: Podiatry;  Laterality: Right;   ANKLE SURGERY Right 1997   ankles/screws    ARTHROSCOPIC REPAIR ACL Right 1993   BACK SURGERY  2017   titanium rods and pins in lower back   COLONOSCOPY     ENDOVASCULAR STENT GRAFT (AAA) N/A 10/21/2024   Procedure: ENDOVASCULAR STENT GRAFT (AAA);  Surgeon: Marea Selinda RAMAN, MD;  Location: [facility] INVASIVE CV LAB;  Service: Cardiovascular;  Laterality: N/A;   EPIDIDYMECTOMY Left 04/10/2017   Procedure: EPIDIDYMECTOMY;  Surgeon: Rosina Riis, MD;  Location: [facility] ORS;  Service: Urology;  Laterality: Left;   ESOPHAGOGASTRODUODENOSCOPY     LUMBAR FUSION  March 13, 2016   L4/L5 fusion   MINOR HARDWARE REMOVAL Right 04/13/2022  Procedure: REMOVAL SCREW; DEEP;  Surgeon: Ashley Soulier, DPM;  Location: [facility] ORS;  Service: Podiatry;  Laterality: Right;   TONSILLECTOMY  1962   adenoidectomy    VASECTOMY  1973   WOUND DEBRIDEMENT Right 04/13/2022   Procedure: 29906 - SUBTALAR ARTHROSCOPY;  Surgeon: Ashley Soulier, DPM;  Location: [facility] ORS;  Service: Podiatry;  Laterality: Right;     reports that he has been smoking cigarettes. He has a 25 pack-year smoking history. He has never used smokeless tobacco. He reports that he does not drink alcohol and does not use drugs.  Allergies  Allergen Reactions   Codeine Nausea And Vomiting    Family History  Problem  Relation Age of Onset   Arthritis Mother    Esophageal cancer Father    Prostate cancer Neg Hx    Bladder Cancer Neg Hx    Kidney cancer Neg Hx     Prior to Admission medications   Medication Sig Start Date End Date Taking? Authorizing Provider  acetaminophen  (TYLENOL ) 325 MG tablet Take 650 mg by mouth every 8 (eight) hours as needed for moderate pain.    [provider]  ALPRAZolam  (XANAX ) 0.25 MG tablet Take 0.25 mg by mouth at bedtime as needed for anxiety. Patient not taking: Reported on 10/22/2024 05/01/19   [provider]  amLODipine  (NORVASC ) 10 MG tablet Take 10 mg by mouth daily. 08/27/18   [provider]  aspirin  EC 81 MG tablet Take 81 mg by mouth daily.    [provider]  clopidogrel  (PLAVIX ) 75 MG tablet 75 mg daily. 07/29/19   [provider]  cyanocobalamin  (VITAMIN B12) 1000 MCG tablet Take 1,000 mcg by mouth.  Take 1,000 mcg by mouth once daily    [provider]  diphenhydramine-acetaminophen  (TYLENOL  PM) 25-500 MG TABS tablet Take 1 tablet by mouth at bedtime.    [provider]  donepezil  (ARICEPT ) 10 MG tablet Take 10 mg by mouth.  TAKE 1 TABLET(10 MG) BY MOUTH AT BEDTIME 04/23/24   [provider]  DULoxetine  (CYMBALTA ) 60 MG capsule Take 60 mg by mouth daily. 02/25/19 10/21/24  [provider]  ferrous sulfate  325 (65 FE) MG tablet Take 325 mg by mouth.  Take 325 mg by mouth daily with breakfast    [provider]  ibuprofen  (ADVIL ) 400 MG tablet Take 400 mg by mouth every 6 (six) hours as needed.    [provider]  Multiple Vitamin (MULTIVITAMIN WITH MINERALS) TABS tablet Take 1 tablet by mouth daily.    [provider]  naphazoline-pheniramine (NAPHCON-A) 0.025-0.3 % ophthalmic solution Place 1 drop into both eyes 4 (four) times daily as needed for eye irritation.    [provider]  naproxen sodium (ALEVE) 220 MG tablet Take 220 mg by mouth daily as  needed.    [provider]  QUEtiapine  (SEROQUEL ) 25 MG tablet Take 25 mg by mouth at bedtime. 06/15/24   [provider]  rosuvastatin  (CRESTOR ) 20 MG tablet Take 20 mg by mouth daily. 07/29/19   [provider]  trolamine salicylate (ASPERCREME) 10 % cream Apply 1 application topically as needed for muscle pain.    [provider]    Physical Exam: Vitals:   10/24/24 0845 10/24/24 0906 10/24/24 1000 10/24/24 1030  BP: (!) 144/78 125/86 131/77 130/78  Pulse: 99 (!) 102 (!) 102 (!) 101  Resp: 19 18 18 20   Temp: 98.3 F (36.8 C)  98.3 F (36.8 C)   TempSrc:  Oral Oral Temporal   SpO2: 100% 98% 97% 98%  Weight:      Height:        Physical Exam   Constitutional: Alert, awake, pleasantly demented HEENT: Neck supple Respiratory: Clear to auscultation B/L, no wheezing, no rales.  Cardiovascular: Regular rate and rhythm, no murmurs / rubs / gallops. No extremity edema. 2+ pedal pulses. No carotid bruits.  Abdomen: Soft, no tenderness, Bowel sounds positive.  Musculoskeletal: no clubbing / cyanosis. Good ROM, no contractures. Normal muscle tone.  Skin: no rashes, lesions, ulcers. Neurologic: CN 2-12 grossly intact. Sensation intact, No focal deficit identified Psychiatric: Alert and oriented x 3. Normal mood.    Labs on Admission: I have personally reviewed following labs and imaging studies  CBC: Recent Labs  Lab 10/20/24 0920 10/22/24 0614 10/24/24 0731  WBC 7.8 10.1 13.0*  HGB 10.9* 9.4* 7.8*  HCT 33.6* 29.8* 25.0*  MCV 91.8 94.9 94.7  PLT 294 219 215   Basic Metabolic Panel: Recent Labs  Lab 10/20/24 0920 10/22/24 0614 10/24/24 0731  NA 144 141 140  K 4.0 3.7 3.5  CL 105 106 106  CO2 27 24 21*  GLUCOSE 100* 126* 143*  BUN 27* 21 39*  CREATININE 1.02 1.11 1.14  CALCIUM  9.1 8.3* 8.2*   GFR: Estimated Creatinine Clearance: 61.3 mL/min (by C-G formula based on SCr of 1.14 mg/dL). Liver Function Tests: Recent Labs  Lab  10/24/24 0731  AST 19  ALT 11  ALKPHOS 61  BILITOT 0.7  PROT 6.3*  ALBUMIN 3.0*   No results for input(s): LIPASE, AMYLASE in the last 168 hours. No results for input(s): AMMONIA in the last 168 hours. Coagulation Profile: Recent Labs  Lab 10/24/24 0731  INR 1.2   Cardiac Enzymes: Recent Labs  Lab 10/24/24 0731  TROPONINIHS 33*   BNP (last 3 results) No results for input(s): BNP in the last 8760 hours. HbA1C: No results for input(s): HGBA1C in the last 72 hours. CBG: Recent Labs  Lab 10/21/24 1614 10/21/24 1914  GLUCAP 124* 112*   Lipid Profile: No results for input(s): CHOL, HDL, LDLCALC, TRIG, CHOLHDL, LDLDIRECT in the last 72 hours. Thyroid  Function Tests: No results for input(s): TSH, T4TOTAL, FREET4, T3FREE, THYROIDAB in the last 72 hours. Anemia Panel: No results for input(s): VITAMINB12, FOLATE, FERRITIN, TIBC, IRON, RETICCTPCT in the last 72 hours. Urine analysis:    Component Value Date/Time   COLORURINE YELLOW (A) 06/22/2024 1725   APPEARANCEUR CLEAR (A) 06/22/2024 1725   APPEARANCEUR Hazy 09/05/2014 1506   LABSPEC 1.025 06/22/2024 1725   LABSPEC 1.030 09/05/2014 1506   PHURINE 5.0 06/22/2024 1725   GLUCOSEU NEGATIVE 06/22/2024 1725   GLUCOSEU Negative 09/05/2014 1506   HGBUR NEGATIVE 06/22/2024 1725   BILIRUBINUR NEGATIVE 06/22/2024 1725   BILIRUBINUR Negative 09/05/2014 1506   KETONESUR NEGATIVE 06/22/2024 1725   PROTEINUR NEGATIVE 06/22/2024 1725   NITRITE NEGATIVE 06/22/2024 1725   LEUKOCYTESUR NEGATIVE 06/22/2024 1725   LEUKOCYTESUR Negative 09/05/2014 1506    Radiological Exams on Admission: I have personally reviewed images CT Angio Abd/Pel W and/or Wo Contrast Result Date: 10/24/2024 EXAM: CTA ABDOMEN AND PELVIS WITHOUT AND WITH CONTRAST 10/24/2024 07:57:02 AM TECHNIQUE: CTA images of the abdomen and pelvis without and with intravenous contrast. Three-dimensional MIP/volume rendered formations  were performed. Automated exposure control, iterative reconstruction, and/or weight based adjustment of the mA/kV was utilized to reduce the radiation dose to as low as reasonably achievable. COMPARISON: CTA of the abdomen and pelvis 09/01/2024. CLINICAL HISTORY:  78 year old male with a history of endograft repair of abdominal aortic aneurysm. FINDINGS: VASCULATURE: AORTA: Bifurcated abdominal aortic endograft is patent. Evidence of endoleak at the proximal bifurcated portion of the device (series 6 images 108 through 121 and delayed series 13 image 45). The underlying native aneurysm sac has not significantly diminished since September and some images suggest it may be 1 or 2 mm larger (series 13 image 49 approximately 52 to 53 mm versus 51 mm at the same level previously; coronal image 27 of series 15 suggesting 53 mm; sagittal image 109 of series 10 suggesting 51 mm). Underlying advanced aortic atherosclerosis and chronic infrarenal abdominal aortic aneurysm which measured up to 5.3 cm diameter in September. Superimposed coil embolization of the left internal iliac artery. Advanced calcified iliac atherosclerosis bilaterally. Proximal femoral artery atherosclerosis. The major arterial structures in the abdomen and pelvis remain patent. On the delayed images the portal venous system appears patent. . . No early arterial contrast extravasation into the bowel is identified.however, on only the delayed images there does appear to be a small volume of puddling blood or contrast within the stomach along the greater curve series 13 image 26. LIVER: The liver is unremarkable. GALLBLADDER AND BILE DUCTS: Gallbladder is unremarkable. No biliary ductal dilatation. Vicarious contrast excretion to the gallbladder. SPLEEN: The spleen is unremarkable. PANCREAS: The pancreas is unremarkable. ADRENAL GLANDS: Chronically abnormal left adrenal gland has both low density and calcified components stable since September, and  reportedly no new findings have been obtained so far from a 2016 CT suggesting benign etiology (no follow up imaging recommended). Right adrenal gland demonstrates no acute abnormality. KIDNEYS, URETERS AND BLADDER: Stable polycystic renal disease which appears benign (no follow up imaging recommended). On the delayed images early renal contrast excretion appears symmetric. No stones in the kidneys or ureters. No hydronephrosis. No perinephric or periureteral stranding. Small volume gas within the urinary bladder, suspicious for gas forming infection unless explained by recent catheterization (series 6 image 199). GI AND BOWEL: Stomach and duodenal sweep demonstrate no acute abnormality. Severe diverticulosis of the sigmoid colon in the left lower quadrant and pelvis. No active inflammation. Moderate diverticulosis of the descending colon. Nondilated bowel. Normal appendix on coronal image (series 6 image 173). No free air or free fluid identified. REPRODUCTIVE: Reproductive organs are unremarkable. PERITONEUM AND RETROPERITONEUM: No ascites or free air. No evidence of retroperitoneal hemorrhage. LUNG BASE: Severe irregularity and atherosclerosis of the visible descending thoracic aorta. Multifocal irregular mural plaque or thrombus. Caliber of the vessel is stable. Underlying calcified atherosclerosis. Normal heart size. No pericardial or pleural effusion. Mild lung base atelectasis or scarring. LYMPH NODES: No lymphadenopathy. BONES AND SOFT TISSUES: Chronic severe degeneration in the spine superimposed on previous lower lumbar decompression and fusion. Insert bones. No acute soft tissue abnormality. IMPRESSION: 1. CTA suggests slow but active bleeding into the gastric lumen along the great curve. See series 13 image 26. 2. Positive also for Endoleak at or near Endograft graft bifurcation; underlying abdominal aortic aneurysm with native aneurysm sac not significantly diminished since 09/01/2024 (up to 53 mm). 3.  Underlying Severe thoracic and abdominal aortic atherosclerosis. major arterial structures remain patent. 4. Small volume intravesical gas, suspicious for gas-forming urinary infection if no recent catheterization. Electronically signed by: Helayne Hurst MD 10/24/2024 08:34 AM EST RP Workstation: [id number]    EKG: My personal interpretation of EKG shows: Sinus tachycardia at 113 bpm    Assessment/Plan Principal Problem:   GI bleeding Active Problems:   GERD (  gastroesophageal reflux disease)   H/O stroke without residual deficits   Hypercholesterolemia   AAA (abdominal aortic aneurysm) without rupture    Assessment and Plan: 78 year old male with history of dementia, GERD, stroke, HTN, HLD, recent aortic aneurysm repair who came into the hospital with hematemesis and syncopal episode.  1.  Upper GI bleeding - He will be admitted to hospital for acute GI bleeding. - Will hold off aspirin , Plavix  and other anticoagulation - Monitor CBC every 6 hours - He was started on Protonix, IV fluid, n.p.o. - He was given 1 unit of PRBC in the emergency room. - GI evaluated patient and taken him to the GI lab. - GI notified that patient has a large clot in his stomach and endoscopy was not able to be amenable and requesting IR evaluation.  Critical care has been consulted as patient may remain to be intubated for some time. - ICU team accepted the patient  2.  Recent aortic aneurysm repair - Appreciated vascular team evaluation and advised no further intervention from vascular team at this point - Continue to monitor symptoms  3.  Dementia - Supportive care    DVT prophylaxis: SCDs Code Status: Full Code Family Communication: Wife at bedside Disposition Plan: To be determined Consults called: GI/vascular/ICU Admission status: Observation, Step Down Unit   Nena Rebel, MD [facility] 10/24/2024, 11:42 AM

## 2024-10-24 NOTE — Consult Note (Addendum)
 CRITICAL CARE     Name: Albert [name] MRN: [id number] DOB: 02-Jul-1946     LOS: 0   SUBJECTIVE FINDINGS & SIGNIFICANT EVENTS    History of Presenting Illness:  This is a 78 year old male with a history of dementia, Barrett's esophagus chronic pain syndrome, history of CVA, abdominal aortic aneurysm status post endograft few days ago on October 21, 2024 who came in today with hematemesis.  Patient's wife reported syncopal episode and feelings of malaise subsequently started having melanotic stools and emesis with blood.  Patient was apparently on antiplatelet therapy as well as NSAIDs at home.  He did have CTA performed which showed active extravasation of gastric lumen.  His blood work showed a decrement in hemoglobin from 10.9-7.8. He did have order for 1 unit blood transfusion which is in process of being infused at this moment.  Vascular surgery evaluated patient and did not feel that bleeding was related to prior surgery.  GI evaluation was performed and gastroenterologist attempted EGD with findings of large clotted blood in stomach.  He was unable to finish procedure and requested patient to be transferred to intensive care unit.  Further interventional radiology was contacted for possible embolization and will attempt to evaluate patient today.  Patient received IV Protonix.  Patient is on resuscitative IV fluids.  He is not currently on vasopressor support but he did receive 1 dose of Neo-Synephrine earlier today due to transient hypotension.  ICU admission is requested due to upper GI bleeding.  Lines/tubes : Airway 7.5 mm (Active)    Microbiology/Sepsis markers: Results for orders placed or performed during the hospital encounter of 10/21/24  MRSA Next Gen by PCR, Nasal     Status: None   Collection Time:  10/21/24  4:26 PM   Specimen: Nasal Mucosa; Nasal Swab  Result Value Ref Range Status   MRSA by PCR Next Gen NOT DETECTED NOT DETECTED Final    Comment: (NOTE) The GeneXpert MRSA Assay (FDA approved for NASAL specimens only), is one component of a comprehensive MRSA colonization surveillance program. It is not intended to diagnose MRSA infection nor to guide or monitor treatment for MRSA infections. Test performance is not FDA approved in patients less than 64 years old. Performed at [facility], [address], [place], [place] [address]     Anti-infectives:  Anti-infectives (From admission, onward)    None        Consults:  GASTROENTEROLOGY VASCULAR SURGERY  CRITICAL CARE  INTERVENTIONAL RADIOLOGY   PAST MEDICAL HISTORY   Past Medical History:  Diagnosis Date   Anemia    Anxiety    a.) on BZO (alproazolam) PRN   Arthritis of both knees    Back pain    Barrett esophagus    Chronic back pain    Dementia (HCC)    arising in the senium and presenium   Displaced fracture of proximal phalanx of right lesser toe(s), initial encounter for closed fracture    Diverticulosis    GERD (gastroesophageal reflux disease)    OTC meds as needed   Headache    History of colon polyps    benign    History of kidney stones    History of migraine    Hyperlipidemia    Hypertension    Insomnia    Joint pain    Nephrolithiasis    Pre-diabetes    Stroke [facility])    without residual deficits     SURGICAL HISTORY   Past Surgical  History:  Procedure Laterality Date   AMPUTATION TOE Right 04/13/2022   Procedure: 28820 - AMPUTATION TOE;  Surgeon: Ashley Soulier, DPM;  Location: [facility] ORS;  Service: Podiatry;  Laterality: Right;   ANKLE ARTHROSCOPY Right 04/13/2022   Procedure: A-SCOPE/OCD REPAIR;  Surgeon: Ashley Soulier, DPM;  Location: [facility] ORS;  Service: Podiatry;  Laterality: Right;   ANKLE SURGERY Right 1997   ankles/screws    ARTHROSCOPIC REPAIR ACL Right 1993    BACK SURGERY  2017   titanium rods and pins in lower back   COLONOSCOPY     ENDOVASCULAR STENT GRAFT (AAA) N/A 10/21/2024   Procedure: ENDOVASCULAR STENT GRAFT (AAA);  Surgeon: Marea Selinda RAMAN, MD;  Location: [facility] INVASIVE CV LAB;  Service: Cardiovascular;  Laterality: N/A;   EPIDIDYMECTOMY Left 04/10/2017   Procedure: EPIDIDYMECTOMY;  Surgeon: Rosina Riis, MD;  Location: [facility] ORS;  Service: Urology;  Laterality: Left;   ESOPHAGOGASTRODUODENOSCOPY     LUMBAR FUSION  March 13, 2016   L4/L5 fusion   MINOR HARDWARE REMOVAL Right 04/13/2022   Procedure: REMOVAL SCREW; DEEP;  Surgeon: Ashley Soulier, DPM;  Location: [facility] ORS;  Service: Podiatry;  Laterality: Right;   TONSILLECTOMY  1962   adenoidectomy    VASECTOMY  1973   WOUND DEBRIDEMENT Right 04/13/2022   Procedure: 29906 - SUBTALAR ARTHROSCOPY;  Surgeon: Ashley Soulier, DPM;  Location: [facility] ORS;  Service: Podiatry;  Laterality: Right;     FAMILY HISTORY   Family History  Problem Relation Age of Onset   Arthritis Mother    Esophageal cancer Father    Prostate cancer Neg Hx    Bladder Cancer Neg Hx    Kidney cancer Neg Hx      SOCIAL HISTORY   Social History   Tobacco Use   Smoking status: Every Day    Current packs/day: 0.50    Average packs/day: 0.5 packs/day for 50.0 years (25.0 ttl pk-yrs)    Types: Cigarettes   Smokeless tobacco: Never   Tobacco comments:    Half a pack a day, per wife.  Vaping Use   Vaping status: Never Used  Substance Use Topics   Alcohol use: No   Drug use: No     MEDICATIONS   Current Medication:  Current Facility-Administered Medications:    acetaminophen  (TYLENOL ) tablet 650 mg, 650 mg, Oral, Q6H PRN **OR** acetaminophen  (TYLENOL ) suppository 650 mg, 650 mg, Rectal, Q6H PRN, Paudel, Keshab, MD   Chlorhexidine  Gluconate Cloth 2 % PADS 6 each, 6 each, Topical, Daily, Luiscarlos Kaczmarczyk, MD   HYDROmorphone  (DILAUDID ) injection 0.5-1 mg, 0.5-1 mg, Intravenous, Q2H PRN, Paudel, Nena, MD    lactated ringers  infusion, , Intravenous, Continuous, Teona Vargus, MD   ILDA Hold] metoCLOPramide (REGLAN) injection 10 mg, 10 mg, Intravenous, Once, Onita Elspeth Sharper, DO   ondansetron  (ZOFRAN ) tablet 4 mg, 4 mg, Oral, Q6H PRN **OR** ondansetron  (ZOFRAN ) injection 4 mg, 4 mg, Intravenous, Q6H PRN, Paudel, Keshab, MD, 4 mg at 10/24/24 1105   Oral care mouth rinse, 15 mL, Mouth Rinse, Q2H, Bertice Risse, MD   Oral care mouth rinse, 15 mL, Mouth Rinse, PRN, Emmerson Taddei, MD   oxyCODONE  (Oxy IR/ROXICODONE ) immediate release tablet 5 mg, 5 mg, Oral, Q4H PRN, Paudel, Keshab, MD   pantoprazole (PROTONIX) injection 40 mg, 40 mg, Intravenous, Q12H, Gean Laursen, MD   polyethylene glycol (MIRALAX / GLYCOLAX) packet 17 g, 17 g, Oral, Daily PRN, Paudel, Keshab, MD  Facility-Administered Medications Ordered in Other Encounters:    0.9 %  sodium chloride   infusion, , Intravenous, Continuous PRN, Gillermo Spruce I, CRNA, [place] at 10/24/24 1152   dexamethasone  (DECADRON ) injection, , Intravenous, Anesthesia Intra-op, Gillermo Spruce I, CRNA, 5 mg at 10/24/24 1105   fentaNYL  (SUBLIMAZE ) injection, , Intravenous, Anesthesia Intra-op, Gillermo Spruce I, CRNA, 50 mcg at 10/24/24 1101   lidocaine  (cardiac) 100 mg/40mL (XYLOCAINE ) injection 2%, , Intravenous, Anesthesia Intra-op, Gillermo Spruce I, CRNA, 100 mg at 10/24/24 1101   phenylephrine  (NEO-SYNEPHRINE) 20mg /NS 250mL premix infusion, , Intravenous, Continuous PRN, Gillermo, Jeannette I, CRNA, Last Rate: 7.5 mL/hr at 10/24/24 1145, 10 mcg/min at 10/24/24 1145   PHENYLephrine  80 mcg/ml in normal saline Adult IV Push Syringe (For Blood Pressure Support), , Intravenous, Anesthesia Intra-op, Gillermo Spruce I, CRNA, 160 mcg at 10/24/24 1119   propofol  (DIPRIVAN ) 10 mg/mL bolus/IV push, , Intravenous, Anesthesia Intra-op, Gillermo Spruce I, CRNA, 45 mcg/kg/min at 10/24/24 1146   rocuronium  (ZEMURON ) injection, , Intravenous, Anesthesia  Intra-op, Gillermo Spruce I, CRNA, 50 mg at 10/24/24 1140   succinylcholine (ANECTINE) syringe, , Intravenous, Anesthesia Intra-op, Gillermo Spruce I, CRNA, 100 mg at 10/24/24 1101    ALLERGIES   Codeine    REVIEW OF SYSTEMS     Unable to obtain review of systems due to unresponsive state with endotracheal intubation on ventilator.  PHYSICAL EXAMINATION   Vital Signs: Temp:  [98.1 F (36.7 C)-98.3 F (36.8 C)] 98.3 F (36.8 C) (11/08 1000) Pulse Rate:  [99-118] 101 (11/08 1030) Resp:  [18-22] 20 (11/08 1030) BP: (125-149)/(72-86) 130/78 (11/08 1030) SpO2:  [97 %-100 %] 98 % (11/08 1030) Weight:  [90 kg] 90 kg (11/08 0726)  GENERAL: Age-appropriate sedated on ventilator HEAD: Normocephalic, atraumatic.  EYES: Pupils equal, round, reactive to light.  No scleral icterus.  MOUTH: Moist mucosal membrane. NECK: Supple. No thyromegaly. No nodules. No JVD.  PULMONARY: Mechanical ventilation breath sounds bilaterally CARDIOVASCULAR: S1 and S2. Regular rate and rhythm. No murmurs, rubs, or gallops.  GASTROINTESTINAL: Soft, nontender, non-distended. No masses. Positive bowel sounds. No hepatosplenomegaly.  MUSCULOSKELETAL: No swelling, clubbing, or edema.  NEUROLOGIC: GCS 4 T SKIN:intact,warm,dry   PERTINENT DATA     Infusions:  lactated ringers      Scheduled Medications:  Chlorhexidine  Gluconate Cloth  6 each Topical Daily   [MAR Hold] metoCLOPramide (REGLAN) injection  10 mg Intravenous Once   mouth rinse  15 mL Mouth Rinse Q2H   pantoprazole (PROTONIX) IV  40 mg Intravenous Q12H   PRN Medications: acetaminophen  **OR** acetaminophen , HYDROmorphone  (DILAUDID ) injection, ondansetron  **OR** ondansetron  (ZOFRAN ) IV, mouth rinse, oxyCODONE , polyethylene glycol Hemodynamic parameters:   Intake/Output: No intake/output data recorded.  Ventilator  Settings:     LAB RESULTS:  Basic Metabolic Panel: Recent Labs  Lab 10/20/24 0920 10/22/24 0614 10/24/24 0731   NA 144 141 140  K 4.0 3.7 3.5  CL 105 106 106  CO2 27 24 21*  GLUCOSE 100* 126* 143*  BUN 27* 21 39*  CREATININE 1.02 1.11 1.14  CALCIUM  9.1 8.3* 8.2*   Liver Function Tests: Recent Labs  Lab 10/24/24 0731  AST 19  ALT 11  ALKPHOS 61  BILITOT 0.7  PROT 6.3*  ALBUMIN 3.0*   No results for input(s): LIPASE, AMYLASE in the last 168 hours. No results for input(s): AMMONIA in the last 168 hours. CBC: Recent Labs  Lab 10/20/24 0920 10/22/24 0614 10/24/24 0731  WBC 7.8 10.1 13.0*  HGB 10.9* 9.4* 7.8*  HCT 33.6* 29.8* 25.0*  MCV 91.8 94.9 94.7  PLT 294 219 215   Cardiac Enzymes:  No results for input(s): CKTOTAL, CKMB, CKMBINDEX, TROPONINI in the last 168 hours. BNP: Invalid input(s): POCBNP CBG: Recent Labs  Lab 10/21/24 1614 10/21/24 1914  GLUCAP 124* 112*       IMAGING RESULTS:     ASSESSMENT AND PLAN    -Multidisciplinary rounds held today  Acute upper GI bleed -Prepare blood transfusion - Appropriate central venous access - Resuscitative IV fluids - H&H monitoring every 4 hours x 24 hours - Gastroenterology and interventional radiology has been consulted and are on case there is plan for possible embolization of bleeding vessel via IR service.  GI was unable to finish EGD. -reviewed case with GI Dr Onita  -s/p CTA -  CTA suggests slow but active bleeding into the gastric lumen along the great   Acute blood loss anemia   As above due to GI bleeding   - Prbc transfusion    - IV fluids    - moniotor H/h   History of CVA and dementia - Avoid hypotension ICU telemetry monitoring - DC antiplatelet and DC NSAIDs at this time   Status post abdominal aortic aneurysm repair - Repeat imaging when able-vascular surgery on case -s/p CTA -  Positive also for Endoleak at or near Endograft graft bifurcation; underlying abdominal aortic aneurysm with native aneurysm sac not significantly   GI/Nutrition GI PROPHYLAXIS as  indicated DIET-->TF's as tolerated Constipation protocol as indicated   ENDO - ICU hypoglycemic\Hyperglycemia protocol -check FSBS per protocol   ELECTROLYTES -follow labs as needed -replace as needed -pharmacy consultation   DVT/GI PRX ordered -SCDs  TRANSFUSIONS AS NEEDED MONITOR FSBS ASSESS the need for LABS as needed    Critical care provider statement:   Total critical care time: 63 minutes   Performed by: Parris MD   Critical care time was exclusive of separately billable procedures and treating other patients.   Critical care was necessary to treat or prevent imminent or life-threatening deterioration.   Critical care was time spent personally by me on the following activities: development of treatment plan with patient and/or surrogate as well as nursing, discussions with consultants, evaluation of patient's response to treatment, examination of patient, obtaining history from patient or surrogate, ordering and performing treatments and interventions, ordering and review of laboratory studies, ordering and review of radiographic studies, pulse oximetry and re-evaluation of patient's condition.    Luken Shadowens, M.D.  Pulmonary & Critical Care Medicine

## 2024-10-24 NOTE — Anesthesia Preprocedure Evaluation (Signed)
 Anesthesia Evaluation  Patient identified by MRN, date of birth, ID band Patient awake and Patient confused    Reviewed: Allergy & Precautions, NPO status , Patient's Chart, lab work & pertinent test results  History of Anesthesia Complications Negative for: history of anesthetic complications  Airway Mallampati: III  TM Distance: <3 FB Neck ROM: full    Dental  (+) Chipped, Poor Dentition, Missing   Pulmonary neg shortness of breath, Current Smoker   Pulmonary exam normal        Cardiovascular hypertension, (-) angina (-) Past MI negative cardio ROS Normal cardiovascular exam     Neuro/Psych  Headaches      Dementia CVA    GI/Hepatic Neg liver ROS,GERD  Controlled,,  Endo/Other  negative endocrine ROS    Renal/GU      Musculoskeletal   Abdominal   Peds  Hematology negative hematology ROS (+)   Anesthesia Other Findings Patient with a PMH of dementia and vomiting blood. Discussed the case with the patient's wife at bedside. Patient vomited blood 4 hours ago. Discussed with the wife that the safest way to proceed would be with an ETT. Wife agrees and understands the increased risk of damage to the patients lungs, heart, and brain while under anesthesia.    Past Medical History: No date: Anemia No date: Anxiety     Comment:  a.) on BZO (alproazolam) PRN No date: Arthritis of both knees No date: Back pain No date: Barrett esophagus No date: Chronic back pain No date: Dementia (HCC)     Comment:  arising in the senium and presenium No date: Displaced fracture of proximal phalanx of right lesser  toe(s), initial encounter for closed fracture No date: Diverticulosis No date: GERD (gastroesophageal reflux disease)     Comment:  OTC meds as needed No date: Headache No date: History of colon polyps     Comment:  benign  No date: History of kidney stones No date: History of migraine No date: Hyperlipidemia No  date: Hypertension No date: Insomnia No date: Joint pain No date: Nephrolithiasis No date: Pre-diabetes No date: Stroke [facility])     Comment:  without residual deficits  Past Surgical History: 04/13/2022: AMPUTATION TOE; Right     Comment:  Procedure: 28820 - AMPUTATION TOE;  Surgeon: Ashley Soulier, DPM;  Location: [facility] ORS;  Service: Podiatry;                Laterality: Right; 04/13/2022: ANKLE ARTHROSCOPY; Right     Comment:  Procedure: A-SCOPE/OCD REPAIR;  Surgeon: Ashley Soulier,              DPM;  Location: [facility] ORS;  Service: Podiatry;                Laterality: Right; 1997: ANKLE SURGERY; Right     Comment:  ankles/screws  1993: ARTHROSCOPIC REPAIR ACL; Right 2017: BACK SURGERY     Comment:  titanium rods and pins in lower back No date: COLONOSCOPY 04/10/2017: EPIDIDYMECTOMY; Left     Comment:  Procedure: EPIDIDYMECTOMY;  Surgeon: Rosina Riis, MD;              Location: [facility] ORS;  Service: Urology;  Laterality: Left; No date: ESOPHAGOGASTRODUODENOSCOPY March 13, 2016: LUMBAR FUSION     Comment:  L4/L5 fusion 04/13/2022: MINOR HARDWARE REMOVAL; Right     Comment:  Procedure: REMOVAL SCREW; DEEP;  Surgeon: Ashley,  Eva, DPM;  Location: [facility] ORS;  Service: Podiatry;                Laterality: Right; 1962: TONSILLECTOMY     Comment:  adenoidectomy  1973: VASECTOMY 04/13/2022: WOUND DEBRIDEMENT; Right     Comment:  Procedure: 29906 - SUBTALAR ARTHROSCOPY;  Surgeon:               Ashley Eva, DPM;  Location: [facility] ORS;  Service:               Podiatry;  Laterality: Right;     Reproductive/Obstetrics negative OB ROS                              Anesthesia Physical Anesthesia Plan  ASA: 3 and emergent  Anesthesia Plan: General ETT   Post-op Pain Management:    Induction: Intravenous  PONV Risk Score and Plan: 2 and Ondansetron  and Dexamethasone   Airway Management Planned: Oral ETT  Additional Equipment:    Intra-op Plan:   Post-operative Plan: Extubation in OR  Informed Consent: I have reviewed the patients History and Physical, chart, labs and discussed the procedure including the risks, benefits and alternatives for the proposed anesthesia with the patient or authorized representative who has indicated his/her understanding and acceptance.     Dental Advisory Given  Plan Discussed with: Anesthesiologist, CRNA and Surgeon  Anesthesia Plan Comments: (Patient consented for risks of anesthesia including but not limited to:  - adverse reactions to medications - damage to eyes, teeth, lips or other oral mucosa - nerve damage due to positioning  - sore throat or hoarseness - Damage to heart, brain, nerves, lungs, other parts of body or loss of life  Patient voiced understanding and assent.)        Anesthesia Quick Evaluation

## 2024-10-24 NOTE — Transfer of Care (Signed)
 Immediate Anesthesia Transfer of Care Note  Patient: Albert [name]  Procedure(s) Performed: EGD (ESOPHAGOGASTRODUODENOSCOPY)  Patient Location: PACU and ICU  Anesthesia Type:General  Level of Consciousness: sedated  Airway & Oxygen Therapy: Patient remains intubated per anesthesia plan and Patient placed on Ventilator (see vital sign flow sheet for setting)  Post-op Assessment: Report given to RN and Post -op Vital signs reviewed and stable  Post vital signs: stable  Last Vitals:  Vitals Value Taken Time  BP    Temp    Pulse    Resp    SpO2      Last Pain:  Vitals:   10/24/24 1030  TempSrc:   PainSc: 0-No pain         Complications: No notable events documented.

## 2024-10-24 NOTE — Consult Note (Addendum)
 Chief Complaint:  Active Bleeding into Gastric Lumen  Procedure: Angiogram with possible Embolization  Referring Provider(s): Dr. Elspeth Jungling  Supervising Physician: Karalee Beat  Patient Status: [facility] - In-pt  History of Present Illness: Albert [name] is a 78 y.o. male with a history of dementia, CVA, HTN, AAA s/p endovascular stent grafting of AAA on 11/5. He presented to the ED on 11/8 after a reported fall and episode of hematemesis. In ED, Hgb was noted to have dropped to 7.8 from 9.4 two days prior. He was given 1 unit of PRBC. CTA suggestive of slow but active bleeding into the gastric lumen along the great curve and endoleak at or near the endograft bifurcation with underlying abdominal aortic aneurysm with native aneurysm sac not significantly diminished since 09/01/24. Subsequently underwent EGD in which several clots were noted to be in the gastric lumen. Unfortunately, the source of bleeding was unable to be visualized and the procedure was aborted and patient was returned to the ICU. IR consulted for possible embolization. Case and imaging reviewed and approved by Dr. VEAR Karalee.  Patient resting in bed; unresponsive on exam due to medication and intubation. Consent obtained from his wife over the phone. NPO since midnight. Currently receiving an additional unit of blood. All questions and concerns answered over the phone.   Patient is Full Code  Past Medical History:  Diagnosis Date   Anemia    Anxiety    a.) on BZO (alproazolam) PRN   Arthritis of both knees    Back pain    Barrett esophagus    Chronic back pain    Dementia (HCC)    arising in the senium and presenium   Displaced fracture of proximal phalanx of right lesser toe(s), initial encounter for closed fracture    Diverticulosis    GERD (gastroesophageal reflux disease)    OTC meds as needed   Headache    History of colon polyps    benign    History of kidney stones    History of migraine     Hyperlipidemia    Hypertension    Insomnia    Joint pain    Nephrolithiasis    Pre-diabetes    Stroke [facility])    without residual deficits    Past Surgical History:  Procedure Laterality Date   AMPUTATION TOE Right 04/13/2022   Procedure: 28820 - AMPUTATION TOE;  Surgeon: Ashley Soulier, DPM;  Location: [facility] ORS;  Service: Podiatry;  Laterality: Right;   ANKLE ARTHROSCOPY Right 04/13/2022   Procedure: A-SCOPE/OCD REPAIR;  Surgeon: Ashley Soulier, DPM;  Location: [facility] ORS;  Service: Podiatry;  Laterality: Right;   ANKLE SURGERY Right 1997   ankles/screws    ARTHROSCOPIC REPAIR ACL Right 1993   BACK SURGERY  2017   titanium rods and pins in lower back   COLONOSCOPY     ENDOVASCULAR STENT GRAFT (AAA) N/A 10/21/2024   Procedure: ENDOVASCULAR STENT GRAFT (AAA);  Surgeon: Marea Selinda RAMAN, MD;  Location: [facility] INVASIVE CV LAB;  Service: Cardiovascular;  Laterality: N/A;   EPIDIDYMECTOMY Left 04/10/2017   Procedure: EPIDIDYMECTOMY;  Surgeon: Rosina Riis, MD;  Location: [facility] ORS;  Service: Urology;  Laterality: Left;   ESOPHAGOGASTRODUODENOSCOPY     LUMBAR FUSION  March 13, 2016   L4/L5 fusion   MINOR HARDWARE REMOVAL Right 04/13/2022   Procedure: REMOVAL SCREW; DEEP;  Surgeon: Ashley Soulier, DPM;  Location: [facility] ORS;  Service: Podiatry;  Laterality: Right;   TONSILLECTOMY  1962  adenoidectomy    VASECTOMY  1973   WOUND DEBRIDEMENT Right 04/13/2022   Procedure: 29906 - SUBTALAR ARTHROSCOPY;  Surgeon: Ashley Soulier, DPM;  Location: [facility] ORS;  Service: Podiatry;  Laterality: Right;    Allergies: Codeine  Medications: Prior to Admission medications   Medication Sig Start Date End Date Taking? Authorizing Provider  acetaminophen  (TYLENOL ) 325 MG tablet Take 650 mg by mouth every 8 (eight) hours as needed for moderate pain.    [provider]  ALPRAZolam  (XANAX ) 0.25 MG tablet Take 0.25 mg by mouth at bedtime as needed for anxiety. Patient not taking: Reported on 10/22/2024  05/01/19   [provider]  amLODipine  (NORVASC ) 10 MG tablet Take 10 mg by mouth daily. 08/27/18   [provider]  aspirin  EC 81 MG tablet Take 81 mg by mouth daily.    [provider]  clopidogrel  (PLAVIX ) 75 MG tablet 75 mg daily. 07/29/19   [provider]  cyanocobalamin  (VITAMIN B12) 1000 MCG tablet Take 1,000 mcg by mouth.  Take 1,000 mcg by mouth once daily    [provider]  diphenhydramine-acetaminophen  (TYLENOL  PM) 25-500 MG TABS tablet Take 1 tablet by mouth at bedtime.    [provider]  donepezil  (ARICEPT ) 10 MG tablet Take 10 mg by mouth.  TAKE 1 TABLET(10 MG) BY MOUTH AT BEDTIME 04/23/24   [provider]  DULoxetine  (CYMBALTA ) 60 MG capsule Take 60 mg by mouth daily. 02/25/19 10/21/24  [provider]  ferrous sulfate  325 (65 FE) MG tablet Take 325 mg by mouth.  Take 325 mg by mouth daily with breakfast    [provider]  ibuprofen  (ADVIL ) 400 MG tablet Take 400 mg by mouth every 6 (six) hours as needed.    [provider]  Multiple Vitamin (MULTIVITAMIN WITH MINERALS) TABS tablet Take 1 tablet by mouth daily.    [provider]  naphazoline-pheniramine (NAPHCON-A) 0.025-0.3 % ophthalmic solution Place 1 drop into both eyes 4 (four) times daily as needed for eye irritation.    [provider]  naproxen sodium (ALEVE) 220 MG tablet Take 220 mg by mouth daily as needed.    [provider]  QUEtiapine  (SEROQUEL ) 25 MG tablet Take 25 mg by mouth at bedtime. 06/15/24   [provider]  rosuvastatin  (CRESTOR ) 20 MG tablet Take 20 mg by mouth daily. 07/29/19   [provider]  trolamine salicylate (ASPERCREME) 10 % cream Apply 1 application topically as needed for muscle pain.    [provider]     Family History  Problem Relation Age of Onset   Arthritis Mother    Esophageal cancer Father    Prostate cancer Neg Hx    Bladder Cancer Neg Hx     Kidney cancer Neg Hx     Social History   Socioeconomic History   Marital status: Married    Spouse name: [name]   Number of children: Not on file   Years of education: Not on file   Highest education level: Not on file  Occupational History   Not on file  Tobacco Use   Smoking status: Every Day    Current packs/day: 0.50    Average packs/day: 0.5 packs/day for 50.0 years (25.0 ttl pk-yrs)    Types: Cigarettes   Smokeless tobacco: Never   Tobacco comments:    Half a pack a day, per wife.  Vaping Use   Vaping status: Never Used  Substance and Sexual Activity   Alcohol  use: No   Drug use: No   Sexual activity: Not Currently  Other Topics Concern   Not on file  Social History Narrative   Not on file   Social Drivers of Health   Financial Resource Strain: Low Risk  (08/31/2024)   Received from [facility]   Overall Financial Resource Strain (CARDIA)    Difficulty of Paying Living Expenses: Not very hard  Food Insecurity: Patient Unable To Answer (10/21/2024)   Hunger Vital Sign    Worried About Running Out of Food in the Last Year: Patient unable to answer    Ran Out of Food in the Last Year: Patient unable to answer  Transportation Needs: Patient Unable To Answer (10/21/2024)   PRAPARE - Transportation    Lack of Transportation (Medical): Patient unable to answer    Lack of Transportation (Non-Medical): Patient unable to answer  Physical Activity: Not on file  Stress: Not on file  Social Connections: Unknown (10/21/2024)   Social Connection and Isolation Panel    Frequency of Communication with Friends and Family: Patient unable to answer    Frequency of Social Gatherings with Friends and Family: Patient unable to answer    Attends Religious Services: Patient unable to answer    Active Member of Clubs or Organizations: Patient unable to answer    Attends Banker Meetings: Patient unable to answer    Marital Status: Married      Review of Systems  Reason unable to perform ROS: unable to particpate in exam to intubation/light sedation.   Vital Signs: BP (!) 96/56   Pulse (!) 110   Temp 98.9 F (37.2 C) (Axillary)   Resp 20   Ht 6' 1 (1.854 m)   Wt 198 lb 6.6 oz (90 kg)   SpO2 100%   BMI 26.18 kg/m    Physical Exam Vitals reviewed.  Constitutional:      Appearance: Normal appearance.  HENT:     Mouth/Throat:     Mouth: Mucous membranes are moist.     Pharynx: Oropharynx is clear.     Comments: ET tube in place Cardiovascular:     Rate and Rhythm: Normal rate and regular rhythm.     Heart sounds: Normal heart sounds.     Comments: Bilateral groin punctures with overlying bandages; right groin noted to have surrounding bruising. No apparent swelling or tenderness  to palpation Pulmonary:     Effort: Pulmonary effort is normal.     Breath sounds: Normal breath sounds.  Abdominal:     General: Abdomen is flat.     Palpations: Abdomen is soft.     Tenderness: There is no abdominal tenderness. There is no guarding.  Skin:    General: Skin is warm and dry.  Neurological:     Mental Status: He is alert and oriented to person, place, and time.  Psychiatric:        Behavior: Behavior normal.     Imaging: CT Angio Abd/Pel W and/or Wo Contrast Result Date: 10/24/2024 EXAM: CTA ABDOMEN AND PELVIS WITHOUT AND WITH CONTRAST 10/24/2024 07:57:02 AM TECHNIQUE: CTA images of the abdomen and pelvis without and with intravenous contrast. Three-dimensional MIP/volume rendered formations were performed. Automated exposure control, iterative reconstruction, and/or weight based adjustment of the mA/kV was utilized to reduce the radiation dose to as low as reasonably achievable. COMPARISON: CTA of the abdomen and pelvis 09/01/2024. CLINICAL HISTORY: 78 year old male with a history of endograft repair of abdominal aortic aneurysm. FINDINGS:  VASCULATURE: AORTA: Bifurcated abdominal aortic endograft is patent.  Evidence of endoleak at the proximal bifurcated portion of the device (series 6 images 108 through 121 and delayed series 13 image 45). The underlying native aneurysm sac has not significantly diminished since September and some images suggest it may be 1 or 2 mm larger (series 13 image 49 approximately 52 to 53 mm versus 51 mm at the same level previously; coronal image 27 of series 15 suggesting 53 mm; sagittal image 109 of series 10 suggesting 51 mm). Underlying advanced aortic atherosclerosis and chronic infrarenal abdominal aortic aneurysm which measured up to 5.3 cm diameter in September. Superimposed coil embolization of the left internal iliac artery. Advanced calcified iliac atherosclerosis bilaterally. Proximal femoral artery atherosclerosis. The major arterial structures in the abdomen and pelvis remain patent. On the delayed images the portal venous system appears patent. . . No early arterial contrast extravasation into the bowel is identified.however, on only the delayed images there does appear to be a small volume of puddling blood or contrast within the stomach along the greater curve series 13 image 26. LIVER: The liver is unremarkable. GALLBLADDER AND BILE DUCTS: Gallbladder is unremarkable. No biliary ductal dilatation. Vicarious contrast excretion to the gallbladder. SPLEEN: The spleen is unremarkable. PANCREAS: The pancreas is unremarkable. ADRENAL GLANDS: Chronically abnormal left adrenal gland has both low density and calcified components stable since September, and reportedly no new findings have been obtained so far from a 2016 CT suggesting benign etiology (no follow up imaging recommended). Right adrenal gland demonstrates no acute abnormality. KIDNEYS, URETERS AND BLADDER: Stable polycystic renal disease which appears benign (no follow up imaging recommended). On the delayed images early renal contrast excretion appears symmetric. No stones in the kidneys or ureters. No hydronephrosis.  No perinephric or periureteral stranding. Small volume gas within the urinary bladder, suspicious for gas forming infection unless explained by recent catheterization (series 6 image 199). GI AND BOWEL: Stomach and duodenal sweep demonstrate no acute abnormality. Severe diverticulosis of the sigmoid colon in the left lower quadrant and pelvis. No active inflammation. Moderate diverticulosis of the descending colon. Nondilated bowel. Normal appendix on coronal image (series 6 image 173). No free air or free fluid identified. REPRODUCTIVE: Reproductive organs are unremarkable. PERITONEUM AND RETROPERITONEUM: No ascites or free air. No evidence of retroperitoneal hemorrhage. LUNG BASE: Severe irregularity and atherosclerosis of the visible descending thoracic aorta. Multifocal irregular mural plaque or thrombus. Caliber of the vessel is stable. Underlying calcified atherosclerosis. Normal heart size. No pericardial or pleural effusion. Mild lung base atelectasis or scarring. LYMPH NODES: No lymphadenopathy. BONES AND SOFT TISSUES: Chronic severe degeneration in the spine superimposed on previous lower lumbar decompression and fusion. Insert bones. No acute soft tissue abnormality. IMPRESSION: 1. CTA suggests slow but active bleeding into the gastric lumen along the great curve. See series 13 image 26. 2. Positive also for Endoleak at or near Endograft graft bifurcation; underlying abdominal aortic aneurysm with native aneurysm sac not significantly diminished since 09/01/2024 (up to 53 mm). 3. Underlying Severe thoracic and abdominal aortic atherosclerosis. major arterial structures remain patent. 4. Small volume intravesical gas, suspicious for gas-forming urinary infection if no recent catheterization. Electronically signed by: Helayne Hurst MD 10/24/2024 08:34 AM EST RP Workstation: [id number]   PERIPHERAL VASCULAR CATHETERIZATION Result Date: 10/21/2024 See surgical note for result.   Labs:  CBC: Recent Labs     10/20/24 0920 10/22/24 0614 10/24/24 0731 10/24/24 1217  WBC 7.8 10.1 13.0* 11.0*  HGB 10.9* 9.4* 7.8*  6.9*  HCT 33.6* 29.8* 25.0* 21.7*  PLT 294 219 215 149*    COAGS: Recent Labs    06/23/24 1545 10/24/24 0731  INR 1.1 1.2  APTT 31 33    BMP: Recent Labs    06/23/24 1545 10/20/24 0920 10/22/24 0614 10/24/24 0731  NA 141 144 141 140  K 3.7 4.0 3.7 3.5  CL 109 105 106 106  CO2 22 27 24  21*  GLUCOSE 105* 100* 126* 143*  BUN 24* 27* 21 39*  CALCIUM  9.1 9.1 8.3* 8.2*  CREATININE 0.96 1.02 1.11 1.14  GFRNONAA >60 >60 >60 >60    LIVER FUNCTION TESTS: Recent Labs    06/22/24 1603 06/23/24 1545 10/24/24 0731  BILITOT 0.5 0.4 0.7  AST 23 21 19   ALT 26 23 11   ALKPHOS 66 78 61  PROT 5.9* 6.5 6.3*  ALBUMIN 3.4* 3.8 3.0*    TUMOR MARKERS: No results for input(s): AFPTM, CEA, CA199, CHROMGRNA in the last 8760 hours.  Assessment and Plan:  GI Bleed s/p recent endovascular stent grafting of AAA on 11/5: Albert [name] is a 78 y.o. male with a history of dementia, HTN, CVA, and AAA s/p recent endovascular stent grafting on 11/5 who presented to the ED this morning after a witnessed fall and episode of hematemesis. Patient found to have active GI bleed that was unable to be controlled by GI during EGD. IR subsequently consulted for possible embolization and case approved by Dr. VEAR Lent.  Procedure to be performed under moderate sedation.  -NPO since midnight -INR 1.2 this morning -H/H 6.9/21.7 at noon < 7.8/25.0 on admission -Undergoing additional transfusion of PRBC -BUN/Cr 39/1.14 -Plan for angiogram with possible embolization with today in IR   The Risks and benefits of embolization were discussed with the patient including, but not limited to bleeding, infection, vascular injury, post operative pain, or contrast induced renal failure.  This procedure involves the use of X-rays and because of the nature of the planned procedure, it is  possible that we will have prolonged use of X-ray fluoroscopy.  Potential radiation risks to you include (but are not limited to) the following: - A slightly elevated risk for cancer several years later in life. This risk is typically less than 0.5% percent. This risk is low in comparison to the normal incidence of human cancer, which is 33% for women and 50% for men according to the [employer] Society. - Radiation induced injury can include skin redness, resembling a rash, tissue breakdown / ulcers and hair loss (which can be temporary or permanent).   The likelihood of either of these occurring depends on the difficulty of the procedure and whether you are sensitive to radiation due to previous procedures, disease, or genetic conditions.   IF your procedure requires a prolonged use of radiation, you will be notified and given written instructions for further action.  It is your responsibility to monitor the irradiated area for the 2 weeks following the procedure and to notify your physician if you are concerned that you have suffered a radiation induced injury.    All of the patient's questions were answered, patient is agreeable to proceed. Consent signed and in chart.   Thank you for allowing our service to participate in Albert [name] 's care.    Electronically Signed: Annalysse Shoemaker M Leontine Radman, PA-C   10/24/2024, 1:47 PM     I spent a total of 40 Minutes in face to face in clinical consultation, greater  than 50% of which was counseling/coordinating care for gastric bleed with possible embolization.

## 2024-10-24 NOTE — Care Plan (Signed)
 Brief GI Care Plan Note  Pt underwent EGD today  Large amount of blood and clot- specifically in the fundus and greater curvature of the stomach. Despite extended attempt, position changes, and roth net- unable to get blood/clot cleared away from this area enough to visualize the source of bleeding. Some bright red blood was also appreciated in this area, raising concern for active bleeding.  Pt to remain intubated and is going to ICU after I spoke with PCCM physician. I also spoke with IR who will attempt to embolize later today. Current plan is to repeat egd tomorrow in hopes to better evaluate this area. Will provide reglan 10 mg iv q6 h (3 doses). He did not receive the original ordered reglan today in the ED  Blood bank to remain 1 unit ahead in case further blood product needed given active bleeding.  I have updated his wife at bedside.  Will continue to monitor  Elspeth EMERSON Jungling, DO [facility]

## 2024-10-24 NOTE — ED Triage Notes (Addendum)
 Pt BIB [facility] from home due to a mechanical fall. Pt was walking to bathroom, became dizzy when he fell. Denies LOC or hitting head. Wife states that pt became sweaty and cold when returned to the bed. Endorses having one episode of bloody emesis immediately after returning to bed. Hx AAA repair without rupture on the 5th of November. On Plavix    140/80 110 HR 98 RA 17 RR 154 CBG

## 2024-10-24 NOTE — ED Notes (Signed)
 Report given to OR nurse

## 2024-10-24 NOTE — Procedures (Signed)
Central Venous Catheter Placement:  TRIPLE LUMEN     Procedure: Insertion of Non-tunneled Central Venous Catheter(36556) with US guidance (00174)    Indication(s) Medication administration and Difficult access  CVP monitoring Patient receiving vesicant or irritant drug.; Patient receiving intravenous therapy for longer than 5 days.; Patient has limited or no vascular access.      Consent Risks of the procedure as well as the alternatives and risks of each were explained to the patient and/or caregiver.  Consent for the procedure was obtained and is signed in the bedside chart   Consent-verbal/written         Timeout Verified patient identification, verified procedure, site/side was marked, verified correct patient position, special equipment/implants available, medications/allergies/relevant history reviewed, required imaging and test results available. Patient comfort was obtained.     Sterile Technique Maximal sterile technique including full sterile barrier drape, hand hygiene, sterile gown, sterile gloves, mask, hair covering, sterile ultrasound probe cover (if used).   Hand washing performed prior to starting the procedure.      Procedure Description Area of catheter insertion was cleaned with chlorhexidine and draped in sterile fashion.  With real-time ultrasound guidance a central venous catheter was placed into the right internal jugular vein. Nonpulsatile blood flow and easy flushing noted in all ports.  The catheter was sutured in place and sterile dressing applied.   A triple lumen catheter was placed in Right Internal Jugular Vein There was good blood return, catheter caps were placed on lumens, catheter flushed easily, the line was secured and a sterile dressing and BIO-PATCH applied.      Complications/Tolerance None; patient tolerated the procedure well. Chest X-ray is ordered to verify placement     EBL Minimal   Specimen(s) None     Number of Attempts:  1 Complications:none Estimated Blood Loss: none    [name], M.D.  Pulmonary & [place]

## 2024-10-24 NOTE — Op Note (Signed)
 [facility] Gastroenterology Patient Name: Albert [name] Procedure Date: 10/24/2024 9:44 AM MRN: [id number] Account #: [id number] Date of Birth: May 09, 1946 Admit Type: Emergency Department Age: 78 Room: [facility] ENDO ROOM 4 Gender: Male Note Status: Supervisor Override Instrument Name: Bleeder Scope [id number] Procedure:             Upper GI endoscopy Indications:           Hematemesis, Active gastrointestinal bleeding Providers:             Elspeth Ozell Onita ROSALEA, DO Referring MD:          [name] ORN. Lenon MD, MD (Referring MD) Medicines:             Monitored Anesthesia Care Complications:         No immediate complications. Estimated blood loss:                         Minimal. Procedure:             Pre-Anesthesia Assessment:                        - Prior to the procedure, a History and Physical was                         performed, and patient medications and allergies were                         reviewed. The patient is unable to give consent                         secondary to the patient being legally incompetent to                         consent. The risks and benefits of the procedure and                         the sedation options and risks were discussed with the                         patient's spouse. All questions were answered and                         informed consent was obtained. Patient identification                         and proposed procedure were verified by the physician,                         the nurse, the anesthesiologist, the anesthetist and                         the technician in the endoscopy suite. Mental Status                         Examination: alert but confused. Airway Examination:                         normal oropharyngeal airway and neck mobility.  Respiratory Examination: clear to auscultation. CV                         Examination: RRR, no murmurs, no S3 or S4.                          Prophylactic Antibiotics: The patient does not require                         prophylactic antibiotics. Prior Anticoagulants: The                         patient has taken Plavix  (clopidogrel ), last dose was                         1 day prior to procedure. ASA Grade Assessment: E -                         Emergency. After reviewing the risks and benefits, the                         patient was deemed in satisfactory condition to                         undergo the procedure. The anesthesia plan was to use                         general anesthesia. Immediately prior to                         administration of medications, the patient was                         re-assessed for adequacy to receive sedatives. The                         heart rate, respiratory rate, oxygen saturations,                         blood pressure, adequacy of pulmonary ventilation, and                         response to care were monitored throughout the                         procedure. The physical status of the patient was                         re-assessed after the procedure.                        After obtaining informed consent, the endoscope was                         passed under direct vision. Throughout the procedure,                         the patient's blood pressure, pulse, and oxygen  saturations were monitored continuously. The Endoscope                         was introduced through the mouth, and advanced to the                         second part of duodenum. The upper GI endoscopy was                         accomplished without difficulty. The patient tolerated                         the procedure well. Findings:      Red blood was found in the entire duodenum. no source of bleeding in the       duodenum. blood from proximal stomach. Estimated blood loss: none.      Red blood was found in the lower third of the esophagus. No source of       bleeding in the  esophagus upon washing. Blood from proximal stomach back       washing into lower espohagus Estimated blood loss: none.      Clotted blood, fresh blood, and old blood was found in the gastric       fundus, in the gastric body and in the entire examined stomach. Large       amount of blood and clot in the fundus/greater curvature which matches       where CTA saw slow, active extravasation. The rest of the gastric lumen       was examined with no other appreciated active sources of bleeding.       Attempted to remove clot with suction, lavage, and roth net with minimal       success as clot was fairly adherent. Due to this, unable to outright       visualize the source of bleeding despite patient positional changes in       attempt to use gravity to pull blood/fluid towards the antrum. Impression:            - Blood in the entire examined duodenum.                        - Red blood in the lower third of the esophagus.                        - Clotted blood, fresh blood, and old blood in the                         gastric fundus, in the gastric body and in the entire                         stomach.                        - No specimens collected. Recommendation:        - Return patient to ICU for ongoing care.                        - NPO.                        -  Patient to remain intubated.                        Protonix 40 mg iv q12h                        Would stay at least 1 unit of prbc ahead as continued                         bleeding expected.                        I have discussed case with IR, PCCM, and hospitalist                        PCCM to accept to ICU                        IR plan for attempt at embolization today. This is a                         difficult area to embolize, so plan for repeat upper                         endoscopy tomorrow.                        Will provide reglan to allow for continued stomach                         clearance overnight.                         Patient's wife updated at bedside by me.                        - Repeat upper endoscopy tomorrow for retreatment.                        - The findings and recommendations were discussed with                         the patient's family.                        - The findings and recommendations were discussed with                         the referring physician. Procedure Code(s):     --- Professional ---                        [phone], Esophagogastroduodenoscopy, flexible,                         transoral; diagnostic, including collection of                         specimen(s) by brushing or washing, when performed                         (separate procedure) Diagnosis  Code(s):     --- Professional ---                        K92.2, Gastrointestinal hemorrhage, unspecified                        K22.89, Other specified disease of esophagus                        K92.0, Hematemesis CPT copyright 2022 [employer]. All rights reserved. The codes documented in this report are preliminary and upon coder review may  be revised to meet current compliance requirements. Attending Participation:      I personally performed the entire procedure. Elspeth Jungling, DO Elspeth Ozell Jungling DO, DO 10/24/2024 11:57:55 AM This report has been signed electronically. Number of Addenda: 0 Note Initiated On: 10/24/2024 9:44 AM Estimated Blood Loss:  Estimated blood loss was minimal.      [facility]

## 2024-10-24 NOTE — ED Provider Notes (Signed)
 [facility] Provider Note    Event Date/Time   First MD Initiated Contact with Patient 10/24/24 [phone]     (approximate)   History   Fall   HPI  Albert Schwarz. is a 78 y.o. male with a history of dementia, CVA, hypertension, AAA who underwent endovascular stent grafting of AAA 2 days ago per review of records.  Reportedly last night patient got up to go to the bathroom and fell, as his wife was helping him up he vomited blood.  EMS was contacted, he again had an episode of hematemesis with EMS.  He denies abdominal pain.  He complains only of mild right low back pain which he reports is chronic for him.     Physical Exam   Triage Vital Signs: ED Triage Vitals  Encounter Vitals Group     BP 10/24/24 0725 (!) 143/80     Girls Systolic BP Percentile --      Girls Diastolic BP Percentile --      Boys Systolic BP Percentile --      Boys Diastolic BP Percentile --      Pulse Rate 10/24/24 0725 (!) 118     Resp 10/24/24 0725 18     Temp 10/24/24 0725 98.1 F (36.7 C)     Temp Source 10/24/24 0725 Oral     SpO2 10/24/24 0724 100 %     Weight --      Height --      Head Circumference --      Peak Flow --      Pain Score 10/24/24 0725 3     Pain Loc --      Pain Education --      Exclude from Growth Chart --     Most recent vital signs: Vitals:   10/24/24 0800 10/24/24 0845  BP: (!) 149/72 (!) 144/78  Pulse: (!) 111 99  Resp: (!) 22 19  Temp:  98.3 F (36.8 C)  SpO2:  100%     General: Awake, dried blood on the shirt CV:  Good peripheral perfusion.  Tachycardia Resp:  Normal effort.  Clear to auscultation bilaterally Abd:  No distention.  Soft, nontender, no pulsatility Other:  Normal pulses in the extremities bilaterally   ED Results / Procedures / Treatments   Labs (all labs ordered are listed, but only abnormal results are displayed) Labs Reviewed  CBC - Abnormal; Notable for the following components:      Result Value   WBC  13.0 (*)    RBC 2.64 (*)    Hemoglobin 7.8 (*)    HCT 25.0 (*)    All other components within normal limits  COMPREHENSIVE METABOLIC PANEL WITH GFR - Abnormal; Notable for the following components:   CO2 21 (*)    Glucose, Bld 143 (*)    BUN 39 (*)    Calcium  8.2 (*)    Total Protein 6.3 (*)    Albumin 3.0 (*)    All other components within normal limits  PROTIME-INR - Abnormal; Notable for the following components:   Prothrombin Time 15.6 (*)    All other components within normal limits  TROPONIN I (HIGH SENSITIVITY) - Abnormal; Notable for the following components:   Troponin I (High Sensitivity) 33 (*)    All other components within normal limits  APTT  TYPE AND SCREEN  PREPARE RBC (CROSSMATCH)     EKG  ED ECG REPORT I, Lamar Price, the attending  physician, personally viewed and interpreted this ECG.  Date: 10/24/2024  Rhythm: normal sinus rhythm QRS Axis: normal Intervals: normal ST/T Wave abnormalities: Nonspecific change Narrative Interpretation: no evidence of acute ischemia    RADIOLOGY CT angiography demonstrates slow but active bleeding into the gastric lumen    PROCEDURES:  Critical Care performed: yes  CRITICAL CARE Performed by: Lamar Price   Total critical care time: 35 minutes  Critical care time was exclusive of separately billable procedures and treating other patients.  Critical care was necessary to treat or prevent imminent or life-threatening deterioration.  Critical care was time spent personally by me on the following activities: development of treatment plan with patient and/or surrogate as well as nursing, discussions with consultants, evaluation of patient's response to treatment, examination of patient, obtaining history from patient or surrogate, ordering and performing treatments and interventions, ordering and review of laboratory studies, ordering and review of radiographic studies, pulse oximetry and re-evaluation of  patient's condition.   Procedures   MEDICATIONS ORDERED IN ED: Medications  0.9 %  sodium chloride  infusion (has no administration in time range)  metoCLOPramide (REGLAN) injection 10 mg (has no administration in time range)  ondansetron  (ZOFRAN ) injection 4 mg (4 mg Intravenous Given 10/24/24 0757)  0.9 %  sodium chloride  infusion (0 mLs Intravenous Stopped 10/24/24 0851)  pantoprazole (PROTONIX) injection 40 mg (40 mg Intravenous Given 10/24/24 0757)  iohexol  (OMNIPAQUE ) 350 MG/ML injection 100 mL (100 mLs Intravenous Contrast Given 10/24/24 0740)     IMPRESSION / MDM / ASSESSMENT AND PLAN / ED COURSE  I reviewed the triage vital signs and the nursing notes. Patient's presentation is most consistent with acute presentation with potential threat to life or bodily function.  Patient presents with hematemesis 2 days after endograft placement for AAA repair.  Will send for stat CT angiography of the abdomen pelvis given wife reports possible syncopal episode, have ordered IV fluids, IV Zofran , type and screen ----------------------------------------- 8:01 AM on 10/24/2024 ----------------------------------------- Consulted with Dr. Laurence who is on-call for vascular surgery, he will look at the CT images but feels unlikely to be related to the recent surgery.  Pending radiology read, on my review and interpretation of CT no obvious extravasation   ----------------------------------------- 8:33 AM on 10/24/2024 ----------------------------------------- Patient's hemoglobin is 7.8, down from 9.4, 2 days ago, blood loss from surgery was minimal per op note, given hematemesis consistent with significant GI bleeding, will transfuse 1 unit  ----------------------------------------- 8:41 AM on 10/24/2024 ----------------------------------------- Per radiology read, CT demonstrates slow but active bleeding into the gastric lumen, will consult with  gastroenterology  ----------------------------------------- 8:53 AM on 10/24/2024 ----------------------------------------- Discussed with Dr. Onita of GI, he will see the patient in the emergency department.  Dr. Laurence is reviewing the CT scan I have consulted internal medicine for admission      FINAL CLINICAL IMPRESSION(S) / ED DIAGNOSES   Final diagnoses:  Acute upper GI bleed     Rx / DC Orders   ED Discharge Orders     None        Note:  This document was prepared using Dragon voice recognition software and may include unintentional dictation errors.   Price Lamar, MD 10/24/24 [phone]

## 2024-10-24 NOTE — H&P (View-Only) (Signed)
 Inpatient Consultation   Patient ID: Albert [name] is a 78 y.o. male.  Requesting Provider: Lamar Price, MD  Date of Admission: 10/24/2024  Date of Consult: 10/24/24   Reason for Consultation: hematemesis   Patient's Chief Complaint:   Chief Complaint  Patient presents with   Fall    78 y/o CM c barretts esophagus, chronic pain, dementia, cva, aortic aneurysm s/p recent endograft (10/21/24) who presents to the hospital with hematemesis.  Wife provides independent history.  Pt underwent his graft w/o issue on 11/5. This morning, he had a syncopal episode and told his wife he was feeling weak. Upon lying in bed, he reported to her he felt dizzy and threw up dark blood and clot per his wife. He has been taking nsaids and anti plt as below. CTA performed with active extravasation in the gastric lumen. P/w hgb of 7.8 (From 10.9 on 11/4) and is receiving 1 u prbc now. No hotn, but is tachycardic at time of evaluation. Notes some abdominal discomfort. Denies melena/hematochezia.  Endoleak appreciated on CTA- per EM physician d/w vascular surgery- aortogastric fistula unlikely this soon after surgery. See vascular surgery note.  On plavix - last dose yesterday 200mg  of advil  every 6 hours- has been taking daily for some time Additionally on asa 81 daily and occasional aleve  Denies family history of gastrointestinal disease and malignancy Previous Endoscopies: Wife reports previous EGD and colonoscopy EGD in 2014 - but no information in care everywhere    Past Medical History:  Diagnosis Date   Anemia    Anxiety    a.) on BZO (alproazolam) PRN   Arthritis of both knees    Back pain    Barrett esophagus    Chronic back pain    Dementia (HCC)    arising in the senium and presenium   Displaced fracture of proximal phalanx of right lesser toe(s), initial encounter for closed fracture    Diverticulosis    GERD (gastroesophageal reflux disease)    OTC meds as needed    Headache    History of colon polyps    benign    History of kidney stones    History of migraine    Hyperlipidemia    Hypertension    Insomnia    Joint pain    Nephrolithiasis    Pre-diabetes    Stroke [facility]    without residual deficits    Past Surgical History:  Procedure Laterality Date   AMPUTATION TOE Right 04/13/2022   Procedure: 28820 - AMPUTATION TOE;  Surgeon: Ashley Soulier, DPM;  Location: [facility];  Service: Podiatry;  Laterality: Right;   ANKLE ARTHROSCOPY Right 04/13/2022   Procedure: A-SCOPE/OCD REPAIR;  Surgeon: Ashley Soulier, DPM;  Location: [facility];  Service: Podiatry;  Laterality: Right;   ANKLE SURGERY Right 1997   ankles/screws    ARTHROSCOPIC REPAIR ACL Right 1993   BACK SURGERY  2017   titanium rods and pins in lower back   COLONOSCOPY     ENDOVASCULAR STENT GRAFT (AAA) N/A 10/21/2024   Procedure: ENDOVASCULAR STENT GRAFT (AAA);  Surgeon: Marea Selinda RAMAN, MD;  Location: [facility];  Service: Cardiovascular;  Laterality: N/A;   EPIDIDYMECTOMY Left 04/10/2017   Procedure: EPIDIDYMECTOMY;  Surgeon: Rosina Riis, MD;  Location: [facility];  Service: Urology;  Laterality: Left;   ESOPHAGOGASTRODUODENOSCOPY     LUMBAR FUSION  March 13, 2016   L4/L5 fusion   MINOR HARDWARE REMOVAL Right 04/13/2022   Procedure: REMOVAL  SCREW; DEEP;  Surgeon: Ashley Soulier, DPM;  Location: [facility];  Service: Podiatry;  Laterality: Right;   TONSILLECTOMY  1962   adenoidectomy    VASECTOMY  1973   WOUND DEBRIDEMENT Right 04/13/2022   Procedure: 29906 - SUBTALAR ARTHROSCOPY;  Surgeon: Ashley Soulier, DPM;  Location: [facility];  Service: Podiatry;  Laterality: Right;    Allergies  Allergen Reactions   Codeine Nausea And Vomiting    Family History  Problem Relation Age of Onset   Arthritis Mother    Esophageal cancer Father    Prostate cancer Neg Hx    Bladder Cancer Neg Hx    Kidney cancer Neg Hx     Social History   Tobacco Use   Smoking status: Every Day     Current packs/day: 0.50    Average packs/day: 0.5 packs/day for 50.0 years (25.0 ttl pk-yrs)    Types: Cigarettes   Smokeless tobacco: Never   Tobacco comments:    Half a pack a day, per wife.  Vaping Use   Vaping status: Never Used  Substance Use Topics   Alcohol use: No   Drug use: No     Pertinent GI related history and allergies were reviewed with the patient  Review of Systems  Reason unable to perform ROS: provided by patient's wife.  Constitutional:  Positive for fatigue. Negative for chills, diaphoresis, fever and unexpected weight change.  HENT:  Negative for trouble swallowing and voice change.   Respiratory:  Negative for shortness of breath and wheezing.   Cardiovascular:  Negative for chest pain, palpitations and leg swelling.  Gastrointestinal:  Positive for abdominal pain, nausea and vomiting. Negative for abdominal distention, anal bleeding, blood in stool, constipation and diarrhea.  Skin:  Positive for pallor. Negative for color change.  Neurological:  Positive for syncope and weakness. Negative for dizziness.  All other systems reviewed and are negative.    Medications Home Medications No current facility-administered medications on file prior to encounter.   Current Outpatient Medications on File Prior to Encounter  Medication Sig Dispense Refill   acetaminophen  (TYLENOL ) 325 MG tablet Take 650 mg by mouth every 8 (eight) hours as needed for moderate pain.     ALPRAZolam  (XANAX ) 0.25 MG tablet Take 0.25 mg by mouth at bedtime as needed for anxiety. (Patient not taking: Reported on 10/22/2024)     amLODipine  (NORVASC ) 10 MG tablet Take 10 mg by mouth daily.     aspirin  EC 81 MG tablet Take 81 mg by mouth daily.     clopidogrel  (PLAVIX ) 75 MG tablet 75 mg daily.     cyanocobalamin  (VITAMIN B12) 1000 MCG tablet Take 1,000 mcg by mouth.  Take 1,000 mcg by mouth once daily     diphenhydramine-acetaminophen  (TYLENOL  PM) 25-500 MG TABS tablet Take 1 tablet by mouth  at bedtime.     donepezil  (ARICEPT ) 10 MG tablet Take 10 mg by mouth.  TAKE 1 TABLET(10 MG) BY MOUTH AT BEDTIME     DULoxetine  (CYMBALTA ) 60 MG capsule Take 60 mg by mouth daily.     ferrous sulfate  325 (65 FE) MG tablet Take 325 mg by mouth.  Take 325 mg by mouth daily with breakfast     ibuprofen  (ADVIL ) 400 MG tablet Take 400 mg by mouth every 6 (six) hours as needed.     Multiple Vitamin (MULTIVITAMIN WITH MINERALS) TABS tablet Take 1 tablet by mouth daily.     naphazoline-pheniramine (NAPHCON-A) 0.025-0.3 % ophthalmic solution Place 1 drop into  both eyes 4 (four) times daily as needed for eye irritation.     naproxen sodium (ALEVE) 220 MG tablet Take 220 mg by mouth daily as needed.     QUEtiapine  (SEROQUEL ) 25 MG tablet Take 25 mg by mouth at bedtime.     rosuvastatin  (CRESTOR ) 20 MG tablet Take 20 mg by mouth daily.     trolamine salicylate (ASPERCREME) 10 % cream Apply 1 application topically as needed for muscle pain.     Pertinent GI related medications were reviewed with the patient  Inpatient Medications  Current Facility-Administered Medications:    0.9 %  sodium chloride  infusion, 10 mL/hr, Intravenous, Once, Arlander Charleston, MD  Current Outpatient Medications:    acetaminophen  (TYLENOL ) 325 MG tablet, Take 650 mg by mouth every 8 (eight) hours as needed for moderate pain., Disp: , Rfl:    ALPRAZolam  (XANAX ) 0.25 MG tablet, Take 0.25 mg by mouth at bedtime as needed for anxiety. (Patient not taking: Reported on 10/22/2024), Disp: , Rfl:    amLODipine  (NORVASC ) 10 MG tablet, Take 10 mg by mouth daily., Disp: , Rfl:    aspirin  EC 81 MG tablet, Take 81 mg by mouth daily., Disp: , Rfl:    clopidogrel  (PLAVIX ) 75 MG tablet, 75 mg daily., Disp: , Rfl:    cyanocobalamin  (VITAMIN B12) 1000 MCG tablet, Take 1,000 mcg by mouth.  Take 1,000 mcg by mouth once daily, Disp: , Rfl:    diphenhydramine-acetaminophen  (TYLENOL  PM) 25-500 MG TABS tablet, Take 1 tablet by mouth at bedtime., Disp: ,  Rfl:    donepezil  (ARICEPT ) 10 MG tablet, Take 10 mg by mouth.  TAKE 1 TABLET(10 MG) BY MOUTH AT BEDTIME, Disp: , Rfl:    DULoxetine  (CYMBALTA ) 60 MG capsule, Take 60 mg by mouth daily., Disp: , Rfl:    ferrous sulfate  325 (65 FE) MG tablet, Take 325 mg by mouth.  Take 325 mg by mouth daily with breakfast, Disp: , Rfl:    ibuprofen  (ADVIL ) 400 MG tablet, Take 400 mg by mouth every 6 (six) hours as needed., Disp: , Rfl:    Multiple Vitamin (MULTIVITAMIN WITH MINERALS) TABS tablet, Take 1 tablet by mouth daily., Disp: , Rfl:    naphazoline-pheniramine (NAPHCON-A) 0.025-0.3 % ophthalmic solution, Place 1 drop into both eyes 4 (four) times daily as needed for eye irritation., Disp: , Rfl:    naproxen sodium (ALEVE) 220 MG tablet, Take 220 mg by mouth daily as needed., Disp: , Rfl:    QUEtiapine  (SEROQUEL ) 25 MG tablet, Take 25 mg by mouth at bedtime., Disp: , Rfl:    rosuvastatin  (CRESTOR ) 20 MG tablet, Take 20 mg by mouth daily., Disp: , Rfl:    trolamine salicylate (ASPERCREME) 10 % cream, Apply 1 application topically as needed for muscle pain., Disp: , Rfl:   sodium chloride          Objective   Vitals:   10/24/24 0725 10/24/24 0726 10/24/24 0800 10/24/24 0845  BP: (!) 143/80  (!) 149/72 (!) 144/78  Pulse: (!) 118  (!) 111 99  Resp: 18  (!) 22 19  Temp: 98.1 F (36.7 C)   98.3 F (36.8 C)  TempSrc: Oral   Oral  SpO2: 100%   100%  Weight:  90 kg    Height:  6' 1 (1.854 m)       Physical Exam Vitals and nursing note reviewed.  Constitutional:      General: He is not in acute distress.    Appearance: He is  ill-appearing. He is not toxic-appearing or diaphoretic.  HENT:     Head: Normocephalic and atraumatic.     Nose: Nose normal.     Mouth/Throat:     Mouth: Mucous membranes are moist.     Pharynx: Oropharynx is clear.  Eyes:     General: No scleral icterus.    Extraocular Movements: Extraocular movements intact.  Cardiovascular:     Rate and Rhythm: Regular rhythm.  Tachycardia present.  Pulmonary:     Effort: Pulmonary effort is normal. No respiratory distress.     Breath sounds: Normal breath sounds. No wheezing, rhonchi or rales.  Abdominal:     General: Bowel sounds are normal. There is no distension.     Palpations: Abdomen is soft.     Tenderness: There is no abdominal tenderness. There is no guarding or rebound.  Musculoskeletal:     Cervical back: Neck supple.     Right lower leg: No edema.     Left lower leg: No edema.  Skin:    General: Skin is warm and dry.     Coloration: Skin is pale. Skin is not jaundiced.  Neurological:     Mental Status: He is alert. Mental status is at baseline.     Laboratory Data Recent Labs  Lab 10/20/24 0920 10/22/24 0614 10/24/24 0731  WBC 7.8 10.1 13.0*  HGB 10.9* 9.4* 7.8*  HCT 33.6* 29.8* 25.0*  PLT 294 219 215   Recent Labs  Lab 10/20/24 0920 10/22/24 0614 10/24/24 0731  NA 144 141 140  K 4.0 3.7 3.5  CL 105 106 106  CO2 27 24 21*  BUN 27* 21 39*  CALCIUM  9.1 8.3* 8.2*  PROT  --   --  6.3*  BILITOT  --   --  0.7  ALKPHOS  --   --  61  ALT  --   --  11  AST  --   --  19  GLUCOSE 100* 126* 143*   Recent Labs  Lab 10/24/24 0731  INR 1.2    No results for input(s): LIPASE in the last 72 hours.      Imaging Studies: CT Angio Abd/Pel W and/or Wo Contrast Result Date: 10/24/2024 EXAM: CTA ABDOMEN AND PELVIS WITHOUT AND WITH CONTRAST 10/24/2024 07:57:02 AM TECHNIQUE: CTA images of the abdomen and pelvis without and with intravenous contrast. Three-dimensional MIP/volume rendered formations were performed. Automated exposure control, iterative reconstruction, and/or weight based adjustment of the mA/kV was utilized to reduce the radiation dose to as low as reasonably achievable. COMPARISON: CTA of the abdomen and pelvis 09/01/2024. CLINICAL HISTORY: 78 year old male with a history of endograft repair of abdominal aortic aneurysm. FINDINGS: VASCULATURE: AORTA: Bifurcated abdominal  aortic endograft is patent. Evidence of endoleak at the proximal bifurcated portion of the device (series 6 images 108 through 121 and delayed series 13 image 45). The underlying native aneurysm sac has not significantly diminished since September and some images suggest it may be 1 or 2 mm larger (series 13 image 49 approximately 52 to 53 mm versus 51 mm at the same level previously; coronal image 27 of series 15 suggesting 53 mm; sagittal image 109 of series 10 suggesting 51 mm). Underlying advanced aortic atherosclerosis and chronic infrarenal abdominal aortic aneurysm which measured up to 5.3 cm diameter in September. Superimposed coil embolization of the left internal iliac artery. Advanced calcified iliac atherosclerosis bilaterally. Proximal femoral artery atherosclerosis. The major arterial structures in the abdomen and pelvis remain patent. On the  delayed images the portal venous system appears patent. . . No early arterial contrast extravasation into the bowel is identified.however, on only the delayed images there does appear to be a small volume of puddling blood or contrast within the stomach along the greater curve series 13 image 26. LIVER: The liver is unremarkable. GALLBLADDER AND BILE DUCTS: Gallbladder is unremarkable. No biliary ductal dilatation. Vicarious contrast excretion to the gallbladder. SPLEEN: The spleen is unremarkable. PANCREAS: The pancreas is unremarkable. ADRENAL GLANDS: Chronically abnormal left adrenal gland has both low density and calcified components stable since September, and reportedly no new findings have been obtained so far from a 2016 CT suggesting benign etiology (no follow up imaging recommended). Right adrenal gland demonstrates no acute abnormality. KIDNEYS, URETERS AND BLADDER: Stable polycystic renal disease which appears benign (no follow up imaging recommended). On the delayed images early renal contrast excretion appears symmetric. No stones in the kidneys or  ureters. No hydronephrosis. No perinephric or periureteral stranding. Small volume gas within the urinary bladder, suspicious for gas forming infection unless explained by recent catheterization (series 6 image 199). GI AND BOWEL: Stomach and duodenal sweep demonstrate no acute abnormality. Severe diverticulosis of the sigmoid colon in the left lower quadrant and pelvis. No active inflammation. Moderate diverticulosis of the descending colon. Nondilated bowel. Normal appendix on coronal image (series 6 image 173). No free air or free fluid identified. REPRODUCTIVE: Reproductive organs are unremarkable. PERITONEUM AND RETROPERITONEUM: No ascites or free air. No evidence of retroperitoneal hemorrhage. LUNG BASE: Severe irregularity and atherosclerosis of the visible descending thoracic aorta. Multifocal irregular mural plaque or thrombus. Caliber of the vessel is stable. Underlying calcified atherosclerosis. Normal heart size. No pericardial or pleural effusion. Mild lung base atelectasis or scarring. LYMPH NODES: No lymphadenopathy. BONES AND SOFT TISSUES: Chronic severe degeneration in the spine superimposed on previous lower lumbar decompression and fusion. Insert bones. No acute soft tissue abnormality. IMPRESSION: 1. CTA suggests slow but active bleeding into the gastric lumen along the great curve. See series 13 image 26. 2. Positive also for Endoleak at or near Endograft graft bifurcation; underlying abdominal aortic aneurysm with native aneurysm sac not significantly diminished since 09/01/2024 (up to 53 mm). 3. Underlying Severe thoracic and abdominal aortic atherosclerosis. major arterial structures remain patent. 4. Small volume intravesical gas, suspicious for gas-forming urinary infection if no recent catheterization. Electronically signed by: Helayne Hurst MD 10/24/2024 08:34 AM EST RP Workstation: [id number]    Assessment:   # Hematemesis 2/2 UGIB - suspected ulcer given heavy nsaid use and anti plt  use  # symptomatic anemia with syncopal episode  # recent endograft surgery for aortic aneurysm   # dementia  # chronic pain  # h/o CVA  Plan:  Esophagogastroduodenoscopy planned for today pending patient stability and endoscopy suite availability NPO at now.  Labs reviewed Reglan 10 mg now Protonix 40 mg iv q12 h Hold dvt ppx  Monitor H&H.  Transfusion and resuscitation as per primary team Avoid frequent lab draws to prevent lab induced anemia Supportive care and antiemetics as per primary team Maintain two sites IV access Avoid nsaids Monitor for GIB.  Esophagogastroduodenoscopy with possible biopsy, control of bleeding, polypectomy, and interventions as necessary has been discussed with the patient/patient representative. Informed consent was obtained from the patient/patient representative after explaining the indication, nature, and risks of the procedure including but not limited to death, bleeding, perforation, missed neoplasm/lesions, cardiorespiratory compromise, and reaction to medications. Opportunity for questions was given and appropriate answers were  provided. Patient/patient representative has verbalized understanding is amenable to undergoing the procedure.  Management of other medical comorbidities as per primary team  I personally performed the service.  Thank you for allowing us  to participate in this patient's care. Please don't hesitate to call if any questions or concerns arise.   Elspeth Ozell Jungling, DO [facility]  Portions of the record may have been created with voice recognition software. Occasional wrong-word or 'sound-a-like' substitutions may have occurred due to the inherent limitations of voice recognition software.  Read the chart carefully and recognize, using context, where substitutions may have occurred.

## 2024-10-24 NOTE — Consult Note (Signed)
 Inpatient Consultation   Patient ID: Albert [name] is a 78 y.o. male.  Requesting Provider: Lamar Price, MD  Date of Admission: 10/24/2024  Date of Consult: 10/24/24   Reason for Consultation: hematemesis   Patient's Chief Complaint:   Chief Complaint  Patient presents with   Fall    78 y/o CM c barretts esophagus, chronic pain, dementia, cva, aortic aneurysm s/p recent endograft (10/21/24) who presents to the hospital with hematemesis.  Wife provides independent history.  Pt underwent his graft w/o issue on 11/5. This morning, he had a syncopal episode and told his wife he was feeling weak. Upon lying in bed, he reported to her he felt dizzy and threw up dark blood and clot per his wife. He has been taking nsaids and anti plt as below. CTA performed with active extravasation in the gastric lumen. P/w hgb of 7.8 (From 10.9 on 11/4) and is receiving 1 u prbc now. No hotn, but is tachycardic at time of evaluation. Notes some abdominal discomfort. Denies melena/hematochezia.  Endoleak appreciated on CTA- per EM physician d/w vascular surgery- aortogastric fistula unlikely this soon after surgery. See vascular surgery note.  On plavix - last dose yesterday 200mg  of advil  every 6 hours- has been taking daily for some time Additionally on asa 81 daily and occasional aleve  Denies family history of gastrointestinal disease and malignancy Previous Endoscopies: Wife reports previous EGD and colonoscopy EGD in 2014 - but no information in care everywhere    Past Medical History:  Diagnosis Date   Anemia    Anxiety    a.) on BZO (alproazolam) PRN   Arthritis of both knees    Back pain    Barrett esophagus    Chronic back pain    Dementia (HCC)    arising in the senium and presenium   Displaced fracture of proximal phalanx of right lesser toe(s), initial encounter for closed fracture    Diverticulosis    GERD (gastroesophageal reflux disease)    OTC meds as needed    Headache    History of colon polyps    benign    History of kidney stones    History of migraine    Hyperlipidemia    Hypertension    Insomnia    Joint pain    Nephrolithiasis    Pre-diabetes    Stroke [facility]    without residual deficits    Past Surgical History:  Procedure Laterality Date   AMPUTATION TOE Right 04/13/2022   Procedure: 28820 - AMPUTATION TOE;  Surgeon: Ashley Soulier, DPM;  Location: [facility] OR[facility];  Service: Podiatry;  Laterality: Right;   ANKLE ARTHROSCOPY Right 04/13/2022   Procedure: A-SCOPE/OCD REPAIR;  Surgeon: Ashley Soulier, DPM;  Location: [facility];  Service: Podiatry;  Laterality: Right;   ANKLE SURGERY Right 1997   ankles/screws    ARTHROSCOPIC REPAIR ACL Right 1993   BACK SURGERY  2017   titanium rods and pins in lower back   COLONOSCOPY     ENDOVASCULAR STENT GRAFT (AAA) N/A 10/21/2024   Procedure: ENDOVASCULAR STENT GRAFT (AAA);  Surgeon: Marea Selinda RAMAN, MD;  Location: [facility];  Service: Cardiovascular;  Laterality: N/A;   EPIDIDYMECTOMY Left 04/10/2017   Procedure: EPIDIDYMECTOMY;  Surgeon: Rosina Riis, MD;  Location: [facility];  Service: Urology;  Laterality: Left;   ESOPHAGOGASTRODUODENOSCOPY     LUMBAR FUSION  March 13, 2016   L4/L5 fusion   MINOR HARDWARE REMOVAL Right 04/13/2022   Procedure: REMOVAL  SCREW; DEEP;  Surgeon: Ashley Soulier, DPM;  Location: [facility];  Service: Podiatry;  Laterality: Right;   TONSILLECTOMY  1962   adenoidectomy    VASECTOMY  1973   WOUND DEBRIDEMENT Right 04/13/2022   Procedure: 29906 - SUBTALAR ARTHROSCOPY;  Surgeon: Ashley Soulier, DPM;  Location: [facility];  Service: Podiatry;  Laterality: Right;    Allergies  Allergen Reactions   Codeine Nausea And Vomiting    Family History  Problem Relation Age of Onset   Arthritis Mother    Esophageal cancer Father    Prostate cancer Neg Hx    Bladder Cancer Neg Hx    Kidney cancer Neg Hx     Social History   Tobacco Use   Smoking status: Every Day     Current packs/day: 0.50    Average packs/day: 0.5 packs/day for 50.0 years (25.0 ttl pk-yrs)    Types: Cigarettes   Smokeless tobacco: Never   Tobacco comments:    Half a pack a day, per wife.  Vaping Use   Vaping status: Never Used  Substance Use Topics   Alcohol use: No   Drug use: No     Pertinent GI related history and allergies were reviewed with the patient  Review of Systems  Reason unable to perform ROS: provided by patient's wife.  Constitutional:  Positive for fatigue. Negative for chills, diaphoresis, fever and unexpected weight change.  HENT:  Negative for trouble swallowing and voice change.   Respiratory:  Negative for shortness of breath and wheezing.   Cardiovascular:  Negative for chest pain, palpitations and leg swelling.  Gastrointestinal:  Positive for abdominal pain, nausea and vomiting. Negative for abdominal distention, anal bleeding, blood in stool, constipation and diarrhea.  Skin:  Positive for pallor. Negative for color change.  Neurological:  Positive for syncope and weakness. Negative for dizziness.  All other systems reviewed and are negative.    Medications Home Medications No current facility-administered medications on file prior to encounter.   Current Outpatient Medications on File Prior to Encounter  Medication Sig Dispense Refill   acetaminophen  (TYLENOL ) 325 MG tablet Take 650 mg by mouth every 8 (eight) hours as needed for moderate pain.     ALPRAZolam  (XANAX ) 0.25 MG tablet Take 0.25 mg by mouth at bedtime as needed for anxiety. (Patient not taking: Reported on 10/22/2024)     amLODipine  (NORVASC ) 10 MG tablet Take 10 mg by mouth daily.     aspirin  EC 81 MG tablet Take 81 mg by mouth daily.     clopidogrel  (PLAVIX ) 75 MG tablet 75 mg daily.     cyanocobalamin  (VITAMIN B12) 1000 MCG tablet Take 1,000 mcg by mouth.  Take 1,000 mcg by mouth once daily     diphenhydramine-acetaminophen  (TYLENOL  PM) 25-500 MG TABS tablet Take 1 tablet by mouth  at bedtime.     donepezil  (ARICEPT ) 10 MG tablet Take 10 mg by mouth.  TAKE 1 TABLET(10 MG) BY MOUTH AT BEDTIME     DULoxetine  (CYMBALTA ) 60 MG capsule Take 60 mg by mouth daily.     ferrous sulfate  325 (65 FE) MG tablet Take 325 mg by mouth.  Take 325 mg by mouth daily with breakfast     ibuprofen  (ADVIL ) 400 MG tablet Take 400 mg by mouth every 6 (six) hours as needed.     Multiple Vitamin (MULTIVITAMIN WITH MINERALS) TABS tablet Take 1 tablet by mouth daily.     naphazoline-pheniramine (NAPHCON-A) 0.025-0.3 % ophthalmic solution Place 1 drop into  both eyes 4 (four) times daily as needed for eye irritation.     naproxen sodium (ALEVE) 220 MG tablet Take 220 mg by mouth daily as needed.     QUEtiapine  (SEROQUEL ) 25 MG tablet Take 25 mg by mouth at bedtime.     rosuvastatin  (CRESTOR ) 20 MG tablet Take 20 mg by mouth daily.     trolamine salicylate (ASPERCREME) 10 % cream Apply 1 application topically as needed for muscle pain.     Pertinent GI related medications were reviewed with the patient  Inpatient Medications  Current Facility-Administered Medications:    0.9 %  sodium chloride  infusion, 10 mL/hr, Intravenous, Once, Arlander Charleston, MD  Current Outpatient Medications:    acetaminophen  (TYLENOL ) 325 MG tablet, Take 650 mg by mouth every 8 (eight) hours as needed for moderate pain., Disp: , Rfl:    ALPRAZolam  (XANAX ) 0.25 MG tablet, Take 0.25 mg by mouth at bedtime as needed for anxiety. (Patient not taking: Reported on 10/22/2024), Disp: , Rfl:    amLODipine  (NORVASC ) 10 MG tablet, Take 10 mg by mouth daily., Disp: , Rfl:    aspirin  EC 81 MG tablet, Take 81 mg by mouth daily., Disp: , Rfl:    clopidogrel  (PLAVIX ) 75 MG tablet, 75 mg daily., Disp: , Rfl:    cyanocobalamin  (VITAMIN B12) 1000 MCG tablet, Take 1,000 mcg by mouth.  Take 1,000 mcg by mouth once daily, Disp: , Rfl:    diphenhydramine-acetaminophen  (TYLENOL  PM) 25-500 MG TABS tablet, Take 1 tablet by mouth at bedtime., Disp: ,  Rfl:    donepezil  (ARICEPT ) 10 MG tablet, Take 10 mg by mouth.  TAKE 1 TABLET(10 MG) BY MOUTH AT BEDTIME, Disp: , Rfl:    DULoxetine  (CYMBALTA ) 60 MG capsule, Take 60 mg by mouth daily., Disp: , Rfl:    ferrous sulfate  325 (65 FE) MG tablet, Take 325 mg by mouth.  Take 325 mg by mouth daily with breakfast, Disp: , Rfl:    ibuprofen  (ADVIL ) 400 MG tablet, Take 400 mg by mouth every 6 (six) hours as needed., Disp: , Rfl:    Multiple Vitamin (MULTIVITAMIN WITH MINERALS) TABS tablet, Take 1 tablet by mouth daily., Disp: , Rfl:    naphazoline-pheniramine (NAPHCON-A) 0.025-0.3 % ophthalmic solution, Place 1 drop into both eyes 4 (four) times daily as needed for eye irritation., Disp: , Rfl:    naproxen sodium (ALEVE) 220 MG tablet, Take 220 mg by mouth daily as needed., Disp: , Rfl:    QUEtiapine  (SEROQUEL ) 25 MG tablet, Take 25 mg by mouth at bedtime., Disp: , Rfl:    rosuvastatin  (CRESTOR ) 20 MG tablet, Take 20 mg by mouth daily., Disp: , Rfl:    trolamine salicylate (ASPERCREME) 10 % cream, Apply 1 application topically as needed for muscle pain., Disp: , Rfl:   sodium chloride          Objective   Vitals:   10/24/24 0725 10/24/24 0726 10/24/24 0800 10/24/24 0845  BP: (!) 143/80  (!) 149/72 (!) 144/78  Pulse: (!) 118  (!) 111 99  Resp: 18  (!) 22 19  Temp: 98.1 F (36.7 C)   98.3 F (36.8 C)  TempSrc: Oral   Oral  SpO2: 100%   100%  Weight:  90 kg    Height:  6' 1 (1.854 m)       Physical Exam Vitals and nursing note reviewed.  Constitutional:      General: He is not in acute distress.    Appearance: He is  ill-appearing. He is not toxic-appearing or diaphoretic.  HENT:     Head: Normocephalic and atraumatic.     Nose: Nose normal.     Mouth/Throat:     Mouth: Mucous membranes are moist.     Pharynx: Oropharynx is clear.  Eyes:     General: No scleral icterus.    Extraocular Movements: Extraocular movements intact.  Cardiovascular:     Rate and Rhythm: Regular rhythm.  Tachycardia present.  Pulmonary:     Effort: Pulmonary effort is normal. No respiratory distress.     Breath sounds: Normal breath sounds. No wheezing, rhonchi or rales.  Abdominal:     General: Bowel sounds are normal. There is no distension.     Palpations: Abdomen is soft.     Tenderness: There is no abdominal tenderness. There is no guarding or rebound.  Musculoskeletal:     Cervical back: Neck supple.     Right lower leg: No edema.     Left lower leg: No edema.  Skin:    General: Skin is warm and dry.     Coloration: Skin is pale. Skin is not jaundiced.  Neurological:     Mental Status: He is alert. Mental status is at baseline.     Laboratory Data Recent Labs  Lab 10/20/24 0920 10/22/24 0614 10/24/24 0731  WBC 7.8 10.1 13.0*  HGB 10.9* 9.4* 7.8*  HCT 33.6* 29.8* 25.0*  PLT 294 219 215   Recent Labs  Lab 10/20/24 0920 10/22/24 0614 10/24/24 0731  NA 144 141 140  K 4.0 3.7 3.5  CL 105 106 106  CO2 27 24 21*  BUN 27* 21 39*  CALCIUM  9.1 8.3* 8.2*  PROT  --   --  6.3*  BILITOT  --   --  0.7  ALKPHOS  --   --  61  ALT  --   --  11  AST  --   --  19  GLUCOSE 100* 126* 143*   Recent Labs  Lab 10/24/24 0731  INR 1.2    No results for input(s): LIPASE in the last 72 hours.      Imaging Studies: CT Angio Abd/Pel W and/or Wo Contrast Result Date: 10/24/2024 EXAM: CTA ABDOMEN AND PELVIS WITHOUT AND WITH CONTRAST 10/24/2024 07:57:02 AM TECHNIQUE: CTA images of the abdomen and pelvis without and with intravenous contrast. Three-dimensional MIP/volume rendered formations were performed. Automated exposure control, iterative reconstruction, and/or weight based adjustment of the mA/kV was utilized to reduce the radiation dose to as low as reasonably achievable. COMPARISON: CTA of the abdomen and pelvis 09/01/2024. CLINICAL HISTORY: 78 year old male with a history of endograft repair of abdominal aortic aneurysm. FINDINGS: VASCULATURE: AORTA: Bifurcated abdominal  aortic endograft is patent. Evidence of endoleak at the proximal bifurcated portion of the device (series 6 images 108 through 121 and delayed series 13 image 45). The underlying native aneurysm sac has not significantly diminished since September and some images suggest it may be 1 or 2 mm larger (series 13 image 49 approximately 52 to 53 mm versus 51 mm at the same level previously; coronal image 27 of series 15 suggesting 53 mm; sagittal image 109 of series 10 suggesting 51 mm). Underlying advanced aortic atherosclerosis and chronic infrarenal abdominal aortic aneurysm which measured up to 5.3 cm diameter in September. Superimposed coil embolization of the left internal iliac artery. Advanced calcified iliac atherosclerosis bilaterally. Proximal femoral artery atherosclerosis. The major arterial structures in the abdomen and pelvis remain patent. On the  delayed images the portal venous system appears patent. . . No early arterial contrast extravasation into the bowel is identified.however, on only the delayed images there does appear to be a small volume of puddling blood or contrast within the stomach along the greater curve series 13 image 26. LIVER: The liver is unremarkable. GALLBLADDER AND BILE DUCTS: Gallbladder is unremarkable. No biliary ductal dilatation. Vicarious contrast excretion to the gallbladder. SPLEEN: The spleen is unremarkable. PANCREAS: The pancreas is unremarkable. ADRENAL GLANDS: Chronically abnormal left adrenal gland has both low density and calcified components stable since September, and reportedly no new findings have been obtained so far from a 2016 CT suggesting benign etiology (no follow up imaging recommended). Right adrenal gland demonstrates no acute abnormality. KIDNEYS, URETERS AND BLADDER: Stable polycystic renal disease which appears benign (no follow up imaging recommended). On the delayed images early renal contrast excretion appears symmetric. No stones in the kidneys or  ureters. No hydronephrosis. No perinephric or periureteral stranding. Small volume gas within the urinary bladder, suspicious for gas forming infection unless explained by recent catheterization (series 6 image 199). GI AND BOWEL: Stomach and duodenal sweep demonstrate no acute abnormality. Severe diverticulosis of the sigmoid colon in the left lower quadrant and pelvis. No active inflammation. Moderate diverticulosis of the descending colon. Nondilated bowel. Normal appendix on coronal image (series 6 image 173). No free air or free fluid identified. REPRODUCTIVE: Reproductive organs are unremarkable. PERITONEUM AND RETROPERITONEUM: No ascites or free air. No evidence of retroperitoneal hemorrhage. LUNG BASE: Severe irregularity and atherosclerosis of the visible descending thoracic aorta. Multifocal irregular mural plaque or thrombus. Caliber of the vessel is stable. Underlying calcified atherosclerosis. Normal heart size. No pericardial or pleural effusion. Mild lung base atelectasis or scarring. LYMPH NODES: No lymphadenopathy. BONES AND SOFT TISSUES: Chronic severe degeneration in the spine superimposed on previous lower lumbar decompression and fusion. Insert bones. No acute soft tissue abnormality. IMPRESSION: 1. CTA suggests slow but active bleeding into the gastric lumen along the great curve. See series 13 image 26. 2. Positive also for Endoleak at or near Endograft graft bifurcation; underlying abdominal aortic aneurysm with native aneurysm sac not significantly diminished since 09/01/2024 (up to 53 mm). 3. Underlying Severe thoracic and abdominal aortic atherosclerosis. major arterial structures remain patent. 4. Small volume intravesical gas, suspicious for gas-forming urinary infection if no recent catheterization. Electronically signed by: Helayne Hurst MD 10/24/2024 08:34 AM EST RP Workstation: [id number]    Assessment:   # Hematemesis 2/2 UGIB - suspected ulcer given heavy nsaid use and anti plt  use  # symptomatic anemia with syncopal episode  # recent endograft surgery for aortic aneurysm   # dementia  # chronic pain  # h/o CVA  Plan:  Esophagogastroduodenoscopy planned for today pending patient stability and endoscopy suite availability NPO at now.  Labs reviewed Reglan 10 mg now Protonix 40 mg iv q12 h Hold dvt ppx  Monitor H&H.  Transfusion and resuscitation as per primary team Avoid frequent lab draws to prevent lab induced anemia Supportive care and antiemetics as per primary team Maintain two sites IV access Avoid nsaids Monitor for GIB.  Esophagogastroduodenoscopy with possible biopsy, control of bleeding, polypectomy, and interventions as necessary has been discussed with the patient/patient representative. Informed consent was obtained from the patient/patient representative after explaining the indication, nature, and risks of the procedure including but not limited to death, bleeding, perforation, missed neoplasm/lesions, cardiorespiratory compromise, and reaction to medications. Opportunity for questions was given and appropriate answers were  provided. Patient/patient representative has verbalized understanding is amenable to undergoing the procedure.  Management of other medical comorbidities as per primary team  I personally performed the service.  Thank you for allowing us  to participate in this patient's care. Please don't hesitate to call if any questions or concerns arise.   Elspeth Ozell Jungling, DO [facility]  Portions of the record may have been created with voice recognition software. Occasional wrong-word or 'sound-a-like' substitutions may have occurred due to the inherent limitations of voice recognition software.  Read the chart carefully and recognize, using context, where substitutions may have occurred.

## 2024-10-24 NOTE — Anesthesia Procedure Notes (Signed)
 Procedure Name: Intubation Date/Time: 10/24/2024 10:56 AM  Performed by: Gillermo Spruce I, CRNAPre-anesthesia Checklist: Patient identified, Emergency Drugs available, Suction available and Patient being monitored Patient Re-evaluated:Patient Re-evaluated prior to induction Oxygen Delivery Method: Circle system utilized Preoxygenation: Pre-oxygenation with 100% oxygen Induction Type: IV induction Ventilation: Mask ventilation without difficulty Laryngoscope Size: 3 and 4 Grade View: Grade I Tube type: Oral Tube size: 7.5 mm Number of attempts: 1 Airway Equipment and Method: Stylet and Oral airway Placement Confirmation: ETT inserted through vocal cords under direct vision, positive ETCO2 and breath sounds checked- equal and bilateral Tube secured with: Tape Dental Injury: Teeth and Oropharynx as per pre-operative assessment

## 2024-10-24 NOTE — ED Notes (Signed)
 Wife at bedside said pt became dizzy in the bathroom before he fell. Reports him being cold and diaphoretic when she got him back to the bed.

## 2024-10-25 ENCOUNTER — Encounter: Payer: Self-pay | Admitting: Registered Nurse

## 2024-10-25 ENCOUNTER — Encounter
Admission: EM | Disposition: A | Discharge status: Skilled Nursing Facility | Payer: Self-pay | Source: Home / Self Care | Attending: Internal Medicine

## 2024-10-25 DIAGNOSIS — K922 Gastrointestinal hemorrhage, unspecified: Secondary | ICD-10-CM

## 2024-10-25 HISTORY — PX: ESOPHAGOGASTRODUODENOSCOPY: SHX5428

## 2024-10-25 LAB — COMPREHENSIVE METABOLIC PANEL WITH GFR
ALT: 11 U/L (ref 0–44)
AST: 14 U/L — ABNORMAL LOW (ref 15–41)
Albumin: 2.6 g/dL — ABNORMAL LOW (ref 3.5–5.0)
Alkaline Phosphatase: 51 U/L (ref 38–126)
Anion gap: 12 (ref 5–15)
BUN: 57 mg/dL — ABNORMAL HIGH (ref 8–23)
CO2: 20 mmol/L — ABNORMAL LOW (ref 22–32)
Calcium: 7.2 mg/dL — ABNORMAL LOW (ref 8.9–10.3)
Chloride: 111 mmol/L (ref 98–111)
Creatinine, Ser: 2.08 mg/dL — ABNORMAL HIGH (ref 0.61–1.24)
GFR, Estimated: 32 mL/min — ABNORMAL LOW (ref >60)
Glucose, Bld: 152 mg/dL — ABNORMAL HIGH (ref 70–99)
Potassium: 4.7 mmol/L (ref 3.5–5.1)
Sodium: 143 mmol/L (ref 135–145)
Total Bilirubin: 0.4 mg/dL (ref 0.0–1.2)
Total Protein: 5.6 g/dL — ABNORMAL LOW (ref 6.5–8.1)

## 2024-10-25 LAB — MAGNESIUM: Magnesium: 2.2 mg/dL (ref 1.7–2.4)

## 2024-10-25 LAB — CBC
HCT: 25.7 % — ABNORMAL LOW (ref 39.0–52.0)
Hemoglobin: 8.3 g/dL — ABNORMAL LOW (ref 13.0–17.0)
MCH: 29.5 pg (ref 26.0–34.0)
MCHC: 32.3 g/dL (ref 30.0–36.0)
MCV: 91.5 fL (ref 80.0–100.0)
Platelets: 174 K/uL (ref 150–400)
RBC: 2.81 MIL/uL — ABNORMAL LOW (ref 4.22–5.81)
RDW: 16.4 % — ABNORMAL HIGH (ref 11.5–15.5)
WBC: 16.4 K/uL — ABNORMAL HIGH (ref 4.0–10.5)
nRBC: 0 % (ref 0.0–0.2)

## 2024-10-25 LAB — PREPARE RBC (CROSSMATCH)

## 2024-10-25 LAB — URINALYSIS, COMPLETE (UACMP) WITH MICROSCOPIC
Bacteria, UA: NONE SEEN
Bilirubin Urine: NEGATIVE
Glucose, UA: NEGATIVE mg/dL
Hgb urine dipstick: NEGATIVE
Ketones, ur: NEGATIVE mg/dL
Leukocytes,Ua: NEGATIVE
Nitrite: NEGATIVE
Protein, ur: 30 mg/dL — AB
Specific Gravity, Urine: 1.033 — ABNORMAL HIGH (ref 1.005–1.030)
pH: 5 (ref 5.0–8.0)

## 2024-10-25 LAB — HEMOGLOBIN AND HEMATOCRIT, BLOOD
HCT: 23.9 % — ABNORMAL LOW (ref 39.0–52.0)
HCT: 21.9 % — ABNORMAL LOW (ref 39.0–52.0)
Hemoglobin: 7.8 g/dL — ABNORMAL LOW (ref 13.0–17.0)
Hemoglobin: 7.2 g/dL — ABNORMAL LOW (ref 13.0–17.0)

## 2024-10-25 LAB — PROTIME-INR
INR: 1.2 (ref 0.8–1.2)
Prothrombin Time: 16.1 s — ABNORMAL HIGH (ref 11.4–15.2)

## 2024-10-25 LAB — PHOSPHORUS: Phosphorus: 6.1 mg/dL — ABNORMAL HIGH (ref 2.5–4.6)

## 2024-10-25 LAB — GLUCOSE, CAPILLARY
Glucose-Capillary: 149 mg/dL — ABNORMAL HIGH (ref 70–99)
Glucose-Capillary: 139 mg/dL — ABNORMAL HIGH (ref 70–99)

## 2024-10-25 MED ORDER — MIDAZOLAM HCL (PF) 2 MG/2ML IJ SOLN
4.0000 mg | Freq: Once | INTRAMUSCULAR | Status: AC
  Administered 2024-10-25: 4 mg via INTRAVENOUS
  Filled 2024-10-25: qty 4

## 2024-10-25 MED ORDER — SODIUM CHLORIDE 0.9% IV SOLUTION: Freq: Once | INTRAVENOUS | Status: AC

## 2024-10-25 MED ORDER — LACTATED RINGERS IV SOLN: INTRAVENOUS | Status: AC

## 2024-10-25 MED ORDER — ORAL CARE MOUTH RINSE
15.0000 mL | OROMUCOSAL | Status: DC
  Administered 2024-10-25 – 2024-10-27 (×29): 15 mL via OROMUCOSAL

## 2024-10-25 MED ORDER — SODIUM CHLORIDE (PF) 0.9 % IJ SOLN
PREFILLED_SYRINGE | INTRAVENOUS | Status: DC | PRN
  Administered 2024-10-25: 60 mL
  Administered 2024-10-25: 4 mL

## 2024-10-25 MED ORDER — EPINEPHRINE 1 MG/10ML IV SOSY
PREFILLED_SYRINGE | INTRAVENOUS | Status: AC
  Filled 2024-10-25: qty 10

## 2024-10-25 MED ORDER — ORAL CARE MOUTH RINSE: 15.0000 mL | OROMUCOSAL | Status: DC | PRN

## 2024-10-25 NOTE — Progress Notes (Signed)
 PHARMACY CONSULT NOTE - FOLLOW UP  Pharmacy Consult for Electrolyte Monitoring and Replacement   Recent Labs: Potassium (mmol/L)  Date Value  10/25/2024 4.7  09/05/2014 3.7   Magnesium (mg/dL)  Date Value  88/90/7974 2.2   Calcium  (mg/dL)  Date Value  88/90/7974 7.2 (L)   Calcium , Total (mg/dL)  Date Value  90/79/7984 9.9   Albumin (g/dL)  Date Value  88/90/7974 2.6 (L)  09/05/2014 3.5   Phosphorus (mg/dL)  Date Value  88/90/7974 6.1 (H)   Sodium (mmol/L)  Date Value  10/25/2024 143  09/05/2014 140     Assessment: 78 y.o. male with medical history significant for chronic dementia, Barrett's esophagus, chronic pain, CVA, AAA s/p recent endograft 10/21/2024 who presented to ED with hematemesis. Pharmacy is asked to follow and replace electrolytes while in CCU  Goal of Therapy:  Electrolytes WNL  Plan:  ---no electrolyte replacement warranted for today ---recheck electrolytes in am  Adriana JONETTA Bolster ,PharmD Clinical Pharmacist 10/25/2024 7:15 AM

## 2024-10-25 NOTE — IPAL (Signed)
 GOALS OF CARE FAMILY CONFERENCE   Current clinical status, hospital findings and medical plan was reviewed with family.   Patient with upper GI bleed s/p multiple procedures.  I met with wife and children. They understand severity of illness and appreciative for care.  Dr Jordis from surgery and Dr Onita from GI service was able to be there in person.    Updated and notified of patients ongoing immediate critical medical problems.   Patient remains unresponsive acutely comatose    Explained to family course of therapy and the modalities   Patient with Progressive multiorgan failure with high probability of a very minimal chance of meaningful recovery despite aggressive and optimal medical therapy.   Family is appreciative of care and relate understanding that patient is severely critically ill with anticipation of passing away during this hospitalization.   They have consented and agreed to DNR/DNI  Code status   Family are satisfied with Plan of action and management. All questions answered  Additional Critical Care time 35 mins    Halina Picking, M.D.  Pulmonary & Critical Care Medicine  [facility]

## 2024-10-25 NOTE — Op Note (Signed)
 [facility]astroenterology Patient Name: Albert [name] Procedure Date: 10/25/2024 8:32 AM MRN: [id number] Account #: [id number] Date of Birth: 08/18/1946 Admit Type: Inpatient Age: 78 Room: [facility] ENDO ROOM 4 Gender: Male Note Status: Finalized Instrument Name: Endoscope [id number] Procedure:             Upper GI endoscopy Indications:           Upper GI Bleeding Providers:             Elspeth Ozell Onita ROSALEA, DO Referring MD:          [name] ORN. Lenon MD, MD (Referring MD) Medicines:             Monitored Anesthesia Care Complications:         No immediate complications. Estimated blood loss:                         Minimal. Procedure:             Pre-Anesthesia Assessment:                        - Prior to the procedure, a History and Physical was                         performed, and patient medications and allergies were                         reviewed. The patient is competent. The risks and                         benefits of the procedure and the sedation options and                         risks were discussed with the patient. All questions                         were answered and informed consent was obtained.                         Patient identification and proposed procedure were                         verified by the physician, the nurse, the anesthetist                         and the technician in the endoscopy suite. Mental                         Status Examination: alert and oriented. Airway                         Examination: normal oropharyngeal airway and neck                         mobility. Respiratory Examination: clear to                         auscultation. CV Examination: RRR, no murmurs, no S3  or S4. Prophylactic Antibiotics: The patient does not                         require prophylactic antibiotics. Prior                         Anticoagulants: The patient has taken Plavix                           (clopidogrel ), last dose was 2 days prior to                         procedure. ASA Grade Assessment: IV - A patient with                         severe systemic disease that is a constant threat to                         life. After reviewing the risks and benefits, the                         patient was deemed in satisfactory condition to                         undergo the procedure. The anesthesia plan was to use                         general anesthesia. Immediately prior to                         administration of medications, the patient was                         re-assessed for adequacy to receive sedatives. The                         heart rate, respiratory rate, oxygen saturations,                         blood pressure, adequacy of pulmonary ventilation, and                         response to care were monitored throughout the                         procedure. The physical status of the patient was                         re-assessed after the procedure.                        After obtaining informed consent, the endoscope was                         passed under direct vision. Throughout the procedure,                         the patient's blood pressure, pulse, and oxygen  saturations were monitored continuously. The Endoscope                         was introduced through the mouth, and advanced to the                         second part of duodenum. The upper GI endoscopy was                         accomplished without difficulty. The patient tolerated                         the procedure well. Findings:      Hematin (altered blood/coffee-ground-like material) was found in the       entire duodenum. No signs of active source of bleeding in duodenum,       Estimated blood loss: none.      Hematin (altered blood/coffee-ground-like material) was found in the       entire esophagus. No source of active bleeding in the esophagus.        Estimated blood loss: none.      Hematin (altered blood/coffee-ground-like material) was found in the       entire examined stomach. Estimated blood loss: none.      Clotted blood was found in the gastric fundus and in the gastric body.       Area was successfully injected with 3 mL of a 0.1 mg/mL solution of       epinephrine for hemostasis to prevent bleeding with clot removal. A roth       net and Snare were used to help remove clot from the fundus/body.       Eventually, all clot was able to be removed and relocated to the antrum       and beyond. A small area of pale mucosa was appreciated consistent with       recent IR Embolization. There was no active source of       bleeding/extravasation appreciated. No signs of ulceration. Impression:            - Blood in the entire examined duodenum.                        - Hematin (altered blood/coffee-ground-like material)                         in the esophagus.                        - Hematin (altered blood/coffee-ground-like material)                         in the entire stomach.                        - Clotted blood in the gastric fundus and in the                         gastric body. Injected.                        - No specimens collected. Recommendation:        - Return patient  to ICU for ongoing care.                        - NPO.                        - Continue present medications.                        - ppi bid for 8 weeks                        - Extensive discussion held with family by myself,                         PCCM, and general surgery.                        Discussed but not limited to findings, potential for                         rebleed, further treatment.                        Family in agreement to change code status to DNR                        Family in agreement to avoid surgery as the patient                         would most likely do poorly/not recover.                        Will continue to  monitor for signs of rebleeding and                         resuscitate as necessary.                        Could consider repeat EGD if rebleed occurs pending                         patient status.                        Patient status is tenuous/guarded at this time and he                         is critically ill.                        Family voiced understanding. Procedure Code(s):     --- Professional ---                        [phone], Esophagogastroduodenoscopy, flexible,                         transoral; diagnostic, including collection of                         specimen(s) by brushing or washing, when performed                         (  separate procedure) Diagnosis Code(s):     --- Professional ---                        K92.2, Gastrointestinal hemorrhage, unspecified                        K22.89, Other specified disease of esophagus CPT copyright 2022 American Medical Association. All rights reserved. The codes documented in this report are preliminary and upon coder review may  be revised to meet current compliance requirements. Attending Participation:      I personally performed the entire procedure. Elspeth Jungling, DO Elspeth Ozell Jungling DO, DO 10/25/2024 11:00:12 AM This report has been signed electronically. Number of Addenda: 0 Note Initiated On: 10/25/2024 8:32 AM Estimated Blood Loss:  Estimated blood loss was minimal.      [facility]

## 2024-10-25 NOTE — Progress Notes (Signed)
 Bedside EGD w/ Dr. Onita and 2 endoscopy nurses.

## 2024-10-25 NOTE — Progress Notes (Signed)
      Daily Progress Note  Yesterday's events noted.  Will let Dr. Marea know pt is back in hospital.  Again, no complications from recent EVAR.    Redell Door, MD, FACS, FSVS Covering for [facility]: [phone]  10/25/2024, 8:33 AM

## 2024-10-25 NOTE — Progress Notes (Signed)
 Referring Provider(s): Dr. Elspeth Jungling  Supervising Physician: Karalee Beat  Patient Status:  [facility] - In-pt  Chief Complaint: Gastric Bleed s/p Embolization   Brief History:  78 year old male with a history of dementia, CVA, HTN, AAA s/p endovascular stent grafting on 11/5 who presented tot he ED on 11/8 after a reported fall at home and episode of hematemesis. ED workup significant for drop in Hgb requiring 1unit of PRCB. CTA suggestive of slow, but active bleed. EGD completed, but unable to localize the source of bleeding. IR consulted and patient underwent angiogram with coil embolization of the mid-segment of the gastroepiploic artery along the greater curvature of the stomach.  Subjective:  GI in room and completing bedside scope. On return to room, patient resting in bed. Remains intubated and sedated.  Allergies: Codeine  Medications: Prior to Admission medications   Medication Sig Start Date End Date Taking? Authorizing Provider  acetaminophen  (TYLENOL ) 325 MG tablet Take 650 mg by mouth every 8 (eight) hours as needed for moderate pain.   Yes [provider]  amLODipine  (NORVASC ) 10 MG tablet Take 10 mg by mouth daily. 08/27/18  Yes [provider]  aspirin  EC 81 MG tablet Take 81 mg by mouth daily.   Yes [provider]  clopidogrel  (PLAVIX ) 75 MG tablet 75 mg daily. 07/29/19  Yes [provider]  cyanocobalamin  (VITAMIN B12) 1000 MCG tablet Take 1,000 mcg by mouth.  Take 1,000 mcg by mouth once daily   Yes [provider]  diphenhydramine-acetaminophen  (TYLENOL  PM) 25-500 MG TABS tablet Take 1 tablet by mouth at bedtime.   Yes [provider]  donepezil  (ARICEPT ) 10 MG tablet Take 10 mg by mouth.  TAKE 1 TABLET(10 MG) BY MOUTH AT BEDTIME 04/23/24  Yes [provider]  DULoxetine  (CYMBALTA ) 60 MG capsule Take 60 mg by mouth daily. 02/25/19 10/25/24 Yes [provider]  ferrous sulfate  325 (65 FE) MG  tablet Take 325 mg by mouth.  Take 325 mg by mouth daily with breakfast   Yes [provider]  ibuprofen  (ADVIL ) 400 MG tablet Take 400 mg by mouth every 6 (six) hours as needed.   Yes [provider]  Multiple Vitamin (MULTIVITAMIN WITH MINERALS) TABS tablet Take 1 tablet by mouth daily.   Yes [provider]  naproxen sodium (ALEVE) 220 MG tablet Take 220 mg by mouth daily as needed.   Yes [provider]  QUEtiapine  (SEROQUEL ) 25 MG tablet Take 25 mg by mouth at bedtime. 06/15/24  Yes [provider]  rosuvastatin  (CRESTOR ) 20 MG tablet Take 20 mg by mouth daily. 07/29/19  Yes [provider]  ALPRAZolam  (XANAX ) 0.25 MG tablet Take 0.25 mg by mouth at bedtime as needed for anxiety. Patient not taking: Reported on 10/22/2024 05/01/19   [provider]  naphazoline-pheniramine (NAPHCON-A) 0.025-0.3 % ophthalmic solution Place 1 drop into both eyes 4 (four) times daily as needed for eye irritation. Patient not taking: Reported on 10/25/2024    [provider]  trolamine salicylate (ASPERCREME) 10 % cream Apply 1 application topically as needed for muscle pain. Patient not taking: Reported on 10/25/2024    [provider]    Vital Signs: BP 129/77 (BP Location: Left Arm)   Pulse 96   Temp 98.8 F (37.1 C) (Axillary)   Resp 17   Ht 6' 1 (1.854 m)   Wt 190 lb 14.7 oz (86.6 kg)   SpO2 92%   BMI 25.19 kg/m  Physical Exam Vitals reviewed.  HENT:     Mouth/Throat:     Comments: Intubated Cardiovascular:     Rate and Rhythm: Normal rate.     Pulses: Normal pulses.     Comments: Right groin site with overlying bandage. No evidence of bruising, swelling, or active bleeding at puncture site Pulmonary:     Effort: Pulmonary effort is normal.  Abdominal:     General: Abdomen is flat.     Palpations: Abdomen is soft.     Tenderness: There is no abdominal tenderness.  Skin:    General: Skin is warm and dry.   Neurological:     Comments: Comatose     Labs:  CBC: Recent Labs    10/24/24 0731 10/24/24 1217 10/24/24 2115 10/25/24 0309 10/25/24 1034  WBC 13.0* 11.0* 13.9* 16.4*  --   HGB 7.8* 6.9* 8.5* 8.3* 7.8*  HCT 25.0* 21.7* 26.0* 25.7* 23.9*  PLT 215 149* 152 174  --     COAGS: Recent Labs    06/23/24 1545 10/24/24 0731 10/25/24 0309  INR 1.1 1.2 1.2  APTT 31 33  --     BMP: Recent Labs    10/20/24 0920 10/22/24 0614 10/24/24 0731 10/25/24 0309  NA 144 141 140 143  K 4.0 3.7 3.5 4.7  CL 105 106 106 111  CO2 27 24 21* 20*  GLUCOSE 100* 126* 143* 152*  BUN 27* 21 39* 57*  CALCIUM  9.1 8.3* 8.2* 7.2*  CREATININE 1.02 1.11 1.14 2.08*  GFRNONAA >60 >60 >60 32*    LIVER FUNCTION TESTS: Recent Labs    06/22/24 1603 06/23/24 1545 10/24/24 0731 10/25/24 0309  BILITOT 0.5 0.4 0.7 0.4  AST 23 21 19  14*  ALT 26 23 11 11   ALKPHOS 66 78 61 51  PROT 5.9* 6.5 6.3* 5.6*  ALBUMIN 3.4* 3.8 3.0* 2.6*    Assessment and Plan:  GI Bleed s/p recent endovascular stent grafting of AAA on 11/5 Albert [name] is a 78 y.o. male with a history of dementia, HTN, CVA, and AAA s/p recent endovascular stent grafting on 11/5 who presented to the ED this morning after a witnessed fall and episode of hematemesis. Patient found to have active GI bleed that was unable to be controlled by GI during EGD. IR subsequently consulted and patient underwent angiogram with successful coil embolization of the mid segment of the gastroepiploic artery.   -Per GI patient underwent bedside scope this morning revealing no evidence of active bleeding. Previous clot was able to be removed and no evidence of underlying lesion or continued bleeding -VSS -H/H 7.8/23.9 < 8.3/25.7 earlier this morning  -Groin site healing well -Per chart, family has decided to move forward with DNR/DNI -IR will sign off  Please reach out to IR if patient status changes/family elects for any additional interventions  that we can be of service with.   Thank you for allowing our service to participate in Albert [name] 's care.   Electronically Signed: Minette Manders M Derrall Hicks, PA-C 10/25/2024, 2:17 PM    I spent a total of 15 Minutes at the the patient's bedside AND on the patient's hospital floor or unit, greater than 50% of which was counseling/coordinating care for gastric bleeding s/p embolization.

## 2024-10-25 NOTE — Care Plan (Signed)
 Brief GI Care Plan Note  Pt to have repeat EGD today.  Hgb currently stable post transfusion, as are vital signs.  I discussed EGD with his wife/poa again who agreed to the procedure.  Esophagogastroduodenoscopy with possible biopsy, control of bleeding, polypectomy, and interventions as necessary has been discussed with the patient/patient representative. Informed consent was obtained from the patient/patient representative after explaining the indication, nature, and risks of the procedure including but not limited to death, bleeding, perforation, missed neoplasm/lesions, cardiorespiratory compromise, and reaction to medications. Opportunity for questions was given and appropriate answers were provided. Patient/patient representative has verbalized understanding is amenable to undergoing the procedure.  Elspeth EMERSON Jungling, DO [facility] Gastroenterology

## 2024-10-25 NOTE — Consult Note (Signed)
 Patient ID: Albert Rout., male   DOB: Jun 19, 1946, 78 y.o.   MRN: [id number]  HPI Albert Mccamy. is a 78 y.o. male with medical history significant for chronic dementia,( Patient was not aware of year/month or current president on prior preop note a few weeks ago)  Barrett's esophagus, chronic pain, history of stroke, abdominal aortic aneurysm s/p recent endograft 10/21/2024 was on Plavix  NSAID, and ASA.  Albert [name] presented to ED with hematemesis .  Patient is not able to provide meaningful history and history is taken from chart and family members Albert [name] has had two EGD and endovascular embolization yesterday, there is clot on fundus, I came thile Dr Onita was performing his EGD and personally reviewed images, I also reviewed pers CT showing blush within the stomach  HPI  Past Medical History:  Diagnosis Date   Anemia    Anxiety    a.) on BZO (alproazolam) PRN   Arthritis of both knees    Back pain    Barrett esophagus    Chronic back pain    Dementia (HCC)    arising in the senium and presenium   Displaced fracture of proximal phalanx of right lesser toe(s), initial encounter for closed fracture    Diverticulosis    GERD (gastroesophageal reflux disease)    OTC meds as needed   Headache    History of colon polyps    benign    History of kidney stones    History of migraine    Hyperlipidemia    Hypertension    Insomnia    Joint pain    Nephrolithiasis    Pre-diabetes    Stroke [facility])    without residual deficits    Past Surgical History:  Procedure Laterality Date   AMPUTATION TOE Right 04/13/2022   Procedure: 28820 - AMPUTATION TOE;  Surgeon: Ashley Soulier, DPM;  Location: [facility] ORS;  Service: Podiatry;  Laterality: Right;   ANKLE ARTHROSCOPY Right 04/13/2022   Procedure: A-SCOPE/OCD REPAIR;  Surgeon: Ashley Soulier, DPM;  Location: [facility] ORS;  Service: Podiatry;  Laterality: Right;   ANKLE SURGERY Right 1997   ankles/screws    ARTHROSCOPIC REPAIR ACL Right 1993   BACK  SURGERY  2017   titanium rods and pins in lower back   COLONOSCOPY     ENDOVASCULAR STENT GRAFT (AAA) N/A 10/21/2024   Procedure: ENDOVASCULAR STENT GRAFT (AAA);  Surgeon: Marea Selinda RAMAN, MD;  Location: [facility] INVASIVE CV LAB;  Service: Cardiovascular;  Laterality: N/A;   EPIDIDYMECTOMY Left 04/10/2017   Procedure: EPIDIDYMECTOMY;  Surgeon: Rosina Riis, MD;  Location: [facility] ORS;  Service: Urology;  Laterality: Left;   ESOPHAGOGASTRODUODENOSCOPY     IR EMBO ART  VEN HEMORR LYMPH EXTRAV  INC GUIDE ROADMAPPING  10/24/2024   LUMBAR FUSION  March 13, 2016   L4/L5 fusion   MINOR HARDWARE REMOVAL Right 04/13/2022   Procedure: REMOVAL SCREW; DEEP;  Surgeon: Ashley Soulier, DPM;  Location: [facility] ORS;  Service: Podiatry;  Laterality: Right;   TONSILLECTOMY  1962   adenoidectomy    VASECTOMY  1973   WOUND DEBRIDEMENT Right 04/13/2022   Procedure: 29906 - SUBTALAR ARTHROSCOPY;  Surgeon: Ashley Soulier, DPM;  Location: [facility] ORS;  Service: Podiatry;  Laterality: Right;    Family History  Problem Relation Age of Onset   Arthritis Mother    Esophageal cancer Father    Prostate cancer Neg Hx    Bladder Cancer Neg Hx    Kidney cancer Neg Hx  Social History Social History   Tobacco Use   Smoking status: Every Day    Current packs/day: 0.50    Average packs/day: 0.5 packs/day for 50.0 years (25.0 ttl pk-yrs)    Types: Cigarettes   Smokeless tobacco: Never   Tobacco comments:    Half a pack a day, per wife.  Vaping Use   Vaping status: Never Used  Substance Use Topics   Alcohol use: No   Drug use: No    Allergies  Allergen Reactions   Codeine Nausea And Vomiting    Current Facility-Administered Medications  Medication Dose Route Frequency Provider Last Rate Last Admin   acetaminophen  (TYLENOL ) tablet 650 mg  650 mg Oral Q6H PRN Paudel, Nena, MD       Or   acetaminophen  (TYLENOL ) suppository 650 mg  650 mg Rectal Q6H PRN Roann Nena, MD       Chlorhexidine  Gluconate Cloth 2 % PADS  6 each  6 each Topical Daily Aleskerov, Fuad, MD   6 each at 10/25/24 0847   dexmedetomidine (PRECEDEX) 200 MCG/50ML (4 mcg/mL) infusion  0-1.2 mcg/kg/hr Intravenous Titrated Aleskerov, Fuad, MD 9 mL/hr at 10/25/24 1436 0.4 mcg/kg/hr at 10/25/24 1436   fentaNYL  (SUBLIMAZE ) injection 50 mcg  50 mcg Intravenous Once Tukov-Yual, Magdalene S, NP       fentaNYL  in NS (78mcg/ml) infusion-PREMIX  0-400 mcg/hr Intravenous Continuous Aleskerov, Fuad, MD 12.5 mL/hr at 10/25/24 0757 125 mcg/hr at 10/25/24 0757   HYDROmorphone  (DILAUDID ) injection 0.5-1 mg  0.5-1 mg Intravenous Q2H PRN Paudel, Keshab, MD   1 mg at 10/25/24 0210   Influenza vac split trivalent PF (FLUZONE HIGH-DOSE) injection 0.5 mL  0.5 mL Intramuscular Prior to discharge Aleskerov, Fuad, MD       ketamine (KETALAR) adult IV infusion 1000mg /151mL (10 mg/mL-Premix)  0.5 mg/kg/hr Intravenous Continuous Aleskerov, Fuad, MD   Stopped at 10/25/24 1435   lactated ringers  infusion   Intravenous Continuous Bousman, Karlie, PA-C 40 mL/hr at 10/25/24 1439 New Bag at 10/25/24 1439   midazolam  PF (VERSED ) injection 2 mg  2 mg Intravenous Q1H PRN Tukov-Yual, Magdalene S, NP   2 mg at 10/25/24 0847   ondansetron  (ZOFRAN ) tablet 4 mg  4 mg Oral Q6H PRN Paudel, Keshab, MD       Or   ondansetron  (ZOFRAN ) injection 4 mg  4 mg Intravenous Q6H PRN Paudel, Keshab, MD   4 mg at 10/24/24 1105   Oral care mouth rinse  15 mL Mouth Rinse Q2H Aleskerov, Fuad, MD   15 mL at 10/25/24 1438   Oral care mouth rinse  15 mL Mouth Rinse PRN Parris Manna, MD       oxyCODONE  (Oxy IR/ROXICODONE ) immediate release tablet 5 mg  5 mg Oral Q4H PRN Paudel, Keshab, MD       pantoprazole (PROTONIX) injection 40 mg  40 mg Intravenous Q12H Aleskerov, Fuad, MD   40 mg at 10/25/24 0847   polyethylene glycol (MIRALAX / GLYCOLAX) packet 17 g  17 g Oral Daily PRN Roann Nena, MD         Review of Systems Full ROS  was asked and was negative except for the information on  the HPI  Physical Exam Blood pressure 129/77, pulse 96, temperature 98.8 F (37.1 C), temperature source Axillary, resp. rate 17, height 6' 1 (1.854 m), weight 86.6 kg, SpO2 92%. CONSTITUTIONAL: Albert [name] is intubated, acutely ill on pressors . GI: The abdomen is  soft, nontender, and nondistended. There are no  palpable masses. There is no hepatosplenomegaly.  GU: Rectal deferred.   NEUROLOGIC: Albert [name] is sedated and somnolent does nto seem to have focal neurological deficits   Data Reviewed I have personally reviewed the patient's imaging, laboratory findings and medical records.    Assessment/Plan 78 year old old demented male with multiple comorbidities including coronary artery disease , Albert [name] now presents with recurrent gastric bleeds has not been appropriately controlled despite embolization on first endoscopy.  Today endoscopy seem to have better results with injection of epinephrine and no visible vessel and no active bleeding.  Albert [name] is definitely critically ill.  We had a multidisciplinary conference including Dr. Parris from pulmonary critical care and Dr. Onita from GI Albert [name] had an extensive discussion with the family.  I think doing at subtotal gastrectomy on this debilitated male with multiorgan failure is likely futile and more importantly will add pain and suffering.  Chances of recovery are very minimal.  Very unfortunate situation. Family has decided on DNR and DNI and want to think about different options but they are clear that they do not want any major massive surgical intervention that we will further decrease his quality of life. We will remain available for now I personally spent a total of 75 minutes in the care of the patient today including performing a medically appropriate exam/evaluation, counseling and educating, placing orders, referring and communicating with other health care professionals, documenting clinical information in the EHR, independently interpreting and reviewing  images studies and coordinating care.    Laneta Luna, MD FACS General Surgeon 10/25/2024, 2:41 PM

## 2024-10-25 NOTE — Plan of Care (Signed)
 This patient remains on [facility] as of time of writing. The patient is orally intubated and mechanically ventilated. The patient is receiving active infusions of Fentanyl , Ketamine, and LR. The patient also has a RIJ CVC, PIVs X 2, and a Foley catheter. The patient will arouse to light stimulation on his current infusions/rates but has not been able to follow commands; he does however, move all his extremities spontaneously and purposefully. Plan for SAT this AM.   Problem: Education: Goal: Knowledge of General Education information will improve Description: Including pain rating scale, medication(s)/side effects and non-pharmacologic comfort measures Outcome: Progressing   Problem: Health Behavior/Discharge Planning: Goal: Ability to manage health-related needs will improve Outcome: Progressing   Problem: Clinical Measurements: Goal: Ability to maintain clinical measurements within normal limits will improve Outcome: Progressing Goal: Will remain free from infection Outcome: Progressing Goal: Diagnostic test results will improve Outcome: Progressing Goal: Respiratory complications will improve Outcome: Progressing Goal: Cardiovascular complication will be avoided Outcome: Progressing   Problem: Activity: Goal: Risk for activity intolerance will decrease Outcome: Progressing   Problem: Nutrition: Goal: Adequate nutrition will be maintained Outcome: Progressing   Problem: Coping: Goal: Level of anxiety will decrease Outcome: Progressing   Problem: Elimination: Goal: Will not experience complications related to bowel motility Outcome: Progressing Goal: Will not experience complications related to urinary retention Outcome: Progressing   Problem: Pain Managment: Goal: General experience of comfort will improve and/or be controlled Outcome: Progressing   Problem: Safety: Goal: Ability to remain free from injury will improve Outcome: Progressing   Problem: Skin  Integrity: Goal: Risk for impaired skin integrity will decrease Outcome: Progressing   Problem: Education: Goal: Understanding of CV disease, CV risk reduction, and recovery process will improve Outcome: Progressing Goal: Individualized Educational Video(s) Outcome: Progressing   Problem: Activity: Goal: Ability to return to baseline activity level will improve Outcome: Progressing   Problem: Cardiovascular: Goal: Ability to achieve and maintain adequate cardiovascular perfusion will improve Outcome: Progressing Goal: Vascular access site(s) Level 0-1 will be maintained Outcome: Progressing   Problem: Health Behavior/Discharge Planning: Goal: Ability to safely manage health-related needs after discharge will improve Outcome: Progressing

## 2024-10-25 NOTE — Progress Notes (Signed)
 CRITICAL CARE     Name: Albert [name] MRN: [id number] DOB: 1946/07/02     LOS: 1   SUBJECTIVE FINDINGS & SIGNIFICANT EVENTS    History of Presenting Illness:  This is a 78 year old male with a history of dementia, Barrett's esophagus chronic pain syndrome, history of CVA, abdominal aortic aneurysm status post endograft few days ago on October 21, 2024 who came in today with hematemesis.  Patient's wife reported syncopal episode and feelings of malaise subsequently started having melanotic stools and emesis with blood.  Patient was apparently on antiplatelet therapy as well as NSAIDs at home.  He did have CTA performed which showed active extravasation of gastric lumen.  His blood work showed a decrement in hemoglobin from 10.9-7.8. He did have order for 1 unit blood transfusion which is in process of being infused at this moment.  Vascular surgery evaluated patient and did not feel that bleeding was related to prior surgery.  GI evaluation was performed and gastroenterologist attempted EGD with findings of large clotted blood in stomach.  He was unable to finish procedure and requested patient to be transferred to intensive care unit.  Further interventional radiology was contacted for possible embolization and will attempt to evaluate patient today.  Patient received IV Protonix.  Patient is on resuscitative IV fluids.  He is not currently on vasopressor support but he did receive 1 dose of Neo-Synephrine earlier today due to transient hypotension.  ICU admission is requested due to upper GI bleeding.  10/25/24- patient s/p EGD with improvement. At this moment patient is not bleeding per GI doctor.  He is severely anemic at high risk of re-bleeding.  He had IR embolization and surgery evaluation.  Family meeting today , no  surgery if he re-bleeds.  Patient remains critically ill  Lines/tubes : Airway 7.5 mm (Active)    Microbiology/Sepsis markers: Results for orders placed or performed during the hospital encounter of 10/24/24  MRSA Next Gen by PCR, Nasal     Status: None   Collection Time: 10/24/24  1:10 PM   Specimen: Nasal Mucosa; Nasal Swab  Result Value Ref Range Status   MRSA by PCR Next Gen NOT DETECTED NOT DETECTED Final    Comment: (NOTE) The GeneXpert MRSA Assay (FDA approved for NASAL specimens only), is one component of a comprehensive MRSA colonization surveillance program. It is not intended to diagnose MRSA infection nor to guide or monitor treatment for MRSA infections. Test performance is not FDA approved in patients less than 13 years old. Performed at [facility], [address] [place], [place] [address]     Anti-infectives:  Anti-infectives (From admission, onward)    None        Consults: Treatment Team:  Marea Selinda RAMAN, MD GASTROENTEROLOGY VASCULAR SURGERY  CRITICAL CARE  INTERVENTIONAL RADIOLOGY   PAST MEDICAL HISTORY   Past Medical History:  Diagnosis Date   Anemia    Anxiety    a.) on BZO (alproazolam) PRN   Arthritis of both knees    Back pain    Barrett esophagus    Chronic back pain    Dementia (HCC)    arising in the senium and presenium   Displaced fracture of proximal phalanx of right lesser toe(s), initial encounter for closed fracture    Diverticulosis    GERD (gastroesophageal reflux disease)    OTC meds as needed   Headache    History of colon polyps    benign  History of kidney stones    History of migraine    Hyperlipidemia    Hypertension    Insomnia    Joint pain    Nephrolithiasis    Pre-diabetes    Stroke [facility])    without residual deficits     SURGICAL HISTORY   Past Surgical History:  Procedure Laterality Date   AMPUTATION TOE Right 04/13/2022   Procedure: 28820 - AMPUTATION TOE;  Surgeon: Ashley Soulier, DPM;  Location: [facility] ORS;  Service: Podiatry;  Laterality: Right;   ANKLE ARTHROSCOPY Right 04/13/2022   Procedure: A-SCOPE/OCD REPAIR;  Surgeon: Ashley Soulier, DPM;  Location: [facility] ORS;  Service: Podiatry;  Laterality: Right;   ANKLE SURGERY Right 1997   ankles/screws    ARTHROSCOPIC REPAIR ACL Right 1993   BACK SURGERY  2017   titanium rods and pins in lower back   COLONOSCOPY     ENDOVASCULAR STENT GRAFT (AAA) N/A 10/21/2024   Procedure: ENDOVASCULAR STENT GRAFT (AAA);  Surgeon: Marea Selinda RAMAN, MD;  Location: [facility] INVASIVE CV LAB;  Service: Cardiovascular;  Laterality: N/A;   EPIDIDYMECTOMY Left 04/10/2017   Procedure: EPIDIDYMECTOMY;  Surgeon: Rosina Riis, MD;  Location: [facility] ORS;  Service: Urology;  Laterality: Left;   ESOPHAGOGASTRODUODENOSCOPY     IR EMBO ART  VEN HEMORR LYMPH EXTRAV  INC GUIDE ROADMAPPING  10/24/2024   LUMBAR FUSION  March 13, 2016   L4/L5 fusion   MINOR HARDWARE REMOVAL Right 04/13/2022   Procedure: REMOVAL SCREW; DEEP;  Surgeon: Ashley Soulier, DPM;  Location: [facility] ORS;  Service: Podiatry;  Laterality: Right;   TONSILLECTOMY  1962   adenoidectomy    VASECTOMY  1973   WOUND DEBRIDEMENT Right 04/13/2022   Procedure: 29906 - SUBTALAR ARTHROSCOPY;  Surgeon: Ashley Soulier, DPM;  Location: [facility] ORS;  Service: Podiatry;  Laterality: Right;     FAMILY HISTORY   Family History  Problem Relation Age of Onset   Arthritis Mother    Esophageal cancer Father    Prostate cancer Neg Hx    Bladder Cancer Neg Hx    Kidney cancer Neg Hx      SOCIAL HISTORY   Social History   Tobacco Use   Smoking status: Every Day    Current packs/day: 0.50    Average packs/day: 0.5 packs/day for 50.0 years (25.0 ttl pk-yrs)    Types: Cigarettes   Smokeless tobacco: Never   Tobacco comments:    Half a pack a day, per wife.  Vaping Use   Vaping status: Never Used  Substance Use Topics   Alcohol use: No   Drug use: No     MEDICATIONS   Current  Medication:  Current Facility-Administered Medications:    acetaminophen  (TYLENOL ) tablet 650 mg, 650 mg, Oral, Q6H PRN **OR** acetaminophen  (TYLENOL ) suppository 650 mg, 650 mg, Rectal, Q6H PRN, Paudel, Keshab, MD   Chlorhexidine  Gluconate Cloth 2 % PADS 6 each, 6 each, Topical, Daily, Cherrie Franca, MD, 6 each at 10/25/24 0847   dexmedetomidine (PRECEDEX) 200 MCG/50ML (4 mcg/mL) infusion, 0-1.2 mcg/kg/hr, Intravenous, Titrated, Gordon Carlson, MD, Held at 10/24/24 1740   fentaNYL  (SUBLIMAZE ) injection 50 mcg, 50 mcg, Intravenous, Once, Tukov-Yual, Magdalene S, NP   fentaNYL  in NS (53mcg/ml) infusion-PREMIX, 0-400 mcg/hr, Intravenous, Continuous, Austynn Pridmore, MD, Last Rate: 12.5 mL/hr at 10/25/24 0757, 125 mcg/hr at 10/25/24 0757   HYDROmorphone  (DILAUDID ) injection 0.5-1 mg, 0.5-1 mg, Intravenous, Q2H PRN, Paudel, Keshab, MD, 1 mg at 10/25/24 0210   Influenza vac split trivalent PF (  FLUZONE HIGH-DOSE) injection 0.5 mL, 0.5 mL, Intramuscular, Prior to discharge, Camryn Lampson, MD   ketamine (KETALAR) adult IV infusion 1000mg /184mL (10 mg/mL-Premix), 0.5 mg/kg/hr, Intravenous, Continuous, Kariel Skillman, MD, Last Rate: 4.5 mL/hr at 10/25/24 0700, 0.5 mg/kg/hr at 10/25/24 0700   lactated ringers  infusion, , Intravenous, Continuous, Cydney Alvarenga, MD, Last Rate: 40 mL/hr at 10/25/24 0700, Infusion Verify at 10/25/24 0700   midazolam  PF (VERSED ) injection 2 mg, 2 mg, Intravenous, Q1H PRN, Tukov-Yual, Magdalene S, NP, 2 mg at 10/25/24 0847   ondansetron  (ZOFRAN ) tablet 4 mg, 4 mg, Oral, Q6H PRN **OR** ondansetron  (ZOFRAN ) injection 4 mg, 4 mg, Intravenous, Q6H PRN, Paudel, Keshab, MD, 4 mg at 10/24/24 1105   Oral care mouth rinse, 15 mL, Mouth Rinse, Q2H, Markeita Alicia, MD, 15 mL at 10/25/24 1032   Oral care mouth rinse, 15 mL, Mouth Rinse, PRN, Ralyn Stlaurent, MD   oxyCODONE  (Oxy IR/ROXICODONE ) immediate release tablet 5 mg, 5 mg, Oral, Q4H PRN, Paudel, Keshab, MD    pantoprazole (PROTONIX) injection 40 mg, 40 mg, Intravenous, Q12H, Dailyn Kempner, MD, 40 mg at 10/25/24 0847   polyethylene glycol (MIRALAX / GLYCOLAX) packet 17 g, 17 g, Oral, Daily PRN, Paudel, Keshab, MD    ALLERGIES   Codeine    REVIEW OF SYSTEMS     Unable to obtain review of systems due to unresponsive state with endotracheal intubation on ventilator.  PHYSICAL EXAMINATION   Vital Signs: Temp:  [98.3 F (36.8 C)-99.3 F (37.4 C)] 98.8 F (37.1 C) (11/09 0400) Pulse Rate:  [76-113] 96 (11/09 0800) Resp:  [12-22] 17 (11/09 0800) BP: (74-163)/(55-103) 129/77 (11/09 0800) SpO2:  [92 %-100 %] 92 % (11/09 0800) FiO2 (%):  [30 %-60 %] 30 % (11/09 0800) Weight:  [86.6 kg] 86.6 kg (11/09 0404)  GENERAL: Age-appropriate sedated on ventilator HEAD: Normocephalic, atraumatic.  EYES: Pupils equal, round, reactive to light.  No scleral icterus.  MOUTH: Moist mucosal membrane. NECK: Supple. No thyromegaly. No nodules. No JVD.  PULMONARY: Mechanical ventilation breath sounds bilaterally CARDIOVASCULAR: S1 and S2. Regular rate and rhythm. No murmurs, rubs, or gallops.  GASTROINTESTINAL: Soft, nontender, non-distended. No masses. Positive bowel sounds. No hepatosplenomegaly.  MUSCULOSKELETAL: No swelling, clubbing, or edema.  NEUROLOGIC: GCS 4 T SKIN:intact,warm,dry   PERTINENT DATA     Infusions:  dexmedetomidine (PRECEDEX) IV infusion Stopped (10/24/24 1740)   fentaNYL  infusion INTRAVENOUS 125 mcg/hr (10/25/24 0757)   ketamine (KETALAR) adult infusion 0.5 mg/kg/hr (10/25/24 0700)   lactated ringers  40 mL/hr at 10/25/24 0700   Scheduled Medications:  Chlorhexidine  Gluconate Cloth  6 each Topical Daily   fentaNYL  (SUBLIMAZE ) injection  50 mcg Intravenous Once   mouth rinse  15 mL Mouth Rinse Q2H   pantoprazole (PROTONIX) IV  40 mg Intravenous Q12H   PRN Medications: acetaminophen  **OR** acetaminophen , HYDROmorphone  (DILAUDID ) injection, Influenza vac split trivalent  PF, midazolam  PF, ondansetron  **OR** ondansetron  (ZOFRAN ) IV, mouth rinse, oxyCODONE , polyethylene glycol Hemodynamic parameters:   Intake/Output: 11/08 0701 - 11/09 0700 In: 4168.3 [I.V.:2932.2; Blood:932; IV Piggyback:304.2] Out: 1445 [Urine:995; Blood:450]  Ventilator  Settings: Vent Mode: PRVC FiO2 (%):  [30 %-60 %] 30 % Set Rate:  [16 bmp] 16 bmp Vt Set:  [450 mL-500 mL] 500 mL PEEP:  [5 cmH20] 5 cmH20 Plateau Pressure:  [13 cmH20-20 cmH20] 13 cmH20   LAB RESULTS:  Basic Metabolic Panel: Recent Labs  Lab 10/20/24 0920 10/22/24 0614 10/24/24 0731 10/25/24 0309  NA 144 141 140 143  K 4.0 3.7 3.5 4.7  CL  105 106 106 111  CO2 27 24 21* 20*  GLUCOSE 100* 126* 143* 152*  BUN 27* 21 39* 57*  CREATININE 1.02 1.11 1.14 2.08*  CALCIUM  9.1 8.3* 8.2* 7.2*  MG  --   --   --  2.2  PHOS  --   --   --  6.1*   Liver Function Tests: Recent Labs  Lab 10/24/24 0731 10/25/24 0309  AST 19 14*  ALT 11 11  ALKPHOS 61 51  BILITOT 0.7 0.4  PROT 6.3* 5.6*  ALBUMIN 3.0* 2.6*   No results for input(s): LIPASE, AMYLASE in the last 168 hours. No results for input(s): AMMONIA in the last 168 hours. CBC: Recent Labs  Lab 10/22/24 0614 10/24/24 0731 10/24/24 1217 10/24/24 2115 10/25/24 0309  WBC 10.1 13.0* 11.0* 13.9* 16.4*  HGB 9.4* 7.8* 6.9* 8.5* 8.3*  HCT 29.8* 25.0* 21.7* 26.0* 25.7*  MCV 94.9 94.7 92.3 90.9 91.5  PLT 219 215 149* 152 174   Cardiac Enzymes: No results for input(s): CKTOTAL, CKMB, CKMBINDEX, TROPONINI in the last 168 hours. BNP: Invalid input(s): POCBNP CBG: Recent Labs  Lab 10/21/24 1614 10/21/24 1914 10/24/24 1259  GLUCAP 124* 112* 120*       IMAGING RESULTS:     ASSESSMENT AND PLAN    -Multidisciplinary rounds held today  Acute upper GI bleed -Prepare blood transfusion - Appropriate central venous access - Resuscitative IV fluids - H&H monitoring every 4 hours x 24 hours - Gastroenterology and interventional  radiology has been consulted -reviewed case with GI Dr Onita -10/25/24 - EGD done bleeding improved -s/p CTA -  CTA suggests slow but active bleeding into the gastric lumen along the great    Acute blood loss anemia   As above due to GI bleeding   - Prbc transfusion    - IV fluids    - moniotor H/h   History of CVA and dementia - Avoid hypotension ICU telemetry monitoring - DC antiplatelet and DC NSAIDs at this time   Status post abdominal aortic aneurysm repair - Repeat imaging when able-vascular surgery on case -s/p CTA -  Positive also for Endoleak at or near Endograft graft bifurcation; underlying abdominal aortic aneurysm with native aneurysm sac not significantly   GI/Nutrition GI PROPHYLAXIS as indicated DIET-->TF's as tolerated Constipation protocol as indicated   ENDO - ICU hypoglycemic\Hyperglycemia protocol -check FSBS per protocol   ELECTROLYTES -follow labs as needed -replace as needed -pharmacy consultation   DVT/GI PRX ordered -SCDs  TRANSFUSIONS AS NEEDED MONITOR FSBS ASSESS the need for LABS as needed    Critical care provider statement:   Total critical care time: 31 minutes   Performed by: Parris MD   Critical care time was exclusive of separately billable procedures and treating other patients.   Critical care was necessary to treat or prevent imminent or life-threatening deterioration.   Critical care was time spent personally by me on the following activities: development of treatment plan with patient and/or surrogate as well as nursing, discussions with consultants, evaluation of patient's response to treatment, examination of patient, obtaining history from patient or surrogate, ordering and performing treatments and interventions, ordering and review of laboratory studies, ordering and review of radiographic studies, pulse oximetry and re-evaluation of patient's condition.    Caridad Silveira, M.D.  Pulmonary & Critical Care  Medicine

## 2024-10-26 ENCOUNTER — Encounter: Payer: Self-pay | Admitting: Gastroenterology

## 2024-10-26 DIAGNOSIS — K922 Gastrointestinal hemorrhage, unspecified: Secondary | ICD-10-CM | POA: Diagnosis not present

## 2024-10-26 DIAGNOSIS — G929 Unspecified toxic encephalopathy: Secondary | ICD-10-CM

## 2024-10-26 DIAGNOSIS — N179 Acute kidney failure, unspecified: Secondary | ICD-10-CM | POA: Diagnosis not present

## 2024-10-26 DIAGNOSIS — J9601 Acute respiratory failure with hypoxia: Secondary | ICD-10-CM | POA: Diagnosis not present

## 2024-10-26 LAB — BASIC METABOLIC PANEL WITH GFR
Anion gap: 12 (ref 5–15)
BUN: 79 mg/dL — ABNORMAL HIGH (ref 8–23)
CO2: 21 mmol/L — ABNORMAL LOW (ref 22–32)
Calcium: 7.2 mg/dL — ABNORMAL LOW (ref 8.9–10.3)
Chloride: 112 mmol/L — ABNORMAL HIGH (ref 98–111)
Creatinine, Ser: 1.76 mg/dL — ABNORMAL HIGH (ref 0.61–1.24)
GFR, Estimated: 39 mL/min — ABNORMAL LOW (ref >60)
Glucose, Bld: 140 mg/dL — ABNORMAL HIGH (ref 70–99)
Potassium: 4 mmol/L (ref 3.5–5.1)
Sodium: 145 mmol/L (ref 135–145)

## 2024-10-26 LAB — CBC
HCT: 22.6 % — ABNORMAL LOW (ref 39.0–52.0)
Hemoglobin: 7.3 g/dL — ABNORMAL LOW (ref 13.0–17.0)
MCH: 29.2 pg (ref 26.0–34.0)
MCHC: 32.3 g/dL (ref 30.0–36.0)
MCV: 90.4 fL (ref 80.0–100.0)
Platelets: 150 K/uL (ref 150–400)
RBC: 2.5 MIL/uL — ABNORMAL LOW (ref 4.22–5.81)
RDW: 16.5 % — ABNORMAL HIGH (ref 11.5–15.5)
WBC: 8.2 K/uL (ref 4.0–10.5)
nRBC: 0 % (ref 0.0–0.2)

## 2024-10-26 LAB — TYPE AND SCREEN
ABO/RH(D): A POS
Antibody Screen: NEGATIVE
Extend sample reason: TRANSFUSED
Unit division: 0
Unit division: 0

## 2024-10-26 LAB — HEMOGLOBIN AND HEMATOCRIT, BLOOD
HCT: 22.3 % — ABNORMAL LOW (ref 39.0–52.0)
HCT: 23 % — ABNORMAL LOW (ref 39.0–52.0)
HCT: 23.8 % — ABNORMAL LOW (ref 39.0–52.0)
Hemoglobin: 7.1 g/dL — ABNORMAL LOW (ref 13.0–17.0)
Hemoglobin: 7.6 g/dL — ABNORMAL LOW (ref 13.0–17.0)
Hemoglobin: 7.7 g/dL — ABNORMAL LOW (ref 13.0–17.0)

## 2024-10-26 LAB — BPAM RBC
Blood Product Expiration Date: 202512062359
Blood Product Unit Number: 202512062359
ISSUE DATE / TIME: 202511081730
PRODUCT CODE: 202511081323
PRODUCT CODE: 202512062359
Unit Type and Rh: 6200
Unit Type and Rh: 202512062359
Unit Type and Rh: 6200
Unit Type and Rh: 6200

## 2024-10-26 LAB — MAGNESIUM: Magnesium: 2.2 mg/dL (ref 1.7–2.4)

## 2024-10-26 LAB — GLUCOSE, CAPILLARY: Glucose-Capillary: 108 mg/dL — ABNORMAL HIGH (ref 70–99)

## 2024-10-26 MED ORDER — MIDAZOLAM HCL (PF) 2 MG/2ML IJ SOLN: 2.0000 mg | Freq: Once | INTRAMUSCULAR | Status: AC

## 2024-10-26 MED ORDER — PROPOFOL 1000 MG/100ML IV EMUL
0.0000 ug/kg/min | INTRAVENOUS | Status: DC
  Administered 2024-10-26 (×2): 50 ug/kg/min via INTRAVENOUS
  Administered 2024-10-27 (×2): 40 ug/kg/min via INTRAVENOUS
  Administered 2024-10-27: 50 ug/kg/min via INTRAVENOUS
  Filled 2024-10-26 (×5): qty 100

## 2024-10-26 MED ORDER — DEXMEDETOMIDINE HCL IN NACL 400 MCG/100ML IV SOLN
0.0000 ug/kg/h | INTRAVENOUS | Status: DC
  Administered 2024-10-26: 0.7 ug/kg/h via INTRAVENOUS
  Administered 2024-10-26: 0.9 ug/kg/h via INTRAVENOUS
  Filled 2024-10-26 (×2): qty 100

## 2024-10-26 MED ORDER — MIDAZOLAM HCL 2 MG/2ML IJ SOLN
INTRAMUSCULAR | Status: AC
  Administered 2024-10-26: 2 mg via INTRAVENOUS
  Filled 2024-10-26: qty 2

## 2024-10-26 MED ORDER — PROPOFOL 1000 MG/100ML IV EMUL
INTRAVENOUS | Status: AC
  Administered 2024-10-26: 20 ug/kg/min via INTRAVENOUS
  Filled 2024-10-26: qty 100

## 2024-10-26 MED ORDER — SODIUM CHLORIDE 0.9% IV SOLUTION: Freq: Once | INTRAVENOUS | Status: AC

## 2024-10-26 NOTE — Progress Notes (Signed)
 NAME:  Albert Livingood., MRN:  [id number], DOB:  01-Feb-1946, LOS: 2 ADMISSION DATE:  10/24/2024, CONSULTATION DATE:  10/24/2024 REFERRING MD:  Miki, CHIEF COMPLAINT:  Acute GI Bleed    Brief Pt Description / Synopsis:  78 y.o. male with PMHx significant for AAA with recent endovascular stent grafting on October 21 2024, who is admitted with Acute Upper GI Bleed.  EGD failed to localize the source of bleeding, ultimately requiring coil embolization of the mid-segment of the gastroepiploic artery along the greater curvature of the stomach.  Repeat EGD showed no evidence of underlying lesion or continued bleeding.   History of Present Illness:  Albert [name] is a 78 year old male with a history of dementia, Barrett's esophagus chronic pain syndrome, history of CVA, abdominal aortic aneurysm status post endograft few days ago on October 21, 2024 who came in today with hematemesis.  Patient's wife reported syncopal episode and feelings of malaise subsequently started having melanotic stools and emesis with blood.  Patient was apparently on antiplatelet therapy as well as NSAIDs at home.  He did have CTA performed which showed active extravasation of gastric lumen.  His blood work showed a decrement in hemoglobin from 10.9-7.8. He did have order for 1 unit blood transfusion which is in process of being infused at this moment.  Vascular surgery evaluated patient and did not feel that bleeding was related to prior surgery.  GI evaluation was performed and gastroenterologist attempted EGD with findings of large clotted blood in stomach.  He was unable to finish procedure and requested patient to be transferred to intensive care unit.  Further interventional radiology was contacted for possible embolization and will attempt to evaluate patient today.  Patient received IV Protonix.  Patient is on resuscitative IV fluids.  He is not currently on vasopressor support but he did receive 1 dose of Neo-Synephrine  earlier today due to transient hypotension.  ICU admission is requested due to upper GI bleeding.   PCCM asked to admit for further workup and treatment.  Please see Significant Hospital Events section below for full detailed hospital course.   Pertinent  Medical History   Past Medical History:  Diagnosis Date   Anemia    Anxiety    a.) on BZO (alproazolam) PRN   Arthritis of both knees    Back pain    Barrett esophagus    Chronic back pain    Dementia (HCC)    arising in the senium and presenium   Displaced fracture of proximal phalanx of right lesser toe(s), initial encounter for closed fracture    Diverticulosis    GERD (gastroesophageal reflux disease)    OTC meds as needed   Headache    History of colon polyps    benign    History of kidney stones    History of migraine    Hyperlipidemia    Hypertension    Insomnia    Joint pain    Nephrolithiasis    Pre-diabetes    Stroke (HCC)    without residual deficits    Micro Data:  11/8: MRSA PCR>> negative   Antimicrobials:   Anti-infectives (From admission, onward)    None       Significant Hospital Events: Including procedures, antibiotic start and stop dates in addition to other pertinent events   11/8: Presented to ED with acute GI bleed.  CT angio suggests slow but active bleeding into the gastric lumen along the great curve.  GI consulted, EGD showed large  amount of blood and clot in stomach, but unable to clear blood and clot enough to visualize source of bleeding.  IR consulted and performed coil embolization of the gastroepiploic artery at the greater curvature. 11/9: Repeat EGD again with blood and clot.  General Surgery consulted,  felt surgery would likely be futile and minimal chance of recovery, family declines pursing surgery. Code status changed to DNR/DNI. 11/10: Hgb slowly trending down, remains in low 7's, no obvious bleeding noted per nursing, no plan for additional intervention per GI.  Will  transfuse 1 unit pRBC's and 1 FFP. AKI improving.  On minimal vent support, failed WUA/SBT due to severe agitation.   Interim History / Subjective:  As outlined above under Significant Hospital Events section  Objective   Blood pressure 133/74, pulse 73, temperature 99 F (37.2 C), temperature source Axillary, resp. rate (!) 22, height 6' 1 (1.854 m), weight 91.7 kg, SpO2 94%.    Vent Mode: PRVC FiO2 (%):  [30 %-40 %] 40 % Set Rate:  [16 bmp] 16 bmp Vt Set:  [500 mL] 500 mL PEEP:  [5 cmH20] 5 cmH20 Plateau Pressure:  [20 cmH20] 20 cmH20   Intake/Output Summary (Last 24 hours) at 10/26/2024 1001 Last data filed at 10/26/2024 0348 Gross per 24 hour  Intake 963.62 ml  Output 1200 ml  Net -236.38 ml   Filed Weights   10/24/24 0726 10/25/24 0404 10/26/24 0500  Weight: 90 kg 86.6 kg 91.7 kg    Examination: General: Acute on chronically ill appearing male, laying in bed, intubated and sedated, in NAD HENT: Atraumatic, normocephalic, neck supple, no JVD, orally intubated Lungs: Mechanical breath sounds throughout, even, occasionally overbreathes the vent  Cardiovascular: RRR, s1s2, no M/R/G Abdomen: exam limited due to intubation and sedation: obese, soft, nondistended, no guarding or rebound tenderness Extremities: Normal bulk and tone, no deformities, trace edema BLE Neuro: Sedated, intermittently agitated and unable to redirect, moves all extremities, PERRL GU: Foley catheter in place draining yellow urine   Resolved Hospital Problem list     Assessment & Plan:   #Acute Upper GI Bleed, status post coil embolization of of gastroepiploic artery on 11/8, and EGD x2 with no underlying lesion found or continued bleeding noted #Acute Blood Loss Anemia -Monitor for S/Sx of bleeding -Trend CBC (H&H q6h) -SCD's  for VTE Prophylaxis  -Transfuse for Hgb <7 -Transfuse Platelets for Platelet count < 10K; < 50K with active bleeding; < 100K with Neurosurgical procedures/processes   -Continue PPI BID -GI following, appreciate input -General Surgery consulted ~ felt that subtotal gastrectomy would likely be futile given his current critical illness superimposed chronic co morbidities, while adding pain and suffering ~  family elected not to pursue surgery   #Post Procedure Ventilator Management -Full vent support, implement lung protective strategies -Plateau pressures less than 30 cm H20 -Wean FiO2 & PEEP as tolerated to maintain O2 sats >92% -Follow intermittent Chest X-ray & ABG as needed -Spontaneous Breathing Trials when respiratory parameters met and mental status permits -Implement VAP Bundle -Prn Bronchodilators  #AKI -Monitor I&O's / urinary output -Follow BMP -Ensure adequate renal perfusion -Avoid nephrotoxic agents as able -Replace electrolytes as indicated ~ Pharmacy following for assistance with electrolyte replacement  #Acute Metabolic Encephalopathy #Sedation needs in setting of mechanical ventilation PMHx: Dementia, migraines -Maintain a RASS goal of 0 to -1 -Propofol  and Fentanyl  to maintain RASS goal -Avoid sedating medications as able -Daily wake up assessment        Pt is critically ill  with multiorgan failure. Prognosis is guarded, high risk for further decompensation, cardiac arrest, and death.  Given current critical illness superimposed on multiple chronic co-morbidities and advanced age, overall long term prognosis is poor.  Pt is DNR/DNI status.  Depending on clinical course, may consult Palliative Care to assist with GOC discussions.   Best Practice (right click and Reselect all SmartList Selections daily)   Diet/type: NPO DVT prophylaxis: SCD GI prophylaxis: PPI Lines: Central line and yes and it is still needed Foley:  Yes, and it is still needed Code Status:  DNR Last date of multidisciplinary goals of care discussion [11/10]  11/10: Pt's wife updated at bedside on plan of care.  Labs   CBC: Recent Labs  Lab  10/24/24 0731 10/24/24 1217 10/24/24 2115 10/25/24 0309 10/25/24 1034 10/25/24 1708 10/26/24 0010 10/26/24 0423 10/26/24 0755  WBC 13.0* 11.0* 13.9* 16.4*  --   --   --   --  8.2  HGB 7.8* 6.9* 8.5* 8.3* 7.8* 7.2* 7.6* 7.1* 7.3*  HCT 25.0* 21.7* 26.0* 25.7* 23.9* 21.9* 23.0* 22.3* 22.6*  MCV 94.7 92.3 90.9 91.5  --   --   --   --  90.4  PLT 215 149* 152 174  --   --   --   --  150    Basic Metabolic Panel: Recent Labs  Lab 10/20/24 0920 10/22/24 0614 10/24/24 0731 10/25/24 0309 10/26/24 0423  NA 144 141 140 143 145  K 4.0 3.7 3.5 4.7 4.0  CL 105 106 106 111 112*  CO2 27 24 21* 20* 21*  GLUCOSE 100* 126* 143* 152* 140*  BUN 27* 21 39* 57* 79*  CREATININE 1.02 1.11 1.14 2.08* 1.76*  CALCIUM  9.1 8.3* 8.2* 7.2* 7.2*  MG  --   --   --  2.2 2.2  PHOS  --   --   --  6.1*  --    GFR: Estimated Creatinine Clearance: 39.7 mL/min (A) (by C-G formula based on SCr of 1.76 mg/dL (H)). Recent Labs  Lab 10/24/24 1217 10/24/24 2115 10/25/24 0309 10/26/24 0755  WBC 11.0* 13.9* 16.4* 8.2    Liver Function Tests: Recent Labs  Lab 10/24/24 0731 10/25/24 0309  AST 19 14*  ALT 11 11  ALKPHOS 61 51  BILITOT 0.7 0.4  PROT 6.3* 5.6*  ALBUMIN 3.0* 2.6*   No results for input(s): LIPASE, AMYLASE in the last 168 hours. No results for input(s): AMMONIA in the last 168 hours.  ABG    Component Value Date/Time   PHART 7.33 (L) 10/24/2024 1411   PCO2ART 41 10/24/2024 1411   PO2ART 57 (L) 10/24/2024 1411   HCO3 21.6 10/24/2024 1411   ACIDBASEDEF 4.1 (H) 10/24/2024 1411   O2SAT 89.5 10/24/2024 1411     Coagulation Profile: Recent Labs  Lab 10/24/24 0731 10/25/24 0309  INR 1.2 1.2    Cardiac Enzymes: No results for input(s): CKTOTAL, CKMB, CKMBINDEX, TROPONINI in the last 168 hours.  HbA1C: Hgb A1c MFr Bld  Date/Time Value Ref Range Status  06/24/2024 05:29 AM 5.2 4.8 - 5.6 % Final    Comment:    (NOTE)         Prediabetes: 5.7 - 6.4          Diabetes: >6.4         Glycemic control for adults with diabetes: <7.0     CBG: Recent Labs  Lab 10/21/24 1614 10/21/24 1914 10/24/24 1259 10/25/24 1134 10/25/24 2022  GLUCAP 124* 112*  120* 149* 139*    Review of Systems:   Unable to assess due to intubation/sedation/critical illness   Past Medical History:  He,  has a past medical history of Anemia, Anxiety, Arthritis of both knees, Back pain, Barrett esophagus, Chronic back pain, Dementia (HCC), Displaced fracture of proximal phalanx of right lesser toe(s), initial encounter for closed fracture, Diverticulosis, GERD (gastroesophageal reflux disease), Headache, History of colon polyps, History of kidney stones, History of migraine, Hyperlipidemia, Hypertension, Insomnia, Joint pain, Nephrolithiasis, Pre-diabetes, and Stroke (HCC).   Surgical History:   Past Surgical History:  Procedure Laterality Date   AMPUTATION TOE Right 04/13/2022   Procedure: 28820 - AMPUTATION TOE;  Surgeon: Ashley Soulier, DPM;  Location: [facility] ORS;  Service: Podiatry;  Laterality: Right;   ANKLE ARTHROSCOPY Right 04/13/2022   Procedure: A-SCOPE/OCD REPAIR;  Surgeon: Ashley Soulier, DPM;  Location: [facility] ORS;  Service: Podiatry;  Laterality: Right;   ANKLE SURGERY Right 1997   ankles/screws    ARTHROSCOPIC REPAIR ACL Right 1993   BACK SURGERY  2017   titanium rods and pins in lower back   COLONOSCOPY     ENDOVASCULAR STENT GRAFT (AAA) N/A 10/21/2024   Procedure: ENDOVASCULAR STENT GRAFT (AAA);  Surgeon: Marea Selinda RAMAN, MD;  Location: [facility] INVASIVE [facility];  Service: Cardiovascular;  Laterality: N/A;   EPIDIDYMECTOMY Left 04/10/2017   Procedure: EPIDIDYMECTOMY;  Surgeon: Rosina Riis, MD;  Location: [facility] ORS;  Service: Urology;  Laterality: Left;   ESOPHAGOGASTRODUODENOSCOPY     ESOPHAGOGASTRODUODENOSCOPY N/A 10/24/2024   Procedure: EGD (ESOPHAGOGASTRODUODENOSCOPY);  Surgeon: Onita Elspeth Sharper, DO;  Location: [facility];  Service: Gastroenterology;   Laterality: N/A;   ESOPHAGOGASTRODUODENOSCOPY N/A 10/25/2024   Procedure: EGD (ESOPHAGOGASTRODUODENOSCOPY);  Surgeon: Onita Elspeth Sharper, DO;  Location: [facility] END[facility]COPY;  Service: Gastroenterology;  Laterality: N/A;  in ICU   IR EMBO ART  VEN HEMORR LYMPH EXTRAV  INC GUIDE ROADMAPPING  10/24/2024   LUMBAR FUSION  March 13, 2016   L4/L5 fusion   MINOR HARDWARE REMOVAL Right 04/13/2022   Procedure: REMOVAL SCREW; DEEP;  Surgeon: Ashley Soulier, DPM;  Location: [facility] ORS;  Service: Podiatry;  Laterality: Right;   TONSILLECTOMY  1962   adenoidectomy    VASECTOMY  1973   WOUND DEBRIDEMENT Right 04/13/2022   Procedure: 29906 - SUBTALAR ARTHROSCOPY;  Surgeon: Ashley Soulier, DPM;  Location: [facility] ORS;  Service: Podiatry;  Laterality: Right;     Social History:   reports that he has been smoking cigarettes. He has a 25 pack-year smoking history. He has never used smokeless tobacco. He reports that he does not drink alcohol and does not use drugs.   Family History:  His family history includes Arthritis in his mother; Esophageal cancer in his father. There is no history of Prostate cancer, Bladder Cancer, or Kidney cancer.   Allergies Allergies  Allergen Reactions   Codeine Nausea And Vomiting     Home Medications  Prior to Admission medications   Medication Sig Start Date End Date Taking? Authorizing Provider  acetaminophen  (TYLENOL ) 325 MG tablet Take 650 mg by mouth every 8 (eight) hours as needed for moderate pain.   Yes [provider]  amLODipine  (NORVASC ) 10 MG tablet Take 10 mg by mouth daily. 08/27/18  Yes [provider]  aspirin  EC 81 MG tablet Take 81 mg by mouth daily.   Yes [provider]  clopidogrel  (PLAVIX ) 75 MG tablet 75 mg daily. 07/29/19  Yes [provider]  cyanocobalamin  (VITAMIN B12) 1000 MCG tablet Take 1,000  mcg by mouth.  Take 1,000 mcg by mouth once daily   Yes [provider]  diphenhydramine-acetaminophen  (TYLENOL  PM)  25-500 MG TABS tablet Take 1 tablet by mouth at bedtime.   Yes [provider]  donepezil  (ARICEPT ) 10 MG tablet Take 10 mg by mouth.  TAKE 1 TABLET(10 MG) BY MOUTH AT BEDTIME 04/23/24  Yes [provider]  DULoxetine  (CYMBALTA ) 60 MG capsule Take 60 mg by mouth daily. 02/25/19 10/25/24 Yes [provider]  ferrous sulfate  325 (65 FE) MG tablet Take 325 mg by mouth.  Take 325 mg by mouth daily with breakfast   Yes [provider]  ibuprofen  (ADVIL ) 400 MG tablet Take 400 mg by mouth every 6 (six) hours as needed.   Yes [provider]  Multiple Vitamin (MULTIVITAMIN WITH MINERALS) TABS tablet Take 1 tablet by mouth daily.   Yes [provider]  naproxen sodium (ALEVE) 220 MG tablet Take 220 mg by mouth daily as needed.   Yes [provider]  QUEtiapine  (SEROQUEL ) 25 MG tablet Take 25 mg by mouth at bedtime. 06/15/24  Yes [provider]  rosuvastatin  (CRESTOR ) 20 MG tablet Take 20 mg by mouth daily. 07/29/19  Yes [provider]  ALPRAZolam  (XANAX ) 0.25 MG tablet Take 0.25 mg by mouth at bedtime as needed for anxiety. Patient not taking: Reported on 10/22/2024 05/01/19   [provider]  naphazoline-pheniramine (NAPHCON-A) 0.025-0.3 % ophthalmic solution Place 1 drop into both eyes 4 (four) times daily as needed for eye irritation. Patient not taking: Reported on 10/25/2024    [provider]  trolamine salicylate (ASPERCREME) 10 % cream Apply 1 application topically as needed for muscle pain. Patient not taking: Reported on 10/25/2024    [provider]     Critical care time: 40 minutes     Inge Lecher, AGACNP-BC [facility] Prefer epic messenger for cross cover needs If after hours, please call E-link

## 2024-10-26 NOTE — Progress Notes (Signed)
 Initial Nutrition Assessment  DOCUMENTATION CODES:   Non-severe (moderate) malnutrition in context of social or environmental circumstances  INTERVENTION:   Once appropriate for tube feeds, recommend:  Pivot 1.5@60ml /hr- Initiate at 46ml/hr and increase by 10ml/hr q 8 hours until goal rate is reached.   Free water  flushes 30ml q4 hours to maintain tube patency   Regimen provides 2160kcal/day, 135g/day protein and 1221ml/day of free water .  Vitamin C 500mg  BID via tube   Pt at high refeed risk; recommend monitor potassium, magnesium and phosphorus labs daily until stable  Daily weights   NUTRITION DIAGNOSIS:   Moderate Malnutrition related to social / environmental circumstances as evidenced by moderate fat depletion, moderate muscle depletion, severe muscle depletion.  GOAL:   Patient will meet greater than or equal to 90% of their needs  MONITOR:   Vent status, Labs, Weight trends, I & O's, Skin  REASON FOR ASSESSMENT:   Ventilator    ASSESSMENT:   78 y/o male with h/o GERD, CVA, HLD, anxiety, dementia, diverticulosis, kidney stones, Barrett's esophagus, HTN and abdominal aortic aneurysm s/p recent repair 11/5 who is admitted with  hematemesis s/p coil embolization of of gastroepiploic artery on 11/8 and AKI.  Pt sedated and ventilated. No OGT in place. Will plan to initiate tube feeds on ok per MD. Pt is likely at high refeed risk. Per chart, pt appears weight stable pta.   Medications reviewed and include: protonix, precedex, propofol    Labs reviewed: K 4.0 wnl, BUN 79(H), creat 1.76(H), Mg 2.2 wnl P 6.1(H)- 11/9 Hgb 7.3(L), Hct 22.6(L) Cbgs- 139, 149 x 48 hrs   Patient is currently intubated on ventilator support MV: 12 L/min Temp (24hrs), Avg:99.2 F (37.3 C), Min:97.9 F (36.6 C), Max:100.5 F (38.1 C)  MAP >49mmHg   UOP-   NUTRITION - FOCUSED PHYSICAL EXAM:  Flowsheet Row Most Recent Value  Orbital Region Moderate depletion  Upper Arm  Region Moderate depletion  Thoracic and Lumbar Region Moderate depletion  Buccal Region Moderate depletion  Temple Region Moderate depletion  Clavicle Bone Region Severe depletion  Clavicle and Acromion Bone Region Severe depletion  Scapular Bone Region Moderate depletion  Dorsal Hand Unable to assess  Patellar Region Severe depletion  Anterior Thigh Region Severe depletion  Posterior Calf Region Severe depletion  Edema (RD Assessment) Mild  Hair Reviewed  Eyes Reviewed  Mouth Reviewed  Skin Reviewed  Nails Reviewed   Diet Order:   Diet Order             Diet NPO time specified  Diet effective now                  EDUCATION NEEDS:   No education needs have been identified at this time  Skin:  Skin Assessment: Reviewed RN Assessment (ecchymosis)  Last BM:  11/9- TYPE 6  Height:   Ht Readings from Last 1 Encounters:  10/24/24 6' 1 (1.854 m)    Weight:   Wt Readings from Last 1 Encounters:  10/26/24 91.7 kg    Ideal Body Weight:  83.6 kg  BMI:  Body mass index is 26.67 kg/m.  Estimated Nutritional Needs:   Kcal:  2137kcal/day  Protein:  130-145g/day  Fluid:  2.2-2.5L/day  Augustin Shams MS, RD, LDN If unable to be reached, please send secure chat to RD inpatient available from 8:00a-4:00p daily

## 2024-10-26 NOTE — Progress Notes (Signed)
 PHARMACY CONSULT NOTE - FOLLOW UP  Pharmacy Consult for Electrolyte Monitoring and Replacement   Recent Labs: Potassium (mmol/L)  Date Value  10/26/2024 4.0  09/05/2014 3.7   Magnesium (mg/dL)  Date Value  88/89/7974 2.2   Calcium  (mg/dL)  Date Value  88/89/7974 7.2 (L)   Calcium , Total (mg/dL)  Date Value  90/79/7984 9.9   Albumin (g/dL)  Date Value  88/90/7974 2.6 (L)  09/05/2014 3.5   Phosphorus (mg/dL)  Date Value  88/90/7974 6.1 (H)   Sodium (mmol/L)  Date Value  10/26/2024 145  09/05/2014 140     Assessment: 78 y.o. male with medical history significant for chronic dementia, Barrett's esophagus, chronic pain, CVA, AAA s/p recent endograft 10/21/2024 who presented to ED with hematemesis. Pharmacy is asked to follow and replace electrolytes while in CCU  Goal of Therapy:  Electrolytes WNL  Plan:  ---no electrolyte replacement warranted for today ---recheck electrolytes in am  Leonor JAYSON Argyle ,PharmD 10/26/2024 7:29 AM

## 2024-10-26 NOTE — TOC Progression Note (Signed)
 Transition of Care (TOC) - Progression Note    Patient Details  Name: Albert [name] MRN: [id number] Date of Birth: November 01, 1946  Transition of Care [facility]) CM/SW Contact  K'La JINNY Ruts, LCSW Phone Number: 10/26/2024, 12:06 PM  Clinical Narrative:    Chart reviewed. The patient is intubated. Called the patient spouse to consult. I left a voicemail and I am waiting for the patient spouse to call back.                      Expected Discharge Plan and Services                                               Social Drivers of Health (SDOH) Interventions SDOH Screenings   Food Insecurity: Patient Unable To Answer (10/24/2024)  Housing: Patient Unable To Answer (10/24/2024)  Transportation Needs: Patient Unable To Answer (10/24/2024)  Utilities: Patient Unable To Answer (10/24/2024)  Financial Resource Strain: Low Risk  (08/31/2024)   Received from [facility]  Social Connections: Unknown (10/21/2024)  Tobacco Use: High Risk (10/24/2024)    Readmission Risk Interventions     No data to display

## 2024-10-26 NOTE — Progress Notes (Signed)
 [facility]  Daily Progress Note   Subjective  -   Patient is intubated and sedated.  Had been discharged home on postoperative day #1 from abdominal aortic aneurysm repair last Wednesday.  Returned this weekend with significant GI bleeding.  Events of the weekend noted and appreciate the multiple services that helped with his treatment over the weekend.  Objective Vitals:   10/26/24 1249 10/26/24 1321 10/26/24 1340 10/26/24 1409  BP: (!) 97/50 (!) 107/55 (!) 112/59 122/60  Pulse:  60    Resp:  16    Temp: 99.5 F (37.5 C) 98.9 F (37.2 C) 99 F (37.2 C) 98.9 F (37.2 C)  TempSrc: Axillary Axillary Axillary Axillary  SpO2:      Weight:      Height:        Intake/Output Summary (Last 24 hours) at 10/26/2024 1543 Last data filed at 10/26/2024 1412 Gross per 24 hour  Intake 2099.62 ml  Output 1200 ml  Net 899.62 ml    PULM  coarse breath sounds on the ventilator CV  RRR VASC  access sites are without hematoma in both groins.  Feet are warm and well-perfused.  Laboratory CBC    Component Value Date/Time   WBC 8.2 10/26/2024 0755   HGB 7.3 (L) 10/26/2024 0755   HGB 12.6 (L) 09/05/2014 1506   HCT 22.6 (L) 10/26/2024 0755   HCT 39.0 (L) 09/05/2014 1506   PLT 150 10/26/2024 0755   PLT 236 09/05/2014 1506    BMET    Component Value Date/Time   NA 145 10/26/2024 0423   NA 140 09/05/2014 1506   K 4.0 10/26/2024 0423   K 3.7 09/05/2014 1506   CL 112 (H) 10/26/2024 0423   CL 107 09/05/2014 1506   CO2 21 (L) 10/26/2024 0423   CO2 26 09/05/2014 1506   GLUCOSE 140 (H) 10/26/2024 0423   GLUCOSE 125 (H) 09/05/2014 1506   BUN 79 (H) 10/26/2024 0423   BUN 23 (H) 09/05/2014 1506   CREATININE 1.76 (H) 10/26/2024 0423   CREATININE 1.08 09/05/2014 1506   CALCIUM  7.2 (L) 10/26/2024 0423   CALCIUM  9.9 09/05/2014 1506   GFRNONAA 39 (L) 10/26/2024 0423   GFRNONAA >60 09/05/2014 1506   GFRAA >60 03/07/2016 1026   GFRAA >60 09/05/2014 1506     Assessment/Planning:   5-day status post endovascular abdominal aortic aneurysm repair.  No issues with stent graft on CT scan. 2 days post embolization and endoscopy for GI bleed.  Hemoglobin seems to stabilized out and leveled off.  No further evidence of brisk ongoing bleeding. Respiratory failure.  On minimal vent support.  Heavily sedated with propofol  and fentanyl  drips currently.  Had significant agitation.  Critical care is primary and hopefully working on extubation and neuro evaluation.   Selinda Gu  10/26/2024, 3:43 PM

## 2024-10-26 NOTE — Plan of Care (Signed)
 Patient intermittently restless overnight, responds well to IV versed , Hgb improved to 7.6 with the 1 unit of PRBC. Low grade temp at shift start improved with room temp modifications

## 2024-10-27 ENCOUNTER — Inpatient Hospital Stay

## 2024-10-27 DIAGNOSIS — F02C11 Dementia in other diseases classified elsewhere, severe, with agitation: Secondary | ICD-10-CM | POA: Diagnosis not present

## 2024-10-27 DIAGNOSIS — G928 Other toxic encephalopathy: Secondary | ICD-10-CM

## 2024-10-27 DIAGNOSIS — J9601 Acute respiratory failure with hypoxia: Secondary | ICD-10-CM | POA: Diagnosis not present

## 2024-10-27 DIAGNOSIS — N179 Acute kidney failure, unspecified: Secondary | ICD-10-CM | POA: Diagnosis not present

## 2024-10-27 DIAGNOSIS — K922 Gastrointestinal hemorrhage, unspecified: Secondary | ICD-10-CM | POA: Diagnosis not present

## 2024-10-27 LAB — CBC
HCT: 24.8 % — ABNORMAL LOW (ref 39.0–52.0)
Hemoglobin: 7.9 g/dL — ABNORMAL LOW (ref 13.0–17.0)
MCH: 28.7 pg (ref 26.0–34.0)
MCHC: 31.9 g/dL (ref 30.0–36.0)
MCV: 90.2 fL (ref 80.0–100.0)
Platelets: 142 K/uL — ABNORMAL LOW (ref 150–400)
RBC: 2.75 MIL/uL — ABNORMAL LOW (ref 4.22–5.81)
RDW: 17 % — ABNORMAL HIGH (ref 11.5–15.5)
WBC: 9.4 K/uL (ref 4.0–10.5)
nRBC: 0 % (ref 0.0–0.2)

## 2024-10-27 LAB — HEMOGLOBIN AND HEMATOCRIT, BLOOD
HCT: 23.8 % — ABNORMAL LOW (ref 39.0–52.0)
HCT: 24.7 % — ABNORMAL LOW (ref 39.0–52.0)
Hemoglobin: 7.6 g/dL — ABNORMAL LOW (ref 13.0–17.0)
Hemoglobin: 8 g/dL — ABNORMAL LOW (ref 13.0–17.0)

## 2024-10-27 LAB — RENAL FUNCTION PANEL
Albumin: 2.4 g/dL — ABNORMAL LOW (ref 3.5–5.0)
Anion gap: 8 (ref 5–15)
BUN: 75 mg/dL — ABNORMAL HIGH (ref 8–23)
CO2: 23 mmol/L (ref 22–32)
Calcium: 7.7 mg/dL — ABNORMAL LOW (ref 8.9–10.3)
Chloride: 115 mmol/L — ABNORMAL HIGH (ref 98–111)
Creatinine, Ser: 1.47 mg/dL — ABNORMAL HIGH (ref 0.61–1.24)
GFR, Estimated: 49 mL/min — ABNORMAL LOW (ref >60)
Glucose, Bld: 112 mg/dL — ABNORMAL HIGH (ref 70–99)
Phosphorus: 4.2 mg/dL (ref 2.5–4.6)
Potassium: 3.7 mmol/L (ref 3.5–5.1)
Sodium: 146 mmol/L — ABNORMAL HIGH (ref 135–145)

## 2024-10-27 LAB — GLUCOSE, CAPILLARY
Glucose-Capillary: 101 mg/dL — ABNORMAL HIGH (ref 70–99)
Glucose-Capillary: 105 mg/dL — ABNORMAL HIGH (ref 70–99)
Glucose-Capillary: 105 mg/dL — ABNORMAL HIGH (ref 70–99)
Glucose-Capillary: 106 mg/dL — ABNORMAL HIGH (ref 70–99)
Glucose-Capillary: 133 mg/dL — ABNORMAL HIGH (ref 70–99)
Glucose-Capillary: 163 mg/dL — ABNORMAL HIGH (ref 70–99)
Glucose-Capillary: 172 mg/dL — ABNORMAL HIGH (ref 70–99)
Glucose-Capillary: 185 mg/dL — ABNORMAL HIGH (ref 70–99)
Glucose-Capillary: 67 mg/dL — ABNORMAL LOW (ref 70–99)

## 2024-10-27 LAB — BPAM FFP
Blood Product Expiration Date: 202511152359
ISSUE DATE / TIME: 202511101314
Unit Type and Rh: 6200

## 2024-10-27 LAB — PREPARE FRESH FROZEN PLASMA

## 2024-10-27 LAB — TRIGLYCERIDES: Triglycerides: 235 mg/dL — ABNORMAL HIGH (ref <150)

## 2024-10-27 MED ORDER — DEXTROSE 50 % IV SOLN
12.5000 g | INTRAVENOUS | Status: AC
  Administered 2024-10-27: 12.5 g via INTRAVENOUS
  Filled 2024-10-27: qty 50

## 2024-10-27 MED ORDER — DEXAMETHASONE SOD PHOSPHATE PF 10 MG/ML IJ SOLN
10.0000 mg | Freq: Once | INTRAMUSCULAR | Status: AC
  Administered 2024-10-27: 10 mg via INTRAVENOUS
  Filled 2024-10-27: qty 1

## 2024-10-27 MED ORDER — PROSOURCE TF20 ENFIT COMPATIBL EN LIQD
60.0000 mL | Freq: Every day | ENTERAL | Status: DC
  Administered 2024-10-28 – 2024-11-03 (×7): 60 mL
  Filled 2024-10-27 (×7): qty 60

## 2024-10-27 MED ORDER — HALOPERIDOL LACTATE 5 MG/ML IJ SOLN
INTRAMUSCULAR | Status: AC
  Administered 2024-10-27: 5 mg via INTRAVENOUS
  Filled 2024-10-27: qty 1

## 2024-10-27 MED ORDER — IPRATROPIUM-ALBUTEROL 0.5-2.5 (3) MG/3ML IN SOLN
3.0000 mL | RESPIRATORY_TRACT | Status: DC | PRN
Start: 2024-10-27 — End: 2024-11-10
  Administered 2024-10-28: 3 mL via RESPIRATORY_TRACT
  Filled 2024-10-27: qty 3

## 2024-10-27 MED ORDER — THIAMINE MONONITRATE 100 MG PO TABS: 100.0000 mg | ORAL_TABLET | Freq: Every day | ORAL | Status: DC

## 2024-10-27 MED ORDER — VITAMIN C 500 MG PO TABS
500.0000 mg | ORAL_TABLET | Freq: Two times a day (BID) | ORAL | Status: DC
  Administered 2024-10-27 – 2024-11-03 (×14): 500 mg
  Filled 2024-10-27 (×14): qty 1

## 2024-10-27 MED ORDER — FUROSEMIDE 10 MG/ML IJ SOLN
40.0000 mg | Freq: Once | INTRAMUSCULAR | Status: AC
  Administered 2024-10-27: 40 mg via INTRAVENOUS
  Filled 2024-10-27: qty 4

## 2024-10-27 MED ORDER — IPRATROPIUM-ALBUTEROL 0.5-2.5 (3) MG/3ML IN SOLN
3.0000 mL | RESPIRATORY_TRACT | Status: DC
  Administered 2024-10-27 (×3): 3 mL via RESPIRATORY_TRACT
  Filled 2024-10-27 (×3): qty 3

## 2024-10-27 MED ORDER — MORPHINE SULFATE (PF) 2 MG/ML IV SOLN
2.0000 mg | INTRAVENOUS | Status: DC | PRN
  Filled 2024-10-27: qty 1

## 2024-10-27 MED ORDER — POTASSIUM CHLORIDE 10 MEQ/100ML IV SOLN
10.0000 meq | INTRAVENOUS | Status: AC
  Administered 2024-10-27 (×2): 10 meq via INTRAVENOUS
  Filled 2024-10-27 (×2): qty 100

## 2024-10-27 MED ORDER — DEXMEDETOMIDINE HCL IN NACL 400 MCG/100ML IV SOLN
0.0000 ug/kg/h | INTRAVENOUS | Status: DC
  Administered 2024-10-27 (×2): 1.2 ug/kg/h via INTRAVENOUS
  Administered 2024-10-27: 0.6 ug/kg/h via INTRAVENOUS
  Administered 2024-10-27: 0.4 ug/kg/h via INTRAVENOUS
  Administered 2024-10-28 (×3): 1.2 ug/kg/h via INTRAVENOUS
  Administered 2024-10-28: 1 ug/kg/h via INTRAVENOUS
  Administered 2024-10-28: 1.2 ug/kg/h via INTRAVENOUS
  Administered 2024-10-29: 0.6 ug/kg/h via INTRAVENOUS
  Administered 2024-10-29: 1.2 ug/kg/h via INTRAVENOUS
  Filled 2024-10-27 (×12): qty 100

## 2024-10-27 MED ORDER — MORPHINE SULFATE (PF) 2 MG/ML IV SOLN
2.0000 mg | Freq: Once | INTRAVENOUS | Status: AC
  Administered 2024-10-27: 2 mg via INTRAVENOUS

## 2024-10-27 MED ORDER — FREE WATER
150.0000 mL | Status: DC
  Administered 2024-10-27 – 2024-10-28 (×6): 150 mL

## 2024-10-27 MED ORDER — OSMOLITE 1.5 CAL PO LIQD
1000.0000 mL | ORAL | Status: DC
  Administered 2024-10-27 – 2024-11-03 (×6): 1000 mL

## 2024-10-27 MED ORDER — HALOPERIDOL LACTATE 5 MG/ML IJ SOLN
5.0000 mg | Freq: Four times a day (QID) | INTRAMUSCULAR | Status: DC | PRN
  Administered 2024-10-27 – 2024-11-05 (×7): 5 mg via INTRAVENOUS
  Filled 2024-10-27 (×8): qty 1

## 2024-10-27 MED ORDER — IPRATROPIUM-ALBUTEROL 0.5-2.5 (3) MG/3ML IN SOLN
3.0000 mL | Freq: Three times a day (TID) | RESPIRATORY_TRACT | Status: DC
  Administered 2024-10-28: 3 mL via RESPIRATORY_TRACT
  Filled 2024-10-27: qty 3

## 2024-10-27 MED ORDER — MORPHINE SULFATE (PF) 2 MG/ML IV SOLN
INTRAVENOUS | Status: AC
  Administered 2024-10-27: 2 mg via INTRAMUSCULAR
  Filled 2024-10-27: qty 1

## 2024-10-27 MED ORDER — FUROSEMIDE 10 MG/ML IJ SOLN
40.0000 mg | Freq: Once | INTRAMUSCULAR | Status: AC
  Administered 2024-10-27: 40 mg via INTRAVENOUS

## 2024-10-27 NOTE — Progress Notes (Signed)
 PHARMACY CONSULT NOTE - FOLLOW UP  Pharmacy Consult for Electrolyte Monitoring and Replacement   Recent Labs: Potassium (mmol/L)  Date Value  10/27/2024 3.7  09/05/2014 3.7   Magnesium (mg/dL)  Date Value  88/89/7974 2.2   Calcium  (mg/dL)  Date Value  88/88/7974 7.7 (L)   Calcium , Total (mg/dL)  Date Value  90/79/7984 9.9   Albumin (g/dL)  Date Value  88/88/7974 2.4 (L)  09/05/2014 3.5   Phosphorus (mg/dL)  Date Value  88/88/7974 4.2   Sodium (mmol/L)  Date Value  10/27/2024 146 (H)  09/05/2014 140     Assessment: 78 y.o. male with medical history significant for chronic dementia, Barrett's esophagus, chronic pain, CVA, AAA s/p recent endograft 10/21/2024 who presented to ED with hematemesis. Pharmacy is asked to follow and replace electrolytes while in CCU  Goal of Therapy:  Electrolytes WNL  Plan:  ---K 3.7 -> ordered 20 mEq Kcl IV ---recheck electrolytes in am  Leonor JAYSON Argyle ,PharmD 10/27/2024 8:16 AM

## 2024-10-27 NOTE — Progress Notes (Signed)
 Rogelia Copping, MD [facility]   [address] [place], [place] [address] Phone: [phone] Fax : [phone]   Subjective: This patient has had no significant signs of bleeding overnight.  The patient had successful coil embolization of the mid segment of the gastroepiploic artery along the greater curvature of the stomach in the region of the suspected source of bleeding.  The patient has also undergone 2 upper endoscopies by Dr. Onita without a source seen.   Objective: Vital signs in last 24 hours: Vitals:   10/27/24 0900 10/27/24 1000 10/27/24 1100 10/27/24 1200  BP: (!) 127/55 (!) 113/56 (!) 115/56 (!) 139/114  Pulse: 66 62 (!) 59 (!) 108  Resp: 14 20 16  (!) 27  Temp:    98.7 F (37.1 C)  TempSrc:      SpO2: 94% 94% 96% (!) 88%  Weight:      Height:       Weight change: -2.5 kg  Intake/Output Summary (Last 24 hours) at 10/27/2024 1403 Last data filed at 10/27/2024 1110 Gross per 24 hour  Intake 765.06 ml  Output 1850 ml  Net -1084.94 ml     Exam: General: The patient appears confused with restraints applied in no apparent distress   Lab Results: @LABTEST2 @ Micro Results: Recent Results (from the past 240 hours)  Surgical pcr screen     Status: None   Collection Time: 10/20/24  9:20 AM   Specimen: Nasal Mucosa; Nasal Swab  Result Value Ref Range Status   MRSA, PCR NEGATIVE NEGATIVE Final   Staphylococcus aureus NEGATIVE NEGATIVE Final    Comment: (NOTE) The Xpert SA Assay (FDA approved for NASAL specimens in patients 27 years of age and older), is one component of a comprehensive surveillance program. It is not intended to diagnose infection nor to guide or monitor treatment. Performed at [facility], [address] [place], [place] [address]   MRSA Next Gen by PCR, Nasal     Status: None   Collection Time: 10/21/24  4:26 PM   Specimen: Nasal Mucosa; Nasal Swab  Result Value Ref Range Status   MRSA by PCR Next Gen NOT DETECTED NOT  DETECTED Final    Comment: (NOTE) The GeneXpert MRSA Assay (FDA approved for NASAL specimens only), is one component of a comprehensive MRSA colonization surveillance program. It is not intended to diagnose MRSA infection nor to guide or monitor treatment for MRSA infections. Test performance is not FDA approved in patients less than 50 years old. Performed at [facility], [address] [place], [place] [address]   MRSA Next Gen by PCR, Nasal     Status: None   Collection Time: 10/24/24  1:10 PM   Specimen: Nasal Mucosa; Nasal Swab  Result Value Ref Range Status   MRSA by PCR Next Gen NOT DETECTED NOT DETECTED Final    Comment: (NOTE) The GeneXpert MRSA Assay (FDA approved for NASAL specimens only), is one component of a comprehensive MRSA colonization surveillance program. It is not intended to diagnose MRSA infection nor to guide or monitor treatment for MRSA infections. Test performance is not FDA approved in patients less than 67 years old. Performed at [facility], [address] [place], [place] [address]    Studies/Results: [place] Abd Portable 1V Result Date: 10/27/2024 CLINICAL DATA:  Feeding tube placement. EXAM: PORTABLE ABDOMEN - 1 VIEW COMPARISON:  Recent CTA. FINDINGS: Tip of the weighted enteric tube to the right of midline in the region of  the distal stomach. Embolization coils in the left upper quadrant. Aortic stent graft in place. IMPRESSION: Tip of the weighted enteric tube to the right of midline in the region of the distal stomach. Electronically Signed   By: Andrea Gasman M.D.   On: 10/27/2024 12:36   Medications: I have reviewed the patient's current medications. Scheduled Meds:  Chlorhexidine  Gluconate Cloth  6 each Topical Daily   ipratropium-albuterol  3 mL Nebulization Q4H   mouth rinse  15 mL Mouth Rinse Q2H   pantoprazole (PROTONIX) IV  40 mg Intravenous Q12H   Continuous Infusions:  dexmedetomidine (PRECEDEX) IV infusion 0.6  mcg/kg/hr (10/27/24 1252)   fentaNYL  infusion INTRAVENOUS Stopped (10/27/24 1110)   propofol  (DIPRIVAN ) infusion Stopped (10/27/24 1115)   PRN Meds:.acetaminophen  **OR** acetaminophen , Influenza vac split trivalent PF, ondansetron  **OR** ondansetron  (ZOFRAN ) IV, mouth rinse, polyethylene glycol   Assessment: Principal Problem:   GI bleeding Active Problems:   GERD (gastroesophageal reflux disease)   H/O stroke without residual deficits   Hypercholesterolemia   AAA (abdominal aortic aneurysm) without rupture   Acute upper GI bleed    Plan: This patient has had 2 upper endoscopies without a source of the bleeding seen with a stable hemoglobin at this time.  We are not planning any endoscopic intervention at this time and if he should have rebleeding we may want to repeat the CT angiography to see if repeat embolization is an option.  I have discussed my plan with the wife.  Will follow along with you.   LOS: 3 days   Rogelia Copping, MD.FACG 10/27/2024, 2:03 PM Pager [phone] 7am-5pm  Check AMION for 5pm -7am coverage and on weekends

## 2024-10-27 NOTE — Care Management Important Message (Signed)
 Important Message  Patient Details  Name: Albert [name] MRN: [id number] Date of Birth: 01-30-1946   Important Message Given:  Yes - Medicare IM     Albert [name] 10/27/2024, 10:07 AM

## 2024-10-27 NOTE — Progress Notes (Addendum)
 Nutrition Follow Up Note   DOCUMENTATION CODES:   Non-severe (moderate) malnutrition in context of social or environmental circumstances  INTERVENTION:   Osmolite 1.5@65ml /hr- Initiate at 32ml/hr and increase by 10ml/hr q 8 hours until goal rate is reached.   ProSource TF 20- Give 60ml daily via tube, each supplement provides 80kcal and 20g of protein.   Free water  flushes q4 hours  Regimen provides 2420kcal/day, 118g/day protein and 2089ml/day of free water .  Vitamin C 500mg  BID via tube   Thiamine 100mg  daily via tube x 7 days   Pt at high refeed risk; recommend monitor potassium, magnesium and phosphorus labs daily until stable  Daily weights   NUTRITION DIAGNOSIS:   Moderate Malnutrition related to social / environmental circumstances as evidenced by moderate fat depletion, moderate muscle depletion, severe muscle depletion. -ongoing   GOAL:   Patient will meet greater than or equal to 90% of their needs -not met   MONITOR:   Diet advancement, Labs, Weight trends, TF tolerance, I & O's, Skin  ASSESSMENT:   78 y/o male with h/o GERD, CVA, HLD, anxiety, dementia, diverticulosis, kidney stones, Barrett's esophagus, HTN and abdominal aortic aneurysm s/p recent repair 11/5 who is admitted with  hematemesis s/p coil embolization of of gastroepiploic artery on 11/8 and AKI.  Pt extubated today. Dobhoff tube placed (gastric). Will plan to initiate tube feeds. Pt is at high refeed risk. Pt with mild hypernatremia; will add free water . Per chart, pt appears to be at his UBW currently.   Medications reviewed and include: dexamethasone , protonix, precedex  Labs reviewed: Na 146(H), K 3.7 wnl, BUN 75(H), creat 1.47(H), P 4.2 wnl Mg 2.2 wnl- 11/10 Hgb 7.6(L), Hct 23.8(L) Cbgs- 105, 101, 172, 67, 106, 105 x 24 hrs   UOP-   Diet Order:   Diet Order             Diet NPO time specified  Diet effective now                  EDUCATION NEEDS:   No  education needs have been identified at this time  Skin:  Skin Assessment: Reviewed RN Assessment (ecchymosis)  Last BM:  11/9- TYPE 6  Height:   Ht Readings from Last 1 Encounters:  10/24/24 6' 1 (1.854 m)    Weight:   Wt Readings from Last 1 Encounters:  10/27/24 89.2 kg    Ideal Body Weight:  83.6 kg  BMI:  Body mass index is 25.94 kg/m.  Estimated Nutritional Needs:   Kcal:  2200-2500kcal/day  Protein:  110-125g/day  Fluid:  2.2-2.5L/day  Augustin Shams MS, RD, LDN If unable to be reached, please send secure chat to RD inpatient available from 8:00a-4:00p daily

## 2024-10-27 NOTE — Plan of Care (Signed)
  Problem: Clinical Measurements: Goal: Diagnostic test results will improve Outcome: Progressing Goal: Respiratory complications will improve Outcome: Progressing Goal: Cardiovascular complication will be avoided Outcome: Progressing   Problem: Coping: Goal: Level of anxiety will decrease Outcome: Progressing   Problem: Elimination: Goal: Will not experience complications related to urinary retention Outcome: Progressing   Problem: Pain Managment: Goal: General experience of comfort will improve and/or be controlled Outcome: Progressing

## 2024-10-27 NOTE — IPAL (Signed)
  Interdisciplinary Goals of Care Family Meeting   Date carried out: 10/27/2024  Location of the meeting: Conference room  Member's involved: Physician and Family Member or next of kin  Durable Power of Attorney or acting medical decision maker: Wife, Ziair Penson and patient's two sons.    Discussion: We discussed goals of care for [facility]. Discussed overall clinical status with GI bleed and respiratory failure and extubation earlier today. [name] explained that patient has underlying dementia, with very poor functional status. We went over the course of events today, including SBT, extubation, and currently agitation and poor respiratory status. He is at high risk for respiratory failure and understand gravity of situation. Explained options for escalation of care. Family request full scope of care but request that he not be intubated or resuscitated.  Code status:   Code Status: Limited: Do not attempt resuscitation (DNR) -DNR-LIMITED -Do Not Intubate/DNI    Disposition: Continue current acute care  Time spent for the meeting: 20 minutes    Belva November, MD  10/27/2024, 4:30 PM

## 2024-10-27 NOTE — Progress Notes (Signed)
 NAME:  Albert [name], MRN:  [id number], DOB:  02-28-1946, LOS: 3 ADMISSION DATE:  10/24/2024, CONSULTATION DATE:  10/24/2024 REFERRING MD:  Miki, CHIEF COMPLAINT:  Acute GI Bleed    Brief Pt Description / Synopsis:  78 y.o. male with PMHx significant for AAA with recent endovascular stent grafting on October 21 2024, who is admitted with Acute Upper GI Bleed.  EGD failed to localize the source of bleeding, ultimately requiring coil embolization of the mid-segment of the gastroepiploic artery along the greater curvature of the stomach.  Repeat EGD showed no evidence of underlying lesion or continued bleeding.   History of Present Illness:  Albert [name] is a 78 year old male with a history of dementia, Barrett's esophagus chronic pain syndrome, history of CVA, abdominal aortic aneurysm status post endograft few days ago on October 21, 2024 who came in today with hematemesis.  Patient's wife reported syncopal episode and feelings of malaise subsequently started having melanotic stools and emesis with blood.  Patient was apparently on antiplatelet therapy as well as NSAIDs at home.  He did have CTA performed which showed active extravasation of gastric lumen.  His blood work showed a decrement in hemoglobin from 10.9-7.8. He did have order for 1 unit blood transfusion which is in process of being infused at this moment.  Vascular surgery evaluated patient and did not feel that bleeding was related to prior surgery.  GI evaluation was performed and gastroenterologist attempted EGD with findings of large clotted blood in stomach.  He was unable to finish procedure and requested patient to be transferred to intensive care unit.  Further interventional radiology was contacted for possible embolization and will attempt to evaluate patient today.  Patient received IV Protonix.  Patient is on resuscitative IV fluids.  He is not currently on vasopressor support but he did receive 1 dose of Neo-Synephrine  earlier today due to transient hypotension.  ICU admission is requested due to upper GI bleeding.   PCCM asked to admit for further workup and treatment.  Please see Significant Hospital Events section below for full detailed hospital course.   Pertinent  Medical History   Past Medical History:  Diagnosis Date   Anemia    Anxiety    a.) on BZO (alproazolam) PRN   Arthritis of both knees    Back pain    Barrett esophagus    Chronic back pain    Dementia (HCC)    arising in the senium and presenium   Displaced fracture of proximal phalanx of right lesser toe(s), initial encounter for closed fracture    Diverticulosis    GERD (gastroesophageal reflux disease)    OTC meds as needed   Headache    History of colon polyps    benign    History of kidney stones    History of migraine    Hyperlipidemia    Hypertension    Insomnia    Joint pain    Nephrolithiasis    Pre-diabetes    Stroke (HCC)    without residual deficits    Micro Data:  11/8: MRSA PCR>> negative   Antimicrobials:   Anti-infectives (From admission, onward)    None       Significant Hospital Events: Including procedures, antibiotic start and stop dates in addition to other pertinent events   11/8: Presented to ED with acute GI bleed.  CT angio suggests slow but active bleeding into the gastric lumen along the great curve.  GI consulted, EGD showed large  amount of blood and clot in stomach, but unable to clear blood and clot enough to visualize source of bleeding.  IR consulted and performed coil embolization of the gastroepiploic artery at the greater curvature. 11/9: Repeat EGD again with blood and clot.  General Surgery consulted,  felt surgery would likely be futile and minimal chance of recovery, family declines pursing surgery. Code status changed to DNR/DNI. 11/10: Hgb slowly trending down, remains in low 7's, no obvious bleeding noted per nursing, no plan for additional intervention per GI.  Will  transfuse 1 unit pRBC's and 1 FFP. AKI improving.  On minimal vent support, failed WUA/SBT due to severe agitation.  11/11: No significant events overnight.  Afebrile, hemodynamically stable, not requiring vasopressors.  Hgb stable overnight, no additional blood products required.  On minimal vent support, plan for WUA/SBT as tolerated utilizing Precedex once family arrives, diurese with 40 mg IV Lasix x1 dose prior to extubation ~ EXTUBATED to nasal cannula but remains encephalopathic and continues to require precedex.  Interim History / Subjective:  As outlined above under Significant Hospital Events section  Objective   Blood pressure 136/64, pulse 78, temperature 98.1 F (36.7 C), resp. rate 13, height 6' 1 (1.854 m), weight 89.2 kg, SpO2 99%.    Vent Mode: PRVC FiO2 (%):  [40 %-50 %] 50 % Set Rate:  [16 bmp] 16 bmp Vt Set:  [500 mL] 500 mL PEEP:  [5 cmH20] 5 cmH20 Pressure Support:  [5 cmH20] 5 cmH20 Plateau Pressure:  [15 cmH20] 15 cmH20   Intake/Output Summary (Last 24 hours) at 10/27/2024 0746 Last data filed at 10/27/2024 9271 Gross per 24 hour  Intake 1648.06 ml  Output 1025 ml  Net 623.06 ml   Filed Weights   10/25/24 0404 10/26/24 0500 10/27/24 0500  Weight: 86.6 kg 91.7 kg 89.2 kg    Examination: General: Acute on chronically ill appearing male, laying in bed, intubated and sedated, in NAD HENT: Atraumatic, normocephalic, neck supple, no JVD, orally intubated Lungs: Mechanical breath sounds throughout, even, occasionally overbreathes the vent  Cardiovascular: RRR, s1s2, no M/R/G Abdomen: exam limited due to intubation and sedation: obese, soft, nondistended, no guarding or rebound tenderness Extremities: Normal bulk and tone, no deformities, trace edema BLE Neuro: Sedated, intermittently agitated and unable to redirect, moves all extremities, PERRL GU: Foley catheter in place draining yellow urine   Resolved Hospital Problem list     Assessment & Plan:    #Acute Upper GI Bleed, status post coil embolization of of gastroepiploic artery on 11/8, and EGD x2 with no underlying lesion found or continued bleeding noted #Acute Blood Loss Anemia -Monitor for S/Sx of bleeding -Trend CBC (H&H q6h) -SCD's  for VTE Prophylaxis  -Transfuse for Hgb <7 -Transfuse Platelets for Platelet count < 10K; < 50K with active bleeding; < 100K with Neurosurgical procedures/processes  -Continue PPI BID -GI following, appreciate input -General Surgery consulted ~ felt that subtotal gastrectomy would likely be futile given his current critical illness superimposed chronic co morbidities, while adding pain and suffering ~  family elected not to pursue surgery   #Post Procedure Ventilator Management -Full vent support, implement lung protective strategies -Plateau pressures less than 30 cm H20 -Wean FiO2 & PEEP as tolerated to maintain O2 sats >92% -Follow intermittent Chest X-ray & ABG as needed -Spontaneous Breathing Trials when respiratory parameters met and mental status permits -Implement VAP Bundle -Prn Bronchodilators -Will give 40 mg IV Lasix on 11/11 prior to SBT  #AKI -Monitor I&O's /  urinary output -Follow BMP -Ensure adequate renal perfusion -Avoid nephrotoxic agents as able -Replace electrolytes as indicated ~ Pharmacy following for assistance with electrolyte replacement  #Acute Metabolic Encephalopathy #Sedation needs in setting of mechanical ventilation PMHx: Dementia, migraines -Maintain a RASS goal of 0 to -1 -Propofol  and Fentanyl  to maintain RASS goal -Avoid sedating medications as able -Daily wake up assessment ~ utilize Precedex for WUA/SBT         Pt is critically ill with multiorgan failure. Prognosis is guarded, high risk for further decompensation, cardiac arrest, and death.  Given current critical illness superimposed on multiple chronic co-morbidities and advanced age, overall long term prognosis is poor.  Pt is DNR/DNI  status.  Depending on clinical course, may consult Palliative Care to assist with GOC discussions.   Best Practice (right click and Reselect all SmartList Selections daily)   Diet/type: NPO DVT prophylaxis: SCD GI prophylaxis: PPI Lines: Central line and yes and it is still needed Foley:  Yes, and it is still needed Code Status:  DNR Last date of multidisciplinary goals of care discussion [11/11]  11/11: Pt's wife and sons updated at bedside on plan of care.  Labs   CBC: Recent Labs  Lab 10/24/24 1217 10/24/24 2115 10/25/24 0309 10/25/24 1034 10/26/24 0423 10/26/24 0755 10/26/24 1619 10/26/24 2355 10/27/24 0359  WBC 11.0* 13.9* 16.4*  --   --  8.2  --   --  9.4  HGB 6.9* 8.5* 8.3*   < > 7.1* 7.3* 7.7* 8.0* 7.9*  HCT 21.7* 26.0* 25.7*   < > 22.3* 22.6* 23.8* 24.7* 24.8*  MCV 92.3 90.9 91.5  --   --  90.4  --   --  90.2  PLT 149* 152 174  --   --  150  --   --  142*   < > = values in this interval not displayed.    Basic Metabolic Panel: Recent Labs  Lab 10/22/24 0614 10/24/24 0731 10/25/24 0309 10/26/24 0423 10/27/24 0359  NA 141 140 143 145 146*  K 3.7 3.5 4.7 4.0 3.7  CL 106 106 111 112* 115*  CO2 24 21* 20* 21* 23  GLUCOSE 126* 143* 152* 140* 112*  BUN 21 39* 57* 79* 75*  CREATININE 1.11 1.14 2.08* 1.76* 1.47*  CALCIUM  8.3* 8.2* 7.2* 7.2* 7.7*  MG  --   --  2.2 2.2  --   PHOS  --   --  6.1*  --  4.2   GFR: Estimated Creatinine Clearance: 47.6 mL/min (A) (by C-G formula based on SCr of 1.47 mg/dL (H)). Recent Labs  Lab 10/24/24 2115 10/25/24 0309 10/26/24 0755 10/27/24 0359  WBC 13.9* 16.4* 8.2 9.4    Liver Function Tests: Recent Labs  Lab 10/24/24 0731 10/25/24 0309 10/27/24 0359  AST 19 14*  --   ALT 11 11  --   ALKPHOS 61 51  --   BILITOT 0.7 0.4  --   PROT 6.3* 5.6*  --   ALBUMIN 3.0* 2.6* 2.4*   No results for input(s): LIPASE, AMYLASE in the last 168 hours. No results for input(s): AMMONIA in the last 168 hours.  ABG     Component Value Date/Time   PHART 7.33 (L) 10/24/2024 1411   PCO2ART 41 10/24/2024 1411   PO2ART 57 (L) 10/24/2024 1411   HCO3 21.6 10/24/2024 1411   ACIDBASEDEF 4.1 (H) 10/24/2024 1411   O2SAT 89.5 10/24/2024 1411     Coagulation Profile: Recent Labs  Lab 10/24/24  9268 10/25/24 0309  INR 1.2 1.2    Cardiac Enzymes: No results for input(s): CKTOTAL, CKMB, CKMBINDEX, TROPONINI in the last 168 hours.  HbA1C: Hgb A1c MFr Bld  Date/Time Value Ref Range Status  06/24/2024 05:29 AM 5.2 4.8 - 5.6 % Final    Comment:    (NOTE)         Prediabetes: 5.7 - 6.4         Diabetes: >6.4         Glycemic control for adults with diabetes: <7.0     CBG: Recent Labs  Lab 10/26/24 1959 10/27/24 0002 10/27/24 0336 10/27/24 0405 10/27/24 0432  GLUCAP 108* 105* 106* 67* 172*    Review of Systems:   Unable to assess due to intubation/sedation/critical illness   Past Medical History:  He,  has a past medical history of Anemia, Anxiety, Arthritis of both knees, Back pain, Barrett esophagus, Chronic back pain, Dementia (HCC), Displaced fracture of proximal phalanx of right lesser toe(s), initial encounter for closed fracture, Diverticulosis, GERD (gastroesophageal reflux disease), Headache, History of colon polyps, History of kidney stones, History of migraine, Hyperlipidemia, Hypertension, Insomnia, Joint pain, Nephrolithiasis, Pre-diabetes, and Stroke (HCC).   Surgical History:   Past Surgical History:  Procedure Laterality Date   AMPUTATION TOE Right 04/13/2022   Procedure: 28820 - AMPUTATION TOE;  Surgeon: Ashley Soulier, DPM;  Location: [facility] ORS;  Service: Podiatry;  Laterality: Right;   ANKLE ARTHROSCOPY Right 04/13/2022   Procedure: A-SCOPE/OCD REPAIR;  Surgeon: Ashley Soulier, DPM;  Location: [facility] ORS;  Service: Podiatry;  Laterality: Right;   ANKLE SURGERY Right 1997   ankles/screws    ARTHROSCOPIC REPAIR ACL Right 1993   BACK SURGERY  2017   titanium rods and pins  in lower back   COLONOSCOPY     ENDOVASCULAR STENT GRAFT (AAA) N/A 10/21/2024   Procedure: ENDOVASCULAR STENT GRAFT (AAA);  Surgeon: Marea Selinda RAMAN, MD;  Location: [facility] IN[facility];  Service: Cardiovascular;  Laterality: N/A;   EPIDIDYMECTOMY Left 04/10/2017   Procedure: EPIDIDYMECTOMY;  Surgeon: Rosina Riis, MD;  Location: [facility] ORS;  Service: Urology;  Laterality: Left;   ESOPHAGOGASTRODUODENOSCOPY     ESOPHAGOGASTRODUODENOSCOPY N/A 10/24/2024   Procedure: EGD (ESOPHAGOGASTRODUODENOSCOPY);  Surgeon: Onita Elspeth Sharper, DO;  Location: [facility];  Service: Gastroenterology;  Laterality: N/A;   ESOPHAGOGASTRODUODENOSCOPY N/A 10/25/2024   Procedure: EGD (ESOPHAGOGASTRODUODENOSCOPY);  Surgeon: Onita Elspeth Sharper, DO;  Location: [facility] ENDOSCOPY;  Service: Gastroenterology;  Laterality: N/A;  in ICU   IR EMBO ART  VEN HEMORR LYMPH EXTRAV  INC GUIDE ROADMAPPING  10/24/2024   LUMBAR FUSION  March 13, 2016   L4/L5 fusion   MINOR HARDWARE REMOVAL Right 04/13/2022   Procedure: REMOVAL SCREW; DEEP;  Surgeon: Ashley Soulier, DPM;  Location: [facility] ORS;  Service: Podiatry;  Laterality: Right;   TONSILLECTOMY  1962   adenoidectomy    VASECTOMY  1973   WOUND DEBRIDEMENT Right 04/13/2022   Procedure: 29906 - SUBTALAR ARTHROSCOPY;  Surgeon: Ashley Soulier, DPM;  Location: [facility] ORS;  Service: Podiatry;  Laterality: Right;     Social History:   reports that he has been smoking cigarettes. He has a 25 pack-year smoking history. He has never used smokeless tobacco. He reports that he does not drink alcohol and does not use drugs.   Family History:  His family history includes Arthritis in his mother; Esophageal cancer in his father. There is no history of Prostate cancer, Bladder Cancer, or Kidney cancer.   Allergies Allergies  Allergen  Reactions   Codeine Nausea And Vomiting     Home Medications  Prior to Admission medications   Medication Sig Start Date End Date Taking? Authorizing Provider   acetaminophen  (TYLENOL ) 325 MG tablet Take 650 mg by mouth every 8 (eight) hours as needed for moderate pain.   Yes [provider]  amLODipine  (NORVASC ) 10 MG tablet Take 10 mg by mouth daily. 08/27/18  Yes [provider]  aspirin  EC 81 MG tablet Take 81 mg by mouth daily.   Yes [provider]  clopidogrel  (PLAVIX ) 75 MG tablet 75 mg daily. 07/29/19  Yes [provider]  cyanocobalamin  (VITAMIN B12) 1000 MCG tablet Take 1,000 mcg by mouth.  Take 1,000 mcg by mouth once daily   Yes [provider]  diphenhydramine-acetaminophen  (TYLENOL  PM) 25-500 MG TABS tablet Take 1 tablet by mouth at bedtime.   Yes [provider]  donepezil  (ARICEPT ) 10 MG tablet Take 10 mg by mouth.  TAKE 1 TABLET(10 MG) BY MOUTH AT BEDTIME 04/23/24  Yes [provider]  DULoxetine  (CYMBALTA ) 60 MG capsule Take 60 mg by mouth daily. 02/25/19 10/25/24 Yes [provider]  ferrous sulfate  325 (65 FE) MG tablet Take 325 mg by mouth.  Take 325 mg by mouth daily with breakfast   Yes [provider]  ibuprofen  (ADVIL ) 400 MG tablet Take 400 mg by mouth every 6 (six) hours as needed.   Yes [provider]  Multiple Vitamin (MULTIVITAMIN WITH MINERALS) TABS tablet Take 1 tablet by mouth daily.   Yes [provider]  naproxen sodium (ALEVE) 220 MG tablet Take 220 mg by mouth daily as needed.   Yes [provider]  QUEtiapine  (SEROQUEL ) 25 MG tablet Take 25 mg by mouth at bedtime. 06/15/24  Yes [provider]  rosuvastatin  (CRESTOR ) 20 MG tablet Take 20 mg by mouth daily. 07/29/19  Yes [provider]  ALPRAZolam  (XANAX ) 0.25 MG tablet Take 0.25 mg by mouth at bedtime as needed for anxiety. Patient not taking: Reported on 10/22/2024 05/01/19   [provider]  naphazoline-pheniramine (NAPHCON-A) 0.025-0.3 % ophthalmic solution Place 1 drop into both eyes 4 (four) times daily as needed for eye  irritation. Patient not taking: Reported on 10/25/2024    [provider]  trolamine salicylate (ASPERCREME) 10 % cream Apply 1 application topically as needed for muscle pain. Patient not taking: Reported on 10/25/2024    [provider]     Critical care time: 40 minutes     Inge Lecher, AGACNP-BC [facility] Prefer epic messenger for cross cover needs If after hours, please call E-link

## 2024-10-27 NOTE — Progress Notes (Addendum)
 0800 Wife present  present. 1030 Dobbhoff placed per orders. 1140 After all sedation weaned. Patient extubated to 3 L nasal cannula. Patient extremely agitated even with wife at bedside. Unable to reason or calm patient. Precedex resumed at .4 mcg/kg/hour.  1150 Patient still extremely restless so Precedex increased to 0.6 mcg/kg/hour. Patient calmer and oxygen placed back on face at 4L [place]. Patient calmer and manageable- dozing at intervals and not pulling at lines. Mitts remain on patient's hands. Patient unable to voice needs due to his advanced dementia. Wife verifies that patient does have times like this at home. SCDs removed to decrease agitation. MD aware.  1200-1400 Calm now. Bath and bed changed.  1430 Patient becoming more agitated. Precedex adjusted. Lasix given. HHN given, Haldol  and Morphine given. 1500-1610 Patient quiet one minute and thrashing and swinging arms and legs the next. When thrashing and swinging patient must be held down by 4 staff members to keep patient in bed. Sons and wife informed of educated on restraints. MD aware orders received. Given Haldol  and restrained to protect patient and staff. See restraint sheet. 1610 Alternating periods of quiet with periods of screaming and trying to get OOB.Unable to calm patient. Ask MD for prn. MD denied request.     No data to display             No data to display      .

## 2024-10-28 ENCOUNTER — Inpatient Hospital Stay

## 2024-10-28 DIAGNOSIS — E785 Hyperlipidemia, unspecified: Secondary | ICD-10-CM

## 2024-10-28 DIAGNOSIS — I714 Abdominal aortic aneurysm, without rupture, unspecified: Secondary | ICD-10-CM

## 2024-10-28 DIAGNOSIS — K219 Gastro-esophageal reflux disease without esophagitis: Secondary | ICD-10-CM

## 2024-10-28 DIAGNOSIS — N179 Acute kidney failure, unspecified: Secondary | ICD-10-CM | POA: Diagnosis not present

## 2024-10-28 DIAGNOSIS — E440 Moderate protein-calorie malnutrition: Secondary | ICD-10-CM | POA: Insufficient documentation

## 2024-10-28 DIAGNOSIS — F02C11 Dementia in other diseases classified elsewhere, severe, with agitation: Secondary | ICD-10-CM | POA: Diagnosis not present

## 2024-10-28 DIAGNOSIS — K922 Gastrointestinal hemorrhage, unspecified: Secondary | ICD-10-CM

## 2024-10-28 DIAGNOSIS — J9601 Acute respiratory failure with hypoxia: Secondary | ICD-10-CM

## 2024-10-28 LAB — TYPE AND SCREEN
ABO/RH(D): A POS
Antibody Screen: NEGATIVE
Unit division: 0
Unit division: 0
Unit division: 0
Unit division: 0
Unit division: 0
Unit division: 0
Unit division: 0

## 2024-10-28 LAB — RENAL FUNCTION PANEL
Albumin: 2.9 g/dL — ABNORMAL LOW (ref 3.5–5.0)
Anion gap: 13 (ref 5–15)
BUN: 54 mg/dL — ABNORMAL HIGH (ref 8–23)
CO2: 25 mmol/L (ref 22–32)
Calcium: 8.2 mg/dL — ABNORMAL LOW (ref 8.9–10.3)
Chloride: 115 mmol/L — ABNORMAL HIGH (ref 98–111)
Creatinine, Ser: 1.36 mg/dL — ABNORMAL HIGH (ref 0.61–1.24)
GFR, Estimated: 54 mL/min — ABNORMAL LOW (ref >60)
Glucose, Bld: 157 mg/dL — ABNORMAL HIGH (ref 70–99)
Phosphorus: 3 mg/dL (ref 2.5–4.6)
Potassium: 3.2 mmol/L — ABNORMAL LOW (ref 3.5–5.1)
Sodium: 153 mmol/L — ABNORMAL HIGH (ref 135–145)

## 2024-10-28 LAB — BPAM RBC
Blood Product Expiration Date: 202511222359
Blood Product Expiration Date: 202512122359
Blood Product Expiration Date: 202512122359
Blood Product Expiration Date: 202512062359
Blood Product Expiration Date: 202512062359
Blood Product Expiration Date: 202512122359
Blood Product Expiration Date: 202512122359
ISSUE DATE / TIME: 202511100959
ISSUE DATE / TIME: 202511080840
ISSUE DATE / TIME: 202511081323
ISSUE DATE / TIME: 202511081730
ISSUE DATE / TIME: 202511092021
Unit Type and Rh: 600
Unit Type and Rh: 6200
Unit Type and Rh: 6200
Unit Type and Rh: 6200
Unit Type and Rh: 6200
Unit Type and Rh: 6200
Unit Type and Rh: 6200

## 2024-10-28 LAB — CBC
HCT: 23.6 % — ABNORMAL LOW (ref 39.0–52.0)
Hemoglobin: 7.7 g/dL — ABNORMAL LOW (ref 13.0–17.0)
MCH: 29.2 pg (ref 26.0–34.0)
MCHC: 32.6 g/dL (ref 30.0–36.0)
MCV: 89.4 fL (ref 80.0–100.0)
Platelets: 154 K/uL (ref 150–400)
RBC: 2.64 MIL/uL — ABNORMAL LOW (ref 4.22–5.81)
RDW: 15.9 % — ABNORMAL HIGH (ref 11.5–15.5)
WBC: 12.3 K/uL — ABNORMAL HIGH (ref 4.0–10.5)
nRBC: 0.2 % (ref 0.0–0.2)

## 2024-10-28 LAB — CBC WITH DIFFERENTIAL/PLATELET
Abs Immature Granulocytes: 0.1 K/uL — ABNORMAL HIGH (ref 0.00–0.07)
Basophils Absolute: 0 K/uL (ref 0.0–0.1)
Basophils Relative: 0 %
Eosinophils Absolute: 0 K/uL (ref 0.0–0.5)
Eosinophils Relative: 0 %
HCT: 24 % — ABNORMAL LOW (ref 39.0–52.0)
Hemoglobin: 7.8 g/dL — ABNORMAL LOW (ref 13.0–17.0)
Immature Granulocytes: 1 %
Lymphocytes Relative: 5 %
Lymphs Abs: 0.6 K/uL — ABNORMAL LOW (ref 0.7–4.0)
MCH: 29.2 pg (ref 26.0–34.0)
MCHC: 32.5 g/dL (ref 30.0–36.0)
MCV: 89.9 fL (ref 80.0–100.0)
Monocytes Absolute: 1 K/uL (ref 0.1–1.0)
Monocytes Relative: 8 %
Neutro Abs: 10.9 K/uL — ABNORMAL HIGH (ref 1.7–7.7)
Neutrophils Relative %: 86 %
Platelets: 188 K/uL (ref 150–400)
RBC: 2.67 MIL/uL — ABNORMAL LOW (ref 4.22–5.81)
RDW: 15.9 % — ABNORMAL HIGH (ref 11.5–15.5)
WBC: 12.5 K/uL — ABNORMAL HIGH (ref 4.0–10.5)
nRBC: 0.2 % (ref 0.0–0.2)

## 2024-10-28 LAB — GLUCOSE, CAPILLARY
Glucose-Capillary: 153 mg/dL — ABNORMAL HIGH (ref 70–99)
Glucose-Capillary: 140 mg/dL — ABNORMAL HIGH (ref 70–99)
Glucose-Capillary: 126 mg/dL — ABNORMAL HIGH (ref 70–99)
Glucose-Capillary: 138 mg/dL — ABNORMAL HIGH (ref 70–99)
Glucose-Capillary: 142 mg/dL — ABNORMAL HIGH (ref 70–99)

## 2024-10-28 LAB — MAGNESIUM: Magnesium: 2.8 mg/dL — ABNORMAL HIGH (ref 1.7–2.4)

## 2024-10-28 LAB — PREPARE RBC (CROSSMATCH)

## 2024-10-28 LAB — TROPONIN T, HIGH SENSITIVITY
Troponin T High Sensitivity: 114 ng/L (ref 0–19)
Troponin T High Sensitivity: 148 ng/L (ref 0–19)
Troponin T High Sensitivity: 135 ng/L (ref 0–19)

## 2024-10-28 MED ORDER — DONEPEZIL HCL 5 MG PO TABS
10.0000 mg | ORAL_TABLET | Freq: Every day | ORAL | Status: DC
  Administered 2024-10-28 – 2024-11-07 (×11): 10 mg
  Filled 2024-10-28 (×11): qty 2

## 2024-10-28 MED ORDER — HALOPERIDOL LACTATE 5 MG/ML IJ SOLN
5.0000 mg | Freq: Once | INTRAMUSCULAR | Status: AC
  Administered 2024-10-28: 5 mg via INTRAVENOUS
  Filled 2024-10-28: qty 1

## 2024-10-28 MED ORDER — THIAMINE MONONITRATE 100 MG PO TABS
100.0000 mg | ORAL_TABLET | Freq: Every day | ORAL | Status: AC
  Administered 2024-10-28 – 2024-11-03 (×7): 100 mg
  Filled 2024-10-28 (×7): qty 1

## 2024-10-28 MED ORDER — FREE WATER
200.0000 mL | Status: DC
  Administered 2024-10-28 – 2024-11-03 (×69): 200 mL

## 2024-10-28 MED ORDER — ACETAMINOPHEN 10 MG/ML IV SOLN
1000.0000 mg | Freq: Once | INTRAVENOUS | Status: AC
  Administered 2024-10-28: 1000 mg via INTRAVENOUS
  Filled 2024-10-28: qty 100

## 2024-10-28 MED ORDER — POTASSIUM CHLORIDE 20 MEQ PO PACK
40.0000 meq | PACK | Freq: Two times a day (BID) | ORAL | Status: AC
  Administered 2024-10-28 (×2): 40 meq
  Filled 2024-10-28 (×2): qty 2

## 2024-10-28 MED ORDER — QUETIAPINE FUMARATE 25 MG PO TABS
25.0000 mg | ORAL_TABLET | Freq: Two times a day (BID) | ORAL | Status: DC
  Administered 2024-10-28 – 2024-10-29 (×3): 25 mg
  Filled 2024-10-28 (×3): qty 1

## 2024-10-28 MED ORDER — ACETAMINOPHEN 10 MG/ML IV SOLN
1000.0000 mg | Freq: Four times a day (QID) | INTRAVENOUS | Status: AC
  Administered 2024-10-28 – 2024-10-29 (×4): 1000 mg via INTRAVENOUS
  Filled 2024-10-28 (×4): qty 100

## 2024-10-28 NOTE — Progress Notes (Signed)
 The patient's hemoglobin has been stable and he has undergone 2 EGDs without a source of bleeding seen and embolization by interventional radiology. The patient remains in the ICU.  At this time there is nothing further to do from a GI point of view.  I will sign off.  Please call if any further GI concerns or questions.  We would like to thank you for the opportunity to participate in the care of Albert [name].[name]

## 2024-10-28 NOTE — Progress Notes (Signed)
 [name] with Lab called with a critical Troponin 114. Made ICU providers and RN aware.

## 2024-10-28 NOTE — Progress Notes (Signed)
 NAME:  Albert [name], MRN:  [id number], DOB:  1946/12/05, LOS: 4 ADMISSION DATE:  10/24/2024, CONSULTATION DATE: 10/28/2024 REFERRING MD: Dr. Roann, CHIEF COMPLAINT: Acute GI Bleed   History of Present Illness:  Albert [name] is a 78 year old male with a history of dementia, Barrett's esophagus chronic pain syndrome, history of CVA, abdominal aortic aneurysm status post endograft few days ago on October 21, 2024 who came in today with hematemesis.  Patient's wife reported syncopal episode and feelings of malaise subsequently started having melanotic stools and emesis with blood.  Patient was apparently on antiplatelet therapy as well as NSAIDs at home.  He did have CTA performed which showed active extravasation of gastric lumen.  His blood work showed a decrement in hemoglobin from 10.9-7.8. He did have order for 1 unit blood transfusion which is in process of being infused at this moment.  Vascular surgery evaluated patient and did not feel that bleeding was related to prior surgery.  GI evaluation was performed and gastroenterologist attempted EGD with findings of large clotted blood in stomach.  He was unable to finish procedure and requested patient to be transferred to intensive care unit.  Further interventional radiology was contacted for possible embolization and will attempt to evaluate patient today.  Patient received IV Protonix.  Patient is on resuscitative IV fluids.  He is not currently on vasopressor support but he did receive 1 dose of Neo-Synephrine earlier today due to transient hypotension.  ICU admission is requested due to upper GI bleeding.    PCCM asked to admit for further workup and treatment.   Please see Significant Hospital Events section below for full detailed hospital course.  Pertinent  Medical History   Past Medical History:  Diagnosis Date   Anemia    Anxiety    a.) on BZO (alproazolam) PRN   Arthritis of both knees    Back pain    Barrett esophagus     Chronic back pain    Dementia (HCC)    arising in the senium and presenium   Displaced fracture of proximal phalanx of right lesser toe(s), initial encounter for closed fracture    Diverticulosis    GERD (gastroesophageal reflux disease)    OTC meds as needed   Headache    History of colon polyps    benign    History of kidney stones    History of migraine    Hyperlipidemia    Hypertension    Insomnia    Joint pain    Nephrolithiasis    Pre-diabetes    Stroke [facility])    without residual deficits   Micro Data:   11/8: MRSA PCR>> negative   Significant Hospital Events: Including procedures, antibiotic start and stop dates in addition to other pertinent events   11/8: Presented to ED with acute GI bleed.  CT angio suggests slow but active bleeding into the gastric lumen along the great curve.  GI consulted, EGD showed large amount of blood and clot in stomach, but unable to clear blood and clot enough to visualize source of bleeding.  IR consulted and performed coil embolization of the gastroepiploic artery at the greater curvature. 11/9: Repeat EGD again with blood and clot.  General Surgery consulted,  felt surgery would likely be futile and minimal chance of recovery, family declines pursing surgery. Code status changed to DNR/DNI. 11/10: Hgb slowly trending down, remains in low 7's, no obvious bleeding noted per nursing, no plan for additional intervention per GI.  Will transfuse 1 unit pRBC's and 1 FFP. AKI improving.  On minimal vent support, failed WUA/SBT due to severe agitation.  11/11: No significant events overnight.  Afebrile, hemodynamically stable, not requiring vasopressors.  Hgb stable overnight, no additional blood products required.  On minimal vent support, plan for WUA/SBT as tolerated utilizing Precedex once family arrives, diurese with 40 mg IV Lasix x1 dose prior to extubation ~ EXTUBATED to nasal cannula but remains encephalopathic and continues to require precedex.   11/12: Pt continues to have intermittent agitation/delirium requiring 4 point restraints due        to interference with medical treatment and precedex gtt along with prn haldol    Interim History / Subjective:  Pt tolerating removal of 15L via nasal canula O2 sats 93% with no signs of respiratory distress   Objective    Blood pressure 134/67, pulse (!) 59, temperature 98.3 F (36.8 C), resp. rate 14, height 6' 1 (1.854 m), weight 87.2 kg, SpO2 94%.    Vent Mode: PSV;CPAP FiO2 (%):  [40 %] 40 % PEEP:  [5 cmH20] 5 cmH20 Pressure Support:  [5 cmH20] 5 cmH20   Intake/Output Summary (Last 24 hours) at 10/28/2024 0920 Last data filed at 10/28/2024 9470 Gross per 24 hour  Intake 1595.87 ml  Output 6025 ml  Net -4429.13 ml   Filed Weights   10/26/24 0500 10/27/24 0500 10/28/24 0500  Weight: 91.7 kg 89.2 kg 87.2 kg   Examination: General: Acute-chronically-ill appearing male, NAD on RA  HENT: Supple, no JVD  Lungs: Faint rhonchi throughout, even, non labored  Cardiovascular: NSR, s1s2, no m/r/g, 2+ radial/1+ distal pulses, 1+ bilateral upper extremity edema Abdomen: +BS x4, obese, RUQ tenderness, non distended Extremities: Normal bulk and tone, moves all extremities  Neuro: Awake but confused, not following commands but moving all extremities, PERRLA GU: Indwelling foley catheter draining yellow urine will place orders to remove   Resolved problem list   Assessment and Plan   #Acute metabolic encephalopathy complicated by dementia  #Chronic pain  - Correct metabolic derangements  - Will start scheduled iv tylenol  and lidocaine  patch for pain management  - Will attempt to wean precedex gtt  - Start seroquel  and outpatient aricept ; continue prn haldol   - Will discontinue narcotics  - Maintain sleep/wake cycle - Promote family presence at bedside  - Once stable will consult PT/OT  - Prn soft restraints to prevent interference with care  #HFpEF  #HTN #HLD  Echo 06/25/24:  EF 65 to 70%, grade I diastolic dysfunction  - Continuous telemetry monitoring  - If pt remains hypertensive will resume outpatient amlodipine   - Holding aspirin  and plavix  for now due to recent GI bleed - Holding diuresis for now due to worsening hypernatremia and current net negative 1.4L since admission   #Acute hypoxic respiratory failure #Pulmonary edema  - Supplemental O2 for dyspnea and/or hypoxia  - Maintain O2 sats 92% or higher  - Prn diuresis  - Intermittent CXR - Prn bronchodilator therapy   #Acute kidney injury  #Hypokalemia  #Hypernatremia~worsening  - Trend BMP  - Replace electrolytes as indicated  - Strict I&O's  - Avoid nephrotoxic agents as able  - Continue free water  flushes @150  ml q4hrs   #Acute upper GI bleed, status post coil embolization of of gastroepiploic artery on 11/8, and EGD x2 with no underlying lesion found or continued bleeding noted #Acute blood loss anemia - Trend CBC  - Monitor for s/sx of bleeding  - Transfuse for hgb <7 -  Avoid chemical VTE px for now  - PPI q12hrs   #Non-severe (moderate) malnutrition  - Dietitian consulted appreciate input - Continue thiamine and ascorbic acid   #Pre-diabetes  - CBG's q4hrs  - Follow hyper/hypoglycemic protocol  - Target CBG reading 140 to 180    Labs   CBC: Recent Labs  Lab 10/24/24 2115 10/25/24 0309 10/25/24 1034 10/26/24 0755 10/26/24 1619 10/26/24 2355 10/27/24 0359 10/27/24 0931 10/28/24 0421  WBC 13.9* 16.4*  --  8.2  --   --  9.4  --  12.3*  HGB 8.5* 8.3*   < > 7.3* 7.7* 8.0* 7.9* 7.6* 7.7*  HCT 26.0* 25.7*   < > 22.6* 23.8* 24.7* 24.8* 23.8* 23.6*  MCV 90.9 91.5  --  90.4  --   --  90.2  --  89.4  PLT 152 174  --  150  --   --  142*  --  154   < > = values in this interval not displayed.    Basic Metabolic Panel: Recent Labs  Lab 10/24/24 0731 10/25/24 0309 10/26/24 0423 10/27/24 0359 10/28/24 0421  NA 140 143 145 146* 153*  K 3.5 4.7 4.0 3.7 3.2*  CL 106 111  112* 115* 115*  CO2 21* 20* 21* 23 25  GLUCOSE 143* 152* 140* 112* 157*  BUN 39* 57* 79* 75* 54*  CREATININE 1.14 2.08* 1.76* 1.47* 1.36*  CALCIUM  8.2* 7.2* 7.2* 7.7* 8.2*  MG  --  2.2 2.2  --  2.8*  PHOS  --  6.1*  --  4.2 3.0   GFR: Estimated Creatinine Clearance: 51.4 mL/min (A) (by C-G formula based on SCr of 1.36 mg/dL (H)). Recent Labs  Lab 10/25/24 0309 10/26/24 0755 10/27/24 0359 10/28/24 0421  WBC 16.4* 8.2 9.4 12.3*    Liver Function Tests: Recent Labs  Lab 10/24/24 0731 10/25/24 0309 10/27/24 0359 10/28/24 0421  AST 19 14*  --   --   ALT 11 11  --   --   ALKPHOS 61 51  --   --   BILITOT 0.7 0.4  --   --   PROT 6.3* 5.6*  --   --   ALBUMIN 3.0* 2.6* 2.4* 2.9*   No results for input(s): LIPASE, AMYLASE in the last 168 hours. No results for input(s): AMMONIA in the last 168 hours.  ABG    Component Value Date/Time   PHART 7.33 (L) 10/24/2024 1411   PCO2ART 41 10/24/2024 1411   PO2ART 57 (L) 10/24/2024 1411   HCO3 21.6 10/24/2024 1411   ACIDBASEDEF 4.1 (H) 10/24/2024 1411   O2SAT 89.5 10/24/2024 1411     Coagulation Profile: Recent Labs  Lab 10/24/24 0731 10/25/24 0309  INR 1.2 1.2    Cardiac Enzymes: No results for input(s): CKTOTAL, CKMB, CKMBINDEX, TROPONINI in the last 168 hours.  HbA1C: Hgb A1c MFr Bld  Date/Time Value Ref Range Status  06/24/2024 05:29 AM 5.2 4.8 - 5.6 % Final    Comment:    (NOTE)         Prediabetes: 5.7 - 6.4         Diabetes: >6.4         Glycemic control for adults with diabetes: <7.0     CBG: Recent Labs  Lab 10/27/24 1526 10/27/24 1929 10/27/24 2324 10/28/24 0405 10/28/24 0756  GLUCAP 133* 163* 185* 153* 140*    Review of Systems:   Unable to assess pt with acute encephalopathy   Past Medical  History:  He,  has a past medical history of Anemia, Anxiety, Arthritis of both knees, Back pain, Barrett esophagus, Chronic back pain, Dementia (HCC), Displaced fracture of proximal phalanx  of right lesser toe(s), initial encounter for closed fracture, Diverticulosis, GERD (gastroesophageal reflux disease), Headache, History of colon polyps, History of kidney stones, History of migraine, Hyperlipidemia, Hypertension, Insomnia, Joint pain, Nephrolithiasis, Pre-diabetes, and Stroke (HCC).   Surgical History:   Past Surgical History:  Procedure Laterality Date   AMPUTATION TOE Right 04/13/2022   Procedure: 28820 - AMPUTATION TOE;  Surgeon: Ashley Soulier, DPM;  Location: [facility] ORS;  Service: Podiatry;  Laterality: Right;   ANKLE ARTHROSCOPY Right 04/13/2022   Procedure: A-SCOPE/OCD REPAIR;  Surgeon: Ashley Soulier, DPM;  Location: [facility] ORS;  Service: Podiatry;  Laterality: Right;   ANKLE SURGERY Right 1997   ankles/screws    ARTHROSCOPIC REPAIR ACL Right 1993   BACK SURGERY  2017   titanium rods and pins in lower back   COLONOSCOPY     ENDOVASCULAR STENT GRAFT (AAA) N/A 10/21/2024   Procedure: ENDOVASCULAR STENT GRAFT (AAA);  Surgeon: Marea Selinda RAMAN, MD;  Location: [facility] INVASIVE CV LAB;  Service: Cardiovascular;  Laterality: N/A;   EPIDIDYMECTOMY Left 04/10/2017   Procedure: EPIDIDYMECTOMY;  Surgeon: Rosina Riis, MD;  Location: [facility] ORS;  Service: Urology;  Laterality: Left;   ESOPHAGOGASTRODUODENOSCOPY     ESOPHAGOGASTRODUODENOSCOPY N/A 10/24/2024   Procedure: EGD (ESOPHAGOGASTRODUODENOSCOPY);  Surgeon: Onita Elspeth Sharper, DO;  Location: [facility] ENDOSCOPY;  Service: Gastroenterology;  Laterality: N/A;   ESOPHAGOGASTRODUODENOSCOPY N/A 10/25/2024   Procedure: EGD (ESOPHAGOGASTRODUODENOSCOPY);  Surgeon: Onita Elspeth Sharper, DO;  Location[facility];  Service: Gastroenterology;  Laterality: N/A;  in ICU   IR EMBO ART  VEN HEMORR LYMPH EXTRAV  INC GUIDE ROADMAPPING  10/24/2024   LUMBAR FUSION  March 13, 2016   L4/L5 fusion   MINOR HARDWARE REMOVAL Right 04/13/2022   Procedure: REMOVAL SCREW; DEEP;  Surgeon: Ashley Soulier, DPM;  Location: [facility] ORS;  Service: Podiatry;  Laterality:  Right;   TONSILLECTOMY  1962   adenoidectomy    VASECTOMY  1973   WOUND DEBRIDEMENT Right 04/13/2022   Procedure: 29906 - SUBTALAR ARTHROSCOPY;  Surgeon: Ashley Soulier, DPM;  Location: [facility] ORS;  Service: Podiatry;  Laterality: Right;     Social History:   reports that he has been smoking cigarettes. He has a 25 pack-year smoking history. He has never used smokeless tobacco. He reports that he does not drink alcohol and does not use drugs.   Family History:  His family history includes Arthritis in his mother; Esophageal cancer in his father. There is no history of Prostate cancer, Bladder Cancer, or Kidney cancer.   Allergies Allergies  Allergen Reactions   Codeine Nausea And Vomiting     Home Medications  Prior to Admission medications   Medication Sig Start Date End Date Taking? Authorizing Provider  acetaminophen  (TYLENOL ) 325 MG tablet Take 650 mg by mouth every 8 (eight) hours as needed for moderate pain.   Yes [provider]  amLODipine  (NORVASC ) 10 MG tablet Take 10 mg by mouth daily. 08/27/18  Yes [provider]  aspirin  EC 81 MG tablet Take 81 mg by mouth daily.   Yes [provider]  clopidogrel  (PLAVIX ) 75 MG tablet 75 mg daily. 07/29/19  Yes [provider]  cyanocobalamin  (VITAMIN B12) 1000 MCG tablet Take 1,000 mcg by mouth.  Take 1,000 mcg by mouth once daily   Yes [provider]  diphenhydramine-acetaminophen  (TYLENOL  PM)  25-500 MG TABS tablet Take 1 tablet by mouth at bedtime.   Yes [provider]  donepezil  (ARICEPT ) 10 MG tablet Take 10 mg by mouth.  TAKE 1 TABLET(10 MG) BY MOUTH AT BEDTIME 04/23/24  Yes [provider]  DULoxetine  (CYMBALTA ) 60 MG capsule Take 60 mg by mouth daily. 02/25/19 10/25/24 Yes [provider]  ferrous sulfate  325 (65 FE) MG tablet Take 325 mg by mouth.  Take 325 mg by mouth daily with breakfast   Yes [provider]  ibuprofen  (ADVIL ) 400 MG tablet Take 400 mg  by mouth every 6 (six) hours as needed.   Yes [provider]  Multiple Vitamin (MULTIVITAMIN WITH MINERALS) TABS tablet Take 1 tablet by mouth daily.   Yes [provider]  naproxen sodium (ALEVE) 220 MG tablet Take 220 mg by mouth daily as needed.   Yes [provider]  QUEtiapine  (SEROQUEL ) 25 MG tablet Take 25 mg by mouth at bedtime. 06/15/24  Yes [provider]  rosuvastatin  (CRESTOR ) 20 MG tablet Take 20 mg by mouth daily. 07/29/19  Yes [provider]  ALPRAZolam  (XANAX ) 0.25 MG tablet Take 0.25 mg by mouth at bedtime as needed for anxiety. Patient not taking: Reported on 10/22/2024 05/01/19   [provider]  naphazoline-pheniramine (NAPHCON-A) 0.025-0.3 % ophthalmic solution Place 1 drop into both eyes 4 (four) times daily as needed for eye irritation. Patient not taking: Reported on 10/25/2024    [provider]  trolamine salicylate (ASPERCREME) 10 % cream Apply 1 application topically as needed for muscle pain. Patient not taking: Reported on 10/25/2024    [provider]     Critical care time: 50 minutes     Albert [name], AGNP  Pulmonary/Critical Care Pager [phone] (please enter 7 digits) PCCM Consult Pager [phone] (please enter 7 digits)

## 2024-10-28 NOTE — Plan of Care (Signed)
  Problem: Clinical Measurements: Goal: Ability to maintain clinical measurements within normal limits will improve Outcome: Progressing   Problem: Nutrition: Goal: Adequate nutrition will be maintained Outcome: Progressing   

## 2024-10-28 NOTE — Progress Notes (Signed)
 [facility]  Daily Progress Note   Subjective  -   Patient has been extubated.  Hemodynamically stable with no obvious neurologic deficits but remains quite agitated and delirious.  Hemoglobin is remained stable  Objective Vitals:   10/28/24 0415 10/28/24 0500 10/28/24 0600 10/28/24 0700  BP:  (!) 150/65 138/64 134/67  Pulse:  (!) 25 (!) 59   Resp:  14 13 14   Temp:      TempSrc:      SpO2: 97% 100% 94%   Weight:  87.2 kg    Height:        Intake/Output Summary (Last 24 hours) at 10/28/2024 1322 Last data filed at 10/28/2024 1121 Gross per 24 hour  Intake 1895.87 ml  Output 4625 ml  Net -2729.13 ml    PULM  CTAB CV  RRR VASC  his feet are warm.  Access sites are clean, dry, and intact.  Laboratory CBC    Component Value Date/Time   WBC 12.5 (H) 10/28/2024 1115   HGB 7.8 (L) 10/28/2024 1115   HGB 12.6 (L) 09/05/2014 1506   HCT 24.0 (L) 10/28/2024 1115   HCT 39.0 (L) 09/05/2014 1506   PLT 188 10/28/2024 1115   PLT 236 09/05/2014 1506    BMET    Component Value Date/Time   NA 153 (H) 10/28/2024 0421   NA 140 09/05/2014 1506   K 3.2 (L) 10/28/2024 0421   K 3.7 09/05/2014 1506   CL 115 (H) 10/28/2024 0421   CL 107 09/05/2014 1506   CO2 25 10/28/2024 0421   CO2 26 09/05/2014 1506   GLUCOSE 157 (H) 10/28/2024 0421   GLUCOSE 125 (H) 09/05/2014 1506   BUN 54 (H) 10/28/2024 0421   BUN 23 (H) 09/05/2014 1506   CREATININE 1.36 (H) 10/28/2024 0421   CREATININE 1.08 09/05/2014 1506   CALCIUM  8.2 (L) 10/28/2024 0421   CALCIUM  9.9 09/05/2014 1506   GFRNONAA 54 (L) 10/28/2024 0421   GFRNONAA >60 09/05/2014 1506   GFRAA >60 03/07/2016 1026   GFRAA >60 09/05/2014 1506    Assessment/Planning: POD #7 s/p endovascular abdominal aortic aneurysm repair  Was readmitted with GI bleed Bleeding has now subsided and his hemoglobin has been stable Mental status remains poor and he has significant agitation and delirium.  Has baseline dementia  underlying.  No focal deficits that would suggest stroke or other acute changes From vascular surgery point of view, would be okay with just resuming a baby aspirin  daily when felt safe by the primary team.   Selinda Gu  10/28/2024, 1:22 PM

## 2024-10-28 NOTE — Progress Notes (Signed)
 PHARMACY CONSULT NOTE  Pharmacy Consult for Electrolyte Monitoring and Replacement   Recent Labs: Potassium (mmol/L)  Date Value  10/28/2024 3.2 (L)  09/05/2014 3.7   Magnesium (mg/dL)  Date Value  88/87/7974 2.8 (H)   Calcium  (mg/dL)  Date Value  88/87/7974 8.2 (L)   Calcium , Total (mg/dL)  Date Value  90/79/7984 9.9   Albumin (g/dL)  Date Value  88/87/7974 2.9 (L)  09/05/2014 3.5   Phosphorus (mg/dL)  Date Value  88/87/7974 3.0   Sodium (mmol/L)  Date Value  10/28/2024 153 (H)  09/05/2014 140   Assessment: 78 y.o. male with medical history significant for chronic dementia, Barrett's esophagus, chronic pain, CVA, AAA s/p recent endograft 10/21/2024 who presented to ED with hematemesis. Pharmacy is asked to follow and replace electrolytes while in CCU  Goal of Therapy:  Electrolytes WNL  Plan:  --Na 153, management per PCCM team. Has been started on tube feeds and free water  flushes yesterday evening --K 3.2, Kcl 40 mEq per tube BID x 2 doses --Follow-up electrolytes with AM labs tomorrow  Marolyn KATHEE Mare 10/28/2024 9:04 AM

## 2024-10-29 DIAGNOSIS — J9601 Acute respiratory failure with hypoxia: Secondary | ICD-10-CM | POA: Diagnosis not present

## 2024-10-29 DIAGNOSIS — F02C11 Dementia in other diseases classified elsewhere, severe, with agitation: Secondary | ICD-10-CM | POA: Diagnosis not present

## 2024-10-29 DIAGNOSIS — K922 Gastrointestinal hemorrhage, unspecified: Secondary | ICD-10-CM | POA: Diagnosis not present

## 2024-10-29 DIAGNOSIS — N179 Acute kidney failure, unspecified: Secondary | ICD-10-CM | POA: Diagnosis not present

## 2024-10-29 LAB — SODIUM: Sodium: 156 mmol/L — ABNORMAL HIGH (ref 135–145)

## 2024-10-29 LAB — RENAL FUNCTION PANEL
Albumin: 2.9 g/dL — ABNORMAL LOW (ref 3.5–5.0)
Anion gap: 8 (ref 5–15)
BUN: 49 mg/dL — ABNORMAL HIGH (ref 8–23)
CO2: 27 mmol/L (ref 22–32)
Calcium: 8.3 mg/dL — ABNORMAL LOW (ref 8.9–10.3)
Chloride: 121 mmol/L — ABNORMAL HIGH (ref 98–111)
Creatinine, Ser: 1.12 mg/dL (ref 0.61–1.24)
GFR, Estimated: >60 mL/min (ref >60)
Glucose, Bld: 140 mg/dL — ABNORMAL HIGH (ref 70–99)
Phosphorus: 1.5 mg/dL — ABNORMAL LOW (ref 2.5–4.6)
Potassium: 3.9 mmol/L (ref 3.5–5.1)
Sodium: 156 mmol/L — ABNORMAL HIGH (ref 135–145)

## 2024-10-29 LAB — GLUCOSE, CAPILLARY
Glucose-Capillary: 119 mg/dL — ABNORMAL HIGH (ref 70–99)
Glucose-Capillary: 127 mg/dL — ABNORMAL HIGH (ref 70–99)
Glucose-Capillary: 128 mg/dL — ABNORMAL HIGH (ref 70–99)
Glucose-Capillary: 135 mg/dL — ABNORMAL HIGH (ref 70–99)
Glucose-Capillary: 135 mg/dL — ABNORMAL HIGH (ref 70–99)
Glucose-Capillary: 141 mg/dL — ABNORMAL HIGH (ref 70–99)

## 2024-10-29 LAB — CBC
HCT: 24 % — ABNORMAL LOW (ref 39.0–52.0)
Hemoglobin: 7.7 g/dL — ABNORMAL LOW (ref 13.0–17.0)
MCH: 28.6 pg (ref 26.0–34.0)
MCHC: 32.1 g/dL (ref 30.0–36.0)
MCV: 89.2 fL (ref 80.0–100.0)
Platelets: 193 K/uL (ref 150–400)
RBC: 2.69 MIL/uL — ABNORMAL LOW (ref 4.22–5.81)
RDW: 15.9 % — ABNORMAL HIGH (ref 11.5–15.5)
WBC: 13.5 K/uL — ABNORMAL HIGH (ref 4.0–10.5)
nRBC: 0.2 % (ref 0.0–0.2)

## 2024-10-29 LAB — HEMOGLOBIN AND HEMATOCRIT, BLOOD
HCT: 27.5 % — ABNORMAL LOW (ref 39.0–52.0)
Hemoglobin: 8.7 g/dL — ABNORMAL LOW (ref 13.0–17.0)

## 2024-10-29 LAB — PHOSPHORUS: Phosphorus: 2.4 mg/dL — ABNORMAL LOW (ref 2.5–4.6)

## 2024-10-29 LAB — MAGNESIUM: Magnesium: 2.8 mg/dL — ABNORMAL HIGH (ref 1.7–2.4)

## 2024-10-29 MED ORDER — CLONIDINE HCL 0.1 MG PO TABS
0.2000 mg | ORAL_TABLET | Freq: Every day | ORAL | Status: AC
  Administered 2024-11-02: 0.2 mg
  Filled 2024-10-29: qty 2

## 2024-10-29 MED ORDER — QUETIAPINE FUMARATE 25 MG PO TABS: 50.0000 mg | ORAL_TABLET | Freq: Every day | ORAL | Status: DC

## 2024-10-29 MED ORDER — AMLODIPINE BESYLATE 10 MG PO TABS
10.0000 mg | ORAL_TABLET | Freq: Every day | ORAL | Status: DC
  Administered 2024-10-29: 10 mg
  Filled 2024-10-29: qty 1

## 2024-10-29 MED ORDER — GUAIFENESIN 100 MG/5ML PO LIQD
5.0000 mL | ORAL | Status: DC | PRN
  Administered 2024-10-31 (×2): 5 mL
  Filled 2024-10-29 (×2): qty 10

## 2024-10-29 MED ORDER — CLONIDINE HCL 0.1 MG PO TABS
0.2000 mg | ORAL_TABLET | Freq: Two times a day (BID) | ORAL | Status: AC
  Administered 2024-11-01 (×2): 0.2 mg
  Filled 2024-10-29 (×2): qty 2

## 2024-10-29 MED ORDER — DEXTROSE-SODIUM CHLORIDE 5-0.45 % IV SOLN: INTRAVENOUS | Status: AC

## 2024-10-29 MED ORDER — METOPROLOL TARTRATE 5 MG/5ML IV SOLN
5.0000 mg | Freq: Once | INTRAVENOUS | Status: AC
  Administered 2024-10-29: 5 mg via INTRAVENOUS
  Filled 2024-10-29: qty 5

## 2024-10-29 MED ORDER — POTASSIUM PHOSPHATES 15 MMOLE/5ML IV SOLN
30.0000 mmol | Freq: Once | INTRAVENOUS | Status: AC
  Administered 2024-10-29: 30 mmol via INTRAVENOUS
  Filled 2024-10-29: qty 10

## 2024-10-29 MED ORDER — QUETIAPINE FUMARATE 25 MG PO TABS
50.0000 mg | ORAL_TABLET | Freq: Every day | ORAL | Status: DC
  Administered 2024-10-29 – 2024-11-04 (×7): 50 mg
  Filled 2024-10-29 (×7): qty 2

## 2024-10-29 MED ORDER — CLONIDINE HCL 0.1 MG PO TABS
0.2000 mg | ORAL_TABLET | Freq: Four times a day (QID) | ORAL | Status: AC
Start: 2024-10-29 — End: 2024-10-31
  Administered 2024-10-29 – 2024-10-30 (×7): 0.2 mg
  Filled 2024-10-29 (×7): qty 2

## 2024-10-29 MED ORDER — HYDROMORPHONE HCL 1 MG/ML IJ SOLN
1.0000 mg | INTRAMUSCULAR | Status: AC | PRN
  Administered 2024-10-29 – 2024-10-30 (×2): 1 mg via INTRAVENOUS
  Filled 2024-10-29 (×2): qty 1

## 2024-10-29 MED ORDER — CLONIDINE HCL 0.1 MG PO TABS
0.2000 mg | ORAL_TABLET | Freq: Three times a day (TID) | ORAL | Status: AC
  Administered 2024-10-31 (×3): 0.2 mg
  Filled 2024-10-29 (×3): qty 2

## 2024-10-29 MED ORDER — QUETIAPINE FUMARATE 25 MG PO TABS
25.0000 mg | ORAL_TABLET | Freq: Every day | ORAL | Status: DC
  Administered 2024-10-30 – 2024-11-05 (×7): 25 mg
  Filled 2024-10-29 (×7): qty 1

## 2024-10-29 MED ORDER — LIDOCAINE 5 % EX PTCH
1.0000 | MEDICATED_PATCH | CUTANEOUS | Status: DC
  Administered 2024-10-29 – 2024-11-10 (×13): 1 via TRANSDERMAL
  Filled 2024-10-29 (×13): qty 1

## 2024-10-29 NOTE — Plan of Care (Signed)
  Problem: Education: Goal: Knowledge of General Education information will improve Description: Including pain rating scale, medication(s)/side effects and non-pharmacologic comfort measures Outcome: Progressing   Problem: Health Behavior/Discharge Planning: Goal: Ability to manage health-related needs will improve Outcome: Progressing   Problem: Clinical Measurements: Goal: Ability to maintain clinical measurements within normal limits will improve Outcome: Progressing Goal: Will remain free from infection Outcome: Progressing Goal: Diagnostic test results will improve Outcome: Progressing Goal: Respiratory complications will improve Outcome: Progressing Goal: Cardiovascular complication will be avoided Outcome: Progressing   Problem: Activity: Goal: Risk for activity intolerance will decrease Outcome: Progressing   Problem: Nutrition: Goal: Adequate nutrition will be maintained Outcome: Progressing   Problem: Coping: Goal: Level of anxiety will decrease Outcome: Progressing   Problem: Elimination: Goal: Will not experience complications related to bowel motility Outcome: Progressing Goal: Will not experience complications related to urinary retention Outcome: Progressing   Problem: Pain Managment: Goal: General experience of comfort will improve and/or be controlled Outcome: Progressing   Problem: Safety: Goal: Ability to remain free from injury will improve Outcome: Progressing   Problem: Skin Integrity: Goal: Risk for impaired skin integrity will decrease Outcome: Progressing   Problem: Education: Goal: Understanding of CV disease, CV risk reduction, and recovery process will improve Outcome: Progressing Goal: Individualized Educational Video(s) Outcome: Progressing   Problem: Activity: Goal: Ability to return to baseline activity level will improve Outcome: Progressing   Problem: Cardiovascular: Goal: Ability to achieve and maintain adequate  cardiovascular perfusion will improve Outcome: Progressing Goal: Vascular access site(s) Level 0-1 will be maintained Outcome: Progressing   Problem: Health Behavior/Discharge Planning: Goal: Ability to safely manage health-related needs after discharge will improve Outcome: Progressing   Problem: Safety: Goal: Non-violent Restraint(s) Outcome: Progressing

## 2024-10-29 NOTE — Progress Notes (Signed)
 PHARMACY CONSULT NOTE  Pharmacy Consult for Electrolyte Monitoring and Replacement   Recent Labs: Potassium (mmol/L)  Date Value  10/29/2024 3.9  09/05/2014 3.7   Magnesium (mg/dL)  Date Value  88/86/7974 2.8 (H)   Calcium  (mg/dL)  Date Value  88/86/7974 8.3 (L)   Calcium , Total (mg/dL)  Date Value  90/79/7984 9.9   Albumin (g/dL)  Date Value  88/86/7974 2.9 (L)  09/05/2014 3.5   Phosphorus (mg/dL)  Date Value  88/86/7974 1.5 (L)   Sodium (mmol/L)  Date Value  10/29/2024 156 (H)  09/05/2014 140   Assessment: 78 y.o. male with medical history significant for chronic dementia, Barrett's esophagus, chronic pain, CVA, AAA s/p recent endograft 10/21/2024 who presented to ED with hematemesis. Pharmacy is asked to follow and replace electrolytes while in CCU  Goal of Therapy:  Electrolytes WNL  Plan:  --Na 156, management per PCCM team. Continues on tube feeds and free water  flushes (increased 10/28/24 to 200 mL q2h) and patient is on D51/2 NS at 50 cc/hr --Phos 1.5, potassium phosphate 30 mmol IV x 1 --Follow-up electrolytes with AM labs tomorrow  Marolyn KATHEE Mare 10/29/2024 8:42 AM

## 2024-10-29 NOTE — Progress Notes (Signed)
 NAME:  Albert [name], MRN:  [id number], DOB:  30-Nov-1946, LOS: 5 ADMISSION DATE:  10/24/2024, CONSULTATION DATE: 10/28/2024 REFERRING MD: Dr. Roann, CHIEF COMPLAINT: Acute GI Bleed   History of Present Illness:  Albert [name] is a 78 year old male with a history of dementia, Barrett's esophagus chronic pain syndrome, history of CVA, abdominal aortic aneurysm status post endograft few days ago on October 21, 2024 who came in today with hematemesis.  Patient's wife reported syncopal episode and feelings of malaise subsequently started having melanotic stools and emesis with blood.  Patient was apparently on antiplatelet therapy as well as NSAIDs at home.  He did have CTA performed which showed active extravasation of gastric lumen.  His blood work showed a decrement in hemoglobin from 10.9-7.8. He did have order for 1 unit blood transfusion which is in process of being infused at this moment.  Vascular surgery evaluated patient and did not feel that bleeding was related to prior surgery.  GI evaluation was performed and gastroenterologist attempted EGD with findings of large clotted blood in stomach.  He was unable to finish procedure and requested patient to be transferred to intensive care unit.  Further interventional radiology was contacted for possible embolization and will attempt to evaluate patient today.  Patient received IV Protonix.  Patient is on resuscitative IV fluids.  He is not currently on vasopressor support but he did receive 1 dose of Neo-Synephrine earlier today due to transient hypotension.  ICU admission is requested due to upper GI bleeding.    PCCM asked to admit for further workup and treatment.   Please see Significant Hospital Events section below for full detailed hospital course.  Pertinent  Medical History   Past Medical History:  Diagnosis Date   Anemia    Anxiety    a.) on BZO (alproazolam) PRN   Arthritis of both knees    Back pain    Barrett esophagus     Chronic back pain    Dementia (HCC)    arising in the senium and presenium   Displaced fracture of proximal phalanx of right lesser toe(s), initial encounter for closed fracture    Diverticulosis    GERD (gastroesophageal reflux disease)    OTC meds as needed   Headache    History of colon polyps    benign    History of kidney stones    History of migraine    Hyperlipidemia    Hypertension    Insomnia    Joint pain    Nephrolithiasis    Pre-diabetes    Stroke [facility])    without residual deficits   Micro Data:   11/8: MRSA PCR>> negative   Significant Hospital Events: Including procedures, antibiotic start and stop dates in addition to other pertinent events   11/8: Presented to ED with acute GI bleed.  CT angio suggests slow but active bleeding into the gastric lumen along the great curve.  GI consulted, EGD showed large amount of blood and clot in stomach, but unable to clear blood and clot enough to visualize source of bleeding.  IR consulted and performed coil embolization of the gastroepiploic artery at the greater curvature. 11/9: Repeat EGD again with blood and clot.  General Surgery consulted,  felt surgery would likely be futile and minimal chance of recovery, family declines pursing surgery. Code status changed to DNR/DNI. 11/10: Hgb slowly trending down, remains in low 7's, no obvious bleeding noted per nursing, no plan for additional intervention per GI.  Will transfuse 1 unit pRBC's and 1 FFP. AKI improving.  On minimal vent support, failed WUA/SBT due to severe agitation.  11/11: No significant events overnight.  Afebrile, hemodynamically stable, not requiring vasopressors.  Hgb stable overnight, no additional blood products required.  On minimal vent support, plan for WUA/SBT as tolerated utilizing Precedex once family arrives, diurese with 40 mg IV Lasix x1 dose prior to extubation ~ EXTUBATED to nasal cannula but remains encephalopathic and continues to require precedex.   11/12: Pt continues to have intermittent agitation/delirium requiring 4 point restraints due        to interference with medical treatment and precedex gtt along with prn haldol    11/13: Pt remains on RA O2 sats upper 90's.  Precedex gtt off for now.  Agitation slightly          improved   Interim History / Subjective:  As outlined above   Objective    Blood pressure (!) 192/77, pulse 97, temperature 98.3 F (36.8 C), temperature source Oral, resp. rate (!) 38, height 6' 1 (1.854 m), weight 87.2 kg, SpO2 92%.        Intake/Output Summary (Last 24 hours) at 10/29/2024 0854 Last data filed at 10/29/2024 0730 Gross per 24 hour  Intake 3822.29 ml  Output 2250 ml  Net 1572.29 ml   Filed Weights   10/27/24 0500 10/28/24 0500 10/29/24 0411  Weight: 89.2 kg 87.2 kg 87.2 kg   Examination: General: Acute-chronically-ill appearing male, NAD on RA  HENT: Supple, no JVD  Lungs: Faint rhonchi throughout, even, non labored  Cardiovascular: NSR, s1s2, no m/r/g, 2+ radial/1+ distal pulses, 1+ bilateral upper extremity edema Abdomen: +BS x4, obese, non tender, non distended Extremities: Normal bulk and tone, moves all extremities  Neuro: Awake but confused, following commands, PERRLA GU: Indwelling foley catheter draining yellow urine will place orders to remove   Resolved problem list   Assessment and Plan   #Acute metabolic encephalopathy complicated by dementia  #Chronic pain  - Correct metabolic derangements  - Prn tylenol  and lidocaine  patch for pain management  - Will attempt to wean precedex gtt  - Continue seroquel  and outpatient aricept ; continue prn haldol   - Attempt to avoid narcotics as able  - Maintain sleep/wake cycle - Promote family presence at bedside  - Family reports at baseline pt restless and attempts to get out of bed  - Prn soft restraints to prevent interference with care  #HFpEF  #HTN #HLD #Mildly elevated troponin likely secondary to demand ischemia    Echo 06/25/24: EF 65 to 70%, grade I diastolic dysfunction  - Continuous telemetry monitoring  - If pt remains hypertensive will resume outpatient amlodipine   - Holding aspirin  and plavix  for now due to recent GI bleed - Holding diuresis for now due to worsening hypernatremia  - Troponin peaked at 148  #Acute hypoxic respiratory failure #Pulmonary edema  - Supplemental O2 for dyspnea and/or hypoxia  - Maintain O2 sats 92% or higher  - Prn diuresis as bp and renal function permits  - Intermittent CXR - Prn bronchodilator therapy   #Acute kidney injury  #Hypokalemia  #Hypophosphatemia  #Hypernatremia~worsening  - Trend BMP  - Replace electrolytes as indicated  - Strict I&O's  - Avoid nephrotoxic agents as able  - Continue free water  flushes @200  ml q2hrs and D51/2NS @50  ml/hr   #Acute upper GI bleed, status post coil embolization of of gastroepiploic artery on 11/8, and EGD x2 with no underlying lesion found or continued bleeding  noted #Acute blood loss anemia - Trend CBC  - Monitor for s/sx of bleeding  - Transfuse for hgb <7 - Avoid chemical VTE px for now  - PPI q12hrs   #Non-severe (moderate) malnutrition  - Dietitian consulted appreciate input - Continue thiamine and ascorbic acid  - Continue TF's   #Pre-diabetes  - CBG's q4hrs  - Follow hyper/hypoglycemic protocol  - Target CBG reading 140 to 180   Labs   CBC: Recent Labs  Lab 10/26/24 0755 10/26/24 1619 10/27/24 0359 10/27/24 0931 10/28/24 0421 10/28/24 1115 10/29/24 0412  WBC 8.2  --  9.4  --  12.3* 12.5* 13.5*  NEUTROABS  --   --   --   --   --  10.9*  --   HGB 7.3*   < > 7.9* 7.6* 7.7* 7.8* 7.7*  HCT 22.6*   < > 24.8* 23.8* 23.6* 24.0* 24.0*  MCV 90.4  --  90.2  --  89.4 89.9 89.2  PLT 150  --  142*  --  154 188 193   < > = values in this interval not displayed.    Basic Metabolic Panel: Recent Labs  Lab 10/25/24 0309 10/26/24 0423 10/27/24 0359 10/28/24 0421 10/29/24 0412  NA 143 145  146* 153* 156*  K 4.7 4.0 3.7 3.2* 3.9  CL 111 112* 115* 115* 121*  CO2 20* 21* 23 25 27   GLUCOSE 152* 140* 112* 157* 140*  BUN 57* 79* 75* 54* 49*  CREATININE 2.08* 1.76* 1.47* 1.36* 1.12  CALCIUM  7.2* 7.2* 7.7* 8.2* 8.3*  MG 2.2 2.2  --  2.8* 2.8*  PHOS 6.1*  --  4.2 3.0 1.5*   GFR: Estimated Creatinine Clearance: 62.4 mL/min (by C-G formula based on SCr of 1.12 mg/dL). Recent Labs  Lab 10/27/24 0359 10/28/24 0421 10/28/24 1115 10/29/24 0412  WBC 9.4 12.3* 12.5* 13.5*    Liver Function Tests: Recent Labs  Lab 10/24/24 0731 10/25/24 0309 10/27/24 0359 10/28/24 0421 10/29/24 0412  AST 19 14*  --   --   --   ALT 11 11  --   --   --   ALKPHOS 61 51  --   --   --   BILITOT 0.7 0.4  --   --   --   PROT 6.3* 5.6*  --   --   --   ALBUMIN 3.0* 2.6* 2.4* 2.9* 2.9*   No results for input(s): LIPASE, AMYLASE in the last 168 hours. No results for input(s): AMMONIA in the last 168 hours.  ABG    Component Value Date/Time   PHART 7.33 (L) 10/24/2024 1411   PCO2ART 41 10/24/2024 1411   PO2ART 57 (L) 10/24/2024 1411   HCO3 21.6 10/24/2024 1411   ACIDBASEDEF 4.1 (H) 10/24/2024 1411   O2SAT 89.5 10/24/2024 1411     Coagulation Profile: Recent Labs  Lab 10/24/24 0731 10/25/24 0309  INR 1.2 1.2    Cardiac Enzymes: No results for input(s): CKTOTAL, CKMB, CKMBINDEX, TROPONINI in the last 168 hours.  HbA1C: Hgb A1c MFr Bld  Date/Time Value Ref Range Status  06/24/2024 05:29 AM 5.2 4.8 - 5.6 % Final    Comment:    (NOTE)         Prediabetes: 5.7 - 6.4         Diabetes: >6.4         Glycemic control for adults with diabetes: <7.0     CBG: Recent Labs  Lab 10/28/24 1448 10/28/24 1945  10/28/24 2319 10/29/24 0400 10/29/24 0756  GLUCAP 126* 138* 142* 135* 141*    Review of Systems:   Unable to assess pt with acute encephalopathy   Past Medical History:  He,  has a past medical history of Anemia, Anxiety, Arthritis of both knees, Back pain,  Barrett esophagus, Chronic back pain, Dementia (HCC), Displaced fracture of proximal phalanx of right lesser toe(s), initial encounter for closed fracture, Diverticulosis, GERD (gastroesophageal reflux disease), Headache, History of colon polyps, History of kidney stones, History of migraine, Hyperlipidemia, Hypertension, Insomnia, Joint pain, Nephrolithiasis, Pre-diabetes, and Stroke (HCC).   Surgical History:   Past Surgical History:  Procedure Laterality Date   AMPUTATION TOE Right 04/13/2022   Procedure: 28820 - AMPUTATION TOE;  Surgeon: Ashley Soulier, DPM;  Location: [facility] ORS;  Service: Podiatry;  Laterality: Right;   ANKLE ARTHROSCOPY Right 04/13/2022   Procedure: A-SCOPE/OCD REPAIR;  Surgeon: Ashley Soulier, DPM;  Location: [facility] ORS;  Service: Podiatry;  Laterality: Right;   ANKLE SURGERY Right 1997   ankles/screws    ARTHROSCOPIC REPAIR ACL Right 1993   BACK SURGERY  2017   titanium rods and pins in lower back   COLONOSCOPY     ENDOVASCULAR STENT GRAFT (AAA) N/A 10/21/2024   Procedure: ENDOVASCULAR STENT GRAFT (AAA);  Surgeon: Marea Selinda RAMAN, MD;  Location: [facility] INVASIVE CV LAB;  Service: Cardiovascular;  Laterality: N/A;   EPIDIDYMECTOMY Left 04/10/2017   Procedure: EPIDIDYMECTOMY;  Surgeon: Rosina Riis, MD;  Location: [facility] ORS;  Service: Urology;  Laterality: Left;   ESOPHAGOGASTRODUODENOSCOPY     ESOPHAGOGASTRODUODENOSCOPY N/A 10/24/2024   Procedure: EGD (ESOPHAGOGASTRODUODENOSCOPY);  Surgeon: Onita Elspeth Sharper, DO;  Location: [facility] ENDOSCOPY;  Service: Gastroenterology;  Laterality: N/A;   ESOPHAGOGASTRODUODENOSCOPY N/A 10/25/2024   Procedure: EGD (ESOPHAGOGASTRODUODENOSCOPY);  Surgeon: Onita Elspeth Sharper, DO;  Location: [facility] ENDOSCOPY;  Service: Gastroenterology;  Laterality: N/A;  in ICU   IR EMBO ART  VEN HEMORR LYMPH EXTRAV  INC GUIDE ROADMAPPING  10/24/2024   LUMBAR FUSION  March 13, 2016   L4/L5 fusion   MINOR HARDWARE REMOVAL Right 04/13/2022   Procedure: REMOVAL  SCREW; DEEP;  Surgeon: Ashley Soulier, DPM;  Location: [facility] ORS;  Service: Podiatry;  Laterality: Right;   TONSILLECTOMY  1962   adenoidectomy    VASECTOMY  1973   WOUND DEBRIDEMENT Right 04/13/2022   Procedure: 29906 - SUBTALAR ARTHROSCOPY;  Surgeon: Ashley Soulier, DPM;  Location: [facility] ORS;  Service: Podiatry;  Laterality: Right;     Social History:   reports that he has been smoking cigarettes. He has a 25 pack-year smoking history. He has never used smokeless tobacco. He reports that he does not drink alcohol and does not use drugs.   Family History:  His family history includes Arthritis in his mother; Esophageal cancer in his father. There is no history of Prostate cancer, Bladder Cancer, or Kidney cancer.   Allergies Allergies  Allergen Reactions   Codeine Nausea And Vomiting     Home Medications  Prior to Admission medications   Medication Sig Start Date End Date Taking? Authorizing Provider  acetaminophen  (TYLENOL ) 325 MG tablet Take 650 mg by mouth every 8 (eight) hours as needed for moderate pain.   Yes [provider]  amLODipine  (NORVASC ) 10 MG tablet Take 10 mg by mouth daily. 08/27/18  Yes [provider]  aspirin  EC 81 MG tablet Take 81 mg by mouth daily.   Yes [provider]  clopidogrel  (PLAVIX ) 75 MG tablet 75 mg daily. 07/29/19  Yes [provider]  cyanocobalamin  (VITAMIN B12) 1000 MCG tablet Take 1,000 mcg by mouth.  Take 1,000 mcg by mouth once daily   Yes [provider]  diphenhydramine-acetaminophen  (TYLENOL  PM) 25-500 MG TABS tablet Take 1 tablet by mouth at bedtime.   Yes [provider]  donepezil  (ARICEPT ) 10 MG tablet Take 10 mg by mouth.  TAKE 1 TABLET(10 MG) BY MOUTH AT BEDTIME 04/23/24  Yes [provider]  DULoxetine  (CYMBALTA ) 60 MG capsule Take 60 mg by mouth daily. 02/25/19 10/25/24 Yes [provider]  ferrous sulfate  325 (65 FE) MG tablet Take 325 mg by mouth.  Take 325 mg by mouth  daily with breakfast   Yes [provider]  ibuprofen  (ADVIL ) 400 MG tablet Take 400 mg by mouth every 6 (six) hours as needed.   Yes [provider]  Multiple Vitamin (MULTIVITAMIN WITH MINERALS) TABS tablet Take 1 tablet by mouth daily.   Yes [provider]  naproxen sodium (ALEVE) 220 MG tablet Take 220 mg by mouth daily as needed.   Yes [provider]  QUEtiapine  (SEROQUEL ) 25 MG tablet Take 25 mg by mouth at bedtime. 06/15/24  Yes [provider]  rosuvastatin  (CRESTOR ) 20 MG tablet Take 20 mg by mouth daily. 07/29/19  Yes [provider]  ALPRAZolam  (XANAX ) 0.25 MG tablet Take 0.25 mg by mouth at bedtime as needed for anxiety. Patient not taking: Reported on 10/22/2024 05/01/19   [provider]  naphazoline-pheniramine (NAPHCON-A) 0.025-0.3 % ophthalmic solution Place 1 drop into both eyes 4 (four) times daily as needed for eye irritation. Patient not taking: Reported on 10/25/2024    [provider]  trolamine salicylate (ASPERCREME) 10 % cream Apply 1 application topically as needed for muscle pain. Patient not taking: Reported on 10/25/2024    [provider]     Critical care time: 35 minutes     Lonell Moose, AGNP  Pulmonary/Critical Care Pager [phone] (please enter 7 digits) PCCM Consult Pager [phone] (please enter 7 digits)

## 2024-10-30 ENCOUNTER — Inpatient Hospital Stay

## 2024-10-30 DIAGNOSIS — F02C11 Dementia in other diseases classified elsewhere, severe, with agitation: Secondary | ICD-10-CM | POA: Diagnosis not present

## 2024-10-30 DIAGNOSIS — N179 Acute kidney failure, unspecified: Secondary | ICD-10-CM | POA: Diagnosis not present

## 2024-10-30 DIAGNOSIS — K922 Gastrointestinal hemorrhage, unspecified: Secondary | ICD-10-CM | POA: Diagnosis not present

## 2024-10-30 DIAGNOSIS — J9601 Acute respiratory failure with hypoxia: Secondary | ICD-10-CM | POA: Diagnosis not present

## 2024-10-30 LAB — RENAL FUNCTION PANEL
Albumin: 2.7 g/dL — ABNORMAL LOW (ref 3.5–5.0)
Anion gap: 11 (ref 5–15)
BUN: 36 mg/dL — ABNORMAL HIGH (ref 8–23)
CO2: 24 mmol/L (ref 22–32)
Calcium: 7.8 mg/dL — ABNORMAL LOW (ref 8.9–10.3)
Chloride: 116 mmol/L — ABNORMAL HIGH (ref 98–111)
Creatinine, Ser: 1.08 mg/dL (ref 0.61–1.24)
GFR, Estimated: >60 mL/min (ref >60)
Glucose, Bld: 167 mg/dL — ABNORMAL HIGH (ref 70–99)
Phosphorus: 3.1 mg/dL (ref 2.5–4.6)
Potassium: 3.4 mmol/L — ABNORMAL LOW (ref 3.5–5.1)
Sodium: 151 mmol/L — ABNORMAL HIGH (ref 135–145)

## 2024-10-30 LAB — CBC
HCT: 25.6 % — ABNORMAL LOW (ref 39.0–52.0)
Hemoglobin: 8.2 g/dL — ABNORMAL LOW (ref 13.0–17.0)
MCH: 29 pg (ref 26.0–34.0)
MCHC: 32 g/dL (ref 30.0–36.0)
MCV: 90.5 fL (ref 80.0–100.0)
Platelets: 232 K/uL (ref 150–400)
RBC: 2.83 MIL/uL — ABNORMAL LOW (ref 4.22–5.81)
RDW: 15.7 % — ABNORMAL HIGH (ref 11.5–15.5)
WBC: 12.7 K/uL — ABNORMAL HIGH (ref 4.0–10.5)
nRBC: 0.2 % (ref 0.0–0.2)

## 2024-10-30 LAB — GLUCOSE, CAPILLARY
Glucose-Capillary: 128 mg/dL — ABNORMAL HIGH (ref 70–99)
Glucose-Capillary: 134 mg/dL — ABNORMAL HIGH (ref 70–99)
Glucose-Capillary: 140 mg/dL — ABNORMAL HIGH (ref 70–99)
Glucose-Capillary: 144 mg/dL — ABNORMAL HIGH (ref 70–99)
Glucose-Capillary: 152 mg/dL — ABNORMAL HIGH (ref 70–99)
Glucose-Capillary: 160 mg/dL — ABNORMAL HIGH (ref 70–99)

## 2024-10-30 LAB — MRSA NEXT GEN BY PCR, NASAL: MRSA by PCR Next Gen: NOT DETECTED

## 2024-10-30 LAB — MAGNESIUM: Magnesium: 2.4 mg/dL (ref 1.7–2.4)

## 2024-10-30 MED ORDER — ACETAMINOPHEN 325 MG PO TABS
650.0000 mg | ORAL_TABLET | Freq: Four times a day (QID) | ORAL | Status: DC | PRN
  Administered 2024-10-30 – 2024-11-06 (×6): 650 mg
  Filled 2024-10-30 (×7): qty 2

## 2024-10-30 MED ORDER — ACETAMINOPHEN 650 MG RE SUPP: 650.0000 mg | Freq: Four times a day (QID) | RECTAL | Status: DC | PRN

## 2024-10-30 MED ORDER — HYDROMORPHONE HCL 1 MG/ML IJ SOLN
1.0000 mg | Freq: Once | INTRAMUSCULAR | Status: AC
  Administered 2024-10-30: 1 mg via INTRAVENOUS
  Filled 2024-10-30: qty 1

## 2024-10-30 MED ORDER — FUROSEMIDE 10 MG/ML IJ SOLN
40.0000 mg | Freq: Once | INTRAMUSCULAR | Status: AC
  Administered 2024-10-30: 40 mg via INTRAVENOUS
  Filled 2024-10-30: qty 4

## 2024-10-30 MED ORDER — POTASSIUM CHLORIDE 20 MEQ PO PACK
20.0000 meq | PACK | Freq: Once | ORAL | Status: AC
  Administered 2024-10-30: 20 meq
  Filled 2024-10-30: qty 1

## 2024-10-30 MED ORDER — PIPERACILLIN-TAZOBACTAM 3.375 G IVPB
3.3750 g | Freq: Three times a day (TID) | INTRAVENOUS | Status: AC
  Administered 2024-10-30 – 2024-11-04 (×17): 3.375 g via INTRAVENOUS
  Filled 2024-10-30 (×17): qty 50

## 2024-10-30 NOTE — TOC Initial Note (Signed)
 Transition of Care (TOC) - Initial/Assessment Note    Patient Details  Name: Albert [name] MRN: [id number] Date of Birth: 04/25/1946  Transition of Care [facility]) CM/SW Contact:    Corrie JINNY Ruts, LCSW Phone Number: 10/30/2024, 12:33 PM  Clinical Narrative:                 Chart reviewed. The patient was admitted for GI bleed. The patient has advanced dementia. I was able to speak with the patient wife and son at bed side today.   I introduced myself, my role, and reason for consult. The patient wife confirmed that the patient has a PCP. The patient wife reports that she lives in the home with the patient and is his caregiver.   The patient wife reports that the patient needs assistance and she helps the patient. The patient wife reports that she will assist the patient at D/C. The patient wife reports that the patient feeds himself with his hands and can put his clothes on but may put them on wrong.   The patient wife reports that the patient uses [facility]. The patient wife reports that the patient was active with [facility] in the past and would like them again if [facility] is reccommended.   The patient wife reports that the patient has not been in SNF in the past but has been thinking about it and would like discuss when medically stable.         Patient Goals and CMS Choice            Expected Discharge Plan and Services                                              Prior Living Arrangements/Services                       Activities of Daily Living   ADL Screening (condition at time of admission) Independently performs ADLs?: No Does the patient have a NEW difficulty with bathing/dressing/toileting/self-feeding that is expected to last >3 days?: Yes (Initiates electronic notice to provider for possible OT consult) Does the patient have a NEW difficulty with getting in/out of bed, walking, or climbing stairs that is expected to last >3 days?: Yes  (Initiates electronic notice to provider for possible PT consult) Does the patient have a NEW difficulty with communication that is expected to last >3 days?: Yes (Initiates electronic notice to provider for possible SLP consult) Is the patient deaf or have difficulty hearing?: Yes Does the patient have difficulty seeing, even when wearing glasses/contacts?: No Does the patient have difficulty concentrating, remembering, or making decisions?: No  Permission Sought/Granted                  Emotional Assessment              Admission diagnosis:  Acute upper GI bleed [K92.2] GI bleeding [K92.2] Upper GI bleed [K92.2] Patient Active Problem List   Diagnosis Date Noted   Malnutrition of moderate degree 10/28/2024   Upper GI bleed 10/27/2024   Acute respiratory failure with hypoxia (HCC) 10/27/2024   Toxic metabolic encephalopathy 10/27/2024   GI bleeding 10/24/2024   Acute upper GI bleed 10/24/2024   AAA (abdominal aortic aneurysm) without rupture 08/28/2024   Acute lacunar stroke (HCC) 06/23/2024   Severe Alzheimer's dementia with  agitation (HCC) 06/24/2020   Toe fracture 02/22/2020   Displaced fracture of proximal phalanx of right lesser toe(s), initial encounter for closed fracture 07/30/2019   H/O stroke without residual deficits 03/03/2018   Prediabetes 08/01/2017   Anxiety 12/26/2016   Common migraine 12/26/2016   GERD (gastroesophageal reflux disease) 12/26/2016   Health care maintenance 12/26/2016   HTN, goal below 140/90 12/26/2016   Hypercholesterolemia 12/26/2016   Degenerative spondylolisthesis 03/13/2016   PCP:  Lenon [name] ORN, MD Pharmacy:   [id number] [place], [place] - [address] AT [facility] [address] [facility] [address] [place] [address] Phone: [phone] Fax: [phone]     Social Drivers of Health (SDOH) Social History: SDOH Screenings   Food Insecurity: Patient Unable To Answer  (10/24/2024)  Housing: Patient Unable To Answer (10/24/2024)  Transportation Needs: Patient Unable To Answer (10/24/2024)  Utilities: Patient Unable To Answer (10/24/2024)  Financial Resource Strain: Low Risk  (08/31/2024)   Received from [facility]  Social Connections: Unknown (10/21/2024)  Tobacco Use: High Risk (10/24/2024)   SDOH Interventions: Food Insecurity Interventions: Patient Unable to Answer Utilities Interventions: Patient Unable to Answer   Readmission Risk Interventions    10/30/2024   12:32 PM  Readmission Risk Prevention Plan  Transportation Screening Complete  PCP or Specialist Appt within 5-7 Days Complete  Home Care Screening Complete  Medication Review (RN CM) Complete

## 2024-10-30 NOTE — Plan of Care (Signed)
 Discussed with patient in front of sitter plan of care for the evening, pain management and catheter with teach back needing to be reinforced.   Problem: Education: Goal: Knowledge of General Education information will improve Description: Including pain rating scale, medication(s)/side effects and non-pharmacologic comfort measures Outcome: Progressing   Problem: Health Behavior/Discharge Planning: Goal: Ability to manage health-related needs will improve Outcome: Not Progressing   Problem: Pain Managment: Goal: General experience of comfort will improve and/or be controlled Outcome: Progressing   Problem: Safety: Goal: Ability to remain free from injury will improve Outcome: Not Progressing

## 2024-10-30 NOTE — Plan of Care (Signed)
  Problem: Clinical Measurements: Goal: Cardiovascular complication will be avoided Outcome: Progressing   Problem: Nutrition: Goal: Adequate nutrition will be maintained Outcome: Progressing   Problem: Elimination: Goal: Will not experience complications related to bowel motility Outcome: Progressing Goal: Will not experience complications related to urinary retention Outcome: Progressing   Problem: Cardiovascular: Goal: Ability to achieve and maintain adequate cardiovascular perfusion will improve Outcome: Progressing Goal: Vascular access site(s) Level 0-1 will be maintained Outcome: Progressing

## 2024-10-30 NOTE — Plan of Care (Signed)
  Problem: Nutrition: Goal: Adequate nutrition will be maintained Outcome: Progressing   Problem: Clinical Measurements: Goal: Ability to maintain clinical measurements within normal limits will improve Outcome: Not Progressing Goal: Diagnostic test results will improve Outcome: Not Progressing   Problem: Activity: Goal: Risk for activity intolerance will decrease Outcome: Not Progressing   Problem: Coping: Goal: Level of anxiety will decrease Outcome: Not Progressing   Problem: Pain Managment: Goal: General experience of comfort will improve and/or be controlled Outcome: Not Progressing   Problem: Safety: Goal: Ability to remain free from injury will improve Outcome: Not Progressing

## 2024-10-30 NOTE — Progress Notes (Signed)
 PHARMACY CONSULT NOTE  Pharmacy Consult for Electrolyte Monitoring and Replacement   Recent Labs: Potassium (mmol/L)  Date Value  10/30/2024 3.4 (L)  09/05/2014 3.7   Magnesium (mg/dL)  Date Value  88/85/7974 2.4   Calcium  (mg/dL)  Date Value  88/85/7974 7.8 (L)   Calcium , Total (mg/dL)  Date Value  90/79/7984 9.9   Albumin (g/dL)  Date Value  88/85/7974 2.7 (L)  09/05/2014 3.5   Phosphorus (mg/dL)  Date Value  88/85/7974 3.1   Sodium (mmol/L)  Date Value  10/30/2024 151 (H)  09/05/2014 140   Assessment: 78 y.o. male with medical history significant for chronic dementia, Barrett's esophagus, chronic pain, CVA, AAA s/p recent endograft 10/21/2024 who presented to ED with hematemesis. Pharmacy is asked to follow and replace electrolytes while in CCU  Goal of Therapy:  Electrolytes WNL  Plan:  --Na 151, management per PCCM team. Continues on tube feeds and free water  flushes (increased 10/28/24 to 200 mL q2h)  --K 3.4 > 20 mEq KCL packet per tube x1 --Follow-up electrolytes with AM labs tomorrow  Leonor JAYSON Argyle 10/30/2024 8:00 AM

## 2024-10-30 NOTE — Evaluation (Signed)
 Occupational Therapy Evaluation Patient Details Name: Albert [name] MRN: [id number] DOB: March 27, 1946 Today's Date: 10/30/2024   History of Present Illness   78 year old male admitted with acute blood loss anemia secondary to upper GI bleed status post coil embolization of the gastroepiploic artery on 11/8 with EGD twice not show any underlying lesion or source of continued bleed. Pt intubated for hemorraghic shock then extubated to [place] on 10/27/2024. MD dx include: acute metabolic encephalopathy complicated by dementia, HFpEF, HTN, HLD, acute hypoxic respiratory failure, pulmonary edema, acute blood loss anemia, hypernatremia.   Clinical Impressions Albert [name] was seen for OT/PT co-evaluation this date. Prior to hospital admission, pt was ambulatory with [facility] as needed, assist for ADLs from spouse. Pt lives with spouse. No family at bedside, pt states name and follows ~50% commands, noted baseline dementia.   Pt currently requires MOD A x2 to exit bed, MIN A x2 return to bed. Tolerates static sitting fluctuating CGA/MIN A with attention to task, MAX A with distractions. MOD A x2 + HHA sit<>stand x3 trials at EOB for pericare, pt stands upright with pericare and attempts to swat washcloth away - suspect painful with redness noted. Pt would benefit from skilled OT to address noted impairments and functional limitations (see below for any additional details). Upon hospital discharge, recommend OT follow up <3 hours/day.     If plan is discharge home, recommend the following:   Two people to help with walking and/or transfers;Two people to help with bathing/dressing/bathroom;Help with stairs or ramp for entrance;Supervision due to cognitive status     Functional Status Assessment   Patient has had a recent decline in their functional status and demonstrates the ability to make significant improvements in function in a reasonable and predictable amount of time.     Equipment  Recommendations   Other (comment) (defer)     Recommendations for Other Services         Precautions/Restrictions   Precautions Precautions: Fall Recall of Precautions/Restrictions: Impaired Restrictions Weight Bearing Restrictions Per Provider Order: No     Mobility Bed Mobility Overal bed mobility: Needs Assistance Bed Mobility: Supine to Sit, Sit to Supine     Supine to sit: Mod assist, Max assist, +2 for physical assistance Sit to supine: Min assist, +2 for physical assistance   General bed mobility comments: limited by cognition    Transfers Overall transfer level: Needs assistance Equipment used: 1 person hand held assist Transfers: Sit to/from Stand Sit to Stand: Mod assist, +2 physical assistance           General transfer comment: achieves upright posture once pericare initiated      Balance Overall balance assessment: Needs assistance Sitting-balance support: Feet supported, No upper extremity supported Sitting balance-Leahy Scale: Poor Sitting balance - Comments: fluctuated with cognition, CGA-MIN sitting with attention to task, MAX A to prevent return to bed Postural control: Posterior lean Standing balance support: Bilateral upper extremity supported, During functional activity Standing balance-Leahy Scale: Poor                             ADL either performed or assessed with clinical judgement   ADL Overall ADL's : Needs assistance/impaired                                       General ADL Comments: MAX A x2 pericare  standing.      Pertinent Vitals/Pain Pain Assessment Pain Assessment: PAINAD Breathing: occasional labored breathing, short period of hyperventilation Negative Vocalization: none Facial Expression: sad, frightened, frown Body Language: tense, distressed pacing, fidgeting Consolability: distracted or reassured by voice/touch PAINAD Score: 4 Pain Intervention(s): Limited activity within  patient's tolerance, Repositioned     Extremity/Trunk Assessment Upper Extremity Assessment Upper Extremity Assessment: Generalized weakness   Lower Extremity Assessment Lower Extremity Assessment: Generalized weakness   Cervical / Trunk Assessment Cervical / Trunk Assessment: Kyphotic   Communication Communication Communication: Impaired Factors Affecting Communication: Difficulty expressing self;Reduced clarity of speech   Cognition Arousal: Alert Behavior During Therapy: Restless, Agitated Cognition: History of cognitive impairments, Cognition impaired, No family/caregiver present to determine baseline           Executive functioning impairment (select all impairments): Initiation, Sequencing OT - Cognition Comments: states name and responds to 50% commands                 Following commands: Impaired Following commands impaired: Follows one step commands inconsistently     Cueing  General Comments   Cueing Techniques: Verbal cues;Tactile cues  mittens donned at beginning and end of session. Pt left with HOB >30degrees.   Exercises     Shoulder Instructions      Home Living Family/patient expects to be discharged to:: Private residence Living Arrangements: Spouse/significant other Available Help at Discharge: Family;Available 24 hours/day Type of Home: Apartment Home Access: Level entry     Home Layout: One level     Bathroom Shower/Tub: Tub/shower unit;Sponge bathes at baseline         Home Equipment: [place] - single point   Additional Comments: per previous admission on 06/23/2024      Prior Functioning/Environment Prior Level of Function : Independent/Modified Independent             Mobility Comments: Per previous admission: pt uses cane to amb to mailbox. No AD otherwise ADLs Comments: per previous admission, pt requires assistance at baseline for ADLs    OT Problem List: Decreased strength;Decreased range of motion;Decreased  activity tolerance;Impaired balance (sitting and/or standing);Decreased cognition;Decreased safety awareness   OT Treatment/Interventions: Self-care/ADL training;Therapeutic exercise;Energy conservation;DME and/or AE instruction;Therapeutic activities;Patient/family education;Balance training      OT Goals(Current goals can be found in the care plan section)   Acute Rehab OT Goals Patient Stated Goal: none stated OT Goal Formulation: Patient unable to participate in goal setting Time For Goal Achievement: 11/13/24 Potential to Achieve Goals: Fair ADL Goals Pt Will Perform Grooming: with min assist;standing Pt Will Perform Lower Body Dressing: with min assist;with caregiver independent in assisting;sit to/from stand Pt Will Transfer to Toilet: with min assist;ambulating;regular height toilet   OT Frequency:  Min 2X/week    Co-evaluation PT/OT/SLP Co-Evaluation/Treatment: Yes Reason for Co-Treatment: Complexity of the patient's impairments (multi-system involvement);Necessary to address cognition/behavior during functional activity;For patient/therapist safety PT goals addressed during session: Mobility/safety with mobility OT goals addressed during session: ADL's and self-care      AM-PAC OT 6 Clicks Daily Activity     Outcome Measure Help from another person eating meals?: Total Help from another person taking care of personal grooming?: A Lot Help from another person toileting, which includes using toliet, bedpan, or urinal?: A Lot Help from another person bathing (including washing, rinsing, drying)?: A Lot Help from another person to put on and taking off regular upper body clothing?: A Lot Help from another person to put on and taking off regular  lower body clothing?: A Lot 6 Click Score: 11   End of Session Nurse Communication: Mobility status  Activity Tolerance: Treatment limited secondary to agitation;Patient limited by fatigue Patient left: in bed;with call  bell/phone within reach;with bed alarm set;with nursing/sitter in room  OT Visit Diagnosis: Other abnormalities of gait and mobility (R26.89);Muscle weakness (generalized) (M62.81)                Time: 8545-8484 OT Time Calculation (min): 21 min Charges:  OT General Charges $OT Visit: 1 Visit OT Evaluation $OT Eval Moderate Complexity: 1 Mod  Elston Slot, M.S. OTR/L  10/30/24, 4:14 PM  ascom [phone]

## 2024-10-30 NOTE — Progress Notes (Signed)
 Inge, NP aware of patient's temp of 101.2 F axillary, HR in 100s, and RR in 30s.

## 2024-10-30 NOTE — Evaluation (Signed)
 Physical Therapy Evaluation Patient Details Name: Albert [name] MRN: [id number] DOB: 21-Mar-1946 Today's Date: 10/30/2024  History of Present Illness  78 year old male admitted with acute blood loss anemia secondary to upper GI bleed status post coil embolization of the gastroepiploic artery on 11/8 with EGD twice not show any underlying lesion or source of continued bleed. Pt intubated for hemorraghic shock then extubated to [place] on 10/27/2024. MD dx include: acute metabolic encephalopathy complicated by dementia, HFpEF, HTN, HLD, acute hypoxic respiratory failure, pulmonary edema, acute blood loss anemia, hypernatremia.  Clinical Impression  Cotx with OT for safety. 78 y.o. pt admitted for GI bleed. Pt unable to provide info about PLOF d/t cognitive impairment and reduced clarity of speech. According to previous hospital admission, pt able to amb short distances with [facility] and without AD. Pt requires assistance for ADLs at baseline. Pt able to stand x2 with mod A x 2 from EOB. Pt requires heavy cuing for sequencing. Pt varied from requiring max-CGA for sitting balance. Pt demonstrates deficits in strength/balance/activity tolerance limiting his ability to safely perform functional mobility tasks. Would benefit from skilled PT to address above deficits and promote optimal return to PLOF.         If plan is discharge home, recommend the following: Two people to help with walking and/or transfers;Two people to help with bathing/dressing/bathroom;Assistance with cooking/housework;Direct supervision/assist for medications management;Direct supervision/assist for financial management;Assist for transportation;Supervision due to cognitive status   Can travel by private vehicle   No    Equipment Recommendations Other (comment) (defer to next venue)  Recommendations for Other Services       Functional Status Assessment Patient has had a recent decline in their functional status and demonstrates the  ability to make significant improvements in function in a reasonable and predictable amount of time.     Precautions / Restrictions Precautions Precautions: Fall Recall of Precautions/Restrictions: Impaired Restrictions Weight Bearing Restrictions Per Provider Order: No      Mobility  Bed Mobility Overal bed mobility: Needs Assistance Bed Mobility: Supine to Sit, Sit to Supine     Supine to sit: Mod assist, Max assist, +2 for physical assistance Sit to supine: Mod assist, +2 for physical assistance   General bed mobility comments: max cuing for sequencing. Mod A x2 for physical assistance and safety    Transfers Overall transfer level: Needs assistance Equipment used: 1 person hand held assist Transfers: Sit to/from Stand Sit to Stand: Mod assist, +2 physical assistance           General transfer comment: Pt requires max A as cue to stand. Once halfway up in squat position, pt able to stand with mod A x2. Performed STSx2 from EOB keeping forward flexed position however is able to stand erect when performing hygeine to perianal area.    Ambulation/Gait               General Gait Details: Deferred for safety  Stairs            Wheelchair Mobility     Tilt Bed    Modified Rankin (Stroke Patients Only)       Balance Overall balance assessment: Needs assistance Sitting-balance support: Feet supported, No upper extremity supported Sitting balance-Leahy Scale: Poor Sitting balance - Comments: fluctuated between maxA d/t pt wanting to return to bed, however when participating he was min-CGA. Pt able to lean on R elbow and maintain seated balance in this position. Able to scoot to the left at EOB  with CGA. Postural control: Posterior lean Standing balance support: Bilateral upper extremity supported, During functional activity Standing balance-Leahy Scale: Poor                               Pertinent Vitals/Pain Pain Assessment Pain  Assessment: PAINAD Breathing: noisy labored breathing, long periods of hyperventilation, Cheyne-Stokes respirations Negative Vocalization: repeated troubled calling out, loud moaning/groaning, crying Facial Expression: sad, frightened, frown Body Language: tense, distressed pacing, fidgeting Consolability: distracted or reassured by voice/touch PAINAD Score: 7 Pain Intervention(s): Limited activity within patient's tolerance, Monitored during session    Home Living Family/patient expects to be discharged to:: Private residence Living Arrangements: Spouse/significant other Available Help at Discharge: Family;Available 24 hours/day Type of Home: Apartment Home Access: Level entry       Home Layout: One level Home Equipment: [place] - single point Additional Comments: home environment from previous admission on 06/23/2024    Prior Function Prior Level of Function : Independent/Modified Independent             Mobility Comments: Per previous admission: pt uses cane to amb to mailbox. No AD otherwise ADLs Comments: per previous admission, pt requires assistance at baseline for ADLs     Extremity/Trunk Assessment   Upper Extremity Assessment Upper Extremity Assessment: Generalized weakness    Lower Extremity Assessment Lower Extremity Assessment: Generalized weakness    Cervical / Trunk Assessment Cervical / Trunk Assessment: Kyphotic  Communication   Communication Communication: Impaired Factors Affecting Communication: Difficulty expressing self;Reduced clarity of speech    Cognition Arousal: Alert Behavior During Therapy: Restless, Agitated   PT - Cognitive impairments: History of cognitive impairments, No family/caregiver present to determine baseline                         Following commands: Impaired Following commands impaired: Follows one step commands inconsistently     Cueing Cueing Techniques: Verbal cues, Tactile cues     General Comments  General comments (skin integrity, edema, etc.): mittens donned at beginning and end of session. Pt left with HOB >30degrees.    Exercises Other Exercises Other Exercises: Assessment of vitals throughout session. Pt HR up to 124bpm with standing BP WNL. Pt did have elevated RR throughout session with it being 33bpm max. Wheezy cough and breaths.   Assessment/Plan    PT Assessment Patient needs continued PT services  PT Problem List Decreased strength;Decreased activity tolerance;Decreased balance;Decreased mobility;Decreased cognition;Decreased knowledge of use of DME;Decreased safety awareness       PT Treatment Interventions DME instruction;Gait training;Functional mobility training;Therapeutic activities;Balance training;Therapeutic exercise;Neuromuscular re-education;Patient/family education    PT Goals (Current goals can be found in the Care Plan section)  Acute Rehab PT Goals Patient Stated Goal: none stated PT Goal Formulation: Patient unable to participate in goal setting Time For Goal Achievement: 11/13/24 Potential to Achieve Goals: Good    Frequency Min 1X/week     Co-evaluation PT/OT/SLP Co-Evaluation/Treatment: Yes Reason for Co-Treatment: Complexity of the patient's impairments (multi-system involvement);Necessary to address cognition/behavior during functional activity;For patient/therapist safety PT goals addressed during session: Mobility/safety with mobility OT goals addressed during session: ADL's and self-care       AM-PAC PT 6 Clicks Mobility  Outcome Measure Help needed turning from your back to your side while in a flat bed without using bedrails?: A Lot Help needed moving from lying on your back to sitting on the side of a flat bed  without using bedrails?: A Lot Help needed moving to and from a bed to a chair (including a wheelchair)?: A Lot Help needed standing up from a chair using your arms (e.g., wheelchair or bedside chair)?: A Lot Help needed to  walk in hospital room?: Total Help needed climbing 3-5 steps with a railing? : Total 6 Click Score: 10    End of Session   Activity Tolerance: Patient limited by fatigue;Treatment limited secondary to agitation Patient left: in bed;with bed alarm set;with call bell/phone within reach;with nursing/sitter in room Nurse Communication: Mobility status PT Visit Diagnosis: Unsteadiness on feet (R26.81);Muscle weakness (generalized) (M62.81);Difficulty in walking, not elsewhere classified (R26.2)    Time: 8545-8485 PT Time Calculation (min) (ACUTE ONLY): 20 min   Charges:                 Efrata Brunner, SPT   [place] 10/30/2024, 3:46 PM

## 2024-10-30 NOTE — Progress Notes (Signed)
 NAME:  Albert Melchior., MRN:  [id number], DOB:  1946-02-27, LOS: 6 ADMISSION DATE:  10/24/2024, CONSULTATION DATE:  10/24/2024 REFERRING MD:  Miki, CHIEF COMPLAINT:  Acute GI Bleed    Brief Pt Description / Synopsis:  78 y.o. male with PMHx significant for AAA with recent endovascular stent grafting on October 21 2024, who is admitted with Acute Upper GI Bleed.  EGD failed to localize the source of bleeding, ultimately requiring coil embolization of the mid-segment of the gastroepiploic artery along the greater curvature of the stomach.  Repeat EGD showed no evidence of underlying lesion or continued bleeding. Course complicated by agitated delirium and Acute Hypoxic Respiratory Failure due to pulmonary edema in setting of large volume resuscitation.  History of Present Illness:  Albert [name] is a 78 year old male with a history of dementia, Barrett's esophagus chronic pain syndrome, history of CVA, abdominal aortic aneurysm status post endograft few days ago on October 21, 2024 who came in today with hematemesis.  Patient's wife reported syncopal episode and feelings of malaise subsequently started having melanotic stools and emesis with blood.  Patient was apparently on antiplatelet therapy as well as NSAIDs at home.  He did have CTA performed which showed active extravasation of gastric lumen.  His blood work showed a decrement in hemoglobin from 10.9-7.8. He did have order for 1 unit blood transfusion which is in process of being infused at this moment.  Vascular surgery evaluated patient and did not feel that bleeding was related to prior surgery.  GI evaluation was performed and gastroenterologist attempted EGD with findings of large clotted blood in stomach.  He was unable to finish procedure and requested patient to be transferred to intensive care unit.  Further interventional radiology was contacted for possible embolization and will attempt to evaluate patient today.  Patient received IV  Protonix.  Patient is on resuscitative IV fluids.  He is not currently on vasopressor support but he did receive 1 dose of Neo-Synephrine earlier today due to transient hypotension.  ICU admission is requested due to upper GI bleeding.   PCCM asked to admit for further workup and treatment.  Please see Significant Hospital Events section below for full detailed hospital course.   Pertinent  Medical History   Past Medical History:  Diagnosis Date   Anemia    Anxiety    a.) on BZO (alproazolam) PRN   Arthritis of both knees    Back pain    Barrett esophagus    Chronic back pain    Dementia (HCC)    arising in the senium and presenium   Displaced fracture of proximal phalanx of right lesser toe(s), initial encounter for closed fracture    Diverticulosis    GERD (gastroesophageal reflux disease)    OTC meds as needed   Headache    History of colon polyps    benign    History of kidney stones    History of migraine    Hyperlipidemia    Hypertension    Insomnia    Joint pain    Nephrolithiasis    Pre-diabetes    Stroke (HCC)    without residual deficits    Micro Data:  11/8: MRSA PCR>> negative   Antimicrobials:   Anti-infectives (From admission, onward)    None       Significant Hospital Events: Including procedures, antibiotic start and stop dates in addition to other pertinent events   11/8: Presented to ED with acute GI bleed.  CT angio  suggests slow but active bleeding into the gastric lumen along the great curve.  GI consulted, EGD showed large amount of blood and clot in stomach, but unable to clear blood and clot enough to visualize source of bleeding.  IR consulted and performed coil embolization of the gastroepiploic artery at the greater curvature. 11/9: Repeat EGD again with blood and clot.  General Surgery consulted,  felt surgery would likely be futile and minimal chance of recovery, family declines pursing surgery. Code status changed to  DNR/DNI. 11/10: Hgb slowly trending down, remains in low 7's, no obvious bleeding noted per nursing, no plan for additional intervention per GI.  Will transfuse 1 unit pRBC's and 1 FFP. AKI improving.  On minimal vent support, failed WUA/SBT due to severe agitation.  11/11: No significant events overnight.  Afebrile, hemodynamically stable, not requiring vasopressors.  Hgb stable overnight, no additional blood products required.  On minimal vent support, plan for WUA/SBT as tolerated utilizing Precedex once family arrives, diurese with 40 mg IV Lasix x1 dose prior to extubation ~ EXTUBATED to nasal cannula but remains encephalopathic and continues to require precedex. 11/12: Pt continues to have intermittent agitation/delirium requiring 4 point restraints due to interference with medical treatment and precedex gtt along with prn haldol    11/13: Pt remains on RA O2 sats upper 90's.  Precedex gtt off for now.  Agitation slightly Improved 11/14: No significant events overnight.  Agitation continues to slowly improve, out of 4 point restraints.  Will follow simple commands, consult PT/OT to mobilize as able. Creatinine WNL, Na improved to 151, will Diurese with 40 mg IV Lasix x1 dose.  Did have fever overnight (T max 101.9), WBC stable, will start Zosyn empirically for presumed aspiration.   Interim History / Subjective:  As outlined above under Significant Hospital Events section  Objective   Blood pressure (!) 148/73, pulse 80, temperature 97.8 F (36.6 C), temperature source Axillary, resp. rate (!) 28, height 6' 1 (1.854 m), weight 83.6 kg, SpO2 92%.        Intake/Output Summary (Last 24 hours) at 10/30/2024 9078 Last data filed at 10/30/2024 0900 Gross per 24 hour  Intake 5769.24 ml  Output 3350 ml  Net 2419.24 ml   Filed Weights   10/28/24 0500 10/29/24 0411 10/30/24 0434  Weight: 87.2 kg 87.2 kg 83.6 kg    Examination: General: Acute on chronically ill appearing male, laying in  bed, awake and confused, in NAD HENT: Atraumatic, normocephalic, neck supple, no JVD, Dobhoff tube in place infusing tube feeds  Lungs: Coarse rhonchi throughout, even, mild tachypnea Cardiovascular: RRR, s1s2, no M/R/G Abdomen: obese, soft, nondistended, no guarding or rebound tenderness Extremities: Normal bulk and tone, no deformities, trace edema BLE Neuro: Awake and alert, oriented only to self,  moves all extremities to command an purposefully, no focal deficits noted, PERRL GU: Deferred   Resolved Hospital Problem list     Assessment & Plan:   #Acute Upper GI Bleed, status post coil embolization of of gastroepiploic artery on 11/8, and EGD x2 with no underlying lesion found or continued bleeding noted #Acute Blood Loss Anemia -Monitor for S/Sx of bleeding -Trend CBC (H&H q6h) -SCD's  for VTE Prophylaxis  -Transfuse for Hgb <7 -Transfuse Platelets for Platelet count < 10K; < 50K with active bleeding; < 100K with Neurosurgical procedures/processes  -Continue PPI BID -GI following, appreciate input -General Surgery consulted ~ felt that subtotal gastrectomy would likely be futile given his current critical illness superimposed chronic co morbidities,  while adding pain and suffering ~  family elected not to pursue surgery   #Acute Hypoxic Respiratory Failure #Pulmonary edema due to large volume resuscitation #Concern for Aspiration -Extubated 11/11 -Supplemental O2 as needed to maintain O2 sats >92% -Pt is DNR/DNI  -Follow intermittent Chest X-ray & ABG as needed -Bronchodilators prn -ABX as above -Diuresis as BP and renal function permits ~ give 40 mg IV Lasix x1 dose on 11/14 -Pulmonary toilet as able -Consult PT/OT to mobilize as able   #HFpEF  #HTN #HLD #Mildly elevated troponin likely secondary to demand ischemia   Echo 06/25/24: EF 65 to 70%, grade I diastolic dysfunction  - Continuous telemetry monitoring  - If pt remains hypertensive will resume outpatient  amlodipine   - Holding aspirin  and plavix  for now due to recent GI bleed - Holding diuresis for now due to worsening hypernatremia  - Troponin peaked at 148  #Concern for Aspiration -Monitor fever curve -Trend WBC's & Procalcitonin -Follow cultures as above -Start empiric Zosyn pending cultures & sensitivities  #AKI ~ IMPROVED  #Hypernatremia #Mild Hypokalemia  -Monitor I&O's / urinary output -Follow BMP -Ensure adequate renal perfusion -Avoid nephrotoxic agents as able -Replace electrolytes as indicated ~ Pharmacy following for assistance with electrolyte replacement  #Acute Metabolic Encephalopathy #Agitated Delirium PMHx: Dementia, migraines, chronic pain  -Treatment of metabolic derangements as outlined above -Provide supportive care -Promote normal sleep/wake cycle and family presence -Avoid sedating medications as able -Continue Seroquel  and Aricept ; prn Haldol         Pt is critically ill with multiorgan failure. Prognosis is guarded, high risk for further decompensation, cardiac arrest, and death.  Given current critical illness superimposed on multiple chronic co-morbidities and advanced age, overall long term prognosis is poor.  Pt is DNR/DNI status.   Best Practice (right click and Reselect all SmartList Selections daily)   Diet/type: NPO, tube feeds  DVT prophylaxis: SCD GI prophylaxis: PPI Lines: Central line and yes and it is still needed Foley:  N/A Code Status:  DNR/DNI Last date of multidisciplinary goals of care discussion [11/14]  11/14: Pt's wife updated at bedside on plan of care.  Labs   CBC: Recent Labs  Lab 10/27/24 0359 10/27/24 0931 10/28/24 0421 10/28/24 1115 10/29/24 0412 10/29/24 1618 10/30/24 0429  WBC 9.4  --  12.3* 12.5* 13.5*  --  12.7*  NEUTROABS  --   --   --  10.9*  --   --   --   HGB 7.9*   < > 7.7* 7.8* 7.7* 8.7* 8.2*  HCT 24.8*   < > 23.6* 24.0* 24.0* 27.5* 25.6*  MCV 90.2  --  89.4 89.9 89.2  --  90.5  PLT 142*   --  154 188 193  --  232   < > = values in this interval not displayed.    Basic Metabolic Panel: Recent Labs  Lab 10/25/24 0309 10/26/24 0423 10/27/24 0359 10/28/24 0421 10/29/24 0412 10/29/24 1253 10/29/24 1618 10/30/24 0429  NA 143 145 146* 153* 156* 156*  --  151*  K 4.7 4.0 3.7 3.2* 3.9  --   --  3.4*  CL 111 112* 115* 115* 121*  --   --  116*  CO2 20* 21* 23 25 27   --   --  24  GLUCOSE 152* 140* 112* 157* 140*  --   --  167*  BUN 57* 79* 75* 54* 49*  --   --  36*  CREATININE 2.08* 1.76* 1.47* 1.36* 1.12  --   --  1.08  CALCIUM  7.2* 7.2* 7.7* 8.2* 8.3*  --   --  7.8*  MG 2.2 2.2  --  2.8* 2.8*  --   --  2.4  PHOS 6.1*  --  4.2 3.0 1.5*  --  2.4* 3.1   GFR: Estimated Creatinine Clearance: 64.7 mL/min (by C-G formula based on SCr of 1.08 mg/dL). Recent Labs  Lab 10/28/24 0421 10/28/24 1115 10/29/24 0412 10/30/24 0429  WBC 12.3* 12.5* 13.5* 12.7*    Liver Function Tests: Recent Labs  Lab 10/24/24 0731 10/25/24 0309 10/27/24 0359 10/28/24 0421 10/29/24 0412 10/30/24 0429  AST 19 14*  --   --   --   --   ALT 11 11  --   --   --   --   ALKPHOS 61 51  --   --   --   --   BILITOT 0.7 0.4  --   --   --   --   PROT 6.3* 5.6*  --   --   --   --   ALBUMIN 3.0* 2.6* 2.4* 2.9* 2.9* 2.7*   No results for input(s): LIPASE, AMYLASE in the last 168 hours. No results for input(s): AMMONIA in the last 168 hours.  ABG    Component Value Date/Time   PHART 7.33 (L) 10/24/2024 1411   PCO2ART 41 10/24/2024 1411   PO2ART 57 (L) 10/24/2024 1411   HCO3 21.6 10/24/2024 1411   ACIDBASEDEF 4.1 (H) 10/24/2024 1411   O2SAT 89.5 10/24/2024 1411     Coagulation Profile: Recent Labs  Lab 10/24/24 0731 10/25/24 0309  INR 1.2 1.2    Cardiac Enzymes: No results for input(s): CKTOTAL, CKMB, CKMBINDEX, TROPONINI in the last 168 hours.  HbA1C: Hgb A1c MFr Bld  Date/Time Value Ref Range Status  06/24/2024 05:29 AM 5.2 4.8 - 5.6 % Final    Comment:     (NOTE)         Prediabetes: 5.7 - 6.4         Diabetes: >6.4         Glycemic control for adults with diabetes: <7.0     CBG: Recent Labs  Lab 10/29/24 1616 10/29/24 1935 10/29/24 2351 10/30/24 0431 10/30/24 0743  GLUCAP 135* 119* 127* 160* 128*    Review of Systems:   Unable to assess due to AMS   Past Medical History:  He,  has a past medical history of Anemia, Anxiety, Arthritis of both knees, Back pain, Barrett esophagus, Chronic back pain, Dementia (HCC), Displaced fracture of proximal phalanx of right lesser toe(s), initial encounter for closed fracture, Diverticulosis, GERD (gastroesophageal reflux disease), Headache, History of colon polyps, History of kidney stones, History of migraine, Hyperlipidemia, Hypertension, Insomnia, Joint pain, Nephrolithiasis, Pre-diabetes, and Stroke (HCC).   Surgical History:   Past Surgical History:  Procedure Laterality Date   AMPUTATION TOE Right 04/13/2022   Procedure: 28820 - AMPUTATION TOE;  Surgeon: Ashley Soulier, DPM;  Location: [facility] ORS;  Service: Podiatry;  Laterality: Right;   ANKLE ARTHROSCOPY Right 04/13/2022   Procedure: A-SCOPE/OCD REPAIR;  Surgeon: Ashley Soulier, DPM;  Location: [facility] ORS;  Service: Podiatry;  Laterality: Right;   ANKLE SURGERY Right 1997   ankles/screws    ARTHROSCOPIC REPAIR ACL Right 1993   BACK SURGERY  2017   titanium rods and pins in lower back   COLONOSCOPY     ENDOVASCULAR STENT GRAFT (AAA) N/A 10/21/2024   Procedure: ENDOVASCULAR STENT GRAFT (AAA);  Surgeon: Marea Selinda RAMAN, MD;  Location: [facility];  Service: Cardiovascular;  Laterality: N/A;   EPIDIDYMECTOMY Left 04/10/2017   Procedure: EPIDIDYMECTOMY;  Surgeon: Rosina Riis, MD;  Location: [facility] OR[facility];  Service: Urology;  Laterality: Left;   ESOPHAGOGASTRODUODENOSCOPY     ESOPHAGOGASTRODUODENOSCOPY N/A 10/24/2024   Procedure: EGD (ESOPHAGOGASTRODUODENOSCOPY);  Surgeon: Onita Elspeth Sharper, DO;  Location: [facility] ENDOSCOPY;  Service:  Gastroenterology;  Laterality: N/A;   ESOPHAGOGASTRODUODENOSCOPY N/A 10/25/2024   Procedure: EGD (ESOPHAGOGASTRODUODENOSCOPY);  Surgeon: Onita Elspeth Sharper, DO;  Location: [facility] ENDOSCOPY;  Service: Gastroenterology;  Laterality: N/A;  in ICU   IR EMBO ART  VEN HEMORR LYMPH EXTRAV  INC GUIDE ROADMAPPING  10/24/2024   LUMBAR FUSION  March 13, 2016   L4/L5 fusion   MINOR HARDWARE REMOVAL Right 04/13/2022   Procedure: REMOVAL SCREW; DEEP;  Surgeon: Ashley Soulier, DPM;  Location: [facility] ORS;  Service: Podiatry;  Laterality: Right;   TONSILLECTOMY  1962   adenoidectomy    VASECTOMY  1973   WOUND DEBRIDEMENT Right 04/13/2022   Procedure: 29906 - SUBTALAR ARTHROSCOPY;  Surgeon: Ashley Soulier, DPM;  Location: [facility] ORS;  Service: Podiatry;  Laterality: Right;     Social History:   reports that he has been smoking cigarettes. He has a 25 pack-year smoking history. He has never used smokeless tobacco. He reports that he does not drink alcohol and does not use drugs.   Family History:  His family history includes Arthritis in his mother; Esophageal cancer in his father. There is no history of Prostate cancer, Bladder Cancer, or Kidney cancer.   Allergies Allergies  Allergen Reactions   Codeine Nausea And Vomiting     Home Medications  Prior to Admission medications   Medication Sig Start Date End Date Taking? Authorizing Provider  acetaminophen  (TYLENOL ) 325 MG tablet Take 650 mg by mouth every 8 (eight) hours as needed for moderate pain.   Yes [provider]  amLODipine  (NORVASC ) 10 MG tablet Take 10 mg by mouth daily. 08/27/18  Yes [provider]  aspirin  EC 81 MG tablet Take 81 mg by mouth daily.   Yes [provider]  clopidogrel  (PLAVIX ) 75 MG tablet 75 mg daily. 07/29/19  Yes [provider]  cyanocobalamin  (VITAMIN B12) 1000 MCG tablet Take 1,000 mcg by mouth.  Take 1,000 mcg by mouth once daily   Yes [provider]   diphenhydramine-acetaminophen  (TYLENOL  PM) 25-500 MG TABS tablet Take 1 tablet by mouth at bedtime.   Yes [provider]  donepezil  (ARICEPT ) 10 MG tablet Take 10 mg by mouth.  TAKE 1 TABLET(10 MG) BY MOUTH AT BEDTIME 04/23/24  Yes [provider]  DULoxetine  (CYMBALTA ) 60 MG capsule Take 60 mg by mouth daily. 02/25/19 10/25/24 Yes [provider]  ferrous sulfate  325 (65 FE) MG tablet Take 325 mg by mouth.  Take 325 mg by mouth daily with breakfast   Yes [provider]  ibuprofen  (ADVIL ) 400 MG tablet Take 400 mg by mouth every 6 (six) hours as needed.   Yes [provider]  Multiple Vitamin (MULTIVITAMIN WITH MINERALS) TABS tablet Take 1 tablet by mouth daily.   Yes [provider]  naproxen sodium (ALEVE) 220 MG tablet Take 220 mg by mouth daily as needed.   Yes [provider]  QUEtiapine  (SEROQUEL ) 25 MG tablet Take 25 mg by mouth at bedtime. 06/15/24  Yes [provider]  rosuvastatin  (CRESTOR ) 20 MG tablet Take 20 mg by mouth daily. 07/29/19  Yes [provider]  ALPRAZolam  (  XANAX ) 0.25 MG tablet Take 0.25 mg by mouth at bedtime as needed for anxiety. Patient not taking: Reported on 10/22/2024 05/01/19   [provider]  naphazoline-pheniramine (NAPHCON-A) 0.025-0.3 % ophthalmic solution Place 1 drop into both eyes 4 (four) times daily as needed for eye irritation. Patient not taking: Reported on 10/25/2024    [provider]  trolamine salicylate (ASPERCREME) 10 % cream Apply 1 application topically as needed for muscle pain. Patient not taking: Reported on 10/25/2024    [provider]     Care time: 40 minutes     Inge Lecher, AGACNP-BC [facility] Prefer epic messenger for cross cover needs If after hours, please call E-link

## 2024-10-31 DIAGNOSIS — G928 Other toxic encephalopathy: Secondary | ICD-10-CM

## 2024-10-31 DIAGNOSIS — I7143 Infrarenal abdominal aortic aneurysm, without rupture: Secondary | ICD-10-CM | POA: Diagnosis not present

## 2024-10-31 DIAGNOSIS — F02C11 Dementia in other diseases classified elsewhere, severe, with agitation: Secondary | ICD-10-CM

## 2024-10-31 DIAGNOSIS — E7800 Pure hypercholesterolemia, unspecified: Secondary | ICD-10-CM

## 2024-10-31 DIAGNOSIS — G309 Alzheimer's disease, unspecified: Secondary | ICD-10-CM

## 2024-10-31 DIAGNOSIS — J9601 Acute respiratory failure with hypoxia: Secondary | ICD-10-CM

## 2024-10-31 DIAGNOSIS — K922 Gastrointestinal hemorrhage, unspecified: Secondary | ICD-10-CM | POA: Diagnosis not present

## 2024-10-31 DIAGNOSIS — Z8673 Personal history of transient ischemic attack (TIA), and cerebral infarction without residual deficits: Secondary | ICD-10-CM

## 2024-10-31 LAB — GLUCOSE, CAPILLARY
Glucose-Capillary: 134 mg/dL — ABNORMAL HIGH (ref 70–99)
Glucose-Capillary: 174 mg/dL — ABNORMAL HIGH (ref 70–99)
Glucose-Capillary: 161 mg/dL — ABNORMAL HIGH (ref 70–99)
Glucose-Capillary: 151 mg/dL — ABNORMAL HIGH (ref 70–99)
Glucose-Capillary: 131 mg/dL — ABNORMAL HIGH (ref 70–99)
Glucose-Capillary: 173 mg/dL — ABNORMAL HIGH (ref 70–99)

## 2024-10-31 LAB — CBC
HCT: 28.6 % — ABNORMAL LOW (ref 39.0–52.0)
Hemoglobin: 9.2 g/dL — ABNORMAL LOW (ref 13.0–17.0)
MCH: 28.6 pg (ref 26.0–34.0)
MCHC: 32.2 g/dL (ref 30.0–36.0)
MCV: 88.8 fL (ref 80.0–100.0)
Platelets: 255 K/uL (ref 150–400)
RBC: 3.22 MIL/uL — ABNORMAL LOW (ref 4.22–5.81)
RDW: 15.5 % (ref 11.5–15.5)
WBC: 15.2 K/uL — ABNORMAL HIGH (ref 4.0–10.5)
nRBC: 0 % (ref 0.0–0.2)

## 2024-10-31 LAB — RENAL FUNCTION PANEL
Albumin: 2.9 g/dL — ABNORMAL LOW (ref 3.5–5.0)
Anion gap: 12 (ref 5–15)
BUN: 39 mg/dL — ABNORMAL HIGH (ref 8–23)
CO2: 26 mmol/L (ref 22–32)
Calcium: 8 mg/dL — ABNORMAL LOW (ref 8.9–10.3)
Chloride: 110 mmol/L (ref 98–111)
Creatinine, Ser: 1.23 mg/dL (ref 0.61–1.24)
GFR, Estimated: >60 mL/min (ref >60)
Glucose, Bld: 147 mg/dL — ABNORMAL HIGH (ref 70–99)
Phosphorus: 3 mg/dL (ref 2.5–4.6)
Potassium: 3.4 mmol/L — ABNORMAL LOW (ref 3.5–5.1)
Sodium: 148 mmol/L — ABNORMAL HIGH (ref 135–145)

## 2024-10-31 MED ORDER — CARMEX CLASSIC LIP BALM EX OINT: TOPICAL_OINTMENT | CUTANEOUS | Status: DC | PRN

## 2024-10-31 MED ORDER — LIDOCAINE HCL URETHRAL/MUCOSAL 2 % EX GEL
1.0000 | Freq: Once | CUTANEOUS | Status: AC
  Administered 2024-11-01: 1 via URETHRAL
  Filled 2024-10-31: qty 5

## 2024-10-31 MED ORDER — HYDROMORPHONE HCL 1 MG/ML IJ SOLN
1.0000 mg | Freq: Once | INTRAMUSCULAR | Status: AC
  Administered 2024-10-31: 1 mg via INTRAVENOUS
  Filled 2024-10-31: qty 1

## 2024-10-31 NOTE — Progress Notes (Signed)
 PROGRESS NOTE    Albert [name]  FMW:[id number] DOB: 26-Feb-1946 DOA: 10/24/2024 PCP: Lenon [name] ORN, MD  Chief Complaint  Patient presents with   [facility] Course:  Albert [name] is a 78 year old male with extensive past medical history including AAA with recent endovascular stent grafting 10/21/2024, dementia, Barrett's esophagus, chronic pain syndrome, history of CVA, who presented on this admission with hematemesis.  Patient has recently been on antiplatelet therapy as well as NSAIDs.  He underwent CTA which revealed active extravasation of the gastric lumen.  Vascular surgery evaluated the patient and did not feel that the bleeding was related to prior surgery.  GI was consulted and patient underwent EGD which revealed large clotted blood in the stomach and patient was unable to finish the procedure.  He was then transferred to the ICU.  Interventional radiology was then consulted and patient underwent embolization of the mid segment of the gastroepiploic artery along the greater curvature of the stomach.  Repeat EGD showed no evidence of underlying lesion or continued bleeding.  His ICU stay was then further complicated by significant agitated delirium, acute hypoxic respiratory failure due to pulmonary edema in setting of large volume resuscitation, and necessitated NG tube feeds.  He was transferred to the [facility] service on 11/15.  Before transfer he spiked a new fever and there was concern for aspiration pneumonia, so he was started empirically on Zosyn.  Subjective: At bedside evaluation patient is awake and alert, shakes his head yes and no to questions, does not verbalize.  Denies pain  Objective: Vitals:   10/31/24 0700 10/31/24 0709 10/31/24 0800 10/31/24 1122  BP: 128/80 (!) 123/90 130/75 133/78  Pulse: 95 94 95 99  Resp: (!) 24 (!) 34 (!) 29 (!) 29  Temp:  98.6 F (37 C)  (!) 100.4 F (38 C)  TempSrc:  Axillary  Axillary  SpO2: 100% 98% 99% 97%  Weight:       Height:        Intake/Output Summary (Last 24 hours) at 10/31/2024 1309 Last data filed at 10/31/2024 1130 Gross per 24 hour  Intake 3901.91 ml  Output 1475 ml  Net 2426.91 ml   Filed Weights   10/29/24 0411 10/30/24 0434 10/31/24 0500  Weight: 87.2 kg 83.6 kg 85.2 kg    Examination: General exam: Appears calm and comfortable, NAD Respiratory system: No work of breathing, nasal cannula in place Cardiovascular system: S1 & S2 heard, RRR.  Gastrointestinal system: Abdomen is nondistended, soft and nontender.  NG tube in place. Neuro: Alert nods head yes or no to questions, following commands, does appear acutely agitated and trying to get out of bed, does not verbalize  Assessment & Plan:  Principal Problem:   GI bleeding Active Problems:   Severe Alzheimer's dementia with agitation (HCC)   GERD (gastroesophageal reflux disease)   H/O stroke without residual deficits   Hypercholesterolemia   AAA (abdominal aortic aneurysm) without rupture   Acute upper GI bleed   Upper GI bleed   Acute respiratory failure with hypoxia (HCC)   Toxic metabolic encephalopathy   Malnutrition of moderate degree    Acute upper GI bleed Acute blood loss anemia - Status post coil embolization of gastroepiploic artery on 11/8 with IR - EGD x 2 with no underlying lesion found or continued bleeding noted - Continue with IV PPI twice daily - Neurosurgery evaluated patient while still in ICU and felt that subtotal gastrectomy would be futile given  current critical illness superimposed on chronic comorbidities.  Family also elected not to pursue surgery - GI still following - Trend CBC - Transfuse for hemoglobin under 7  Acute metabolic encephalopathy Dementia - Worsened by acute ICU delirium - Expect mental status to improve now he has been transferred out of the unit.  Continue to treat infection and electrolyte derangements - Frequent reorientation - Delirium precautions - Resume home meds  Seroquel  and Aricept  - Currently also receiving Seroquel  in the morning for acute agitation.  Will continue at this current dose and titrate as needed  Acute hypoxic respiratory failure Pulmonary edema due to large volume resuscitation - Extubated 11/11 - Continue supplemental O2, wean as tolerated - DNR/DNI - Status post diuresis - Continue with bronchodilators and pulmonary toileting  Aspiration pneumonia - Initiated on Zosyn 11/14 - Still with fever today - Continue to monitor closely - Trend CBC, WBC rising today.  Dysphagia - Currently with NG tube in place - Speech therapy consulted, will feed gradually once cleared by SLP - Concern of aspiration and currently treating for aspiration pneumonia as above - Continue aspiration precautions - Monitor tube feeds closely.  Failure with preserved EF, acute exacerbation - Echocardiogram July 2025: EF 65 to 70%, grade 1 diastolic dysfunction - Acutely volume overloaded this admission secondary to large volume resuscitation required due to gastrointestinal hemorrhage - Adequately diuresed now - Continue to monitor volume status closely, additional diuresis as needed  Hypertension - Monitor blood pressures closely.  Resume home meds as able  Hyperlipidemia - Continue statin  Elevated troponin - Thought to be secondary to demand ischemia.  Troponin peaked at 148 - Plan to be on aspirin  and Plavix  at home, holding given GI bleed  AKI, resolved Hyponatremia Hypokalemia - Continue to monitor I's and O's - Follow CMP - Avoid nephrotoxic medications as able  AAA, status post stent grafting 11/5 - CV surgery reports current presentation is likely unrelated - To be on aspirin  and Plavix , holding both in light of GI bleed as above    DVT prophylaxis: SCDs only   Code Status: Limited: Do not attempt resuscitation (DNR) -DNR-LIMITED -Do Not Intubate/DNI  Disposition: Pending significant clinical resolution  Attempted to reach  patient's wife Albert [name] by phone twice today with no answer.  Left a voicemail.  No family at bedside at the time of rounding.  Consultants:  Treatment Team:  Consulting Physician: Marea Selinda RAMAN, MD Consulting Physician: Leesa Kast, DO  Procedures:    Antimicrobials:  Anti-infectives (From admission, onward)    Start     Dose/Rate Route Frequency Ordered Stop   10/30/24 1400  piperacillin-tazobactam (ZOSYN) IVPB 3.375 g        3.375 g 12.5 mL/hr over 240 Minutes Intravenous Every 8 hours 10/30/24 1119         Data Reviewed: I have personally reviewed following labs and imaging studies CBC: Recent Labs  Lab 10/28/24 0421 10/28/24 1115 10/29/24 0412 10/29/24 1618 10/30/24 0429 10/31/24 0309  WBC 12.3* 12.5* 13.5*  --  12.7* 15.2*  NEUTROABS  --  10.9*  --   --   --   --   HGB 7.7* 7.8* 7.7* 8.7* 8.2* 9.2*  HCT 23.6* 24.0* 24.0* 27.5* 25.6* 28.6*  MCV 89.4 89.9 89.2  --  90.5 88.8  PLT 154 188 193  --  232 255   Basic Metabolic Panel: Recent Labs  Lab 10/25/24 0309 10/26/24 0423 10/27/24 0359 10/28/24 0421 10/29/24 0412 10/29/24 1253 10/29/24 1618 10/30/24  0429 10/31/24 0309  NA 143 145 146* 153* 156* 156*  --  151* 148*  K 4.7 4.0 3.7 3.2* 3.9  --   --  3.4* 3.4*  CL 111 112* 115* 115* 121*  --   --  116* 110  CO2 20* 21* 23 25 27   --   --  24 26  GLUCOSE 152* 140* 112* 157* 140*  --   --  167* 147*  BUN 57* 79* 75* 54* 49*  --   --  36* 39*  CREATININE 2.08* 1.76* 1.47* 1.36* 1.12  --   --  1.08 1.23  CALCIUM  7.2* 7.2* 7.7* 8.2* 8.3*  --   --  7.8* 8.0*  MG 2.2 2.2  --  2.8* 2.8*  --   --  2.4  --   PHOS 6.1*  --  4.2 3.0 1.5*  --  2.4* 3.1 3.0   GFR: Estimated Creatinine Clearance: 56.8 mL/min (by C-G formula based on SCr of 1.23 mg/dL). Liver Function Tests: Recent Labs  Lab 10/25/24 0309 10/27/24 0359 10/28/24 0421 10/29/24 0412 10/30/24 0429 10/31/24 0309  AST 14*  --   --   --   --   --   ALT 11  --   --   --   --   --   ALKPHOS 51   --   --   --   --   --   BILITOT 0.4  --   --   --   --   --   PROT 5.6*  --   --   --   --   --   ALBUMIN 2.6* 2.4* 2.9* 2.9* 2.7* 2.9*   CBG: Recent Labs  Lab 10/30/24 1928 10/30/24 2331 10/31/24 0333 10/31/24 0711 10/31/24 1120  GLUCAP 152* 144* 134* 174* 161*    Recent Results (from the past 240 hours)  MRSA Next Gen by PCR, Nasal     Status: None   Collection Time: 10/21/24  4:26 PM   Specimen: Nasal Mucosa; Nasal Swab  Result Value Ref Range Status   MRSA by PCR Next Gen NOT DETECTED NOT DETECTED Final    Comment: (NOTE) The GeneXpert MRSA Assay (FDA approved for NASAL specimens only), is one component of a comprehensive MRSA colonization surveillance program. It is not intended to diagnose MRSA infection nor to guide or monitor treatment for MRSA infections. Test performance is not FDA approved in patients less than 76 years old. Performed at [facility], [address] [place], [place] [address]   MRSA Next Gen by PCR, Nasal     Status: None   Collection Time: 10/24/24  1:10 PM   Specimen: Nasal Mucosa; Nasal Swab  Result Value Ref Range Status   MRSA by PCR Next Gen NOT DETECTED NOT DETECTED Final    Comment: (NOTE) The GeneXpert MRSA Assay (FDA approved for NASAL specimens only), is one component of a comprehensive MRSA colonization surveillance program. It is not intended to diagnose MRSA infection nor to guide or monitor treatment for MRSA infections. Test performance is not FDA approved in patients less than 93 years old. Performed at [facility], [address] [place], [place] [address]   MRSA Next Gen by PCR, Nasal     Status: None   Collection Time: 10/30/24 11:33 AM   Specimen: Nasal Mucosa; Nasal Swab  Result Value Ref Range Status   MRSA by PCR Next Gen NOT DETECTED NOT DETECTED Final  Comment: (NOTE) The GeneXpert MRSA Assay (FDA approved for NASAL specimens only), is one component of a comprehensive MRSA colonization  surveillance program. It is not intended to diagnose MRSA infection nor to guide or monitor treatment for MRSA infections. Test performance is not FDA approved in patients less than 44 years old. Performed at [facility], [address] [place], [place] [address]      Radiology Studies: DG Chest [facility] 1 View Result Date: 10/30/2024 EXAM: 1 VIEW(S) XRAY OF THE CHEST 10/30/2024 06:09:16 AM COMPARISON: 2 days ago. CLINICAL HISTORY: Acute respiratory failure (HCC) 5626. FINDINGS: LINES, TUBES AND DEVICES: Feeding tube is seen entering stomach. Right internal jugular catheter is unchanged. LUNGS AND PLEURA: Stable bilateral interstitial densities concerning for possible edema. No pleural effusion. No pneumothorax. HEART AND MEDIASTINUM: Stable cardiomediastinal silhouette. BONES AND SOFT TISSUES: No acute osseous abnormality. IMPRESSION: 1. Stable bilateral interstitial opacities, suspicious for pulmonary edema. Electronically signed by: Lynwood Seip MD 10/30/2024 08:41 AM EST RP Workstation: [id number]    Scheduled Meds:  vitamin C  500 mg Per Tube BID   Chlorhexidine  Gluconate Cloth  6 each Topical Daily   cloNIDine  0.2 mg Per Tube Q8H   Followed by   [START ON 11/01/2024] cloNIDine  0.2 mg Per Tube BID   Followed by   NOREEN ON 11/02/2024] cloNIDine  0.2 mg Per Tube Daily   donepezil   10 mg Per Tube QHS   feeding supplement (PROSource TF20)  60 mL Per Tube Daily   free water   200 mL Per Tube Q2H   lidocaine   1 patch Transdermal Q24H   pantoprazole (PROTONIX) IV  40 mg Intravenous Q12H   QUEtiapine   25 mg Per Tube Q1200   QUEtiapine   50 mg Per Tube QHS   thiamine  100 mg Per Tube Daily   Continuous Infusions:  feeding supplement (OSMOLITE 1.5 CAL) 55 mL/hr at 10/31/24 0700   piperacillin-tazobactam (ZOSYN)  IV 12.5 mL/hr at 10/31/24 0700     LOS: 7 days  MDM: Patient is high risk for one or more organ failure.  They necessitate ongoing hospitalization for continued IV  therapies and subsequent lab monitoring. Total time spent interpreting labs and vitals, reviewing the medical record, coordinating care amongst consultants and care team members, directly assessing and discussing care with the patient and/or family: 55 min  Donnell Wion, DO [facility]  To contact the attending physician between 7A-7P please use Epic Chat. To contact the covering physician during after hours 7P-7A, please review Amion.  10/31/2024, 1:09 PM   *This document has been created with the assistance of dictation software. Please excuse typographical errors. *

## 2024-10-31 NOTE — Plan of Care (Signed)
 Discussed with patient plan of care for the evening, pain management and conditions for keeping the condom catheter off with some teach back displayed.  Will try the male urinal and toileting schedule every 2 hours or as needed.  Problem: Education: Goal: Knowledge of General Education information will improve Description: Including pain rating scale, medication(s)/side effects and non-pharmacologic comfort measures Outcome: Progressing   Problem: Health Behavior/Discharge Planning: Goal: Ability to manage health-related needs will improve Outcome: Not Progressing   Problem: Pain Managment: Goal: General experience of comfort will improve and/or be controlled Outcome: Not Progressing   Problem: Safety: Goal: Ability to remain free from injury will improve Outcome: Not Progressing

## 2024-10-31 NOTE — NC FL2 (Signed)
 [place]  MEDICAID FL2 LEVEL OF CARE FORM     IDENTIFICATION  Patient Name: Albert [name] Birthdate: August 23, 1946 Sex: male Admission Date (Current Location): 10/24/2024  [facility] and [place] Number:  Chiropodist and Address:  [facility], [address], [place], [place] [address]      Provider Number: [id number]  Attending Physician Name and Address:  Leesa Kast, DO  Relative Name and Phone Number:  Shun, Pletz (Spouse)  [phone] (Mobile)    Current Level of Care: Hospital Recommended Level of Care: Skilled Nursing Facility Prior Approval Number:    Date Approved/Denied: 10/31/24 PASRR Number: [id number]  Discharge Plan: SNF    Current Diagnoses: Patient Active Problem List   Diagnosis Date Noted   Malnutrition of moderate degree 10/28/2024   Upper GI bleed 10/27/2024   Acute respiratory failure with hypoxia (HCC) 10/27/2024   Toxic metabolic encephalopathy 10/27/2024   GI bleeding 10/24/2024   Acute upper GI bleed 10/24/2024   AAA (abdominal aortic aneurysm) without rupture 08/28/2024   Acute lacunar stroke (HCC) 06/23/2024   Severe Alzheimer's dementia with agitation (HCC) 06/24/2020   Toe fracture 02/22/2020   Displaced fracture of proximal phalanx of right lesser toe(s), initial encounter for closed fracture 07/30/2019   H/O stroke without residual deficits 03/03/2018   Prediabetes 08/01/2017   Anxiety 12/26/2016   Common migraine 12/26/2016   GERD (gastroesophageal reflux disease) 12/26/2016   Health care maintenance 12/26/2016   HTN, goal below 140/90 12/26/2016   Hypercholesterolemia 12/26/2016   Degenerative spondylolisthesis 03/13/2016    Orientation RESPIRATION BLADDER Height & Weight     Self, Place  Normal Continent Weight: 187 lb 13.3 oz (85.2 kg) Height:  6' 1 (185.4 cm)  BEHAVIORAL SYMPTOMS/MOOD NEUROLOGICAL BOWEL NUTRITION STATUS     (DEMENTIA) Continent Diet (Diet NPO time specified:  NPO starting at 11/08 1146)  AMBULATORY STATUS COMMUNICATION OF NEEDS Skin   Extensive Assist Verbally Normal                       Personal Care Assistance Level of Assistance  Bathing, Feeding, Dressing Bathing Assistance: Maximum assistance Feeding assistance: Limited assistance Dressing Assistance: Maximum assistance     Functional Limitations Info  Sight, Hearing, Speech          SPECIAL CARE FACTORS FREQUENCY  PT (By licensed PT), OT (By licensed OT)     PT Frequency: 1X OT Frequency: 2X            Contractures Contractures Info: Not present    Additional Factors Info  Code Status, Allergies Code Status Info: DNR-Limited Allergies Info: Codeine           Current Medications (10/31/2024):  This is the current hospital active medication list Current Facility-Administered Medications  Medication Dose Route Frequency Provider Last Rate Last Admin   acetaminophen  (TYLENOL ) tablet 650 mg  650 mg Per Tube Q6H PRN Isadora Hose, MD   650 mg at 10/31/24 0340   Or   acetaminophen  (TYLENOL ) suppository 650 mg  650 mg Rectal Q6H PRN Isadora Hose, MD       ascorbic acid (VITAMIN C) tablet 500 mg  500 mg Per Tube BID Isadora Hose, MD   500 mg at 10/31/24 9046   Chlorhexidine  Gluconate Cloth 2 % PADS 6 each  6 each Topical Daily Parris Manna, MD   6 each at 10/31/24 1214   cloNIDine (CATAPRES) tablet 0.2 mg  0.2 mg Per Tube Q8H Nelson, Dana  G, NP   0.2 mg at 10/31/24 1353   Followed by   NOREEN ON 11/01/2024] cloNIDine (CATAPRES) tablet 0.2 mg  0.2 mg Per Tube BID Nelson, Dana G, NP       Followed by   NOREEN ON 11/02/2024] cloNIDine (CATAPRES) tablet 0.2 mg  0.2 mg Per Tube Daily Nelson, Dana G, NP       donepezil  (ARICEPT ) tablet 10 mg  10 mg Per Tube QHS Nelson, Dana G, NP   10 mg at 10/30/24 2118   feeding supplement (OSMOLITE 1.5 CAL) liquid 1,000 mL  1,000 mL Per Tube Continuous Dgayli, Khabib, MD 55 mL/hr at 10/31/24 0700 Infusion Verify at 10/31/24  0700   feeding supplement (PROSource TF20) liquid 60 mL  60 mL Per Tube Daily Isadora Hose, MD   60 mL at 10/31/24 0953   free water  200 mL  200 mL Per Tube Q2H Dgayli, Khabib, MD   200 mL at 10/31/24 1130   guaiFENesin (ROBITUSSIN) 100 MG/5ML liquid 5 mL  5 mL Per Tube Q4H PRN Nelson, Dana G, NP   5 mL at 10/31/24 0201   haloperidol  lactate (HALDOL ) injection 5 mg  5 mg Intravenous Q6H PRN Isadora Hose, MD   5 mg at 10/29/24 0406   ipratropium-albuterol (DUONEB) 0.5-2.5 (3) MG/3ML nebulizer solution 3 mL  3 mL Nebulization Q4H PRN Keene, Jeremiah D, NP   3 mL at 10/28/24 1245   lidocaine  (LIDODERM ) 5 % 1 patch  1 patch Transdermal Q24H Nelson, Dana G, NP   1 patch at 10/31/24 0942   lip balm (CARMEX) ointment   Topical PRN Dezii, Alexandra, DO       ondansetron  (ZOFRAN ) tablet 4 mg  4 mg Oral Q6H PRN Roann Gouty, MD       Or   ondansetron  (ZOFRAN ) injection 4 mg  4 mg Intravenous Q6H PRN Paudel, Keshab, MD   4 mg at 10/24/24 1105   Oral care mouth rinse  15 mL Mouth Rinse PRN Aleskerov, Fuad, MD       pantoprazole (PROTONIX) injection 40 mg  40 mg Intravenous Q12H Aleskerov, Halina, MD   40 mg at 10/31/24 0953   piperacillin-tazobactam (ZOSYN) IVPB 3.375 g  3.375 g Intravenous Q8H Nazari, Walid A, RPH 12.5 mL/hr at 10/31/24 1354 3.375 g at 10/31/24 1354   polyethylene glycol (MIRALAX / GLYCOLAX) packet 17 g  17 g Oral Daily PRN Paudel, Keshab, MD       QUEtiapine  (SEROQUEL ) tablet 25 mg  25 mg Per Tube Q1200 Nelson, Dana G, NP   25 mg at 10/31/24 1235   QUEtiapine  (SEROQUEL ) tablet 50 mg  50 mg Per Tube QHS Clair Rue B, RPH   50 mg at 10/30/24 2118   thiamine (VITAMIN B1) tablet 100 mg  100 mg Per Tube Daily Park, Nathaniel C, RPH   100 mg at 10/31/24 9045     Discharge Medications: Please see discharge summary for a list of discharge medications.  Relevant Imaging Results:  Relevant Lab Results:   Additional Information [id number]  Jeffrie Lofstrom L Kian Ottaviano, [place]

## 2024-11-01 DIAGNOSIS — K922 Gastrointestinal hemorrhage, unspecified: Secondary | ICD-10-CM | POA: Diagnosis not present

## 2024-11-01 DIAGNOSIS — I7143 Infrarenal abdominal aortic aneurysm, without rupture: Secondary | ICD-10-CM | POA: Diagnosis not present

## 2024-11-01 DIAGNOSIS — Z8673 Personal history of transient ischemic attack (TIA), and cerebral infarction without residual deficits: Secondary | ICD-10-CM | POA: Diagnosis not present

## 2024-11-01 DIAGNOSIS — E7800 Pure hypercholesterolemia, unspecified: Secondary | ICD-10-CM | POA: Diagnosis not present

## 2024-11-01 LAB — COMPREHENSIVE METABOLIC PANEL WITH GFR
ALT: 77 U/L — ABNORMAL HIGH (ref 0–44)
AST: 55 U/L — ABNORMAL HIGH (ref 15–41)
Albumin: 2.8 g/dL — ABNORMAL LOW (ref 3.5–5.0)
Alkaline Phosphatase: 126 U/L (ref 38–126)
Anion gap: 13 (ref 5–15)
BUN: 39 mg/dL — ABNORMAL HIGH (ref 8–23)
CO2: 27 mmol/L (ref 22–32)
Calcium: 7.8 mg/dL — ABNORMAL LOW (ref 8.9–10.3)
Chloride: 111 mmol/L (ref 98–111)
Creatinine, Ser: 1.33 mg/dL — ABNORMAL HIGH (ref 0.61–1.24)
GFR, Estimated: 55 mL/min — ABNORMAL LOW (ref >60)
Glucose, Bld: 111 mg/dL — ABNORMAL HIGH (ref 70–99)
Potassium: 3.3 mmol/L — ABNORMAL LOW (ref 3.5–5.1)
Sodium: 152 mmol/L — ABNORMAL HIGH (ref 135–145)
Total Bilirubin: 0.4 mg/dL (ref 0.0–1.2)
Total Protein: 5.4 g/dL — ABNORMAL LOW (ref 6.5–8.1)

## 2024-11-01 LAB — GLUCOSE, CAPILLARY
Glucose-Capillary: 131 mg/dL — ABNORMAL HIGH (ref 70–99)
Glucose-Capillary: 149 mg/dL — ABNORMAL HIGH (ref 70–99)
Glucose-Capillary: 137 mg/dL — ABNORMAL HIGH (ref 70–99)
Glucose-Capillary: 139 mg/dL — ABNORMAL HIGH (ref 70–99)
Glucose-Capillary: 124 mg/dL — ABNORMAL HIGH (ref 70–99)

## 2024-11-01 LAB — URINALYSIS, COMPLETE (UACMP) WITH MICROSCOPIC
Bilirubin Urine: NEGATIVE
Glucose, UA: NEGATIVE mg/dL
Ketones, ur: NEGATIVE mg/dL
Leukocytes,Ua: NEGATIVE
Nitrite: NEGATIVE
Protein, ur: 100 mg/dL — AB
RBC / HPF: >50 RBC/hpf (ref 0–5)
Specific Gravity, Urine: 1.02 (ref 1.005–1.030)
Squamous Epithelial / HPF: 0 /HPF (ref 0–5)
WBC, UA: >50 WBC/hpf (ref 0–5)
pH: 6 (ref 5.0–8.0)

## 2024-11-01 LAB — PHOSPHORUS: Phosphorus: 4.1 mg/dL (ref 2.5–4.6)

## 2024-11-01 LAB — CBC WITH DIFFERENTIAL/PLATELET
Abs Immature Granulocytes: 0.08 K/uL — ABNORMAL HIGH (ref 0.00–0.07)
Basophils Absolute: 0 K/uL (ref 0.0–0.1)
Basophils Relative: 0 %
Eosinophils Absolute: 0.4 K/uL (ref 0.0–0.5)
Eosinophils Relative: 3 %
HCT: 27.3 % — ABNORMAL LOW (ref 39.0–52.0)
Hemoglobin: 8.4 g/dL — ABNORMAL LOW (ref 13.0–17.0)
Immature Granulocytes: 1 %
Lymphocytes Relative: 11 %
Lymphs Abs: 1.5 K/uL (ref 0.7–4.0)
MCH: 28.3 pg (ref 26.0–34.0)
MCHC: 30.8 g/dL (ref 30.0–36.0)
MCV: 91.9 fL (ref 80.0–100.0)
Monocytes Absolute: 1.1 K/uL — ABNORMAL HIGH (ref 0.1–1.0)
Monocytes Relative: 8 %
Neutro Abs: 10.4 K/uL — ABNORMAL HIGH (ref 1.7–7.7)
Neutrophils Relative %: 77 %
Platelets: 285 K/uL (ref 150–400)
RBC: 2.97 MIL/uL — ABNORMAL LOW (ref 4.22–5.81)
RDW: 15.3 % (ref 11.5–15.5)
WBC: 13.5 K/uL — ABNORMAL HIGH (ref 4.0–10.5)
nRBC: 0 % (ref 0.0–0.2)

## 2024-11-01 LAB — MAGNESIUM: Magnesium: 2.7 mg/dL — ABNORMAL HIGH (ref 1.7–2.4)

## 2024-11-01 MED ORDER — TAMSULOSIN HCL 0.4 MG PO CAPS
0.4000 mg | ORAL_CAPSULE | Freq: Every day | ORAL | Status: DC
  Administered 2024-11-01 – 2024-11-09 (×10): 0.4 mg via ORAL
  Filled 2024-11-01 (×10): qty 1

## 2024-11-01 MED ORDER — HYDROMORPHONE HCL 1 MG/ML IJ SOLN
0.5000 mg | Freq: Four times a day (QID) | INTRAMUSCULAR | Status: DC | PRN
  Administered 2024-11-01 – 2024-11-04 (×6): 0.5 mg via INTRAVENOUS
  Filled 2024-11-01: qty 0.5
  Filled 2024-11-01: qty 1
  Filled 2024-11-01 (×4): qty 0.5

## 2024-11-01 NOTE — Progress Notes (Signed)
 During the night patient got an in and out catheter for bladder scan >400 ml.  It was dark blood at the end when pulling it out.  If he needs another in and out catheter please order the Urojet with it for his pain.  He still complains of burning and have difficulty starting his stream.  He is a two person assist to stand and urinate.  MD on-call added Flomax to his scheduled medication first dose given earlier this AM and next dose due at 1000.  Please try and get a recliner for the room to have him sit up today.  Please, re-evaluate him for the safety sitter throughout this morning.

## 2024-11-01 NOTE — Progress Notes (Signed)
 PROGRESS NOTE    Albert [name]  FMW:[id number] DOB: Sep 21, 1946 DOA: 10/24/2024 PCP: Lenon [name] ORN, MD  Chief Complaint  Patient presents with   [facility] Course:  Albert [name] is a 78 year old male with extensive past medical history including AAA with recent endovascular stent grafting 10/21/2024, dementia, Barrett's esophagus, chronic pain syndrome, history of CVA, who presented on this admission with hematemesis.  Patient has recently been on antiplatelet therapy as well as NSAIDs.  He underwent CTA which revealed active extravasation of the gastric lumen.  Vascular surgery evaluated the patient and did not feel that the bleeding was related to prior surgery.  GI was consulted and patient underwent EGD which revealed large clotted blood in the stomach and patient was unable to finish the procedure.  He was then transferred to the ICU.  Interventional radiology was then consulted and patient underwent embolization of the mid segment of the gastroepiploic artery along the greater curvature of the stomach.  Repeat EGD showed no evidence of underlying lesion or continued bleeding.  His ICU stay was then further complicated by significant agitated delirium, acute hypoxic respiratory failure due to pulmonary edema in setting of large volume resuscitation, and necessitated NG tube feeds.  He was transferred to the [facility] service on 11/15.  Before transfer he spiked a new fever and there was concern for aspiration pneumonia, so he was started empirically on Zosyn.  Subjective: This morning patient is more alert.  He remains disoriented, denies any acute pain.  No family at bedside during the time my evaluation  Spoke with his wife directly on the phone, no questions at this time.  Care plan was discussed.  Objective: Vitals:   11/01/24 1330 11/01/24 1345 11/01/24 1400 11/01/24 1420  BP:   136/75 135/74  Pulse: 97 94 98 98  Resp: (!) 27 (!) 28 (!) 26 20  Temp:    98.4 F (36.9  C)  TempSrc:    Oral  SpO2: 99% 98% 99% 100%  Weight:      Height:        Intake/Output Summary (Last 24 hours) at 11/01/2024 1626 Last data filed at 11/01/2024 1440 Gross per 24 hour  Intake 4029.88 ml  Output 1477 ml  Net 2552.88 ml   Filed Weights   10/30/24 0434 10/31/24 0500 11/01/24 0402  Weight: 83.6 kg 85.2 kg 86.2 kg    Examination: General exam: Appears calm and comfortable, NAD Respiratory system: No work of breathing, nasal cannula in place Cardiovascular system: S1 & S2 heard, RRR.  Gastrointestinal system: Abdomen is nondistended, soft and nontender.  NG tube in place. Neuro: Awake and alert, talkative, following some commands, disoriented.  Assessment & Plan:  Principal Problem:   GI bleeding Active Problems:   Severe Alzheimer's dementia with agitation (HCC)   GERD (gastroesophageal reflux disease)   H/O stroke without residual deficits   Hypercholesterolemia   AAA (abdominal aortic aneurysm) without rupture   Acute upper GI bleed   Upper GI bleed   Acute respiratory failure with hypoxia (HCC)   Toxic metabolic encephalopathy   Malnutrition of moderate degree    Acute upper GI bleed Acute blood loss anemia - Status post coil embolization of gastroepiploic artery on 11/8 with IR - EGD x 2 with no underlying lesion found or continued bleeding noted - Continue with IV PPI twice daily - Neurosurgery evaluated patient while still in ICU and felt that subtotal gastrectomy would be futile given current  critical illness superimposed on chronic comorbidities.  Family also elected not to pursue surgery -Continue trending CBC, transfuse for hemoglobin under 7  Acute metabolic encephalopathy Dementia - Worsened by acute ICU delirium - Expect mental status to improve now he has been transferred out of the unit.  Continue to treat infection and electrolyte derangements - At baseline he is ambulatory and has quality of life at home.  He is not oriented to year  and date at baseline. - His wife is reasonable and her expectations that the patient will continue to have cognitive decline and unlikely to return to his baseline after this hospitalization - Continue frequent reorientation - Delirium precautions - Resume home meds Seroquel  and Aricept  - Currently also receiving Seroquel  in the morning for acute agitation.  Will continue at this current dose and titrate as needed - Is on clonidine taper from ICU.  Continue  Acute hypoxic respiratory failure Pulmonary edema due to large volume resuscitation - Extubated 11/11 - Continue supplemental O2, wean as tolerated - DNR/DNI - Status post diuresis - Continue with bronchodilators and pulmonary toileting  Aspiration pneumonia - Initiated on Zosyn 11/14 - WBC downtrending now - Continue to monitor fever curve  Dysphagia - Currently with NG tube in place - Speech therapy consulted, unfortunately patient was unable to participate in evaluation today.  Will try again tomorrow. - Will feed gradually once cleared by SLP - Concern of aspiration and currently treating for aspiration pneumonia as above - Continue aspiration precautions - Monitor tube feeds closely.  Failure with preserved EF, acute exacerbation - Echocardiogram July 2025: EF 65 to 70%, grade 1 diastolic dysfunction - Acutely volume overloaded this admission secondary to large volume resuscitation required due to gastrointestinal hemorrhage - Adequately diuresed now - Continue to monitor volume status closely, additional diuresis as needed  Hypertension - Monitor blood pressures closely.  Resume home meds as able  Hyperlipidemia - Continue statin  Elevated troponin - Thought to be secondary to demand ischemia.  Troponin peaked at 148 - Plan to be on aspirin  and Plavix  at home, holding given GI bleed  AKI, resolved Hyponatremia Hypokalemia - Continue to monitor I's and O's - Follow CMP - Avoid nephrotoxic medications as  able  AAA, status post stent grafting 11/5 - CV surgery reports current presentation is likely unrelated - To be on aspirin  and Plavix , holding both in light of GI bleed as above    DVT prophylaxis: SCDs only   Code Status: Limited: Do not attempt resuscitation (DNR) -DNR-LIMITED -Do Not Intubate/DNI  Disposition: Pending significant clinical resolution   Consultants:  Treatment Team:  Consulting Physician: Marea Selinda RAMAN, MD Consulting Physician: Leesa Kast, DO  Procedures:    Antimicrobials:  Anti-infectives (From admission, onward)    Start     Dose/Rate Route Frequency Ordered Stop   10/30/24 1400  piperacillin-tazobactam (ZOSYN) IVPB 3.375 g        3.375 g 12.5 mL/hr over 240 Minutes Intravenous Every 8 hours 10/30/24 1119         Data Reviewed: I have personally reviewed following labs and imaging studies CBC: Recent Labs  Lab 10/28/24 1115 10/29/24 0412 10/29/24 1618 10/30/24 0429 10/31/24 0309 11/01/24 0315  WBC 12.5* 13.5*  --  12.7* 15.2* 13.5*  NEUTROABS 10.9*  --   --   --   --  10.4*  HGB 7.8* 7.7* 8.7* 8.2* 9.2* 8.4*  HCT 24.0* 24.0* 27.5* 25.6* 28.6* 27.3*  MCV 89.9 89.2  --  90.5 88.8  91.9  PLT 188 193  --  232 255 285   Basic Metabolic Panel: Recent Labs  Lab 10/26/24 0423 10/27/24 0359 10/28/24 0421 10/29/24 0412 10/29/24 1253 10/29/24 1618 10/30/24 0429 10/31/24 0309 11/01/24 0315  NA 145   < > 153* 156* 156*  --  151* 148* 152*  K 4.0   < > 3.2* 3.9  --   --  3.4* 3.4* 3.3*  CL 112*   < > 115* 121*  --   --  116* 110 111  CO2 21*   < > 25 27  --   --  24 26 27   GLUCOSE 140*   < > 157* 140*  --   --  167* 147* 111*  BUN 79*   < > 54* 49*  --   --  36* 39* 39*  CREATININE 1.76*   < > 1.36* 1.12  --   --  1.08 1.23 1.33*  CALCIUM  7.2*   < > 8.2* 8.3*  --   --  7.8* 8.0* 7.8*  MG 2.2  --  2.8* 2.8*  --   --  2.4  --  2.7*  PHOS  --    < > 3.0 1.5*  --  2.4* 3.1 3.0 4.1   < > = values in this interval not displayed.    GFR: Estimated Creatinine Clearance: 52.6 mL/min (A) (by C-G formula based on SCr of 1.33 mg/dL (H)). Liver Function Tests: Recent Labs  Lab 10/28/24 0421 10/29/24 0412 10/30/24 0429 10/31/24 0309 11/01/24 0315  AST  --   --   --   --  55*  ALT  --   --   --   --  77*  ALKPHOS  --   --   --   --  126  BILITOT  --   --   --   --  0.4  PROT  --   --   --   --  5.4*  ALBUMIN 2.9* 2.9* 2.7* 2.9* 2.8*   CBG: Recent Labs  Lab 10/31/24 2314 11/01/24 0308 11/01/24 0756 11/01/24 1109 11/01/24 1215  GLUCAP 173* 131* 149* 137* 139*    Recent Results (from the past 240 hours)  MRSA Next Gen by PCR, Nasal     Status: None   Collection Time: 10/24/24  1:10 PM   Specimen: Nasal Mucosa; Nasal Swab  Result Value Ref Range Status   MRSA by PCR Next Gen NOT DETECTED NOT DETECTED Final    Comment: (NOTE) The GeneXpert MRSA Assay (FDA approved for NASAL specimens only), is one component of a comprehensive MRSA colonization surveillance program. It is not intended to diagnose MRSA infection nor to guide or monitor treatment for MRSA infections. Test performance is not FDA approved in patients less than 36 years old. Performed at [facility], [address] [place], [place] [address]   MRSA Next Gen by PCR, Nasal     Status: None   Collection Time: 10/30/24 11:33 AM   Specimen: Nasal Mucosa; Nasal Swab  Result Value Ref Range Status   MRSA by PCR Next Gen NOT DETECTED NOT DETECTED Final    Comment: (NOTE) The GeneXpert MRSA Assay (FDA approved for NASAL specimens only), is one component of a comprehensive MRSA colonization surveillance program. It is not intended to diagnose MRSA infection nor to guide or monitor treatment for MRSA infections. Test performance is not FDA approved in patients less than 66 years old. Performed at [facility],  [address] [place], [place] [address]      Radiology Studies: No results found.   Scheduled Meds:   vitamin C  500 mg Per Tube BID   Chlorhexidine  Gluconate Cloth  6 each Topical Daily   cloNIDine  0.2 mg Per Tube BID   Followed by   Albert [name] ON 11/02/2024] cloNIDine  0.2 mg Per Tube Daily   donepezil   10 mg Per Tube QHS   feeding supplement (PROSource TF20)  60 mL Per Tube Daily   free water   200 mL Per Tube Q2H   lidocaine   1 patch Transdermal Q24H   pantoprazole (PROTONIX) IV  40 mg Intravenous Q12H   QUEtiapine   25 mg Per Tube Q1200   QUEtiapine   50 mg Per Tube QHS   tamsulosin  0.4 mg Oral QPC supper   thiamine  100 mg Per Tube Daily   Continuous Infusions:  feeding supplement (OSMOLITE 1.5 CAL) 1,000 mL (11/01/24 1440)   piperacillin-tazobactam (ZOSYN)  IV 12.5 mL/hr at 11/01/24 1434     LOS: 8 days  MDM: Patient is high risk for one or more organ failure.  They necessitate ongoing hospitalization for continued IV therapies and subsequent lab monitoring. Total time spent interpreting labs and vitals, reviewing the medical record, coordinating care amongst consultants and care team members, directly assessing and discussing care with the patient and/or family: 55 min  Phoenicia Pirie, DO [facility]  To contact the attending physician between 7A-7P please use Epic Chat. To contact the covering physician during after hours 7P-7A, please review Amion.  11/01/2024, 4:26 PM   *This document has been created with the assistance of dictation software. Please excuse typographical errors. *

## 2024-11-01 NOTE — Progress Notes (Signed)
 Physical Therapy Treatment Patient Details Name: Albert [name] MRN: [id number] DOB: October 06, 1946 Today's Date: 11/01/2024   History of Present Illness 78 year old male admitted with acute blood loss anemia secondary to upper GI bleed status post coil embolization of the gastroepiploic artery on 11/8 with EGD twice not show any underlying lesion or source of continued bleed. Pt intubated for hemorraghic shock then extubated to  on 10/27/2024. MD dx include: acute metabolic encephalopathy complicated by dementia, HFpEF, HTN, HLD, acute hypoxic respiratory failure, pulmonary edema, acute blood loss anemia, hypernatremia.    PT Comments  Pt in bed with safety sitter in room.  Mits in place.  While pt does answer brief questions with short answers he quickly falls back asleep.  BLE PROM with encouragement to participate.  He does briefly wiggle his toes but does seem to fall quickly back to sleep despite eyes being open.   Pt appropriate for [facility] or ceiling lift for OOB needs at this time.  Secure chat with RN.   If plan is discharge home, recommend the following: Two people to help with walking and/or transfers;Two people to help with bathing/dressing/bathroom;Assistance with cooking/housework;Direct supervision/assist for medications management;Direct supervision/assist for financial management;Assist for transportation;Supervision due to cognitive status   Can travel by private vehicle        Equipment Recommendations       Recommendations for Other Services       Precautions / Restrictions Precautions Precautions: Fall Recall of Precautions/Restrictions: Impaired Restrictions Weight Bearing Restrictions Per Provider Order: No     Mobility  Bed Mobility                    Transfers                        Ambulation/Gait                   Stairs             Wheelchair Mobility     Tilt Bed    Modified Rankin (Stroke Patients Only)        Balance                                            Communication Communication Communication: Impaired Factors Affecting Communication: Difficulty expressing self;Reduced clarity of speech  Cognition Arousal: Lethargic     PT - Cognitive impairments: History of cognitive impairments, No family/caregiver present to determine baseline                         Following commands: Impaired Following commands impaired: Follows one step commands inconsistently    Cueing Cueing Techniques: Verbal cues, Tactile cues  Exercises Other Exercises Other Exercises: BLE supine PROM.  little assist from pt for activity    General Comments        Pertinent Vitals/Pain Pain Assessment Pain Assessment: No/denies pain    Home Living                          Prior Function            PT Goals (current goals can now be found in the care plan section) Progress towards PT goals: Not progressing toward goals - comment    Frequency  Min 1X/week      PT Plan      Co-evaluation              AM-PAC PT 6 Clicks Mobility   Outcome Measure  Help needed turning from your back to your side while in a flat bed without using bedrails?: Total Help needed moving from lying on your back to sitting on the side of a flat bed without using bedrails?: Total Help needed moving to and from a bed to a chair (including a wheelchair)?: Total Help needed standing up from a chair using your arms (e.g., wheelchair or bedside chair)?: Total Help needed to walk in hospital room?: Total Help needed climbing 3-5 steps with a railing? : Total 6 Click Score: 6    End of Session   Activity Tolerance: Patient limited by fatigue Patient left: in bed;with bed alarm set;with call bell/phone within reach;with nursing/sitter in room Nurse Communication: Mobility status PT Visit Diagnosis: Unsteadiness on feet (R26.81);Muscle weakness (generalized)  (M62.81);Difficulty in walking, not elsewhere classified (R26.2)     Time: 1300-1310 PT Time Calculation (min) (ACUTE ONLY): 10 min  Charges:    $Therapeutic Exercise: 8-22 mins PT General Charges $$ ACUTE PT VISIT: 1 Visit                   Lauraine Gills, PTA 11/01/24, 1:59 PM

## 2024-11-01 NOTE — Progress Notes (Signed)
 SLP Cancellation Note  Patient Details Name: Albert [name] MRN: [id number] DOB: 27-May-1946   Cancelled treatment:       Reason Eval/Treat Not Completed: Medical issues which prohibited therapy  Pt with high RR (30's), difficulty attending to nurse and this clinical research associate. His nurse reports recent administration of dilaudid . Will re-attempt at next available time when pt is better able to participate.   Tawsha Terrero B. Rubbie, M.S., CCC-SLP, CBIS Speech-Language Pathologist Certified Brain Injury Specialist [facility]  [facility] [id number] Ascom [phone] Fax [phone]    Claudetta Rubbie 11/01/2024, 1:52 PM

## 2024-11-02 DIAGNOSIS — K922 Gastrointestinal hemorrhage, unspecified: Secondary | ICD-10-CM | POA: Diagnosis not present

## 2024-11-02 DIAGNOSIS — E7800 Pure hypercholesterolemia, unspecified: Secondary | ICD-10-CM | POA: Diagnosis not present

## 2024-11-02 DIAGNOSIS — Z8673 Personal history of transient ischemic attack (TIA), and cerebral infarction without residual deficits: Secondary | ICD-10-CM | POA: Diagnosis not present

## 2024-11-02 DIAGNOSIS — I7143 Infrarenal abdominal aortic aneurysm, without rupture: Secondary | ICD-10-CM | POA: Diagnosis not present

## 2024-11-02 LAB — CBC WITH DIFFERENTIAL/PLATELET
Abs Immature Granulocytes: 0.08 K/uL — ABNORMAL HIGH (ref 0.00–0.07)
Basophils Absolute: 0 K/uL (ref 0.0–0.1)
Basophils Relative: 0 %
Eosinophils Absolute: 0.4 K/uL (ref 0.0–0.5)
Eosinophils Relative: 4 %
HCT: 26 % — ABNORMAL LOW (ref 39.0–52.0)
Hemoglobin: 8 g/dL — ABNORMAL LOW (ref 13.0–17.0)
Immature Granulocytes: 1 %
Lymphocytes Relative: 14 %
Lymphs Abs: 1.5 K/uL (ref 0.7–4.0)
MCH: 28.2 pg (ref 26.0–34.0)
MCHC: 30.8 g/dL (ref 30.0–36.0)
MCV: 91.5 fL (ref 80.0–100.0)
Monocytes Absolute: 0.7 K/uL (ref 0.1–1.0)
Monocytes Relative: 6 %
Neutro Abs: 8.2 K/uL — ABNORMAL HIGH (ref 1.7–7.7)
Neutrophils Relative %: 75 %
Platelets: 348 K/uL (ref 150–400)
RBC: 2.84 MIL/uL — ABNORMAL LOW (ref 4.22–5.81)
RDW: 15.2 % (ref 11.5–15.5)
WBC: 10.9 K/uL — ABNORMAL HIGH (ref 4.0–10.5)
nRBC: 0 % (ref 0.0–0.2)

## 2024-11-02 LAB — COMPREHENSIVE METABOLIC PANEL WITH GFR
ALT: 106 U/L — ABNORMAL HIGH (ref 0–44)
AST: 98 U/L — ABNORMAL HIGH (ref 15–41)
Albumin: 2.7 g/dL — ABNORMAL LOW (ref 3.5–5.0)
Alkaline Phosphatase: 120 U/L (ref 38–126)
Anion gap: 9 (ref 5–15)
BUN: 31 mg/dL — ABNORMAL HIGH (ref 8–23)
CO2: 26 mmol/L (ref 22–32)
Calcium: 7.7 mg/dL — ABNORMAL LOW (ref 8.9–10.3)
Chloride: 110 mmol/L (ref 98–111)
Creatinine, Ser: 1.11 mg/dL (ref 0.61–1.24)
GFR, Estimated: >60 mL/min (ref >60)
Glucose, Bld: 140 mg/dL — ABNORMAL HIGH (ref 70–99)
Potassium: 3.6 mmol/L (ref 3.5–5.1)
Sodium: 145 mmol/L (ref 135–145)
Total Bilirubin: 0.3 mg/dL (ref 0.0–1.2)
Total Protein: 5.2 g/dL — ABNORMAL LOW (ref 6.5–8.1)

## 2024-11-02 LAB — PHOSPHORUS: Phosphorus: 3.4 mg/dL (ref 2.5–4.6)

## 2024-11-02 LAB — MAGNESIUM: Magnesium: 2.7 mg/dL — ABNORMAL HIGH (ref 1.7–2.4)

## 2024-11-02 NOTE — Evaluation (Addendum)
 Clinical/Bedside Swallow Evaluation Patient Details  Name: Albert [name] MRN: [id number] Date of Birth: [birth year]  Today's Date: 11/02/2024 Time: SLP Start Time (ACUTE ONLY): 1335 SLP Stop Time (ACUTE ONLY): 1425 SLP Time Calculation (min) (ACUTE ONLY): 50 min  Past Medical History:  Past Medical History:  Diagnosis Date   Anemia    Anxiety    a.) on BZO (alproazolam) PRN   Arthritis of both knees    Back pain    Barrett esophagus    Chronic back pain    Dementia (HCC)    arising in the senium and presenium   Displaced fracture of proximal phalanx of right lesser toe(s), initial encounter for closed fracture    Diverticulosis    GERD (gastroesophageal reflux disease)    OTC meds as needed   Headache    History of colon polyps    benign    History of kidney stones    History of migraine    Hyperlipidemia    Hypertension    Insomnia    Joint pain    Nephrolithiasis    Pre-diabetes    Stroke [facility])    without residual deficits   Past Surgical History:  Past Surgical History:  Procedure Laterality Date   AMPUTATION TOE Right 04/13/2022   Procedure: 28820 - AMPUTATION TOE;  Surgeon: Ashley Soulier, DPM;  Location: [facility] ORS;  Service: Podiatry;  Laterality: Right;   ANKLE ARTHROSCOPY Right 04/13/2022   Procedure: A-SCOPE/OCD REPAIR;  Surgeon: Ashley Soulier, DPM;  Location: [facility] ORS;  Service: Podiatry;  Laterality: Right;   ANKLE SURGERY Right 1997   ankles/screws    ARTHROSCOPIC REPAIR ACL Right 1993   BACK SURGERY  2017   titanium rods and pins in lower back   COLONOSCOPY     ENDOVASCULAR STENT GRAFT (AAA) N/A 10/21/2024   Procedure: ENDOVASCULAR STENT GRAFT (AAA);  Surgeon: Marea Selinda RAMAN, MD;  Location: [facility] INVASIVE CV LAB;  Service: Cardiovascular;  Laterality: N/A;   EPIDIDYMECTOMY Left 04/10/2017   Procedure: EPIDIDYMECTOMY;  Surgeon: Rosina Riis, MD;  Location: [facility] ORS;  Service: Urology;  Laterality: Left;   ESOPHAGOGASTRODUODENOSCOPY      ESOPHAGOGASTRODUODENOSCOPY N/A 10/24/2024   Procedure: EGD (ESOPHAGOGASTRODUODENOSCOPY);  Surgeon: Onita Elspeth Sharper, DO;  Location[facility] ENDOSCOPY;  Service: Gastroenterology;  Laterality: N/A;   ESOPHAGOGASTRODUODENOSCOPY N/A 10/25/2024   Procedure: EGD (ESOPHAGOGASTRODUODENOSCOPY);  Surgeon: Onita Elspeth Sharper, DO;  Location: [facility] ENDOSCOPY;  Service: Gastroenterology;  Laterality: N/A;  in ICU   IR EMBO ART  VEN HEMORR LYMPH EXTRAV  INC GUIDE ROADMAPPING  10/24/2024   LUMBAR FUSION  March 13, 2016   L4/L5 fusion   MINOR HARDWARE REMOVAL Right 04/13/2022   Procedure: REMOVAL SCREW; DEEP;  Surgeon: Ashley Soulier, DPM;  Location: [facility] ORS;  Service: Podiatry;  Laterality: Right;   TONSILLECTOMY  1962   adenoidectomy    VASECTOMY  1973   WOUND DEBRIDEMENT Right 04/13/2022   Procedure: 29906 - SUBTALAR ARTHROSCOPY;  Surgeon: Ashley Soulier, DPM;  Location: [facility] ORS;  Service: Podiatry;  Laterality: Right;   HPI:  Pt is a 78 year old male admitted with acute blood loss anemia secondary to upper GI bleed status post coil embolization of the gastroepiploic artery on 11/8 with EGD twice not show any underlying lesion or source of continued bleed. Pt intubated for hemorraghic shock then extubated to [place] on 10/27/2024.   PMH includes:  Dementia, acute metabolic encephalopathy, HFpEF, HTN, HLD, acute hypoxic respiratory failure, pulmonary edema, acute blood loss anemia, hypernatremia, GERD, malnutrition.  CXR: Stable bilateral interstitial opacities, suspicious for pulmonary edema.    Assessment / Plan / Recommendation  Clinical Impression   Pt presents w/ oropharyngeal phase dysphagia in setting of known Cognitive decline/Dementia, which significantly impacts his State and awareness during po tasks and swallowing.   Pt was seen for BSE. Wife present in room. Pt has some ongoing GI issues; GI is following for ongoing management/intervention. Pt has a Baseline Dx of Severe Alzheimer's dementia with  agitation, per MD note. He requires Mitts and a tele-Sitter currently d/t agitation/confusion. Wife has reported that pt's Dementia may worsening. ANY Cognitive decline can impact overall awareness/timing of swallow and safety during po tasks which increases risk for aspiration, choking.  Pt appears to be able to tolerate po intake of a modified diet consistency(puree) when following strict aspiration precautions and given Full feeding assistance. He requires MAX assistance and cues during po tasks and self-feeding.        Pt consumed several trials of ice chips, purees, and thin liquids via straw (PINCHED to limit large sips and Impulsive drinking). No overt clinical s/s of aspiration noted: no decline in vocal quality during few phonations, no cough, and no decline in respiratory status during/post trials; except a cough 1x during a larger sip less monitored(initially). O2 sats 98% when checked. Oral phase was adequate for bolus management and oral clearing of the boluses given. No solids assessed this session d/t Cognitive decline. OM Exam was cursory but appeared [facility] w/ No unilateral weakness noted during oral clearing and sucking on straw.  Confusion of OM tasks and oral care noted. Hand over hand guidance, verbal cues, and visual cues were helpful to initiate and follow through w/ po  task.         Discussed w/ Wife and pt the recommendation for a Dysphagia level 1 diet(Pureed foods) for ease of overall intake; thin liquids. Talked about reducing risks for aspiration by following general aspiration precautions to include Small sips Slowly, PINCH straw sips- small amounts. Must Sit Fully Upright w/ all oral intake. Recommended reducing distractions including Talking at meals and clearing mouth fully b/t Small bites/sips. Also discussed food consistencies and options, preparation, and use of condiments to soften foods also. Handouts given on aspiration precautions and a Dysphagia Drink Cup as support  for pt/Family. Pills CRUSHED in PUREE for ease/safety of swallow.   Further time during this session was spent addressing the impact of Dementia on pt's overall functioning, including the eating/drinking and communication. Highlighted the importance of establishing as much structure/routine during the meal; simple directions and less distractions during the tasks. Encouraged strategies to help him focus and calm- soft voice and pausing in tasks to give him Time to orient/calm.  Encouraged use of drink supplements and Magic Cup supplement in his diet when not wanting to eat meals; offer Water  often.  Answered Family's questions, offered support. Family was appreciative.    Recommend f/u by ST services acutely and at next venue of care when pt is demonstrating readiness to upgrade in his oral diet, and for further support re: pt's Dementia/behaviors- progression of disease process. Encouraged f/u by Palliative Care services for support and guidance re: GOC/POC in setting of pt's Comorbidities. Dietician also.  Precautions posted in chart, room. Family agreed. MD updated, agreed. SLP Visit Diagnosis: Dysphagia, oropharyngeal phase (R13.12) (in setting of significant Cognitive decline/Baseline Dementia; deconditioning/illness/hospitalization; need for feeding assistance)    Aspiration Risk  Mild aspiration risk;Moderate aspiration risk;Risk for inadequate nutrition/hydration  Diet Recommendation   Thin;Dysphagia 1 (puree) (gravies added to moisten and flavor) = Dysphagia level 1(puree) w/ thin liquids via Pinched Straw to limit bolus size and frequency. FULL feeding assistance and Supervision. Aspiration precautions. NGT support. Dysphagia level 1 diet(Pureed foods) for ease of overall intake; thin liquids. Talked about reducing risks for aspiration by following general aspiration precautions to include Small sips Slowly, PINCH straw sips- small amounts. Must Sit Fully Upright w/ all oral intake.  Recommended reducing distractions including Talking at meals and clearing mouth fully b/t Small bites/sips.  Medication Administration: Crushed with puree    Other  Recommendations Recommended Consults:  (Dietician f/u; Palliative Care f/u) Oral Care Recommendations: Oral care BID;Oral care before and after PO;Staff/trained caregiver to provide oral care     Assistance Recommended at Discharge  FULL   Functional Status Assessment Patient has had a recent decline in their functional status and/or demonstrates limited ability to make significant improvements in function in a reasonable and predictable amount of time  Frequency and Duration min 2x/week  2 weeks       Prognosis Prognosis for improved oropharyngeal function: Guarded Barriers to Reach Goals: Cognitive deficits;Language deficits;Time post onset;Severity of deficits;Behavior;Medication Barriers/Prognosis Comment: significant Cognitive decline/Baseline Dementia; deconditioning/illness/hospitalization; need for feeding assistance      Swallow Study   General Date of Onset: 10/24/24 HPI: Pt is a 78 year old male admitted with acute blood loss anemia secondary to upper GI bleed status post coil embolization of the gastroepiploic artery on 11/8 with EGD twice not show any underlying lesion or source of continued bleed. Pt intubated for hemorraghic shock then extubated to [place] on 10/27/2024.   PMH includes:  Dementia, acute metabolic encephalopathy, HFpEF, HTN, HLD, acute hypoxic respiratory failure, pulmonary edema, acute blood loss anemia, hypernatremia, GERD, malnutrition.    CXR: Stable bilateral interstitial opacities, suspicious for pulmonary edema. Type of Study: Bedside Swallow Evaluation Previous Swallow Assessment: none Diet Prior to this Study: NPO;Cortrak/Small bore NG tube Temperature Spikes Noted: No (wbc 10.9) Respiratory Status: Nasal cannula (4L) History of Recent Intubation: No Behavior/Cognition:  Alert;Cooperative;Pleasant mood;Confused;Distractible;Requires cueing;Doesn't follow directions Oral Cavity Assessment: Dry;Dried secretions (on tongue) Oral Care Completed by SLP: Yes (attempted) Oral Cavity - Dentition: Adequate natural dentition (viewable) Vision:  (n/a- confusion) Self-Feeding Abilities: Total assist (confusion) Patient Positioning: Upright in bed (full assist) Baseline Vocal Quality: Low vocal intensity (mumbled responses) Volitional Cough: Cognitively unable to elicit Volitional Swallow: Unable to elicit    Oral/Motor/Sensory Function Overall Oral Motor/Sensory Function:  (no unilateral weakness note- wfl)   Ice Chips Ice chips: Not tested   Thin Liquid Thin Liquid: Impaired Presentation: Straw (pinched; 12+ trials) Oral Phase Impairments: Poor awareness of bolus Oral Phase Functional Implications:  (impulsive sucking) Pharyngeal  Phase Impairments: Cough - Immediate (1x w/ multiple, larger sips) Other Comments: pinched straw more consistently    Nectar Thick Nectar Thick Liquid: Not tested   Honey Thick Honey Thick Liquid: Not tested   Puree Puree: Within functional limits (grossly) Presentation: Spoon (fed; 10 trials)   Solid     Solid: Not tested         [name], MS, CCC-SLP Speech Language Pathologist Rehab Services; [facility] [phone] (ascom) Jasemine Nawaz 11/02/2024,5:05 PM

## 2024-11-02 NOTE — Progress Notes (Addendum)
 Physical Therapy Treatment Patient Details Name: Albert [name] MRN: [id number] DOB: [birth year] Today's Date: 11/02/2024   History of Present Illness 78 year old male admitted with acute blood loss anemia secondary to upper GI bleed status post coil embolization of the gastroepiploic artery on 11/8 with EGD twice not show any underlying lesion or source of continued bleed. Pt intubated for hemorraghic shock then extubated to [place] on 10/27/2024. MD dx include: acute metabolic encephalopathy complicated by dementia, HFpEF, HTN, HLD, acute hypoxic respiratory failure, pulmonary edema, acute blood loss anemia, hypernatremia.    PT Comments  Pt in bed.  Awake, wife in for session.  Co-tx with OT for improved outcomes and safety.  He is able to transition to sitting with mod a x 2.  Once sitting EOB he does require constant +1 for safety in sitting mostly due to cognition and safety but he is generally able to sit unsupported but does lean forward on knees for support.  He does stand x 3 with mod a x 2 at bedside.  He is given walker for support but does not use it and often trying to fix gowns or pick at sacral patch.  Did not use RW at baseline per wife.  Gait is deferred for general safety.  While standing he is inc loose BM.  He fatigued and does eventually need to return to supine for continued care.  Some SOB noted but unable to get reading on finger pulse ox and after a short time returns to a more restful state.  Overall tolerates session better than anticipated.  Pt in bed, mits on and sitter in room upon completion of session.   If plan is discharge home, recommend the following: Two people to help with walking and/or transfers;Two people to help with bathing/dressing/bathroom;Assistance with cooking/housework;Direct supervision/assist for medications management;Direct supervision/assist for financial management;Assist for transportation;Supervision due to cognitive status   Can travel by private  vehicle        Equipment Recommendations       Recommendations for Other Services       Precautions / Restrictions Precautions Precautions: Fall Recall of Precautions/Restrictions: Impaired Restrictions Weight Bearing Restrictions Per Provider Order: No     Mobility  Bed Mobility Overal bed mobility: Needs Assistance Bed Mobility: Supine to Sit, Sit to Supine     Supine to sit: Mod assist, +2 for physical assistance Sit to supine: Min assist, +2 for physical assistance   General bed mobility comments: limited by cognition Patient Response: Restless, Cooperative  Transfers Overall transfer level: Needs assistance Equipment used: 2 person hand held assist Transfers: Sit to/from Stand Sit to Stand: Mod assist, +2 physical assistance           General transfer comment: stood x 3 at EOB during session.  Overall does well but fatigue is noted    Ambulation/Gait               General Gait Details: Deferred for safety   Stairs             Wheelchair Mobility     Tilt Bed Tilt Bed Patient Response: Restless, Cooperative  Modified Rankin (Stroke Patients Only)       Balance Overall balance assessment: Needs assistance Sitting-balance support: Feet supported, No upper extremity supported Sitting balance-Leahy Scale: Poor Sitting balance - Comments: +1 hands on for safety due to impulsivity, balance and cognition   Standing balance support: Bilateral upper extremity supported Standing balance-Leahy Scale: Poor  Communication Communication Communication: Impaired Factors Affecting Communication: Difficulty expressing self;Reduced clarity of speech  Cognition Arousal: Alert Behavior During Therapy: Restless, Agitated   PT - Cognitive impairments: History of cognitive impairments, No family/caregiver present to determine baseline                         Following commands:  Impaired Following commands impaired: Follows one step commands inconsistently    Cueing Cueing Techniques: Verbal cues, Tactile cues  Exercises Other Exercises Other Exercises: bed and linen change along with bathing.    General Comments        Pertinent Vitals/Pain Pain Assessment Pain Assessment: Faces Faces Pain Scale: Hurts even more Pain Location: mostly with pericare in standing and in bed due to inc BM with mobility Pain Descriptors / Indicators: Guarding, Sore Pain Intervention(s): Limited activity within patient's tolerance, Monitored during session    Home Living                          Prior Function            PT Goals (current goals can now be found in the care plan section) Progress towards PT goals: Progressing toward goals    Frequency    Min 1X/week      PT Plan      Co-evaluation PT/OT/SLP Co-Evaluation/Treatment: Yes Reason for Co-Treatment: Complexity of the patient's impairments (multi-system involvement);Necessary to address cognition/behavior during functional activity;For patient/therapist safety PT goals addressed during session: Mobility/safety with mobility OT goals addressed during session: ADL's and self-care      AM-PAC PT 6 Clicks Mobility   Outcome Measure  Help needed turning from your back to your side while in a flat bed without using bedrails?: A Lot Help needed moving from lying on your back to sitting on the side of a flat bed without using bedrails?: A Lot Help needed moving to and from a bed to a chair (including a wheelchair)?: Total Help needed standing up from a chair using your arms (e.g., wheelchair or bedside chair)?: A Lot Help needed to walk in hospital room?: Total Help needed climbing 3-5 steps with a railing? : Total 6 Click Score: 9    End of Session   Activity Tolerance: Patient limited by fatigue Patient left: in bed;with bed alarm set;with call bell/phone within reach;with nursing/sitter  in room Nurse Communication: Mobility status PT Visit Diagnosis: Unsteadiness on feet (R26.81);Muscle weakness (generalized) (M62.81);Difficulty in walking, not elsewhere classified (R26.2)     Time: 8955-8889 PT Time Calculation (min) (ACUTE ONLY): 26 min  Charges:    $Therapeutic Activity: 8-22 mins PT General Charges $$ ACUTE PT VISIT: 1 Visit                    Lauraine Gills, PTA 11/02/24, 11:39 AM

## 2024-11-02 NOTE — Progress Notes (Signed)
 PROGRESS NOTE    Albert [name] Albert [name]  FMW:[id number] DOB: Aug 21, 1946 DOA: 10/24/2024 PCP: Lenon [name] ORN, MD  Chief Complaint  Patient presents with   [facility] Course:  Sharolyn [name] Albert [name] is a 78 year old male with extensive past medical history including AAA with recent endovascular stent grafting 10/21/2024, dementia, Barrett's esophagus, chronic pain syndrome, history of CVA, who presented on this admission with hematemesis.  Patient has recently been on antiplatelet therapy as well as NSAIDs.  He underwent CTA which revealed active extravasation of the gastric lumen.  Vascular surgery evaluated the patient and did not feel that the bleeding was related to prior surgery.  GI was consulted and patient underwent EGD which revealed large clotted blood in the stomach and patient was unable to finish the procedure.  He was then transferred to the ICU.  Interventional radiology was then consulted and patient underwent embolization of the mid segment of the gastroepiploic artery along the greater curvature of the stomach.  Repeat EGD showed no evidence of underlying lesion or continued bleeding.  His ICU stay was then further complicated by significant agitated delirium, acute hypoxic respiratory failure due to pulmonary edema in setting of large volume resuscitation, and necessitated NG tube feeds.  He was transferred to the [facility] service on 11/15.  Before transfer he spiked a new fever and there was concern for aspiration pneumonia, so he was started empirically on Zosyn.  Subjective: No acute events overnight.  Patient's wife is at bedside during the time my evaluation today.  I spoke with his son on the phone. Patient is disoriented and actively trying to get out of bed.  His wife reports overnight he was tugging at his mittens.  Objective: Vitals:   11/02/24 0500 11/02/24 0820 11/02/24 0948 11/02/24 1504  BP:  132/66  (!) 143/74  Pulse:      Resp:  20  (!) 21  Temp:   (P) 97.7 F  (36.5 C) 99.3 F (37.4 C)  TempSrc:   (P) Oral Oral  SpO2:  100%  100%  Weight: 86.5 kg     Height:        Intake/Output Summary (Last 24 hours) at 11/02/2024 1551 Last data filed at 11/02/2024 1531 Gross per 24 hour  Intake 3318.44 ml  Output 1450 ml  Net 1868.44 ml   Filed Weights   10/31/24 0500 11/01/24 0402 11/02/24 0500  Weight: 85.2 kg 86.2 kg 86.5 kg    Examination: General exam: Appears calm and comfortable, NAD Respiratory system: No work of breathing, nasal cannula in place Cardiovascular system: S1 & S2 heard, RRR.  Gastrointestinal system: Abdomen is nondistended, soft and nontender.  NG tube in place. Neuro: Awake and alert, agitated  Assessment & Plan:  Principal Problem:   GI bleeding Active Problems:   Severe Alzheimer's dementia with agitation (HCC)   GERD (gastroesophageal reflux disease)   H/O stroke without residual deficits   Hypercholesterolemia   AAA (abdominal aortic aneurysm) without rupture   Acute upper GI bleed   Upper GI bleed   Acute respiratory failure with hypoxia (HCC)   Toxic metabolic encephalopathy   Malnutrition of moderate degree    Acute upper GI bleed Acute blood loss anemia - Status post coil embolization of gastroepiploic artery on 11/8 with IR - EGD x 2 with no underlying lesion found or continued bleeding noted - Some dark stool reported last night, may be old blood, hemoglobin still stable - Continue with IV PPI twice  daily --General Surgery evaluated patient while still in ICU and felt that subtotal gastrectomy would be futile given current critical illness superimposed on chronic comorbidities.  Family also elected not to pursue surgery - Continue trending CBC  Acute metabolic encephalopathy Dementia - Worsened by acute ICU delirium - Monitor mental status closely. - Hoping for improvement now that he has been transferred out of the unit - Continue with infection and electrolyte treatment - Per family:  At  baseline he is ambulatory and has quality of life at home.  He is not oriented to year and date at baseline. - Continue frequent reorientation - Delirium precautions - Resume home meds Seroquel  and Aricept  - Currently also receiving Seroquel  in the morning for acute agitation.  Will continue at this current dose and titrate as needed - Is on clonidine taper from ICU.  Continue to completion  Acute hypoxic respiratory failure Pulmonary edema due to large volume resuscitation - Extubated 11/11 - Continue supplemental O2, wean as tolerated - DNR/DNI - Status post diuresis - Hypoxia improved - Continue with bronchodilators and pulmonary toileting  Aspiration pneumonia - Initiated on Zosyn 11/14 - WBC downtrending now - Continue to monitor fever curve  Dysphagia - Currently with NG tube in place.  Discussed extensively with family that this is not a long-term solution - Speech therapy consulted, appreciate recommendations - Concern of aspiration and currently treating for aspiration pneumonia as above - Continue aspiration precautions - Monitor tube feeds closely.  Failure with preserved EF, acute exacerbation - Echocardiogram July 2025: EF 65 to 70%, grade 1 diastolic dysfunction - Acutely volume overloaded this admission secondary to large volume resuscitation required due to gastrointestinal hemorrhage - Adequately diuresed now - Continue to monitor volume status closely, additional diuresis as needed  Hypertension - Monitor blood pressures closely.  Resume home meds as able  Hyperlipidemia - Continue statin  Elevated troponin - Thought to be secondary to demand ischemia.  Troponin peaked at 148 - Plan to be on aspirin  and Plavix  at home, holding given GI bleed  AKI, resolved Hyponatremia Hypokalemia - Continue to monitor I's and O's - Follow CMP - Avoid nephrotoxic medications as able  AAA, status post stent grafting 11/5 - CV surgery reports current presentation is  likely unrelated - To be on aspirin  and Plavix , holding both in light of GI bleed as above    DVT prophylaxis: SCDs only   Code Status: Limited: Do not attempt resuscitation (DNR) -DNR-LIMITED -Do Not Intubate/DNI  Disposition: Pending significant clinical resolution   Consultants:  Treatment Team:  Consulting Physician: Marea Selinda RAMAN, MD Consulting Physician: Leesa Kast, DO  Procedures:    Antimicrobials:  Anti-infectives (From admission, onward)    Start     Dose/Rate Route Frequency Ordered Stop   10/30/24 1400  piperacillin-tazobactam (ZOSYN) IVPB 3.375 g        3.375 g 12.5 mL/hr over 240 Minutes Intravenous Every 8 hours 10/30/24 1119         Data Reviewed: I have personally reviewed following labs and imaging studies CBC: Recent Labs  Lab 10/28/24 1115 10/29/24 0412 10/29/24 1618 10/30/24 0429 10/31/24 0309 11/01/24 0315 11/02/24 0512  WBC 12.5* 13.5*  --  12.7* 15.2* 13.5* 10.9*  NEUTROABS 10.9*  --   --   --   --  10.4* 8.2*  HGB 7.8* 7.7* 8.7* 8.2* 9.2* 8.4* 8.0*  HCT 24.0* 24.0* 27.5* 25.6* 28.6* 27.3* 26.0*  MCV 89.9 89.2  --  90.5 88.8 91.9 91.5  PLT 188 193  --  232 255 285 348   Basic Metabolic Panel: Recent Labs  Lab 10/28/24 0421 10/29/24 0412 10/29/24 1253 10/29/24 1618 10/30/24 0429 10/31/24 0309 11/01/24 0315 11/02/24 0512  NA 153* 156* 156*  --  151* 148* 152* 145  K 3.2* 3.9  --   --  3.4* 3.4* 3.3* 3.6  CL 115* 121*  --   --  116* 110 111 110  CO2 25 27  --   --  24 26 27 26   GLUCOSE 157* 140*  --   --  167* 147* 111* 140*  BUN 54* 49*  --   --  36* 39* 39* 31*  CREATININE 1.36* 1.12  --   --  1.08 1.23 1.33* 1.11  CALCIUM  8.2* 8.3*  --   --  7.8* 8.0* 7.8* 7.7*  MG 2.8* 2.8*  --   --  2.4  --  2.7* 2.7*  PHOS 3.0 1.5*  --  2.4* 3.1 3.0 4.1 3.4   GFR: Estimated Creatinine Clearance: 63 mL/min (by C-G formula based on SCr of 1.11 mg/dL). Liver Function Tests: Recent Labs  Lab 10/29/24 0412 10/30/24 0429  10/31/24 0309 11/01/24 0315 11/02/24 0512  AST  --   --   --  55* 98*  ALT  --   --   --  77* 106*  ALKPHOS  --   --   --  126 120  BILITOT  --   --   --  0.4 0.3  PROT  --   --   --  5.4* 5.2*  ALBUMIN 2.9* 2.7* 2.9* 2.8* 2.7*   CBG: Recent Labs  Lab 11/01/24 0308 11/01/24 0756 11/01/24 1109 11/01/24 1215 11/01/24 1626  GLUCAP 131* 149* 137* 139* 124*    Recent Results (from the past 240 hours)  MRSA Next Gen by PCR, Nasal     Status: None   Collection Time: 10/24/24  1:10 PM   Specimen: Nasal Mucosa; Nasal Swab  Result Value Ref Range Status   MRSA by PCR Next Gen NOT DETECTED NOT DETECTED Final    Comment: (NOTE) The GeneXpert MRSA Assay (FDA approved for NASAL specimens only), is one component of a comprehensive MRSA colonization surveillance program. It is not intended to diagnose MRSA infection nor to guide or monitor treatment for MRSA infections. Test performance is not FDA approved in patients less than 41 years old. Performed at [facility], [address] [place], [place] [address]   MRSA Next Gen by PCR, Nasal     Status: None   Collection Time: 10/30/24 11:33 AM   Specimen: Nasal Mucosa; Nasal Swab  Result Value Ref Range Status   MRSA by PCR Next Gen NOT DETECTED NOT DETECTED Final    Comment: (NOTE) The GeneXpert MRSA Assay (FDA approved for NASAL specimens only), is one component of a comprehensive MRSA colonization surveillance program. It is not intended to diagnose MRSA infection nor to guide or monitor treatment for MRSA infections. Test performance is not FDA approved in patients less than 3 years old. Performed at [facility], [address], [place], [place] [address]      Radiology Studies: No results found.   Scheduled Meds:  vitamin C  500 mg Per Tube BID   Chlorhexidine  Gluconate Cloth  6 each Topical Daily   donepezil   10 mg Per Tube QHS   feeding supplement (PROSource TF20)  60 mL Per Tube Daily   free  water   200 mL Per Tube Q2H   lidocaine   1 patch Transdermal Q24H   pantoprazole (PROTONIX) IV  40 mg Intravenous Q12H   QUEtiapine   25 mg Per Tube Q1200   QUEtiapine   50 mg Per Tube QHS   tamsulosin  0.4 mg Oral QPC supper   thiamine  100 mg Per Tube Daily   Continuous Infusions:  feeding supplement (OSMOLITE 1.5 CAL) 1,000 mL (11/02/24 0644)   piperacillin-tazobactam (ZOSYN)  IV 3.375 g (11/02/24 1529)     LOS: 9 days  MDM: Patient is high risk for one or more organ failure.  They necessitate ongoing hospitalization for continued IV therapies and subsequent lab monitoring. Total time spent interpreting labs and vitals, reviewing the medical record, coordinating care amongst consultants and care team members, directly assessing and discussing care with the patient and/or family: 55 min  Raffael Bugarin, DO [facility]  To contact the attending physician between 7A-7P please use Epic Chat. To contact the covering physician during after hours 7P-7A, please review Amion.  11/02/2024, 3:51 PM   *This document has been created with the assistance of dictation software. Please excuse typographical errors. *

## 2024-11-02 NOTE — Progress Notes (Signed)
 Occupational Therapy Treatment Patient Details Name: Albert [name] MRN: [id number] DOB: 17-May-1946 Today's Date: 11/02/2024   History of present illness 78 year old male admitted with acute blood loss anemia secondary to upper GI bleed status post coil embolization of the gastroepiploic artery on 11/8 with EGD twice not show any underlying lesion or source of continued bleed. Pt intubated for hemorraghic shock then extubated to [place] on 10/27/2024. MD dx include: acute metabolic encephalopathy complicated by dementia, HFpEF, HTN, HLD, acute hypoxic respiratory failure, pulmonary edema, acute blood loss anemia, hypernatremia.   OT comments  Mr Rhinehart was seen for OT treatment on this date. Upon arrival to room pt in bed with family at bedside, pt alert and agreeable to tx. Pt requires MOD A x2 exit bed, initiates sitting with cues. MOD A x2 + RW sit<>stand x3 trials, incontinent of stool and requires MAX A x2 pericare standing, +2 to prevent pt swatting at hands. Completed toileting and bed change rolling bed level. Pt making good progress toward goals, will continue to follow POC. Discharge recommendation remains appropriate.        If plan is discharge home, recommend the following:  Two people to help with walking and/or transfers;Two people to help with bathing/dressing/bathroom;Help with stairs or ramp for entrance;Supervision due to cognitive status   Equipment Recommendations  Other (comment) (defer)    Recommendations for Other Services      Precautions / Restrictions Precautions Precautions: Fall Recall of Precautions/Restrictions: Impaired Restrictions Weight Bearing Restrictions Per Provider Order: No       Mobility Bed Mobility Overal bed mobility: Needs Assistance Bed Mobility: Supine to Sit, Sit to Supine     Supine to sit: Mod assist, +2 for physical assistance Sit to supine: Mod assist, +2 for physical assistance   General bed mobility comments: pt initiates  sitting with cues    Transfers Overall transfer level: Needs assistance Equipment used: Rolling walker (2 wheels) Transfers: Sit to/from Stand Sit to Stand: Mod assist, +2 physical assistance                 Balance Overall balance assessment: Needs assistance Sitting-balance support: Feet supported, No upper extremity supported Sitting balance-Leahy Scale: Fair   Postural control: Posterior lean Standing balance support: Bilateral upper extremity supported Standing balance-Leahy Scale: Poor                             ADL either performed or assessed with clinical judgement   ADL Overall ADL's : Needs assistance/impaired                                       General ADL Comments: MAX A x2 pericare standing and bed level - requires +2 standing to prevent swatting at hands     Communication Communication Communication: Impaired Factors Affecting Communication: Difficulty expressing self;Reduced clarity of speech   Cognition Arousal: Alert Behavior During Therapy: Restless, Agitated Cognition: History of cognitive impairments, Cognition impaired, No family/caregiver present to determine baseline             OT - Cognition Comments: states name and recognizes spouse but unable to state her name. Follows initial commands decreases as pt fatgiues                 Following commands: Impaired Following commands impaired: Follows one step commands inconsistently  Pertinent Vitals/ Pain       Pain Assessment Pain Assessment: PAINAD Breathing: normal Negative Vocalization: none Facial Expression: sad, frightened, frown Body Language: tense, distressed pacing, fidgeting Consolability: distracted or reassured by voice/touch PAINAD Score: 3   Frequency  Min 2X/week        Progress Toward Goals  OT Goals(current goals can now be found in the care plan section)  Progress towards OT goals: Progressing  toward goals  Acute Rehab OT Goals OT Goal Formulation: Patient unable to participate in goal setting Time For Goal Achievement: 11/13/24 Potential to Achieve Goals: Fair ADL Goals Pt Will Perform Grooming: with min assist;standing Pt Will Perform Lower Body Dressing: with min assist;with caregiver independent in assisting;sit to/from stand Pt Will Transfer to Toilet: with min assist;ambulating;regular height toilet  Plan      Co-evaluation    PT/OT/SLP Co-Evaluation/Treatment: Yes Reason for Co-Treatment: Complexity of the patient's impairments (multi-system involvement);Necessary to address cognition/behavior during functional activity;For patient/therapist safety PT goals addressed during session: Mobility/safety with mobility OT goals addressed during session: ADL's and self-care      AM-PAC OT 6 Clicks Daily Activity     Outcome Measure   Help from another person eating meals?: Total Help from another person taking care of personal grooming?: A Lot Help from another person toileting, which includes using toliet, bedpan, or urinal?: A Lot Help from another person bathing (including washing, rinsing, drying)?: A Lot Help from another person to put on and taking off regular upper body clothing?: A Lot Help from another person to put on and taking off regular lower body clothing?: A Lot 6 Click Score: 11    End of Session    OT Visit Diagnosis: Other abnormalities of gait and mobility (R26.89);Muscle weakness (generalized) (M62.81)   Activity Tolerance Patient tolerated treatment well   Patient Left in bed;with call bell/phone within reach;with bed alarm set;with nursing/sitter in room   Nurse Communication Mobility status        Time: 8956-8888 OT Time Calculation (min): 28 min  Charges: OT General Charges $OT Visit: 1 Visit OT Treatments $Self Care/Home Management : 8-22 mins  Elston Slot, M.S. OTR/L  11/02/24, 3:16 PM  ascom [phone]

## 2024-11-02 NOTE — Plan of Care (Signed)
  Problem: Clinical Measurements: Goal: Ability to maintain clinical measurements within normal limits will improve Outcome: Progressing   Problem: Nutrition: Goal: Adequate nutrition will be maintained Outcome: Progressing   Problem: Elimination: Goal: Will not experience complications related to bowel motility Outcome: Progressing   Problem: Safety: Goal: Ability to remain free from injury will improve Outcome: Progressing   Problem: Pain Managment: Goal: General experience of comfort will improve and/or be controlled Outcome: Progressing

## 2024-11-03 DIAGNOSIS — K922 Gastrointestinal hemorrhage, unspecified: Secondary | ICD-10-CM | POA: Diagnosis not present

## 2024-11-03 DIAGNOSIS — Z7189 Other specified counseling: Secondary | ICD-10-CM

## 2024-11-03 DIAGNOSIS — I7143 Infrarenal abdominal aortic aneurysm, without rupture: Secondary | ICD-10-CM | POA: Diagnosis not present

## 2024-11-03 DIAGNOSIS — Z8673 Personal history of transient ischemic attack (TIA), and cerebral infarction without residual deficits: Secondary | ICD-10-CM | POA: Diagnosis not present

## 2024-11-03 DIAGNOSIS — E7800 Pure hypercholesterolemia, unspecified: Secondary | ICD-10-CM | POA: Diagnosis not present

## 2024-11-03 LAB — CBC WITH DIFFERENTIAL/PLATELET
Abs Immature Granulocytes: 0.08 K/uL — ABNORMAL HIGH (ref 0.00–0.07)
Abs Immature Granulocytes: 0.09 K/uL — ABNORMAL HIGH (ref 0.00–0.07)
Basophils Absolute: 0 K/uL (ref 0.0–0.1)
Basophils Absolute: 0 K/uL (ref 0.0–0.1)
Basophils Relative: 0 %
Basophils Relative: 0 %
Eosinophils Absolute: 0.3 K/uL (ref 0.0–0.5)
Eosinophils Absolute: 0.1 K/uL (ref 0.0–0.5)
Eosinophils Relative: 3 %
Eosinophils Relative: 1 %
HCT: 24.5 % — ABNORMAL LOW (ref 39.0–52.0)
HCT: 27.3 % — ABNORMAL LOW (ref 39.0–52.0)
Hemoglobin: 7.7 g/dL — ABNORMAL LOW (ref 13.0–17.0)
Hemoglobin: 8.4 g/dL — ABNORMAL LOW (ref 13.0–17.0)
Immature Granulocytes: 1 %
Immature Granulocytes: 1 %
Lymphocytes Relative: 14 %
Lymphocytes Relative: 9 %
Lymphs Abs: 1.4 K/uL (ref 0.7–4.0)
Lymphs Abs: 1.2 K/uL (ref 0.7–4.0)
MCH: 28.5 pg (ref 26.0–34.0)
MCH: 28.2 pg (ref 26.0–34.0)
MCHC: 31.4 g/dL (ref 30.0–36.0)
MCHC: 30.8 g/dL (ref 30.0–36.0)
MCV: 90.7 fL (ref 80.0–100.0)
MCV: 91.6 fL (ref 80.0–100.0)
Monocytes Absolute: 0.7 K/uL (ref 0.1–1.0)
Monocytes Absolute: 0.8 K/uL (ref 0.1–1.0)
Monocytes Relative: 7 %
Monocytes Relative: 6 %
Neutro Abs: 7.7 K/uL (ref 1.7–7.7)
Neutro Abs: 11.3 K/uL — ABNORMAL HIGH (ref 1.7–7.7)
Neutrophils Relative %: 75 %
Neutrophils Relative %: 83 %
Platelets: 384 K/uL (ref 150–400)
Platelets: 440 K/uL — ABNORMAL HIGH (ref 150–400)
RBC: 2.7 MIL/uL — ABNORMAL LOW (ref 4.22–5.81)
RBC: 2.98 MIL/uL — ABNORMAL LOW (ref 4.22–5.81)
RDW: 14.9 % (ref 11.5–15.5)
RDW: 14.8 % (ref 11.5–15.5)
WBC: 10.3 K/uL (ref 4.0–10.5)
WBC: 13.7 K/uL — ABNORMAL HIGH (ref 4.0–10.5)
nRBC: 0 % (ref 0.0–0.2)
nRBC: 0 % (ref 0.0–0.2)

## 2024-11-03 LAB — COMPREHENSIVE METABOLIC PANEL WITH GFR
ALT: 150 U/L — ABNORMAL HIGH (ref 0–44)
AST: 132 U/L — ABNORMAL HIGH (ref 15–41)
Albumin: 2.7 g/dL — ABNORMAL LOW (ref 3.5–5.0)
Alkaline Phosphatase: 134 U/L — ABNORMAL HIGH (ref 38–126)
Anion gap: 8 (ref 5–15)
BUN: 24 mg/dL — ABNORMAL HIGH (ref 8–23)
CO2: 26 mmol/L (ref 22–32)
Calcium: 7.8 mg/dL — ABNORMAL LOW (ref 8.9–10.3)
Chloride: 108 mmol/L (ref 98–111)
Creatinine, Ser: 1.07 mg/dL (ref 0.61–1.24)
GFR, Estimated: >60 mL/min (ref >60)
Glucose, Bld: 128 mg/dL — ABNORMAL HIGH (ref 70–99)
Potassium: 3.8 mmol/L (ref 3.5–5.1)
Sodium: 141 mmol/L (ref 135–145)
Total Bilirubin: 0.3 mg/dL (ref 0.0–1.2)
Total Protein: 5.3 g/dL — ABNORMAL LOW (ref 6.5–8.1)

## 2024-11-03 LAB — PHOSPHORUS: Phosphorus: 3.3 mg/dL (ref 2.5–4.6)

## 2024-11-03 LAB — MAGNESIUM: Magnesium: 2.6 mg/dL — ABNORMAL HIGH (ref 1.7–2.4)

## 2024-11-03 MED ORDER — VITAMIN C 500 MG PO TABS
500.0000 mg | ORAL_TABLET | Freq: Two times a day (BID) | ORAL | Status: DC
  Administered 2024-11-03 – 2024-11-10 (×14): 500 mg via ORAL
  Filled 2024-11-03 (×14): qty 1

## 2024-11-03 MED ORDER — ENSURE PLUS HIGH PROTEIN PO LIQD
237.0000 mL | Freq: Three times a day (TID) | ORAL | Status: DC
  Administered 2024-11-04 – 2024-11-10 (×16): 237 mL via ORAL

## 2024-11-03 MED ORDER — LOPERAMIDE HCL 2 MG PO CAPS
2.0000 mg | ORAL_CAPSULE | ORAL | Status: DC | PRN
  Administered 2024-11-03: 2 mg via ORAL
  Filled 2024-11-03: qty 1

## 2024-11-03 MED ORDER — ADULT MULTIVITAMIN W/MINERALS CH
1.0000 | ORAL_TABLET | Freq: Every day | ORAL | Status: DC
  Administered 2024-11-04 – 2024-11-10 (×7): 1 via ORAL
  Filled 2024-11-03 (×7): qty 1

## 2024-11-03 NOTE — Progress Notes (Signed)
 Dobhoff had pulled out about 17cm when the patient was being bathed. MD notified and said that the dobhoff could be taken out. Dobhoff removed

## 2024-11-03 NOTE — Consult Note (Cosign Needed Addendum)
 Consultation Note Date: 11/03/2024   Patient Name: Albert [name]  DOB: 05-17-1946  MRN: [id number]  Age / Sex: 78 y.o., male  PCP: Albert [name] ORN, MD Referring Physician: Leesa Kast, DO  Reason for Consultation: Establishing goals of care  HPI/Patient Profile: Albert [name] is a pleasant 78 y.o. male with medical history significant for chronic dementia, Barrett's esophagus, chronic pain, history of stroke, abdominal aortic aneurysm s/p recent endograft 10/21/2024 who presented to ED with hematemesis started this morning.  Patient is not able to provide meaningful history.  Patient's wife was at bedside and she stated that patient had syncopal episode and feeling weak this morning.  He also had dark blood vomiting.  Patient was taking NSAIDs and antiplatelets.  CTA performed with active extravasation of gastric lumen.   Clinical Assessment and Goals of Care: Notes and labs reviewed.  Hemoglobin continues to trend down.  Creatinine WNL.  Updates provided by nursing and staff.  Patient with frequent diarrhea, C. difficile pending.  In to see patient.  He is currently sitting in bedside chair with staff at bedside.  He appears restless trying to stand, but has weakness and difficulty in doing so.  No family at bedside.  Attempted to reach family by phone, but was unsuccessful.  Returned to bedside and primary team was talking with family outside of room.  Current discussion of care moving forward, and continuing to treat the treatable.  Discussion of further testing and potential workup.  Questions answered as able.  Time for outcomes.  Updated by attending.  DNR/DNI, continue to treat treatable.   SUMMARY OF RECOMMENDATIONS   PMT will follow      Primary Diagnoses: Present on Admission:  GI bleeding  GERD (gastroesophageal reflux disease)  Hypercholesterolemia  AAA (abdominal aortic  aneurysm) without rupture  Acute upper GI bleed  Severe Alzheimer's dementia with agitation (HCC)   I have reviewed the medical record, interviewed the patient and family, and examined the patient. The following aspects are pertinent.  Past Medical History:  Diagnosis Date   Anemia    Anxiety    a.) on BZO (alproazolam) PRN   Arthritis of both knees    Back pain    Barrett esophagus    Chronic back pain    Dementia (HCC)    arising in the senium and presenium   Displaced fracture of proximal phalanx of right lesser toe(s), initial encounter for closed fracture    Diverticulosis    GERD (gastroesophageal reflux disease)    OTC meds as needed   Headache    History of colon polyps    benign    History of kidney stones    History of migraine    Hyperlipidemia    Hypertension    Insomnia    Joint pain    Nephrolithiasis    Pre-diabetes    Stroke (HCC)    without residual deficits   Social History   Socioeconomic History   Marital status: Married    Spouse  name: Albert [name]   Number of children: Not on file   Years of education: Not on file   Highest education level: Not on file  Occupational History   Not on file  Tobacco Use   Smoking status: Every Day    Current packs/day: 0.50    Average packs/day: 0.5 packs/day for 50.0 years (25.0 ttl pk-yrs)    Types: Cigarettes   Smokeless tobacco: Never   Tobacco comments:    Half a pack a day, per wife.  Vaping Use   Vaping status: Never Used  Substance and Sexual Activity   Alcohol use: No   Drug use: No   Sexual activity: Not Currently  Other Topics Concern   Not on file  Social History Narrative   Not on file   Social Drivers of Health   Financial Resource Strain: Low Risk  (08/31/2024)   Received from [facility]   Overall Financial Resource Strain (CARDIA)    Difficulty of Paying Living Expenses: Not very hard  Food Insecurity: Patient Unable To Answer (10/24/2024)   Hunger Vital Sign     Worried About Running Out of Food in the Last Year: Patient unable to answer    Ran Out of Food in the Last Year: Patient unable to answer  Transportation Needs: Patient Unable To Answer (10/24/2024)   PRAPARE - Transportation    Lack of Transportation (Medical): Patient unable to answer    Lack of Transportation (Non-Medical): Patient unable to answer  Physical Activity: Not on file  Stress: Not on file  Social Connections: Unknown (10/21/2024)   Social Connection and Isolation Panel    Frequency of Communication with Friends and Family: Patient unable to answer    Frequency of Social Gatherings with Friends and Family: Patient unable to answer    Attends Religious Services: Patient unable to answer    Active Member of Clubs or Organizations: Patient unable to answer    Attends Banker Meetings: Patient unable to answer    Marital Status: Married   Family History  Problem Relation Age of Onset   Arthritis Mother    Esophageal cancer Father    Prostate cancer Neg Hx    Bladder Cancer Neg Hx    Kidney cancer Neg Hx    Scheduled Meds:  vitamin C  500 mg Per Tube BID   Chlorhexidine  Gluconate Cloth  6 each Topical Daily   donepezil   10 mg Per Tube QHS   lidocaine   1 patch Transdermal Q24H   pantoprazole (PROTONIX) IV  40 mg Intravenous Q12H   QUEtiapine   25 mg Per Tube Q1200   QUEtiapine   50 mg Per Tube QHS   tamsulosin  0.4 mg Oral QPC supper   Continuous Infusions:  piperacillin-tazobactam (ZOSYN)  IV 3.375 g (11/03/24 1317)   PRN Meds:.acetaminophen  **OR** acetaminophen , guaiFENesin, haloperidol  lactate, HYDROmorphone  (DILAUDID ) injection, ipratropium-albuterol, lip balm, ondansetron  **OR** ondansetron  (ZOFRAN ) IV, mouth rinse, polyethylene glycol Medications Prior to Admission:  Prior to Admission medications   Medication Sig Start Date End Date Taking? Authorizing Provider  acetaminophen  (TYLENOL ) 325 MG tablet Take 650 mg by mouth every 8 (eight) hours as  needed for moderate pain.   Yes [provider]  amLODipine  (NORVASC ) 10 MG tablet Take 10 mg by mouth daily. 08/27/18  Yes [provider]  aspirin  EC 81 MG tablet Take 81 mg by mouth daily.   Yes [provider]  clopidogrel  (PLAVIX ) 75 MG tablet 75 mg daily. 07/29/19  Yes  [provider]  cyanocobalamin  (VITAMIN B12) 1000 MCG tablet Take 1,000 mcg by mouth.  Take 1,000 mcg by mouth once daily   Yes [provider]  diphenhydramine-acetaminophen  (TYLENOL  PM) 25-500 MG TABS tablet Take 1 tablet by mouth at bedtime.   Yes [provider]  donepezil  (ARICEPT ) 10 MG tablet Take 10 mg by mouth.  TAKE 1 TABLET(10 MG) BY MOUTH AT BEDTIME 04/23/24  Yes [provider]  DULoxetine  (CYMBALTA ) 60 MG capsule Take 60 mg by mouth daily. 02/25/19 10/25/24 Yes [provider]  ferrous sulfate  325 (65 FE) MG tablet Take 325 mg by mouth.  Take 325 mg by mouth daily with breakfast   Yes [provider]  ibuprofen  (ADVIL ) 400 MG tablet Take 400 mg by mouth every 6 (six) hours as needed.   Yes [provider]  Multiple Vitamin (MULTIVITAMIN WITH MINERALS) TABS tablet Take 1 tablet by mouth daily.   Yes [provider]  naproxen sodium (ALEVE) 220 MG tablet Take 220 mg by mouth daily as needed.   Yes [provider]  QUEtiapine  (SEROQUEL ) 25 MG tablet Take 25 mg by mouth at bedtime. 06/15/24  Yes [provider]  rosuvastatin  (CRESTOR ) 20 MG tablet Take 20 mg by mouth daily. 07/29/19  Yes [provider]  ALPRAZolam  (XANAX ) 0.25 MG tablet Take 0.25 mg by mouth at bedtime as needed for anxiety. Patient not taking: Reported on 10/22/2024 05/01/19   [provider]  naphazoline-pheniramine (NAPHCON-A) 0.025-0.3 % ophthalmic solution Place 1 drop into both eyes 4 (four) times daily as needed for eye irritation. Patient not taking: Reported on 10/25/2024    [provider]  trolamine  salicylate (ASPERCREME) 10 % cream Apply 1 application topically as needed for muscle pain. Patient not taking: Reported on 10/25/2024    [provider]   Allergies  Allergen Reactions   Codeine Nausea And Vomiting   Review of Systems  Unable to perform ROS   Physical Exam Pulmonary:     Effort: Pulmonary effort is normal.  Neurological:     Mental Status: He is alert.     Vital Signs: BP (!) 154/66 (BP Location: Left Arm)   Pulse 100   Temp 99.5 F (37.5 C) (Axillary)   Resp 17   Ht 6' 1 (1.854 m)   Wt 90.5 kg   SpO2 93%   BMI 26.32 kg/m  Pain Scale: Faces   Pain Score: Asleep   SpO2: SpO2: 93 % O2 Device:SpO2: 93 % O2 Flow Rate: .O2 Flow Rate (L/min): 4 L/min  IO: Intake/output summary:  Intake/Output Summary (Last 24 hours) at 11/03/2024 1553 Last data filed at 11/03/2024 0935 Gross per 24 hour  Intake 2160 ml  Output 1650 ml  Net 510 ml    LBM: Last BM Date : 11/03/24 Baseline Weight: Weight: 90 kg Most recent weight: Weight: 90.5 kg       Signed by: Camelia Lewis, NP   Please contact Palliative Medicine Team phone at [phone] for questions and concerns.  For individual provider: See Tracey

## 2024-11-03 NOTE — Progress Notes (Signed)
 Nutrition Follow Up Note   DOCUMENTATION CODES:   Non-severe (moderate) malnutrition in context of social or environmental circumstances  INTERVENTION:   Ensure Plus High Protein po TID, each supplement provides 350 kcal and 20 grams of protein  Magic cup TID with meals, each supplement provides 290 kcal and 9 grams of protein  MVI po daily   Vitamin C 500mg  po BID   Daily weights   NUTRITION DIAGNOSIS:   Moderate Malnutrition related to social / environmental circumstances as evidenced by moderate fat depletion, moderate muscle depletion, severe muscle depletion. -ongoing   GOAL:   Patient will meet greater than or equal to 90% of their needs -previously met with tube feeds   MONITOR:   PO intake, Supplement acceptance, Labs, Weight trends, I & O's, Skin  ASSESSMENT:   78 y/o male with h/o GERD, CVA, HLD, anxiety, dementia, diverticulosis, kidney stones, Barrett's esophagus, HTN and abdominal aortic aneurysm s/p recent repair 11/5 who is admitted with  hematemesis s/p coil embolization of of gastroepiploic artery on 11/8 and AKI.  Pt with ongoing AMS and agitation. Pt seen by SLP 11/17 and was placed on a pureed diet. Pt with poor appetite and oral intake. NGT removed by patient today. RD will add supplements and vitamins to help pt meet his estimated needs. PEG tube is not recommended as pt with advanced dementia and feeding tube will not likely improve pt's outcomes or quality of life. Pt with ongoing melena. Per chart, pt appears to be at his UBW currently. Palliative care is following; plan is for full scope of care at this time.     Medications reviewed and include: vitamin C, protonix, zosyn  Labs reviewed: K 3.8 wnl, BUN 24(H), P 3.3 wnl, Mg 2.6(H) Hgb 7.7(L), Hct 24.5(L)  UOP-   Diet Order:   Diet Order             DIET - DYS 1 Room service appropriate? No; Fluid consistency: Thin  Diet effective now                  EDUCATION NEEDS:   No  education needs have been identified at this time  Skin:  Skin Assessment: Reviewed RN Assessment (ecchymosis, Stage II sacrum/buttocks)  Last BM:  11/18- type 7  Height:   Ht Readings from Last 1 Encounters:  10/24/24 6' 1 (1.854 m)    Weight:   Wt Readings from Last 1 Encounters:  11/03/24 90.5 kg    Ideal Body Weight:  83.6 kg  BMI:  Body mass index is 26.32 kg/m.  Estimated Nutritional Needs:   Kcal:  2200-2500kcal/day  Protein:  110-125g/day  Fluid:  2.2-2.5L/day  Augustin Shams MS, RD, LDN If unable to be reached, please send secure chat to RD inpatient available from 8:00a-4:00p daily

## 2024-11-03 NOTE — Plan of Care (Signed)

## 2024-11-03 NOTE — Progress Notes (Signed)
 PROGRESS NOTE    Albert [name]  FMW:[id number] DOB: Feb 16, 1946 DOA: 10/24/2024 PCP: Lenon [name] ORN, MD  Chief Complaint  Patient presents with   [facility]:  Albert SHAUNNA Skip Litke. is a 78 year old male with extensive past medical history including AAA with recent endovascular stent grafting 10/21/2024, dementia, Barrett's esophagus, chronic pain syndrome, history of CVA, who presented on this admission with hematemesis.  Patient has recently been on antiplatelet therapy as well as NSAIDs.  He underwent CTA which revealed active extravasation of the gastric lumen.  Vascular surgery evaluated the patient and did not feel that the bleeding was related to prior surgery.  GI was consulted and patient underwent EGD which revealed large clotted blood in the stomach and patient was unable to finish the procedure.  He was then transferred to the ICU.  Interventional radiology was then consulted and patient underwent embolization of the mid segment of the gastroepiploic artery along the greater curvature of the stomach.  Repeat EGD showed no evidence of underlying lesion or continued bleeding.  His ICU stay was then further complicated by significant agitated delirium, acute hypoxic respiratory failure due to pulmonary edema in setting of large volume resuscitation, and necessitated NG tube feeds.  He was transferred to the [facility] service on 11/15.  Before transfer he spiked a new fever and there was concern for aspiration pneumonia, so he was started empirically on Zosyn.  He remains PEG tube dependent.  SLP evaluation 11/17 allowed patient to get started on dysphagia diet but he requires significant prompting and is unlikely to sustain nutrition via oral sources alone. 11/18 patient began having significant bowel movements with some melena.  Globin downtrending.  GOC conversation with the patient's wife and sons.  At this time they like to pursue all aggressive care.  They are not interested in  hospice.  Palliative has been consulted  Subjective: Patient remains very agitated, tried to get out of bed, pulling at his mittens.  NG tube was removed this morning.  Patient has had multiple bowel movements throughout the day today.  Bedside RN reports many dark stools, no bright red blood.  Objective: Vitals:   11/02/24 2101 11/03/24 0439 11/03/24 0500 11/03/24 0805  BP: (!) 153/70 (!) 148/74  139/81  Pulse:  90  89  Resp: (!) 24 20  16   Temp: 99.4 F (37.4 C) 99.2 F (37.3 C)  99.5 F (37.5 C)  TempSrc:    Oral  SpO2: 95% 97%  98%  Weight:   90.5 kg   Height:        Intake/Output Summary (Last 24 hours) at 11/03/2024 0829 Last data filed at 11/03/2024 0734 Gross per 24 hour  Intake 3228.08 ml  Output 1250 ml  Net 1978.08 ml   Filed Weights   11/01/24 0402 11/02/24 0500 11/03/24 0500  Weight: 86.2 kg 86.5 kg 90.5 kg    Examination: General exam: Appears calm and comfortable, NAD Respiratory system: No work of breathing, nasal cannula in place Cardiovascular system: S1 & S2 heard, RRR.  Gastrointestinal system: Abdomen is nondistended, soft and nontender.  NG tube in place. Neuro: Awake and alert, agitated  Assessment & Plan:  Principal Problem:   GI bleeding Active Problems:   Severe Alzheimer's dementia with agitation (HCC)   GERD (gastroesophageal reflux disease)   H/O stroke without residual deficits   Hypercholesterolemia   AAA (abdominal aortic aneurysm) without rupture   Acute upper GI bleed   Upper GI  bleed   Acute respiratory failure with hypoxia (HCC)   Toxic metabolic encephalopathy   Malnutrition of moderate degree    Acute upper GI bleed Acute blood loss anemia Barrett's esophagus - Status post coil embolization of gastroepiploic artery on 11/8 with IR - EGD x 2 with no underlying lesion found or continued bleeding noted - Over 12 bowel movements today, some melena, concerned that patient is bleeding again.  Frequent bowel movements may be  a laxative effect from blood.  Have discussed with ID who sees no indication for C. difficile at this time - Repeat CBC, if hemoglobin has downtrended under 7 family has consented to an additional blood transfusion - Consulted with gastroenterology, not a candidate for repeat EGD at this time.  If hemoglobin is downtrending we will repeat CT angio and call vascular surgery for possible embolization if positive. - Continue with IV PPI twice daily --General Surgery evaluated patient while still in ICU and felt that subtotal gastrectomy would be futile given current critical illness superimposed on chronic comorbidities.  Family also elected not to pursue surgery - Trend CBC in a.m.  Acute metabolic encephalopathy Dementia History of CVA - Acute worsening secondary to ICU delirium as well as multiple comorbidities and new medications. - Has had minimal mental recovery since transferring out of the ICU.  He may be nearing his new baseline - Continue with infection and electrolyte treatment - Per family:  At baseline he is ambulatory and has quality of life at home.  He is not oriented to year and date at baseline. - Continue frequent reorientation - Delirium precautions - Resume home meds Seroquel  and Aricept  - Currently also receiving Seroquel  in the morning for acute agitation.  Will continue at this current dose and titrate as needed - Status post clonidine taper from the ICU  Acute hypoxic respiratory failure Pulmonary edema due to large volume resuscitation - Extubated 11/11 - Continue supplemental O2, wean as tolerated - DNR/DNI - Status post diuresis - Hypoxia improved - Continue with bronchodilators and pulmonary toileting  Aspiration pneumonia - Zosyn 11/14 -> 11/21 - WBC downtrending now - Continue to monitor fever curve  Dysphagia - NG tube removed 11/18 - SLP working with patient.  Presently requires significant prompting and one-to-one feeding. - At this time I do fear  that patient has gradually worsening dementia and may not return to his prior level of function.  He continues to be very high risk of aspiration, given his current level of alertness I doubt he will be able to sustain significant nutrition on oral sources alone.  Family would like to continue working on this - He is not a PEG tube candidate given profound GI bleed as above. - Concern of aspiration and currently treating for aspiration pneumonia as above - Continue aspiration precautions - Monitor tube feeds closely.  Failure with preserved EF, acute exacerbation - Echocardiogram July 2025: EF 65 to 70%, grade 1 diastolic dysfunction - Acutely volume overloaded this admission secondary to large volume resuscitation required due to gastrointestinal hemorrhage - Adequately diuresed now - Continue to monitor volume status closely, additional diuresis as needed  Hypertension - Monitor blood pressures closely.  Resume home meds as able  Hyperlipidemia - Continue statin  Elevated troponin - Thought to be secondary to demand ischemia.  Troponin peaked at 148 - Meant to be on aspirin  and Plavix  at home, holding given GI bleed  AKI, resolved Hyponatremia Hypokalemia - Continue to monitor I's and O's - Follow CMP -  Avoid nephrotoxic medications as able  AAA, status post stent grafting 11/5 - CV surgery reports current presentation is likely unrelated - Meant to be on aspirin  and Plavix , holding both in light of GI bleed as above  Poor prognosis - Given patient's overall clinical picture including advanced dementia, recent CVA, recent AAA repair, GI bleed, he would likely have a very deconditioned baseline at time of discharge.  He has poor functional reserve and has been gradually declining outpatient for months prior to this admission.  His wife, his primary caregiver, endorses some concerns about her ability to take care of him at home.  I have had extensive discussions with the family  today discussing the overall picture.  At this time they like to continue all aggressive care.  Palliative care has also been consulted to further facilitate these discussions.   DVT prophylaxis: SCDs only   Code Status: Limited: Do not attempt resuscitation (DNR) -DNR-LIMITED -Do Not Intubate/DNI  Disposition: Pending significant clinical resolution, pending final goals of care discussion  Consultants:  Treatment Team:  Consulting Physician: Marea Selinda RAMAN, MD Consulting Physician: Leesa Kast, DO  Procedures:    Antimicrobials:  Anti-infectives (From admission, onward)    Start     Dose/Rate Route Frequency Ordered Stop   10/30/24 1400  piperacillin-tazobactam (ZOSYN) IVPB 3.375 g        3.375 g 12.5 mL/hr over 240 Minutes Intravenous Every 8 hours 10/30/24 1119         Data Reviewed: I have personally reviewed following labs and imaging studies CBC: Recent Labs  Lab 10/28/24 1115 10/29/24 0412 10/30/24 0429 10/31/24 0309 11/01/24 0315 11/02/24 0512 11/03/24 0533  WBC 12.5*   < > 12.7* 15.2* 13.5* 10.9* 10.3  NEUTROABS 10.9*  --   --   --  10.4* 8.2* 7.7  HGB 7.8*   < > 8.2* 9.2* 8.4* 8.0* 7.7*  HCT 24.0*   < > 25.6* 28.6* 27.3* 26.0* 24.5*  MCV 89.9   < > 90.5 88.8 91.9 91.5 90.7  PLT 188   < > 232 255 285 348 384   < > = values in this interval not displayed.   Basic Metabolic Panel: Recent Labs  Lab 10/29/24 0412 10/29/24 1253 10/30/24 0429 10/31/24 0309 11/01/24 0315 11/02/24 0512 11/03/24 0533  NA 156*   < > 151* 148* 152* 145 141  K 3.9  --  3.4* 3.4* 3.3* 3.6 3.8  CL 121*  --  116* 110 111 110 108  CO2 27  --  24 26 27 26 26   GLUCOSE 140*  --  167* 147* 111* 140* 128*  BUN 49*  --  36* 39* 39* 31* 24*  CREATININE 1.12  --  1.08 1.23 1.33* 1.11 1.07  CALCIUM  8.3*  --  7.8* 8.0* 7.8* 7.7* 7.8*  MG 2.8*  --  2.4  --  2.7* 2.7* 2.6*  PHOS 1.5*   < > 3.1 3.0 4.1 3.4 3.3   < > = values in this interval not displayed.   GFR: Estimated  Creatinine Clearance: 65.3 mL/min (by C-G formula based on SCr of 1.07 mg/dL). Liver Function Tests: Recent Labs  Lab 10/30/24 0429 10/31/24 0309 11/01/24 0315 11/02/24 0512 11/03/24 0533  AST  --   --  55* 98* 132*  ALT  --   --  77* 106* 150*  ALKPHOS  --   --  126 120 134*  BILITOT  --   --  0.4 0.3 0.3  PROT  --   --  5.4* 5.2* 5.3*  ALBUMIN 2.7* 2.9* 2.8* 2.7* 2.7*   CBG: Recent Labs  Lab 11/01/24 0308 11/01/24 0756 11/01/24 1109 11/01/24 1215 11/01/24 1626  GLUCAP 131* 149* 137* 139* 124*    Recent Results (from the past 240 hours)  MRSA Next Gen by PCR, Nasal     Status: None   Collection Time: 10/24/24  1:10 PM   Specimen: Nasal Mucosa; Nasal Swab  Result Value Ref Range Status   MRSA by PCR Next Gen NOT DETECTED NOT DETECTED Final    Comment: (NOTE) The GeneXpert MRSA Assay (FDA approved for NASAL specimens only), is one component of a comprehensive MRSA colonization surveillance program. It is not intended to diagnose MRSA infection nor to guide or monitor treatment for MRSA infections. Test performance is not FDA approved in patients less than 10 years old. Performed at [facility], [address] [place], [place] [address]   MRSA Next Gen by PCR, Nasal     Status: None   Collection Time: 10/30/24 11:33 AM   Specimen: Nasal Mucosa; Nasal Swab  Result Value Ref Range Status   MRSA by PCR Next Gen NOT DETECTED NOT DETECTED Final    Comment: (NOTE) The GeneXpert MRSA Assay (FDA approved for NASAL specimens only), is one component of a comprehensive MRSA colonization surveillance program. It is not intended to diagnose MRSA infection nor to guide or monitor treatment for MRSA infections. Test performance is not FDA approved in patients less than 59 years old. Performed at [facility], [address], [place], [place] [address]      Radiology Studies: No results found.   Scheduled Meds:  vitamin C  500 mg Per Tube BID    Chlorhexidine  Gluconate Cloth  6 each Topical Daily   donepezil   10 mg Per Tube QHS   feeding supplement (PROSource TF20)  60 mL Per Tube Daily   free water   200 mL Per Tube Q2H   lidocaine   1 patch Transdermal Q24H   pantoprazole (PROTONIX) IV  40 mg Intravenous Q12H   QUEtiapine   25 mg Per Tube Q1200   QUEtiapine   50 mg Per Tube QHS   tamsulosin  0.4 mg Oral QPC supper   thiamine  100 mg Per Tube Daily   Continuous Infusions:  feeding supplement (OSMOLITE 1.5 CAL) 1,000 mL (11/03/24 0140)   piperacillin-tazobactam (ZOSYN)  IV 3.375 g (11/03/24 0534)     LOS: 10 days  MDM: Patient is high risk for one or more organ failure.  They necessitate ongoing hospitalization for continued IV therapies and subsequent lab monitoring. Total time spent interpreting labs and vitals, reviewing the medical record, coordinating care amongst consultants and care team members, directly assessing and discussing care with the patient and/or family: 55 min  Elverta Dimiceli, DO [facility]  To contact the attending physician between 7A-7P please use Epic Chat. To contact the covering physician during after hours 7P-7A, please review Amion.  11/03/2024, 8:29 AM   *This document has been created with the assistance of dictation software. Please excuse typographical errors. *

## 2024-11-03 NOTE — Plan of Care (Signed)
  Problem: Education: Goal: Knowledge of General Education information will improve Description: Including pain rating scale, medication(s)/side effects and non-pharmacologic comfort measures Outcome: Progressing   Problem: Health Behavior/Discharge Planning: Goal: Ability to manage health-related needs will improve Outcome: Progressing   Problem: Clinical Measurements: Goal: Ability to maintain clinical measurements within normal limits will improve Outcome: Progressing Goal: Will remain free from infection Outcome: Progressing Goal: Diagnostic test results will improve Outcome: Progressing Goal: Respiratory complications will improve Outcome: Progressing Goal: Cardiovascular complication will be avoided Outcome: Progressing   Problem: Activity: Goal: Risk for activity intolerance will decrease Outcome: Progressing   Problem: Nutrition: Goal: Adequate nutrition will be maintained Outcome: Progressing   Problem: Coping: Goal: Level of anxiety will decrease Outcome: Progressing   Problem: Elimination: Goal: Will not experience complications related to bowel motility Outcome: Progressing Goal: Will not experience complications related to urinary retention Outcome: Progressing   Problem: Pain Managment: Goal: General experience of comfort will improve and/or be controlled Outcome: Progressing   Problem: Safety: Goal: Ability to remain free from injury will improve Outcome: Progressing   Problem: Skin Integrity: Goal: Risk for impaired skin integrity will decrease Outcome: Progressing   Problem: Education: Goal: Understanding of CV disease, CV risk reduction, and recovery process will improve Outcome: Progressing Goal: Individualized Educational Video(s) Outcome: Progressing   Problem: Activity: Goal: Ability to return to baseline activity level will improve Outcome: Progressing   Problem: Cardiovascular: Goal: Ability to achieve and maintain adequate  cardiovascular perfusion will improve Outcome: Progressing Goal: Vascular access site(s) Level 0-1 will be maintained Outcome: Progressing   Problem: Health Behavior/Discharge Planning: Goal: Ability to safely manage health-related needs after discharge will improve Outcome: Progressing   Problem: Safety: Goal: Non-violent Restraint(s) Outcome: Progressing

## 2024-11-03 NOTE — TOC Progression Note (Signed)
 Transition of Care (TOC) - Progression Note    Patient Details  Name: Albert [name] MRN: [id number] Date of Birth: [birth year]  Transition of Care [facility]) CM/SW Contact  Daved JONETTA Hamilton, RN Phone Number: 11/03/2024, 10:52 AM  Clinical Narrative:     TOC chart review. FL2 completed and signed on 10/31/2024. Patient disposition is pending significant clinical resolution. TOC will continue to follow.                     Expected Discharge Plan and Services                                               Social Drivers of Health (SDOH) Interventions SDOH Screenings   Food Insecurity: Patient Unable To Answer (10/24/2024)  Housing: Patient Unable To Answer (10/24/2024)  Transportation Needs: Patient Unable To Answer (10/24/2024)  Utilities: Patient Unable To Answer (10/24/2024)  Financial Resource Strain: Low Risk  (08/31/2024)   Received from [facility]  Social Connections: Unknown (10/21/2024)  Tobacco Use: High Risk (10/24/2024)    Readmission Risk Interventions    10/30/2024   12:32 PM  Readmission Risk Prevention Plan  Transportation Screening Complete  PCP or Specialist Appt within 5-7 Days Complete  Home Care Screening Complete  Medication Review (RN CM) Complete

## 2024-11-03 NOTE — Progress Notes (Signed)
 OT Cancellation Note  Patient Details Name: Albert [name] MRN: [id number] DOB: 05-Feb-1946   Cancelled Treatment:    Reason Eval/Treat Not Completed: Other (comment) (OT/PT attempted, upon entering room nursing staff stating not a good time,  assisting with pericare. Family at bedside.)  [name] Clause 11/03/2024, 4:04 PM

## 2024-11-03 NOTE — Progress Notes (Signed)
 PT Cancellation Note  Patient Details Name: Albert [name] MRN: [id number] DOB: 1946/07/31   Cancelled Treatment:    Reason Eval/Treat Not Completed: Other (comment) On arrival, nursing staff present providing pericare and stating not a good time. Family at bedside as well. PT will re-attempt at later date/time.   Maryanne Finder, PT, DPT Physical Therapist - [facility]  [facility]   Tameia Rafferty A Pauline Trainer 11/03/2024, 3:03 PM

## 2024-11-04 DIAGNOSIS — E440 Moderate protein-calorie malnutrition: Secondary | ICD-10-CM

## 2024-11-04 DIAGNOSIS — J690 Pneumonitis due to inhalation of food and vomit: Secondary | ICD-10-CM | POA: Insufficient documentation

## 2024-11-04 DIAGNOSIS — N179 Acute kidney failure, unspecified: Secondary | ICD-10-CM

## 2024-11-04 DIAGNOSIS — K219 Gastro-esophageal reflux disease without esophagitis: Secondary | ICD-10-CM

## 2024-11-04 DIAGNOSIS — R1310 Dysphagia, unspecified: Secondary | ICD-10-CM

## 2024-11-04 DIAGNOSIS — I5032 Chronic diastolic (congestive) heart failure: Secondary | ICD-10-CM | POA: Insufficient documentation

## 2024-11-04 DIAGNOSIS — E878 Other disorders of electrolyte and fluid balance, not elsewhere classified: Secondary | ICD-10-CM | POA: Diagnosis not present

## 2024-11-04 DIAGNOSIS — R338 Other retention of urine: Secondary | ICD-10-CM

## 2024-11-04 DIAGNOSIS — D62 Acute posthemorrhagic anemia: Secondary | ICD-10-CM | POA: Diagnosis not present

## 2024-11-04 DIAGNOSIS — L8990 Pressure ulcer of unspecified site, unspecified stage: Secondary | ICD-10-CM | POA: Insufficient documentation

## 2024-11-04 DIAGNOSIS — G308 Other Alzheimer's disease: Secondary | ICD-10-CM | POA: Diagnosis not present

## 2024-11-04 DIAGNOSIS — I5A Non-ischemic myocardial injury (non-traumatic): Secondary | ICD-10-CM

## 2024-11-04 DIAGNOSIS — K922 Gastrointestinal hemorrhage, unspecified: Secondary | ICD-10-CM | POA: Diagnosis not present

## 2024-11-04 LAB — COMPREHENSIVE METABOLIC PANEL WITH GFR
ALT: 130 U/L — ABNORMAL HIGH (ref 0–44)
AST: 74 U/L — ABNORMAL HIGH (ref 15–41)
Albumin: 2.8 g/dL — ABNORMAL LOW (ref 3.5–5.0)
Alkaline Phosphatase: 130 U/L — ABNORMAL HIGH (ref 38–126)
Anion gap: 10 (ref 5–15)
BUN: 21 mg/dL (ref 8–23)
CO2: 23 mmol/L (ref 22–32)
Calcium: 8.2 mg/dL — ABNORMAL LOW (ref 8.9–10.3)
Chloride: 108 mmol/L (ref 98–111)
Creatinine, Ser: 1.11 mg/dL (ref 0.61–1.24)
GFR, Estimated: >60 mL/min (ref >60)
Glucose, Bld: 94 mg/dL (ref 70–99)
Potassium: 4 mmol/L (ref 3.5–5.1)
Sodium: 141 mmol/L (ref 135–145)
Total Bilirubin: 0.4 mg/dL (ref 0.0–1.2)
Total Protein: 5.7 g/dL — ABNORMAL LOW (ref 6.5–8.1)

## 2024-11-04 LAB — CBC WITH DIFFERENTIAL/PLATELET
Abs Immature Granulocytes: 0.06 K/uL (ref 0.00–0.07)
Basophils Absolute: 0 K/uL (ref 0.0–0.1)
Basophils Relative: 0 %
Eosinophils Absolute: 0.2 K/uL (ref 0.0–0.5)
Eosinophils Relative: 2 %
HCT: 24.8 % — ABNORMAL LOW (ref 39.0–52.0)
Hemoglobin: 7.8 g/dL — ABNORMAL LOW (ref 13.0–17.0)
Immature Granulocytes: 1 %
Lymphocytes Relative: 13 %
Lymphs Abs: 1.3 K/uL (ref 0.7–4.0)
MCH: 28.2 pg (ref 26.0–34.0)
MCHC: 31.5 g/dL (ref 30.0–36.0)
MCV: 89.5 fL (ref 80.0–100.0)
Monocytes Absolute: 0.8 K/uL (ref 0.1–1.0)
Monocytes Relative: 8 %
Neutro Abs: 7.5 K/uL (ref 1.7–7.7)
Neutrophils Relative %: 76 %
Platelets: 443 K/uL — ABNORMAL HIGH (ref 150–400)
RBC: 2.77 MIL/uL — ABNORMAL LOW (ref 4.22–5.81)
RDW: 14.6 % (ref 11.5–15.5)
WBC: 9.9 K/uL (ref 4.0–10.5)
nRBC: 0 % (ref 0.0–0.2)

## 2024-11-04 LAB — PHOSPHORUS: Phosphorus: 3.5 mg/dL (ref 2.5–4.6)

## 2024-11-04 LAB — PREPARE RBC (CROSSMATCH)

## 2024-11-04 LAB — MAGNESIUM: Magnesium: 2.5 mg/dL — ABNORMAL HIGH (ref 1.7–2.4)

## 2024-11-04 MED ORDER — SODIUM CHLORIDE 0.9% IV SOLUTION: Freq: Once | INTRAVENOUS | Status: AC

## 2024-11-04 MED ORDER — RISAQUAD PO CAPS
2.0000 | ORAL_CAPSULE | Freq: Every day | ORAL | Status: DC
  Administered 2024-11-04 – 2024-11-10 (×7): 2 via ORAL
  Filled 2024-11-04 (×7): qty 2

## 2024-11-04 MED ORDER — ORAL CARE MOUTH RINSE: 15.0000 mL | OROMUCOSAL | Status: DC | PRN

## 2024-11-04 MED ORDER — ORAL CARE MOUTH RINSE
15.0000 mL | OROMUCOSAL | Status: DC
Start: 2024-11-04 — End: 2024-11-10
  Administered 2024-11-04 – 2024-11-10 (×15): 15 mL via OROMUCOSAL

## 2024-11-04 NOTE — Assessment & Plan Note (Signed)
 Patient will complete 5 days of Zosyn today.

## 2024-11-04 NOTE — Progress Notes (Signed)
 Progress Note   Patient: Albert [name] FMW:[id number] DOB: [birth year] DOA: 10/24/2024     11 DOS: the patient was seen and examined on 11/04/2024   Brief hospital course: 78 year old male with extensive past medical history including AAA with recent endovascular stent grafting 10/21/2024, dementia, Barrett's esophagus, chronic pain syndrome, history of CVA, who presented on this admission with hematemesis.  Patient has recently been on antiplatelet therapy as well as NSAIDs.  He underwent CTA which revealed active extravasation of the gastric lumen.  Vascular surgery evaluated the patient and did not feel that the bleeding was related to prior surgery.  GI was consulted and patient underwent EGD which revealed large clotted blood in the stomach and patient was unable to finish the procedure.  He was then transferred to the ICU.  Interventional radiology was then consulted and patient underwent embolization of the mid segment of the gastroepiploic artery along the greater curvature of the stomach.  Repeat EGD showed no evidence of underlying lesion or continued bleeding.  His ICU stay was then further complicated by significant agitated delirium, acute hypoxic respiratory failure due to pulmonary edema in setting of large volume resuscitation, and necessitated NG tube feeds.  He was transferred to the [facility] service on 11/15.  Before transfer he spiked a new fever and there was concern for aspiration pneumonia, so he was started empirically on Zosyn.  SLP evaluation 11/17 allowed patient to get started on dysphagia diet but he requires significant prompting and is unlikely to sustain nutrition via oral sources alone. 11/18 patient began having significant bowel movements with some melena.  Hemoglobin trending lower.  GOC conversation with the patient's wife and sons.  At this time they like to pursue all aggressive care.  Palliative has been consulted.  11/19.  Hemoglobin little lower than yesterday at 7.8,  I will give 1 unit of packed red blood cells.  Patient does have intermittent agitation and will likely not be able to do a CT angio or bleeding scan.  Since hemoglobin only drifted down, I think we can watch conservatively at this point.  Assessment and Plan: * Acute upper GI bleed Patient had coil embolization on 11/8 with interventional radiology.  Patient had 2 EGDs unable to find the source of bleeding.  Patient had quite a bit of bowel movements yesterday.  Hemoglobin drifted down to 7.8.  I will give a unit of blood also needed to get a type and cross.  Case discussed with palliative care and vascular team.  Patient not the best candidate to sit still for CT or bleeding scan.  Because of his dementia, not a great candidate for procedure and anesthesia.  Acute blood loss anemia Patient received 5 units of blood and fresh frozen plasma from 11/8 through 11/10.  Will transfuse 1 unit of blood today on 11/19.  Severe Alzheimer's dementia with agitation (HCC) On Seroquel  twice a day for agitation.  Patient also had ICU delirium earlier in the hospital course.  Patient on Aricept .  Electrolyte abnormality Hyponatremia and hypokalemia during the hospital course.  AKI (acute kidney injury) Resolved  Myocardial injury Secondary to GI bleed.  Holding aspirin  and Plavix .  Acute urinary retention On Flomax.  Continue Foley catheter.  Chronic diastolic CHF (congestive heart failure) (HCC) Last EF 65%.  Had volume overload during the hospitalization secondary to large volume resuscitation.  Not currently in heart failure.  Dysphagia NG tube removed on 11/8.  Patient on dysphagia diet.  Patient's wife states that  she does not want to place a PEG tube.  Aspiration pneumonia [facility]) Patient will complete 5 days of Zosyn today.  Pressure injury Wound 10/29/24 0900 Pressure Injury Coccyx Medial;Left Deep Tissue Pressure Injury - Purple or maroon localized area of discolored intact skin or  blood-filled blister due to damage of underlying soft tissue from pressure and/or shear. (Active)     Wound 10/30/24 0800 Pressure Injury Sacrum Mid Stage 2 -  Partial thickness loss of dermis presenting as a shallow open injury with a red, pink wound bed without slough. (Active)     Wound 10/30/24 0800 Pressure Injury Buttocks Left;Lower Stage 2 -  Partial thickness loss of dermis presenting as a shallow open injury with a red, pink wound bed without slough. (Active)   Documented on 11/13 and 11/14.  Patient admitted on 11/8.  Malnutrition of moderate degree Continue dysphagia diet  Acute respiratory failure with hypoxia (HCC) Patient this morning on 5 L high flow nasal cannula looks like this afternoon on 4 L.  AAA (abdominal aortic aneurysm) without rupture Patient had stent grafting on 11/5.  Hyperlipidemia, unspecified Holding cholesterol medication  GERD (gastroesophageal reflux disease) On twice daily Protonix      Subjective: Patient answers some simple questions.  Patient had a lot of diarrhea yesterday.  Hemoglobin only drifted down to 7.8.  Initially admitted 11 days ago with vomiting blood  Physical Exam: Vitals:   11/04/24 0500 11/04/24 0801 11/04/24 1213 11/04/24 1241  BP:  137/67 (!) 140/77 129/79  Pulse:  89 94 88  Resp:  16 14 15   Temp:  98.7 F (37.1 C) 98.9 F (37.2 C) 98.6 F (37 C)  TempSrc:  Oral Axillary Oral  SpO2:  97% 98% 97%  Weight: 84 kg     Height:       Physical Exam HENT:     Head: Normocephalic.  Eyes:     General: Lids are normal.     Conjunctiva/sclera: Conjunctivae normal.  Cardiovascular:     Rate and Rhythm: Normal rate and regular rhythm.     Heart sounds: S1 normal and S2 normal. Murmur heard.     Systolic murmur is present with a grade of 2/6.  Pulmonary:     Breath sounds: Examination of the right-lower field reveals decreased breath sounds. Examination of the left-lower field reveals decreased breath sounds. Decreased  breath sounds present. No wheezing, rhonchi or rales.  Abdominal:     Palpations: Abdomen is soft.     Tenderness: There is no abdominal tenderness.  Musculoskeletal:     Right lower leg: Swelling present.     Left lower leg: Swelling present.  Skin:    General: Skin is warm.     Findings: No rash.  Neurological:     Mental Status: He is alert.     Data Reviewed: Creatinine 1.11, alkaline phosphatase 130, AST 74, ALT 130, white blood cell count 9.9, hemoglobin 7.8, platelet count 443.  Family Communication: Spoke with wife at the bedside  Disposition: Status is: Inpatient Remains inpatient appropriate because: Continue to monitor for signs of bleeding.  Patient's wife is leaning towards hospice but family would like to see how things go over the next few days.  Planned Discharge Destination: To be determined.    Time spent: 28 minutes  Author: Charlie Patterson, MD 11/04/2024 1:43 PM  For on call review [email or web address].

## 2024-11-04 NOTE — Hospital Course (Addendum)
 78 year old male with extensive past medical history including AAA with recent endovascular stent grafting 10/21/2024, dementia, Barrett's esophagus, chronic pain syndrome, history of CVA, who presented on this admission with hematemesis.  Patient has recently been on antiplatelet therapy as well as NSAIDs.  He underwent CTA which revealed active extravasation of the gastric lumen.  Vascular surgery evaluated the patient and did not feel that the bleeding was related to prior surgery.  GI was consulted and patient underwent EGD which revealed large clotted blood in the stomach and patient was unable to finish the procedure.  He was then transferred to the ICU.  Interventional radiology was then consulted and patient underwent embolization of the mid segment of the gastroepiploic artery along the greater curvature of the stomach.  Repeat EGD showed no evidence of underlying lesion or continued bleeding.  His ICU stay was then further complicated by significant agitated delirium, acute hypoxic respiratory failure due to pulmonary edema in setting of large volume resuscitation, and necessitated NG tube feeds.  He was transferred to the [facility] service on 11/15.  Before transfer he spiked a new fever and there was concern for aspiration pneumonia, so he was started empirically on Zosyn .  SLP evaluation 11/17 allowed patient to get started on dysphagia diet but he requires significant prompting and is unlikely to sustain nutrition via oral sources alone. 11/18 patient began having significant bowel movements with some melena.  Hemoglobin trending lower.  GOC conversation with the patient's wife and sons.  At this time they like to pursue all aggressive care.  Palliative has been consulted.  11/19.  Hemoglobin little lower than yesterday at 7.8, I will give 1 unit of packed red blood cells.  Patient does have intermittent agitation and will likely not be able to do a CT angio or bleeding scan.  Since hemoglobin only drifted  down, I think we can watch conservatively at this point. 11/20.  Hemoglobin 8.4, patient ate a little bit better, patient walked with physical therapy. 11/21.  Hemoglobin 8.3 11/22.  Patient off oxygen this morning. 11/23.  Foley catheter removed last night and patient urinating.  Creatinine down to 1.0 and hemoglobin 8.3.  Will give IV iron .  Patient doing a lot better with his mental status than earlier in the hospitalization. 11/24.  We got a bed offer from rehab.  Sitter discontinued.  Patient walking back and forth to the bathroom with staff.  Insurance authorization started. 11/25.  Discharge to rehab.  Hemoglobin 8.7.

## 2024-11-04 NOTE — Assessment & Plan Note (Signed)
 Secondary to GI bleed.  Holding aspirin  and Plavix .

## 2024-11-04 NOTE — Assessment & Plan Note (Addendum)
 Speech pathology increased to dysphagia 3 diet with thin liquids

## 2024-11-04 NOTE — Assessment & Plan Note (Signed)
 Patient had stent grafting on 11/5.

## 2024-11-04 NOTE — Assessment & Plan Note (Signed)
 Holding cholesterol medication

## 2024-11-04 NOTE — Progress Notes (Addendum)
 Daily Progress Note   Patient Name: Albert [name]       Date: 11/04/2024 DOB: 1946-11-30  Age: 78 y.o. MRN#: [id number] Attending Physician: Josette Ade, MD Primary Care Physician: Lenon [name] ORN, MD Admit Date: 10/24/2024  Reason for Consultation/Follow-up: Establishing goals of care  Subjective: Notes and labs reviewed.  In to see patient.  He is currently resting in bed at this time and dozes, and then awaking agitated and trying to bite and remove his mittens.  His son Albert [name] is at the bedside.  Son who is at bedside, additional son, and patient's wife were part of conversation yesterday regarding care moving forward.  Son discusses that at baseline patient wanders, sundown's and becomes agitated.  He states he eats cheeseburgers and Gatorade tots, he can no longer use utensils.  He states patient is able to recognize his wife, and sometimes the 2 children-he and his brother, and other times not. Son discusses that they are wanting to see how patient does over the next few days.  Inquired about what patient would want if we could speak, son stated that the family is waiting for to see how he does.  Wife entered discussion.  She discusses his status.  They discussed the roller coaster they have been on since his AAA repair.  They shared the discussions they have had with providers and nursing staff over these past 11 days.  They discussed their frustrations.    Began to broach goals of care with wife, and son reiterated decisions to provide time to see how patient does. Patient's wife asked me to pull up patient's advance directive for review.  Reviewed the advance directive with her and son Albert [name] at bedside.  The advanced directive lists wife as primary surrogate decision maker,  and patient's sister as secondary HPOA.  She states she desired for the patient's sister to be his secondary H POA as they have always been close, and she is sure that sister will make sure he is taken care of, and ensure that care that is provided is what the patient would want.  Discussed patient advocacy and autonomy.  We discussed that the document also contains a living will that discusses a desire for natural death, and limits to care.  We discussed quality versus quantity of life, and determining  what boundaries to care would be for him.   She discusses his bleeding.  She and son discuss wanting to determine options for this.  They both state they understand that another EGD with GI is not possible.  Possible workup and intervention with vascular surgery was discussed with them yesterday, and they would like to proceed with this.    We discussed his poor p.o. intake.  We discussed that at home he would only eat cheeseburgers and tater tots.  She states he does like things that are sweet and cold.  She is amenable to trying Magic cups to see if he will take this.  She states she understands he would need to be able to eat enough to maintain himself.     Length of Stay: 11  Current Medications: Scheduled Meds:   vitamin C  500 mg Oral BID   Chlorhexidine  Gluconate Cloth  6 each Topical Daily   donepezil   10 mg Per Tube QHS   feeding supplement  237 mL Oral TID BM   lidocaine   1 patch Transdermal Q24H   multivitamin with minerals  1 tablet Oral Daily   pantoprazole (PROTONIX) IV  40 mg Intravenous Q12H   QUEtiapine   25 mg Per Tube Q1200   QUEtiapine   50 mg Per Tube QHS   tamsulosin  0.4 mg Oral QPC supper    Continuous Infusions:  piperacillin-tazobactam (ZOSYN)  IV 3.375 g (11/04/24 0505)    PRN Meds: acetaminophen  **OR** acetaminophen , guaiFENesin, haloperidol  lactate, HYDROmorphone  (DILAUDID ) injection, ipratropium-albuterol, lip balm, loperamide, ondansetron  **OR** ondansetron   (ZOFRAN ) IV, mouth rinse, polyethylene glycol  Physical Exam Pulmonary:     Effort: Pulmonary effort is normal.  Neurological:     Mental Status: He is alert.     Comments: Agitated, mittens in place             Vital Signs: BP 129/79   Pulse 88   Temp 98.6 F (37 C) (Oral)   Resp 15   Ht 6' 1 (1.854 m)   Wt 84 kg   SpO2 97%   BMI 24.43 kg/m  SpO2: SpO2: 97 % O2 Device: O2 Device: Nasal Cannula O2 Flow Rate: O2 Flow Rate (L/min): 4 L/min  Intake/output summary:  Intake/Output Summary (Last 24 hours) at 11/04/2024 1301 Last data filed at 11/04/2024 9094 Gross per 24 hour  Intake 360 ml  Output 1200 ml  Net -840 ml   LBM: Last BM Date : 11/03/24 Baseline Weight: Weight: 90 kg Most recent weight: Weight: 84 kg   Patient Active Problem List   Diagnosis Date Noted   Malnutrition of moderate degree 10/28/2024   Upper GI bleed 10/27/2024   Acute respiratory failure with hypoxia (HCC) 10/27/2024   Toxic metabolic encephalopathy 10/27/2024   GI bleeding 10/24/2024   Acute upper GI bleed 10/24/2024   AAA (abdominal aortic aneurysm) without rupture 08/28/2024   Acute lacunar stroke (HCC) 06/23/2024   Severe Alzheimer's dementia with agitation (HCC) 06/24/2020   Toe fracture 02/22/2020   Displaced fracture of proximal phalanx of right lesser toe(s), initial encounter for closed fracture 07/30/2019   H/O stroke without residual deficits 03/03/2018   Prediabetes 08/01/2017   Anxiety 12/26/2016   Common migraine 12/26/2016   GERD (gastroesophageal reflux disease) 12/26/2016   Health care maintenance 12/26/2016   HTN, goal below 140/90 12/26/2016   Hypercholesterolemia 12/26/2016   Degenerative spondylolisthesis 03/13/2016     Recommendations/Plan: Would recommend vanilla Magic cups his wife feels that patient would be accepting  of this. Vascular surgery to speak with family regarding any testing or options. Patient's wife is surrogate decision maker, secondary H  POA is patient's Sister Albert [name]  Document is located under ACP tab if needed.  Code Status:    Code Status Orders  (From admission, onward)           Start     Ordered   10/25/24 1807  Do not attempt resuscitation (DNR)- Limited -Do Not Intubate (DNI)  (Code Status)  Continuous       Question Answer Comment  If pulseless and not breathing No CPR or chest compressions.   In Pre-Arrest Conditions (Patient Is Breathing and Has A Pulse) Do not intubate. Provide all appropriate non-invasive medical interventions. Avoid ICU transfer unless indicated or required.   Consent: Discussion documented in EHR or advanced directives reviewed      10/25/24 1806           Code Status History     Date Active Date Inactive Code Status Order ID Comments User Context   10/24/2024 1146 10/25/2024 1806 Full Code [id number]  Parris Manna, MD Inpatient   10/24/2024 1043 10/24/2024 1146 Full Code [id number]  Roann Gouty, MD Inpatient   10/21/2024 1617 10/22/2024 1602 Full Code [id number]  Marea Selinda RAMAN, MD Inpatient   06/23/2024 1756 06/26/2024 2306 Full Code [id number]  Cox, Greig SAILOR, DO ED      Advance Directive Documentation    Flowsheet Row Most Recent Value  Type of Advance Directive Healthcare Power of Attorney  Pre-existing out of facility DNR order (yellow form or pink MOST form) --  MOST Form in Place? --   Camelia Lewis, NP  Please contact Palliative Medicine Team phone at [phone] for questions and concerns.

## 2024-11-04 NOTE — Assessment & Plan Note (Signed)
-  On twice daily Protonix

## 2024-11-04 NOTE — Assessment & Plan Note (Addendum)
 Resolved.  Last creatinine 1.0.

## 2024-11-04 NOTE — Assessment & Plan Note (Addendum)
 Patient received 5 units of blood and 1 unit of fresh frozen plasma from 11/8 through 11/10.  Given 1 unit of blood on 11/19.  Last hemoglobin 8.7.

## 2024-11-04 NOTE — Assessment & Plan Note (Signed)
 Wound 10/29/24 0900 Pressure Injury Coccyx Medial;Left Deep Tissue Pressure Injury - Purple or maroon localized area of discolored intact skin or blood-filled blister due to damage of underlying soft tissue from pressure and/or shear. (Active)     Wound 10/30/24 0800 Pressure Injury Sacrum Mid Stage 2 -  Partial thickness loss of dermis presenting as a shallow open injury with a red, pink wound bed without slough. (Active)     Wound 10/30/24 0800 Pressure Injury Buttocks Left;Lower Stage 2 -  Partial thickness loss of dermis presenting as a shallow open injury with a red, pink wound bed without slough. (Active)   Documented on 11/13 and 11/14.  Patient admitted on 11/8.

## 2024-11-04 NOTE — Assessment & Plan Note (Signed)
 Hyponatremia and hypokalemia during the hospital course.

## 2024-11-04 NOTE — Assessment & Plan Note (Addendum)
 Previously on high flow nasal cannula 5 L.  Today off oxygen.

## 2024-11-04 NOTE — Assessment & Plan Note (Addendum)
 Increased Seroquel  at nighttime to 50 mg.  Risperdal  as needed for agitation.  Discontinued Haldol .  Patient on Aricept .  Patient has a comptroller.  Since I do not have a rehab bed offer yet and I will keep the sitter going.  Once I get a rehab bed offer I will get rid of the sitter.  Mental status much improved from earlier in the week when I saw him.  Patient able to follow commands and stand up for me.

## 2024-11-04 NOTE — Assessment & Plan Note (Signed)
 Last EF 65%.  Had volume overload during the hospitalization secondary to large volume resuscitation.  Not currently in heart failure.

## 2024-11-04 NOTE — Assessment & Plan Note (Signed)
 NG tube removed on 11/8.  Patient on dysphagia diet.  Patient's wife states that she does not want to place a PEG tube.

## 2024-11-04 NOTE — Assessment & Plan Note (Addendum)
 On Flomax .  Will try to remove Foley catheter this evening around midnight.

## 2024-11-04 NOTE — Assessment & Plan Note (Addendum)
 Patient had coil embolization on 11/8 with interventional radiology.  Patient had 2 EGDs unable to find the source of bleeding.  Patient had quite a bit of bowel movements on 11/18.  Hemoglobin drifted down to 7.8 on 11/19 and given a unit of blood.  Today's hemoglobin 8.3.  Conservative management.  Spoke with gastroenterology and patient's wife declined endoscopy and does not want to put him through anesthesia again.

## 2024-11-04 NOTE — Progress Notes (Signed)
 OT Cancellation Note  Patient Details Name: Albert [name] MRN: [id number] DOB: [birth year]   Cancelled Treatment:    Reason Eval/Treat Not Completed: Patient at procedure or test/ unavailable;Other (comment) (blood transfusion)  [name] Clause 11/04/2024, 2:16 PM

## 2024-11-05 DIAGNOSIS — E785 Hyperlipidemia, unspecified: Secondary | ICD-10-CM

## 2024-11-05 DIAGNOSIS — R338 Other retention of urine: Secondary | ICD-10-CM | POA: Diagnosis not present

## 2024-11-05 DIAGNOSIS — D62 Acute posthemorrhagic anemia: Secondary | ICD-10-CM | POA: Diagnosis not present

## 2024-11-05 DIAGNOSIS — F02C11 Dementia in other diseases classified elsewhere, severe, with agitation: Secondary | ICD-10-CM | POA: Diagnosis not present

## 2024-11-05 DIAGNOSIS — K219 Gastro-esophageal reflux disease without esophagitis: Secondary | ICD-10-CM | POA: Diagnosis not present

## 2024-11-05 DIAGNOSIS — G309 Alzheimer's disease, unspecified: Secondary | ICD-10-CM | POA: Diagnosis not present

## 2024-11-05 DIAGNOSIS — I5032 Chronic diastolic (congestive) heart failure: Secondary | ICD-10-CM

## 2024-11-05 DIAGNOSIS — K922 Gastrointestinal hemorrhage, unspecified: Secondary | ICD-10-CM | POA: Diagnosis not present

## 2024-11-05 LAB — TYPE AND SCREEN
ABO/RH(D): A POS
Antibody Screen: NEGATIVE
Unit division: 0

## 2024-11-05 LAB — COMPREHENSIVE METABOLIC PANEL WITH GFR
ALT: 108 U/L — ABNORMAL HIGH (ref 0–44)
AST: 48 U/L — ABNORMAL HIGH (ref 15–41)
Albumin: 2.9 g/dL — ABNORMAL LOW (ref 3.5–5.0)
Alkaline Phosphatase: 127 U/L — ABNORMAL HIGH (ref 38–126)
Anion gap: 8 (ref 5–15)
BUN: 21 mg/dL (ref 8–23)
CO2: 25 mmol/L (ref 22–32)
Calcium: 8.3 mg/dL — ABNORMAL LOW (ref 8.9–10.3)
Chloride: 111 mmol/L (ref 98–111)
Creatinine, Ser: 1.11 mg/dL (ref 0.61–1.24)
GFR, Estimated: >60 mL/min (ref >60)
Glucose, Bld: 108 mg/dL — ABNORMAL HIGH (ref 70–99)
Potassium: 4.2 mmol/L (ref 3.5–5.1)
Sodium: 143 mmol/L (ref 135–145)
Total Bilirubin: 0.4 mg/dL (ref 0.0–1.2)
Total Protein: 5.9 g/dL — ABNORMAL LOW (ref 6.5–8.1)

## 2024-11-05 LAB — CBC WITH DIFFERENTIAL/PLATELET
Abs Immature Granulocytes: 0.05 K/uL (ref 0.00–0.07)
Basophils Absolute: 0 K/uL (ref 0.0–0.1)
Basophils Relative: 0 %
Eosinophils Absolute: 0.2 K/uL (ref 0.0–0.5)
Eosinophils Relative: 2 %
HCT: 26.5 % — ABNORMAL LOW (ref 39.0–52.0)
Hemoglobin: 8.4 g/dL — ABNORMAL LOW (ref 13.0–17.0)
Immature Granulocytes: 1 %
Lymphocytes Relative: 14 %
Lymphs Abs: 1.4 K/uL (ref 0.7–4.0)
MCH: 28.5 pg (ref 26.0–34.0)
MCHC: 31.7 g/dL (ref 30.0–36.0)
MCV: 89.8 fL (ref 80.0–100.0)
Monocytes Absolute: 0.7 K/uL (ref 0.1–1.0)
Monocytes Relative: 7 %
Neutro Abs: 7.1 K/uL (ref 1.7–7.7)
Neutrophils Relative %: 76 %
Platelets: 477 K/uL — ABNORMAL HIGH (ref 150–400)
RBC: 2.95 MIL/uL — ABNORMAL LOW (ref 4.22–5.81)
RDW: 14.6 % (ref 11.5–15.5)
WBC: 9.4 K/uL (ref 4.0–10.5)
nRBC: 0 % (ref 0.0–0.2)

## 2024-11-05 LAB — MAGNESIUM: Magnesium: 2.6 mg/dL — ABNORMAL HIGH (ref 1.7–2.4)

## 2024-11-05 LAB — BPAM RBC
Blood Product Expiration Date: 202512192359
ISSUE DATE / TIME: 202511191221
Unit Type and Rh: 6200

## 2024-11-05 LAB — PHOSPHORUS: Phosphorus: 3.5 mg/dL (ref 2.5–4.6)

## 2024-11-05 MED ORDER — HALOPERIDOL LACTATE 5 MG/ML IJ SOLN
1.0000 mg | Freq: Four times a day (QID) | INTRAMUSCULAR | Status: DC | PRN
  Administered 2024-11-05: 1 mg via INTRAVENOUS
  Filled 2024-11-05: qty 1

## 2024-11-05 MED ORDER — RISPERIDONE 0.5 MG PO TABS
0.5000 mg | ORAL_TABLET | Freq: Two times a day (BID) | ORAL | Status: DC | PRN
  Administered 2024-11-05 – 2024-11-07 (×3): 0.5 mg via ORAL
  Filled 2024-11-05 (×4): qty 1

## 2024-11-05 MED ORDER — QUETIAPINE FUMARATE 25 MG PO TABS
25.0000 mg | ORAL_TABLET | Freq: Every day | ORAL | Status: DC
  Administered 2024-11-05: 25 mg
  Filled 2024-11-05: qty 1

## 2024-11-05 NOTE — Progress Notes (Signed)
 Daily Progress Note   Patient Name: Albert [name]       Date: 11/05/2024 DOB: 25-Apr-1946  Age: 78 y.o. MRN#: [id number] Attending Physician: Josette Ade, MD Primary Care Physician: Lenon [name] ORN, MD Admit Date: 10/24/2024  Reason for Consultation/Follow-up: Establishing goals of care  Subjective: Blood transfusion completed yesterday.  Case discussed with vascular surgery who advises they have spoken with family regarding the need for anesthesia with any intervention that could be provided.  Case discussed with attending, and GI has discussed with family the need for anesthesia with any interventions that they provide.  Plans in place to continue trending hemoglobin.  In to see patient.  He is currently sitting on the side of the bed with PT/OT at bedside, along with wife and son Glendia.  Inquired about how last night went, and son stated that it went well.  He also stated patient was eating more than he had been.  PT/OT walked patient to the door and back.  Per discussion, recommendations made for rehab.  Family did not speak with me further.   Per epic chat from a member of the care team, family feels that they are being pressured in to hospice care.  Case reviewed further with attending, and PMT will sign off.  Attending team will reconsult if needs arise, or if family wishes to speak with PMT further.  Length of Stay: 12  Current Medications: Scheduled Meds:   acidophilus  2 capsule Oral Daily   vitamin C   500 mg Oral BID   Chlorhexidine  Gluconate Cloth  6 each Topical Daily   donepezil   10 mg Per Tube QHS   feeding supplement  237 mL Oral TID BM   lidocaine   1 patch Transdermal Q24H   multivitamin with minerals  1 tablet Oral Daily   mouth rinse  15 mL Mouth Rinse 4  times per day   pantoprazole  (PROTONIX ) IV  40 mg Intravenous Q12H   QUEtiapine   25 mg Per Tube Q1200   QUEtiapine   50 mg Per Tube QHS   tamsulosin   0.4 mg Oral QPC supper    Continuous Infusions:   PRN Meds: acetaminophen  **OR** acetaminophen , guaiFENesin , haloperidol  lactate, HYDROmorphone  (DILAUDID ) injection, ipratropium-albuterol , lip balm, loperamide , ondansetron  **OR** ondansetron  (ZOFRAN ) IV, mouth rinse, mouth rinse  Physical Exam Pulmonary:  Effort: Pulmonary effort is normal.  Neurological:     Mental Status: He is alert.             Vital Signs: BP (!) 140/68 (BP Location: Left Arm)   Pulse 89   Temp 98.3 F (36.8 C) (Axillary)   Resp 20   Ht 6' 1 (1.854 m)   Wt 86.5 kg   SpO2 98%   BMI 25.16 kg/m  SpO2: SpO2: 98 % O2 Device: O2 Device: Nasal Cannula O2 Flow Rate: O2 Flow Rate (L/min): 4 L/min  Intake/output summary:  Intake/Output Summary (Last 24 hours) at 11/05/2024 1237 Last data filed at 11/05/2024 0550 Gross per 24 hour  Intake 1139.7 ml  Output 1575 ml  Net -435.3 ml   LBM: Last BM Date : 11/04/24 Baseline Weight: Weight: 90 kg Most recent weight: Weight: 86.5 kg    Patient Active Problem List   Diagnosis Date Noted   Pressure injury 11/04/2024   Acute blood loss anemia 11/04/2024   Aspiration pneumonia (HCC) 11/04/2024   Dysphagia 11/04/2024   Chronic diastolic CHF (congestive heart failure) (HCC) 11/04/2024   Acute urinary retention 11/04/2024   Myocardial injury 11/04/2024   AKI (acute kidney injury) 11/04/2024   Electrolyte abnormality 11/04/2024   Malnutrition of moderate degree 10/28/2024   Upper GI bleed 10/27/2024   Acute respiratory failure with hypoxia (HCC) 10/27/2024   Toxic metabolic encephalopathy 10/27/2024   GI bleeding 10/24/2024   Acute upper GI bleed 10/24/2024   AAA (abdominal aortic aneurysm) without rupture 08/28/2024   Acute lacunar stroke (HCC) 06/23/2024   Severe Alzheimer's dementia with agitation  (HCC) 06/24/2020   Toe fracture 02/22/2020   Displaced fracture of proximal phalanx of right lesser toe(s), initial encounter for closed fracture 07/30/2019   H/O stroke without residual deficits 03/03/2018   Prediabetes 08/01/2017   Anxiety 12/26/2016   Common migraine 12/26/2016   GERD (gastroesophageal reflux disease) 12/26/2016   Health care maintenance 12/26/2016   HTN, goal below 140/90 12/26/2016   Hyperlipidemia, unspecified 12/26/2016   Degenerative spondylolisthesis 03/13/2016    Palliative Care Assessment & Plan   Recommendations/Plan: Case discussed with attending.  PMT will sign off as a member of the care team advises family feels pressured in to hospice care.  Please reconsult if needs arise or if family wishes to speak with PMT.  Code Status:    Code Status Orders  (From admission, onward)           Start     Ordered   10/25/24 1807  Do not attempt resuscitation (DNR)- Limited -Do Not Intubate (DNI)  (Code Status)  Continuous       Question Answer Comment  If pulseless and not breathing No CPR or chest compressions.   In Pre-Arrest Conditions (Patient Is Breathing and Has A Pulse) Do not intubate. Provide all appropriate non-invasive medical interventions. Avoid ICU transfer unless indicated or required.   Consent: Discussion documented in EHR or advanced directives reviewed      10/25/24 1806           Code Status History     Date Active Date Inactive Code Status Order ID Comments User Context   10/24/2024 1146 10/25/2024 1806 Full Code [id number]  Parris Manna, MD Inpatient   10/24/2024 1043 10/24/2024 1146 Full Code [id number]  Roann Gouty, MD Inpatient   10/21/2024 1617 10/22/2024 1602 Full Code [id number]  Marea Selinda RAMAN, MD Inpatient   06/23/2024 1756 06/26/2024 2306 Full Code [id number]  Cox, Amy  N, DO ED      Advance Directive Documentation    Flowsheet Row Most Recent Value  Type of Advance Directive Healthcare Power of Attorney  Pre-existing  out of facility DNR order (yellow form or pink MOST form) --  MOST Form in Place? --    Camelia Lewis, NP  Please contact Palliative Medicine Team phone at [phone] for questions and concerns.

## 2024-11-05 NOTE — Progress Notes (Signed)
 Rogelia Copping, MD [facility]   [address] [place], [place] [address] Phone: [phone] Fax : [phone]   Subjective: The patient had a drop in his hemoglobin and was transfused blood last night with an appropriate correction of his hemoglobin.  The patient is not able to give much history due to his dementia.   Objective: Vital signs in last 24 hours: Vitals:   11/04/24 1703 11/04/24 2055 11/05/24 0500 11/05/24 0527  BP: (!) 144/85 (!) 145/80  (!) 140/68  Pulse: 100 95  89  Resp: 18 16  20   Temp: 97.8 F (36.6 C) 99.1 F (37.3 C)  98.3 F (36.8 C)  TempSrc: Oral   Axillary  SpO2: 97% 97%  98%  Weight:   86.5 kg   Height:       Weight change: 2.5 kg  Intake/Output Summary (Last 24 hours) at 11/05/2024 0800 Last data filed at 11/05/2024 0550 Gross per 24 hour  Intake 1259.7 ml  Output 1575 ml  Net -315.3 ml     Exam:  General: Patient lethargic laying in bed in no apparent distress  Lab Results: @LABTEST2 @ Micro Results: Recent Results (from the past 240 hours)  MRSA Next Gen by PCR, Nasal     Status: None   Collection Time: 10/30/24 11:33 AM   Specimen: Nasal Mucosa; Nasal Swab  Result Value Ref Range Status   MRSA by PCR Next Gen NOT DETECTED NOT DETECTED Final    Comment: (NOTE) The GeneXpert MRSA Assay (FDA approved for NASAL specimens only), is one component of a comprehensive MRSA colonization surveillance program. It is not intended to diagnose MRSA infection nor to guide or monitor treatment for MRSA infections. Test performance is not FDA approved in patients less than 64 years old. Performed at [facility], [address], [place], [place] [address]    Studies/Results: No results found. Medications: I have reviewed the patient's current medications. Scheduled Meds:  acidophilus  2 capsule Oral Daily   vitamin C   500 mg Oral BID   Chlorhexidine  Gluconate Cloth  6 each Topical Daily   donepezil   10 mg Per Tube QHS    feeding supplement  237 mL Oral TID BM   lidocaine   1 patch Transdermal Q24H   multivitamin with minerals  1 tablet Oral Daily   mouth rinse  15 mL Mouth Rinse 4 times per day   pantoprazole  (PROTONIX ) IV  40 mg Intravenous Q12H   QUEtiapine   25 mg Per Tube Q1200   QUEtiapine   50 mg Per Tube QHS   tamsulosin   0.4 mg Oral QPC supper   Continuous Infusions: PRN Meds:.acetaminophen  **OR** acetaminophen , guaiFENesin , haloperidol  lactate, HYDROmorphone  (DILAUDID ) injection, ipratropium-albuterol , lip balm, loperamide , ondansetron  **OR** ondansetron  (ZOFRAN ) IV, mouth rinse, mouth rinse   Assessment: Principal Problem:   Acute upper GI bleed Active Problems:   Severe Alzheimer's dementia with agitation (HCC)   GERD (gastroesophageal reflux disease)   H/O stroke without residual deficits   Hyperlipidemia, unspecified   AAA (abdominal aortic aneurysm) without rupture   GI bleeding   Upper GI bleed   Acute respiratory failure with hypoxia (HCC)   Toxic metabolic encephalopathy   Malnutrition of moderate degree   Pressure injury   Acute blood loss anemia   Aspiration pneumonia (HCC)   Dysphagia   Chronic diastolic CHF (congestive heart failure) (HCC)   Acute urinary retention   Myocardial injury   AKI (acute kidney injury)   Electrolyte abnormality    Plan:  I had a long talk with the patient's wife today about the patient having continued bleeding.  I had suggested that we can do a repeat upper endoscopy on the patient since he has had 2 EGDs and a IR intervention without the bleeding completely stopping.  I explained to her the upper endoscopy would be done under anesthesia care. The wife has indicated that her and her children have decided not to put him through any further sedation due to his already confused state and history of dementia. I recommended continued supportive care with blood transfusions as needed.  I will sign off.  Please call if any further GI concerns or  questions.  We would like to thank you for the opportunity to participate in the care of Albert [name].    LOS: 12 days   Rogelia Copping, MD.FACG 11/05/2024, 8:00 AM Pager [phone] 7am-5pm  Check AMION for 5pm -7am coverage and on weekends

## 2024-11-05 NOTE — Plan of Care (Signed)
  Problem: Education: Goal: Knowledge of General Education information will improve Description: Including pain rating scale, medication(s)/side effects and non-pharmacologic comfort measures Outcome: Progressing   Problem: Health Behavior/Discharge Planning: Goal: Ability to manage health-related needs will improve Outcome: Progressing   Problem: Clinical Measurements: Goal: Ability to maintain clinical measurements within normal limits will improve Outcome: Progressing Goal: Will remain free from infection Outcome: Progressing Goal: Diagnostic test results will improve Outcome: Progressing Goal: Respiratory complications will improve Outcome: Progressing Goal: Cardiovascular complication will be avoided Outcome: Progressing   Problem: Activity: Goal: Risk for activity intolerance will decrease Outcome: Progressing   Problem: Nutrition: Goal: Adequate nutrition will be maintained Outcome: Progressing   Problem: Coping: Goal: Level of anxiety will decrease Outcome: Progressing   Problem: Elimination: Goal: Will not experience complications related to bowel motility Outcome: Progressing Goal: Will not experience complications related to urinary retention Outcome: Progressing   Problem: Pain Managment: Goal: General experience of comfort will improve and/or be controlled Outcome: Progressing   Problem: Safety: Goal: Ability to remain free from injury will improve Outcome: Progressing   Problem: Skin Integrity: Goal: Risk for impaired skin integrity will decrease Outcome: Progressing   Problem: Education: Goal: Understanding of CV disease, CV risk reduction, and recovery process will improve Outcome: Progressing Goal: Individualized Educational Video(s) Outcome: Progressing   Problem: Activity: Goal: Ability to return to baseline activity level will improve Outcome: Progressing   Problem: Cardiovascular: Goal: Ability to achieve and maintain adequate  cardiovascular perfusion will improve Outcome: Progressing Goal: Vascular access site(s) Level 0-1 will be maintained Outcome: Progressing   Problem: Health Behavior/Discharge Planning: Goal: Ability to safely manage health-related needs after discharge will improve Outcome: Progressing   Problem: Safety: Goal: Non-violent Restraint(s) Outcome: Progressing

## 2024-11-05 NOTE — Progress Notes (Signed)
 Occupational Therapy Treatment Patient Details Name: Albert [name] MRN: [id number] DOB: Feb 08, 1946 Today's Date: 11/05/2024   History of present illness 78 year old male admitted with acute blood loss anemia secondary to upper GI bleed status post coil embolization of the gastroepiploic artery on 11/8 with EGD twice not show any underlying lesion or source of continued bleed. Pt intubated for hemorraghic shock then extubated to [place] on 10/27/2024. MD dx include: acute metabolic encephalopathy complicated by dementia, HFpEF, HTN, HLD, acute hypoxic respiratory failure, pulmonary edema, acute blood loss anemia, hypernatremia.   OT comments  Patient seen for OT/PT treatment on this date. Upon arrival to room patient resting in bed with family present, agreeable to treatment. Patient able to initiate bed mobility but required A for BLE and to lift trunk; he required redirection while sitting EOB while RN applying lidocaine  patch; patient on 4L at start, trialed 2L and patient dropped below 90%; returned to 4L. Patient performed sit<>stand from EOB with min/mod A x 2 with r/w, multimodal cues required for hand placement and body mechanics while ambulating, redirection required; patient with narrowed base of support while turning after about 10 feet. Patient agreeable to sit up in recliner. OT instructed on importance of day/night differentiation and opening blinds during the day to help reduce hospital delerium, family agreeable. Patient ended treatment in recliner with family and PT still present in room.  Patient making good progress toward goals, will continue to follow POC. Discharge recommendation remains appropriate.        If plan is discharge home, recommend the following:  Two people to help with walking and/or transfers;Two people to help with bathing/dressing/bathroom;Help with stairs or ramp for entrance;Supervision due to cognitive status   Equipment Recommendations  Other (comment)     Recommendations for Other Services      Precautions / Restrictions Precautions Precautions: Fall Recall of Precautions/Restrictions: Impaired Restrictions Weight Bearing Restrictions Per Provider Order: No       Mobility Bed Mobility Overal bed mobility: Needs Assistance Bed Mobility: Supine to Sit     Supine to sit: Mod assist, +2 for physical assistance, +2 for safety/equipment, HOB elevated, Used rails     General bed mobility comments: patietn was able to initiate but required A to complete all parts    Transfers Overall transfer level: Needs assistance Equipment used: Rolling walker (2 wheels) Transfers: Sit to/from Stand Sit to Stand: Min assist, +2 physical assistance, From elevated surface                 Balance                                           ADL either performed or assessed with clinical judgement   ADL                                         General ADL Comments: ADLs not addressed    Extremity/Trunk Assessment Upper Extremity Assessment Upper Extremity Assessment: Overall WFL for tasks assessed   Lower Extremity Assessment Lower Extremity Assessment: Defer to PT evaluation        Vision       Perception     Praxis     Communication Communication Communication: Impaired Factors Affecting Communication: Difficulty expressing self;Reduced clarity of  speech   Cognition Arousal: Alert Behavior During Therapy: Impulsive Cognition: History of cognitive impairments, Cognition impaired, No family/caregiver present to determine baseline     Awareness: Intellectual awareness impaired   Attention impairment (select first level of impairment): Focused attention Executive functioning impairment (select all impairments): Initiation, Sequencing                   Following commands: Impaired Following commands impaired: Follows one step commands inconsistently      Cueing   Cueing  Techniques: Verbal cues, Tactile cues  Exercises      Shoulder Instructions       General Comments 4L    Pertinent Vitals/ Pain       Pain Assessment Pain Assessment: Faces Faces Pain Scale: Hurts little more Pain Location: back Pain Descriptors / Indicators: Aching Pain Intervention(s): Monitored during session  Home Living                                          Prior Functioning/Environment              Frequency  Min 2X/week        Progress Toward Goals  OT Goals(current goals can now be found in the care plan section)  Progress towards OT goals: Progressing toward goals  Acute Rehab OT Goals Patient Stated Goal: none stated OT Goal Formulation: Patient unable to participate in goal setting Time For Goal Achievement: 11/13/24 Potential to Achieve Goals: Fair ADL Goals Pt Will Perform Grooming: with min assist;standing Pt Will Perform Lower Body Dressing: with min assist;with caregiver independent in assisting;sit to/from stand Pt Will Transfer to Toilet: with min assist;ambulating;regular height toilet  Plan      Co-evaluation    PT/OT/SLP Co-Evaluation/Treatment: Yes Reason for Co-Treatment: Complexity of the patient's impairments (multi-system involvement);Necessary to address cognition/behavior during functional activity;For patient/therapist safety PT goals addressed during session: Mobility/safety with mobility OT goals addressed during session: ADL's and self-care      AM-PAC OT 6 Clicks Daily Activity     Outcome Measure   Help from another person eating meals?: Total Help from another person taking care of personal grooming?: A Lot Help from another person toileting, which includes using toliet, bedpan, or urinal?: A Lot Help from another person bathing (including washing, rinsing, drying)?: A Lot Help from another person to put on and taking off regular upper body clothing?: A Lot Help from another person to put on  and taking off regular lower body clothing?: A Lot 6 Click Score: 11    End of Session Equipment Utilized During Treatment: Gait belt;Rolling walker (2 wheels)  OT Visit Diagnosis: Other abnormalities of gait and mobility (R26.89);Muscle weakness (generalized) (M62.81)   Activity Tolerance     Patient Left     Nurse Communication          Time: 8950-8880 OT Time Calculation (min): 30 min  Charges: OT General Charges $OT Visit: 1 Visit OT Treatments $Self Care/Home Management : 8-22 mins  Rogers Clause, OT/L MSOT, 11/05/2024

## 2024-11-05 NOTE — Progress Notes (Signed)
 Physical Therapy Treatment Patient Details Name: Albert [name] MRN: [id number] DOB: 02/20/1946 Today's Date: 11/05/2024   History of Present Illness 78 year old male admitted with acute blood loss anemia secondary to upper GI bleed status post coil embolization of the gastroepiploic artery on 11/8 with EGD twice not show any underlying lesion or source of continued bleed. Pt intubated for hemorraghic shock then extubated to [place] on 10/27/2024. MD dx include: acute metabolic encephalopathy complicated by dementia, HFpEF, HTN, HLD, acute hypoxic respiratory failure, pulmonary edema, acute blood loss anemia, hypernatremia.    PT Comments  Pt was long sitting in bed on 4 L HFNC upon arrival. Supportive spouse and 1 son at bedside. RN and OT joined shortly afterwards. Pt requires +2 assistance (PT /OT co-treat) for safety due to current cognition and for both pt/staff safety. BUE mitts donned however removed throughout session and replaced afterwards. Pt agrees to OOB but has poor abilities to make needs known. He was able to tolerate OOB activity with constant +2 assistance. Very fatigued after minimal activity. Attempted to sit in recliner however pt unable to get comfortable and needed assistance with returning to bed. Mitts were reapplied, bed alarm set and family at bedside at conclusion of session.    If plan is discharge home, recommend the following: Two people to help with walking and/or transfers;Two people to help with bathing/dressing/bathroom;Assistance with cooking/housework;Direct supervision/assist for medications management;Direct supervision/assist for financial management;Assist for transportation;Supervision due to cognitive status     Equipment Recommendations  Other (comment) (Defer to next level of care)       Precautions / Restrictions Precautions Precautions: Fall Recall of Precautions/Restrictions: Impaired Restrictions Weight Bearing Restrictions Per Provider Order: No      Mobility  Bed Mobility Overal bed mobility: Needs Assistance Bed Mobility: Rolling, Sidelying to Sit, Supine to Sit, Sit to Supine Rolling: Mod assist, Used rails, +2 for safety/equipment Sidelying to sit: Mod assist, +2 for safety/equipment, Used rails Supine to sit: Mod assist, +2 for physical assistance, +2 for safety/equipment, HOB elevated, Used rails Sit to supine: Max assist   Transfers Overall transfer level: Needs assistance Equipment used: Rolling walker (2 wheels) Transfers: Sit to/from Stand Sit to Stand: Min assist, +2 physical assistance, +2 safety/equipment  General transfer comment: Pt is slightly impulsive. Vcs and tcs throughout for desired task. poor insight of his deficits and awareness of situation    Ambulation/Gait Ambulation/Gait assistance: Min assist, Mod assist, +2 safety/equipment Gait Distance (Feet): 30 Feet Assistive device: Rolling walker (2 wheels) Gait Pattern/deviations: Trunk flexed, Narrow base of support, Scissoring Gait velocity: decreased  General Gait Details: Pt tolerated ambulation ~ 30 ft on 2 L o2 HFNC (green) however does endorse fatigue afterwards. sao2 stable with HR elevation to >  110bpm. pt unable to get comfortable in recliner and needed to return to bed at conclusion of session.    Balance Overall balance assessment: Needs assistance Sitting-balance support: Feet supported, No upper extremity supported Sitting balance-Leahy Scale: Fair     Standing balance support: Bilateral upper extremity supported, During functional activity, Reliant on assistive device for balance Standing balance-Leahy Scale: Poor Standing balance comment: pt is at high risk of falls      Communication Communication Communication: No apparent difficulties Factors Affecting Communication: Difficulty expressing self  Cognition Arousal: Alert Behavior During Therapy: Impulsive   PT - Cognitive impairments: History of cognitive impairments, No  family/caregiver present to determine baseline      Following commands: Impaired Following commands impaired: Follows one  step commands inconsistently    Cueing Cueing Techniques: Verbal cues, Tactile cues     General Comments General comments (skin integrity, edema, etc.): 4L HFNC during gait      Pertinent Vitals/Pain Pain Assessment Pain Assessment: PAINAD Breathing: normal Negative Vocalization: occasional moan/groan, low speech, negative/disapproving quality Facial Expression: sad, frightened, frown Body Language: tense, distressed pacing, fidgeting Consolability: distracted or reassured by voice/touch PAINAD Score: 4 Pain Location: back Pain Descriptors / Indicators: Aching Pain Intervention(s): Limited activity within patient's tolerance, Monitored during session, Premedicated before session, Repositioned     PT Goals (current goals can now be found in the care plan section) Acute Rehab PT Goals Patient Stated Goal: none stated Progress towards PT goals: Progressing toward goals    Frequency    Min 1X/week       Co-evaluation   Reason for Co-Treatment: For patient/therapist safety PT goals addressed during session: Mobility/safety with mobility;Balance;Proper use of DME;Strengthening/ROM OT goals addressed during session: ADL's and self-care      AM-PAC PT 6 Clicks Mobility   Outcome Measure  Help needed turning from your back to your side while in a flat bed without using bedrails?: A Lot Help needed moving from lying on your back to sitting on the side of a flat bed without using bedrails?: A Lot Help needed moving to and from a bed to a chair (including a wheelchair)?: Total Help needed standing up from a chair using your arms (e.g., wheelchair or bedside chair)?: Total Help needed to walk in hospital room?: Total Help needed climbing 3-5 steps with a railing? : Total 6 Click Score: 8    End of Session Equipment Utilized During Treatment: Gait  belt;Oxygen Activity Tolerance: Patient limited by fatigue Patient left: in bed;with bed alarm set;with call bell/phone within reach;with nursing/sitter in room Nurse Communication: Mobility status PT Visit Diagnosis: Unsteadiness on feet (R26.81);Muscle weakness (generalized) (M62.81);Difficulty in walking, not elsewhere classified (R26.2)     Time: 8954-8866 PT Time Calculation (min) (ACUTE ONLY): 48 min  Charges:    $Therapeutic Activity: 23-37 mins PT General Charges $$ ACUTE PT VISIT: 1 Visit                     Rankin Essex PTA 11/05/24, 1:43 PM

## 2024-11-05 NOTE — Progress Notes (Addendum)
 Speech Language Pathology Treatment: Dysphagia  Patient Details Name: Albert [name] MRN: [id number] DOB: 1946-03-04 Today's Date: 11/05/2024 Time: 1600-1705 SLP Time Calculation (min) (ACUTE ONLY): 65 min  Assessment / Plan / Recommendation Clinical Impression  Pt was seen for ongoing assessment of swallowing and toleration of oral diet. Wife and 2 Sons were present in room. Pt continues to have ongoing GI issues; GI is following for ongoing management/intervention. Pt has a Baseline Dx of Severe Alzheimer's dementia with agitation, per MD note. He requires Mitts and a tele-Sitter currently d/t agitation/confusion.   OF NOTE: Pt appeared to present w/ behaviors c/w Sleep Apnea. MD was informed via secure chat.   NSG present in room giving tsps of Puree w/ Crushed Meds. Pt awakened appropriate/adequately for the po trials fed to him. No overt clinical s/s of aspiration noted; oropharyngeal swallowing appeared functional for the consistency. Spoke w/ Wife and Son re: toleration of the Dysphagia level 1 diet w/ thins- Wife stated he was doing ok w/ it but not eating much. Son endorsed pt did eat the [employer] and drink some Ensure. This was encouraged to help support nutrition but also offer Water (even flavored water ). Wife agreed.  Discussed w/ pt the current diet consistency including use of Pureed foods for ease of overall intake. Talked about reducing risks for aspiration by following general aspiration precautions to include Small sips Slowly, PINCH straw sips- small amounts. Must Sit Fully Upright w/ all oral intake. Recommended reducing distractions including Talking at meals and clearing mouth fully b/t Small bites/sips. Also discussed food consistencies and options, preparation, and use of condiments to soften foods also. Handouts given on aspiration precautions and a Dysphagia Drink Cup as support for pt/Family.   Further time during this session was spent addressing the impact of  Dementia on pt's overall functioning, including the eating/drinking and communication. Highlighted the importance of establishing as much structure/routine in his day, use of a calendar in his room for everyone to know the Family visitation schedule. Encouraged strategies to help him focus and calm- soft voice and pausing in tasks to give him Time to orient/calm.  Encouraged use of drink supplements and Magic Cup supplement in his diet when not wanting to eat meals; offer Water  often.  Answered Family's questions, offered support. Family was appreciative.   Recommend f/u by ST services at next venue of care when pt is demonstrating readiness to upgrade in his oral diet, and for further support re: pt's Dementia/behaviors. Recommend continue current Dysphagia level 1 w/ thin liquids via Pinched Straw or small, single sips; aspiration precautions. Encouraged f/u by Palliative Care services for support and guidance w/ GOC/POC acutely and at SNF- progression of disease process. Dietician also.  Precautions posted in chart, room. Family agreed. MD updated, agreed.        HPI HPI: Pt is a 78 year old male admitted with acute blood loss anemia secondary to upper GI bleed status post coil embolization of the gastroepiploic artery on 11/8 with EGD twice not show any underlying lesion or source of continued bleed. Pt intubated for hemorraghic shock then extubated to [place] on 10/27/2024.   PMH includes:  Dementia, acute metabolic encephalopathy, HFpEF, HTN, HLD, acute hypoxic respiratory failure, pulmonary edema, acute blood loss anemia, hypernatremia, GERD, malnutrition.    CXR: Stable bilateral interstitial opacities, suspicious for pulmonary edema.      SLP Plan  All goals met (f/u at next venue of care/SNF)  Recommendations  Diet recommendations: Dysphagia 1 (puree);Thin liquid Liquids provided via: Cup;Straw (PINCH straw for small sips) Medication Administration: Crushed with  puree Supervision: Staff to assist with self feeding;Full supervision/cueing for compensatory strategies Compensations: Minimize environmental distractions;Slow rate;Small sips/bites;Lingual sweep for clearance of pocketing;Follow solids with liquid Postural Changes and/or Swallow Maneuvers: Out of bed for meals;Seated upright 90 degrees;Upright 30-60 min after meal (reflux precs)                 (Palliative Care f/u at SNF; Dietician) Oral care BID;Oral care before and after PO;Staff/trained caregiver to provide oral care   Frequent or constant Supervision/Assistance Dysphagia, oropharyngeal phase (R13.12) (in setting of significant Cognitive decline/Baseline Dementia; deconditioning/illness/hospitalization; need for feeding assistance)     All goals met (f/u at next venue of care/SNF)      [name], MS, CCC-SLP Speech Language Pathologist Rehab Services; [facility] [place] [phone] (ascom) Priscila Bean  11/05/2024, 5:32 PM

## 2024-11-05 NOTE — Progress Notes (Signed)
 Progress Note   Patient: Albert [name] FMW:[id number] DOB: 1946-08-24 DOA: 10/24/2024     12 DOS: the patient was seen and examined on 11/05/2024   Brief hospital course: 78 year old male with extensive past medical history including AAA with recent endovascular stent grafting 10/21/2024, dementia, Barrett's esophagus, chronic pain syndrome, history of CVA, who presented on this admission with hematemesis.  Patient has recently been on antiplatelet therapy as well as NSAIDs.  He underwent CTA which revealed active extravasation of the gastric lumen.  Vascular surgery evaluated the patient and did not feel that the bleeding was related to prior surgery.  GI was consulted and patient underwent EGD which revealed large clotted blood in the stomach and patient was unable to finish the procedure.  He was then transferred to the ICU.  Interventional radiology was then consulted and patient underwent embolization of the mid segment of the gastroepiploic artery along the greater curvature of the stomach.  Repeat EGD showed no evidence of underlying lesion or continued bleeding.  His ICU stay was then further complicated by significant agitated delirium, acute hypoxic respiratory failure due to pulmonary edema in setting of large volume resuscitation, and necessitated NG tube feeds.  He was transferred to the [facility] service on 11/15.  Before transfer he spiked a new fever and there was concern for aspiration pneumonia, so he was started empirically on Zosyn .  SLP evaluation 11/17 allowed patient to get started on dysphagia diet but he requires significant prompting and is unlikely to sustain nutrition via oral sources alone. 11/18 patient began having significant bowel movements with some melena.  Hemoglobin trending lower.  GOC conversation with the patient's wife and sons.  At this time they like to pursue all aggressive care.  Palliative has been consulted.  11/19.  Hemoglobin little lower than yesterday at 7.8,  I will give 1 unit of packed red blood cells.  Patient does have intermittent agitation and will likely not be able to do a CT angio or bleeding scan.  Since hemoglobin only drifted down, I think we can watch conservatively at this point.  Assessment and Plan: * Acute upper GI bleed Patient had coil embolization on 11/8 with interventional radiology.  Patient had 2 EGDs unable to find the source of bleeding.  Patient had quite a bit of bowel movements on 11/18.  Hemoglobin drifted down to 7.8 on 11/19 and given a unit of blood.  Today's hemoglobin 8.4.  Conservative management.  Spoke with gastroenterology and patient's wife declined endoscopy and does not want to put him through anesthesia again.  Acute blood loss anemia Patient received 5 units of blood and 1 unit of fresh frozen plasma from 11/8 through 11/10.  Given 1 unit of blood on 11/19.  Last hemoglobin 8.4.  Severe Alzheimer's dementia with agitation (HCC) Decrease Seroquel  to nighttime dosing only.  Risperdal  as needed for agitation.  Decrease Haldol  times for agitation and hopefully will get rid of this soon.  Patient on Aricept .  Patient has a geographical information systems officer.  Electrolyte abnormality Hyponatremia and hypokalemia during the hospital course.  AKI (acute kidney injury) Resolved  Myocardial injury Secondary to GI bleed.  Holding aspirin  and Plavix .  Acute urinary retention On Flomax .  Continue Foley catheter.  Chronic diastolic CHF (congestive heart failure) (HCC) Last EF 65%.  Had volume overload during the hospitalization secondary to large volume resuscitation.  Not currently in heart failure.  Dysphagia NG tube removed on 11/8.  Patient on dysphagia diet.  Patient's wife  states that she does not want to place a PEG tube.  Aspiration pneumonia (HCC) Patient completed up to 5 days of Zosyn on 11/19.  Pressure injury Wound 10/29/24 0900 Pressure Injury Coccyx Medial;Left Deep Tissue Pressure Injury - Purple or maroon  localized area of discolored intact skin or blood-filled blister due to damage of underlying soft tissue from pressure and/or shear. (Active)     Wound 10/30/24 0800 Pressure Injury Sacrum Mid Stage 2 -  Partial thickness loss of dermis presenting as a shallow open injury with a red, pink wound bed without slough. (Active)     Wound 10/30/24 0800 Pressure Injury Buttocks Left;Lower Stage 2 -  Partial thickness loss of dermis presenting as a shallow open injury with a red, pink wound bed without slough. (Active)   Documented on 11/13 and 11/14.  Patient admitted on 11/8.  Malnutrition of moderate degree Continue dysphagia diet  Acute respiratory failure with hypoxia (HCC) Yesterday was on high flow nasal cannula 5 L.  Today down to 4 L.  Patient has a good pulse ox.  Hopefully can taper down oxygen.  AAA (abdominal aortic aneurysm) without rupture Patient had stent grafting on 11/5.  Hyperlipidemia, unspecified Holding cholesterol medication  GERD (gastroesophageal reflux disease) On twice daily Protonix      Subjective: Patient feels okay.  Son states that he was able to eat better today.  He was able to walk with physical therapy today.  Hemoglobin 8.4 today.  Physical Exam: Vitals:   11/04/24 2055 11/05/24 0500 11/05/24 0527 11/05/24 1255  BP: (!) 145/80  (!) 140/68 (!) 143/98  Pulse: 95  89 99  Resp: 16  20 18   Temp: 99.1 F (37.3 C)  98.3 F (36.8 C)   TempSrc:   Axillary   SpO2: 97%  98% 100%  Weight:  86.5 kg    Height:       Physical Exam HENT:     Head: Normocephalic.  Eyes:     General: Lids are normal.     Conjunctiva/sclera: Conjunctivae normal.  Cardiovascular:     Rate and Rhythm: Normal rate and regular rhythm.     Heart sounds: S1 normal and S2 normal. Murmur heard.     Systolic murmur is present with a grade of 2/6.  Pulmonary:     Breath sounds: Examination of the right-lower field reveals decreased breath sounds. Examination of the left-lower  field reveals decreased breath sounds. Decreased breath sounds present. No wheezing, rhonchi or rales.  Abdominal:     Palpations: Abdomen is soft.     Tenderness: There is no abdominal tenderness.  Musculoskeletal:     Right lower leg: Swelling present.     Left lower leg: Swelling present.  Skin:    General: Skin is warm.     Findings: No rash.  Neurological:     Mental Status: He is alert.     Comments: Today patient able to straight leg raise for me.     Data Reviewed: Creatinine 1.11, alkaline phosphatase 127, AST 48, ALT 108, white blood cell count 9.4, hemoglobin 8.4, platelet count 477  Family Communication: Spoke with wife at the bedside and son on the phone.  Disposition: Status is: Inpatient Remains inpatient appropriate because: Will make adjustments in medications.  Sitter will have to be discontinued prior to any discharge out to rehab.  Planned Discharge Destination: Rehab    Time spent: 28 minutes Case discussed with palliative care and vascular surgery and gastroenterology. Author: Charlie  Josette, MD 11/05/2024 2:22 PM  For on call review [email or web address].

## 2024-11-06 DIAGNOSIS — G309 Alzheimer's disease, unspecified: Secondary | ICD-10-CM | POA: Diagnosis not present

## 2024-11-06 DIAGNOSIS — D62 Acute posthemorrhagic anemia: Secondary | ICD-10-CM | POA: Diagnosis not present

## 2024-11-06 DIAGNOSIS — E878 Other disorders of electrolyte and fluid balance, not elsewhere classified: Secondary | ICD-10-CM | POA: Diagnosis not present

## 2024-11-06 DIAGNOSIS — R7989 Other specified abnormal findings of blood chemistry: Secondary | ICD-10-CM | POA: Insufficient documentation

## 2024-11-06 DIAGNOSIS — K922 Gastrointestinal hemorrhage, unspecified: Secondary | ICD-10-CM | POA: Diagnosis not present

## 2024-11-06 LAB — HEMOGLOBIN: Hemoglobin: 8.3 g/dL — ABNORMAL LOW (ref 13.0–17.0)

## 2024-11-06 MED ORDER — QUETIAPINE FUMARATE 25 MG PO TABS
25.0000 mg | ORAL_TABLET | Freq: Every day | ORAL | Status: DC
  Administered 2024-11-06: 25 mg via ORAL
  Filled 2024-11-06: qty 1

## 2024-11-06 NOTE — Plan of Care (Signed)

## 2024-11-06 NOTE — Assessment & Plan Note (Signed)
 Monitor intermittently.

## 2024-11-06 NOTE — NC FL2 (Signed)
 [place]  MEDICAID FL2 LEVEL OF CARE FORM     IDENTIFICATION  Patient Name: Albert [name] Birthdate: Apr 14, 1946 Sex: male Admission Date (Current Location): 10/24/2024  [facility] and [place] Number:  Chiropodist and Address:  [facility], [address], [place], [place] [address]      Provider Number: [id number]  Attending Physician Name and Address:  Josette Ade, MD  Relative Name and Phone Number:  Zakiah, Beckerman (Spouse)  [phone] (Mobile)    Current Level of Care: Hospital Recommended Level of Care: Skilled Nursing Facility Prior Approval Number:    Date Approved/Denied: 10/31/24 PASRR Number: [id number]  Discharge Plan: SNF    Current Diagnoses: Patient Active Problem List   Diagnosis Date Noted   Pressure injury 11/04/2024   Acute blood loss anemia 11/04/2024   Aspiration pneumonia (HCC) 11/04/2024   Dysphagia 11/04/2024   Chronic diastolic CHF (congestive heart failure) (HCC) 11/04/2024   Acute urinary retention 11/04/2024   Myocardial injury 11/04/2024   AKI (acute kidney injury) 11/04/2024   Electrolyte abnormality 11/04/2024   Malnutrition of moderate degree 10/28/2024   Upper GI bleed 10/27/2024   Acute respiratory failure with hypoxia (HCC) 10/27/2024   Toxic metabolic encephalopathy 10/27/2024   GI bleeding 10/24/2024   Acute upper GI bleed 10/24/2024   AAA (abdominal aortic aneurysm) without rupture 08/28/2024   Acute lacunar stroke (HCC) 06/23/2024   Severe Alzheimer's dementia with agitation (HCC) 06/24/2020   Toe fracture 02/22/2020   Displaced fracture of proximal phalanx of right lesser toe(s), initial encounter for closed fracture 07/30/2019   H/O stroke without residual deficits 03/03/2018   Prediabetes 08/01/2017   Anxiety 12/26/2016   Common migraine 12/26/2016   GERD (gastroesophageal reflux disease) 12/26/2016   Health care maintenance 12/26/2016   HTN, goal below 140/90 12/26/2016    Hyperlipidemia, unspecified 12/26/2016   Degenerative spondylolisthesis 03/13/2016    Orientation RESPIRATION BLADDER Height & Weight     Self  O2 (3L [place]) Indwelling catheter Weight: 81.8 kg Height:  6' 1 (185.4 cm)  BEHAVIORAL SYMPTOMS/MOOD NEUROLOGICAL BOWEL NUTRITION STATUS     (DEMENTIA) Incontinent Diet (dys1)  AMBULATORY STATUS COMMUNICATION OF NEEDS Skin   Extensive Assist Verbally PU Stage and Appropriate Care, Skin abrasions, Bruising (DTI coccyx)                       Personal Care Assistance Level of Assistance  Bathing, Feeding, Dressing Bathing Assistance: Maximum assistance Feeding assistance: Limited assistance Dressing Assistance: Maximum assistance     Functional Limitations Info  Sight, Hearing, Speech          SPECIAL CARE FACTORS FREQUENCY  PT (By licensed PT), OT (By licensed OT)     PT Frequency: 1X OT Frequency: 2X            Contractures Contractures Info: Not present    Additional Factors Info  Code Status, Allergies Code Status Info: DNR Allergies Info: Codeine           Current Medications (11/06/2024):  This is the current hospital active medication list Current Facility-Administered Medications  Medication Dose Route Frequency Provider Last Rate Last Admin   acetaminophen  (TYLENOL ) tablet 650 mg  650 mg Per Tube Q6H PRN Isadora Hose, MD   650 mg at 11/05/24 1613   Or   acetaminophen  (TYLENOL ) suppository 650 mg  650 mg Rectal Q6H PRN Isadora Hose, MD       acidophilus (RISAQUAD) capsule 2 capsule  2 capsule  Oral Daily Josette Ade, MD   2 capsule at 11/06/24 9042   ascorbic acid  (VITAMIN C ) tablet 500 mg  500 mg Oral BID Dezii, Alexandra, DO   500 mg at 11/06/24 0957   Chlorhexidine  Gluconate Cloth 2 % PADS 6 each  6 each Topical Daily Aleskerov, Fuad, MD   6 each at 11/06/24 1050   donepezil  (ARICEPT ) tablet 10 mg  10 mg Per Tube QHS Nelson, Dana G, NP   10 mg at 11/05/24 2011   feeding supplement (ENSURE PLUS  HIGH PROTEIN) liquid 237 mL  237 mL Oral TID BM Dezii, Alexandra, DO   237 mL at 11/05/24 2003   guaiFENesin  (ROBITUSSIN) 100 MG/5ML liquid 5 mL  5 mL Per Tube Q4H PRN Nelson, Dana G, NP   5 mL at 10/31/24 2057   haloperidol  lactate (HALDOL ) injection 1 mg  1 mg Intravenous Q6H PRN Josette Ade, MD   1 mg at 11/05/24 2005   ipratropium-albuterol  (DUONEB) 0.5-2.5 (3) MG/3ML nebulizer solution 3 mL  3 mL Nebulization Q4H PRN Keene, Jeremiah D, NP   3 mL at 10/28/24 1245   lidocaine  (LIDODERM ) 5 % 1 patch  1 patch Transdermal Q24H Nelson, Dana G, NP   1 patch at 11/06/24 0957   lip balm (CARMEX) ointment   Topical PRN Dezii, Alexandra, DO       loperamide  (IMODIUM ) capsule 2 mg  2 mg Oral PRN Dezii, Alexandra, DO   2 mg at 11/03/24 2023   multivitamin with minerals tablet 1 tablet  1 tablet Oral Daily Dezii, Alexandra, DO   1 tablet at 11/06/24 9042   ondansetron  (ZOFRAN ) tablet 4 mg  4 mg Oral Q6H PRN Paudel, Keshab, MD       Or   ondansetron  (ZOFRAN ) injection 4 mg  4 mg Intravenous Q6H PRN Paudel, Keshab, MD   4 mg at 10/24/24 1105   Oral care mouth rinse  15 mL Mouth Rinse PRN Aleskerov, Fuad, MD       Oral care mouth rinse  15 mL Mouth Rinse 4 times per day Josette Ade, MD   15 mL at 11/06/24 1302   Oral care mouth rinse  15 mL Mouth Rinse PRN Wieting, Richard, MD       pantoprazole  (PROTONIX ) injection 40 mg  40 mg Intravenous Q12H Aleskerov, Fuad, MD   40 mg at 11/06/24 0957   QUEtiapine  (SEROQUEL ) tablet 25 mg  25 mg Per Tube QHS Josette Ade, MD   25 mg at 11/05/24 2005   risperiDONE  (RISPERDAL ) tablet 0.5 mg  0.5 mg Oral BID PRN Josette Ade, MD   0.5 mg at 11/05/24 2208   tamsulosin  (FLOMAX ) capsule 0.4 mg  0.4 mg Oral QPC supper Mansy, Jan A, MD   0.4 mg at 11/05/24 1613     Discharge Medications: Please see discharge summary for a list of discharge medications.  Relevant Imaging Results:  Relevant Lab Results:   Additional Information [id number]  Corean ONEIDA Haddock, RN

## 2024-11-06 NOTE — Plan of Care (Signed)
  Problem: Education: Goal: Knowledge of General Education information will improve Description: Including pain rating scale, medication(s)/side effects and non-pharmacologic comfort measures Outcome: Progressing   Problem: Health Behavior/Discharge Planning: Goal: Ability to manage health-related needs will improve Outcome: Progressing   Problem: Clinical Measurements: Goal: Ability to maintain clinical measurements within normal limits will improve Outcome: Progressing Goal: Will remain free from infection Outcome: Progressing Goal: Diagnostic test results will improve Outcome: Progressing Goal: Respiratory complications will improve Outcome: Progressing Goal: Cardiovascular complication will be avoided Outcome: Progressing   Problem: Activity: Goal: Risk for activity intolerance will decrease Outcome: Progressing   Problem: Nutrition: Goal: Adequate nutrition will be maintained Outcome: Progressing   Problem: Coping: Goal: Level of anxiety will decrease Outcome: Progressing   Problem: Elimination: Goal: Will not experience complications related to bowel motility Outcome: Progressing Goal: Will not experience complications related to urinary retention Outcome: Progressing   Problem: Pain Managment: Goal: General experience of comfort will improve and/or be controlled Outcome: Progressing   Problem: Safety: Goal: Ability to remain free from injury will improve Outcome: Progressing   Problem: Skin Integrity: Goal: Risk for impaired skin integrity will decrease Outcome: Progressing   Problem: Education: Goal: Understanding of CV disease, CV risk reduction, and recovery process will improve Outcome: Progressing Goal: Individualized Educational Video(s) Outcome: Progressing   Problem: Activity: Goal: Ability to return to baseline activity level will improve Outcome: Progressing   Problem: Cardiovascular: Goal: Ability to achieve and maintain adequate  cardiovascular perfusion will improve Outcome: Progressing Goal: Vascular access site(s) Level 0-1 will be maintained Outcome: Progressing   Problem: Health Behavior/Discharge Planning: Goal: Ability to safely manage health-related needs after discharge will improve Outcome: Progressing   Problem: Safety: Goal: Non-violent Restraint(s) Outcome: Progressing

## 2024-11-06 NOTE — Progress Notes (Signed)
 Occupational Therapy Treatment Patient Details Name: Albert [name] MRN: [id number] DOB: 1946-06-17 Today's Date: 11/06/2024   History of present illness 78 year old male admitted with acute blood loss anemia secondary to upper GI bleed status post coil embolization of the gastroepiploic artery on 11/8 with EGD twice not show any underlying lesion or source of continued bleed. Pt intubated for hemorraghic shock then extubated to [place] on 10/27/2024. MD dx include: acute metabolic encephalopathy complicated by dementia, HFpEF, HTN, HLD, acute hypoxic respiratory failure, pulmonary edema, acute blood loss anemia, hypernatremia.   OT comments  Albert [name] was seen for OT treatment on this date. Upon arrival to room pt in bed with family at bed side, agreeable to tx. Pt requires MIN A exit bed. CGA sit<>Stand x4. MIN A + HHA bed<>BSC t/f, assist to initiate sitting and redirect. MOD A + RW for ADL t/f ~40 ft, assist to redirect and initiate turns. Pleasant and redirectable t/o session. Pt making good progress toward goals, will continue to follow POC. Discharge recommendation remains appropriate.        If plan is discharge home, recommend the following:  Two people to help with walking and/or transfers;Two people to help with bathing/dressing/bathroom;Help with stairs or ramp for entrance;Supervision due to cognitive status   Equipment Recommendations  Other (comment)    Recommendations for Other Services      Precautions / Restrictions Precautions Precautions: Fall Recall of Precautions/Restrictions: Impaired Restrictions Weight Bearing Restrictions Per Provider Order: No       Mobility Bed Mobility Overal bed mobility: Needs Assistance Bed Mobility: Supine to Sit, Sit to Supine     Supine to sit: Mod assist Sit to supine: Mod assist        Transfers Overall transfer level: Needs assistance Equipment used: Rolling walker (2 wheels) Transfers: Sit to/from Stand Sit to Stand:  Mod assist                 Balance Overall balance assessment: Needs assistance Sitting-balance support: Feet supported, No upper extremity supported Sitting balance-Leahy Scale: Good     Standing balance support: No upper extremity supported, During functional activity Standing balance-Leahy Scale: Poor                             ADL either performed or assessed with clinical judgement   ADL Overall ADL's : Needs assistance/impaired                                       General ADL Comments: MOD A + RW for ADL t/f ~40 ft, assist to redirect and initiate turns. MAX A don B socks in sitting     Communication Communication Communication: No apparent difficulties   Cognition Arousal: Alert Behavior During Therapy: Impulsive Cognition: History of cognitive impairments, Cognition impaired, No family/caregiver present to determine baseline                                        Cueing   Cueing Techniques: Verbal cues, Gestural cues, Tactile cues  Exercises      Shoulder Instructions       General Comments      Pertinent Vitals/ Pain       Pain Assessment Pain Assessment: PAINAD Breathing: normal Negative  Vocalization: none Facial Expression: smiling or inexpressive Body Language: relaxed Consolability: no need to console PAINAD Score: 0   Frequency  Min 2X/week        Progress Toward Goals  OT Goals(current goals can now be found in the care plan section)  Progress towards OT goals: Progressing toward goals  Acute Rehab OT Goals OT Goal Formulation: Patient unable to participate in goal setting Time For Goal Achievement: 11/13/24 Potential to Achieve Goals: Fair ADL Goals Pt Will Perform Grooming: with min assist;standing Pt Will Perform Lower Body Dressing: with min assist;with caregiver independent in assisting;sit to/from stand Pt Will Transfer to Toilet: with min assist;ambulating;regular height  toilet  Plan      Co-evaluation                 AM-PAC OT 6 Clicks Daily Activity     Outcome Measure   Help from another person eating meals?: A Lot Help from another person taking care of personal grooming?: A Lot Help from another person toileting, which includes using toliet, bedpan, or urinal?: A Lot Help from another person bathing (including washing, rinsing, drying)?: A Lot Help from another person to put on and taking off regular upper body clothing?: A Lot Help from another person to put on and taking off regular lower body clothing?: A Lot 6 Click Score: 12    End of Session Equipment Utilized During Treatment: Gait belt;Rolling walker (2 wheels)  OT Visit Diagnosis: Other abnormalities of gait and mobility (R26.89);Muscle weakness (generalized) (M62.81)   Activity Tolerance Patient tolerated treatment well   Patient Left in bed;with call bell/phone within reach;with nursing/sitter in room;with family/visitor present   Nurse Communication Mobility status        Time: 8492-8463 OT Time Calculation (min): 29 min  Charges: OT General Charges $OT Visit: 1 Visit OT Treatments $Self Care/Home Management : 8-22 mins $Therapeutic Activity: 8-22 mins  Elston Slot, M.S. OTR/L  11/06/24, 3:47 PM  ascom [phone]

## 2024-11-06 NOTE — TOC Progression Note (Signed)
 Transition of Care (TOC) - Progression Note    Patient Details  Name: Albert [name] MRN: [id number] Date of Birth: 18-Aug-1946  Transition of Care [facility]) CM/SW Contact  Corean ONEIDA Haddock, RN Phone Number: 11/06/2024, 2:30 PM  Clinical Narrative:        Per MD anticipated patient will medically be ready for dc next week.  Will need to be safety sitter free for at least 24 hours prior to [employer] with wife.  She is in agreement to bed search.  Bed search started                Expected Discharge Plan and Services                                               Social Drivers of Health (SDOH) Interventions SDOH Screenings   Food Insecurity: Patient Unable To Answer (10/24/2024)  Housing: Patient Unable To Answer (10/24/2024)  Transportation Needs: Patient Unable To Answer (10/24/2024)  Utilities: Patient Unable To Answer (10/24/2024)  Financial Resource Strain: Low Risk  (08/31/2024)   Received from [facility]  Social Connections: Unknown (10/21/2024)  Tobacco Use: High Risk (10/24/2024)    Readmission Risk Interventions    10/30/2024   12:32 PM  Readmission Risk Prevention Plan  Transportation Screening Complete  PCP or Specialist Appt within 5-7 Days Complete  Home Care Screening Complete  Medication Review (RN CM) Complete

## 2024-11-06 NOTE — Progress Notes (Addendum)
 Progress Note   Patient: Albert [name] FMW:[id number] DOB: 11-Sep-1946 DOA: 10/24/2024     13 DOS: the patient was seen and examined on 11/06/2024   Brief hospital course: 78 year old male with extensive past medical history including AAA with recent endovascular stent grafting 10/21/2024, dementia, Barrett's esophagus, chronic pain syndrome, history of CVA, who presented on this admission with hematemesis.  Patient has recently been on antiplatelet therapy as well as NSAIDs.  He underwent CTA which revealed active extravasation of the gastric lumen.  Vascular surgery evaluated the patient and did not feel that the bleeding was related to prior surgery.  GI was consulted and patient underwent EGD which revealed large clotted blood in the stomach and patient was unable to finish the procedure.  He was then transferred to the ICU.  Interventional radiology was then consulted and patient underwent embolization of the mid segment of the gastroepiploic artery along the greater curvature of the stomach.  Repeat EGD showed no evidence of underlying lesion or continued bleeding.  His ICU stay was then further complicated by significant agitated delirium, acute hypoxic respiratory failure due to pulmonary edema in setting of large volume resuscitation, and necessitated NG tube feeds.  He was transferred to the [facility] service on 11/15.  Before transfer he spiked a new fever and there was concern for aspiration pneumonia, so he was started empirically on Zosyn .  SLP evaluation 11/17 allowed patient to get started on dysphagia diet but he requires significant prompting and is unlikely to sustain nutrition via oral sources alone. 11/18 patient began having significant bowel movements with some melena.  Hemoglobin trending lower.  GOC conversation with the patient's wife and sons.  At this time they like to pursue all aggressive care.  Palliative has been consulted.  11/19.  Hemoglobin little lower than yesterday at 7.8,  I will give 1 unit of packed red blood cells.  Patient does have intermittent agitation and will likely not be able to do a CT angio or bleeding scan.  Since hemoglobin only drifted down, I think we can watch conservatively at this point. 11/20.  Hemoglobin 8.4, patient ate a little bit better, patient walked with physical therapy. 11/21.  Hemoglobin 8.3  Assessment and Plan: * Acute upper GI bleed Patient had coil embolization on 11/8 with interventional radiology.  Patient had 2 EGDs unable to find the source of bleeding.  Patient had quite a bit of bowel movements on 11/18.  Hemoglobin drifted down to 7.8 on 11/19 and given a unit of blood.  Today's hemoglobin 8.3.  Conservative management.  Spoke with gastroenterology and patient's wife declined endoscopy and does not want to put him through anesthesia again.  Acute blood loss anemia Patient received 5 units of blood and 1 unit of fresh frozen plasma from 11/8 through 11/10.  Given 1 unit of blood on 11/19.  Last hemoglobin 8.3.  Severe Alzheimer's dementia with agitation (HCC) Continue Seroquel  to nighttime dosing only.  Risperdal  as needed for agitation.  Discontinue Haldol .  Patient on Aricept .  Patient has a geographical information systems officer.  Will try to get rid of telemetry sitter over the weekend.  Elevated liver function tests Monitor intermittently.  Electrolyte abnormality Hyponatremia and hypokalemia during the hospital course.  AKI (acute kidney injury) Resolved  Myocardial injury Secondary to GI bleed.  Holding aspirin  and Plavix .  Acute urinary retention On Flomax .  Continue Foley catheter.  Chronic diastolic CHF (congestive heart failure) (HCC) Last EF 65%.  Had volume overload during the  hospitalization secondary to large volume resuscitation.  Not currently in heart failure.  Dysphagia NG tube removed on 11/8.  Patient on dysphagia diet.  Patient's wife states that she does not want to place a PEG tube.  Aspiration pneumonia  (HCC) Patient completed up to 5 days of Zosyn  on 11/19.  Pressure injury Wound 10/29/24 0900 Pressure Injury Coccyx Medial;Left Deep Tissue Pressure Injury - Purple or maroon localized area of discolored intact skin or blood-filled blister due to damage of underlying soft tissue from pressure and/or shear. (Active)     Wound 10/30/24 0800 Pressure Injury Sacrum Mid Stage 2 -  Partial thickness loss of dermis presenting as a shallow open injury with a red, pink wound bed without slough. (Active)     Wound 10/30/24 0800 Pressure Injury Buttocks Left;Lower Stage 2 -  Partial thickness loss of dermis presenting as a shallow open injury with a red, pink wound bed without slough. (Active)   Documented on 11/13 and 11/14.  Patient admitted on 11/8.  Malnutrition of moderate degree Continue dysphagia diet  Acute respiratory failure with hypoxia (HCC) A few days ago was on high flow nasal cannula 5 L.  Today down to 3 L.  Patient has a good pulse ox.  Check pulse ox on room air  AAA (abdominal aortic aneurysm) without rupture Patient had stent grafting on 11/5.  Hyperlipidemia, unspecified Holding cholesterol medication  GERD (gastroesophageal reflux disease) On twice daily Protonix        Subjective: Patient answers a few yes/no questions.  Able to straight leg raise.  Not the best historian.  Tried to explain to the patient to press the call bell if he needs the nurse.  Physical Exam: Vitals:   11/05/24 1945 11/06/24 0500 11/06/24 0523 11/06/24 0759  BP: 133/76  (!) 143/78 139/72  Pulse: 93  95 97  Resp: 19  20 14   Temp: 98.3 F (36.8 C)  99.4 F (37.4 C) 97.7 F (36.5 C)  TempSrc: Oral  Axillary Oral  SpO2: 98%  99% 100%  Weight:  81.8 kg    Height:       Physical Exam HENT:     Head: Normocephalic.  Eyes:     General: Lids are normal.     Conjunctiva/sclera: Conjunctivae normal.  Cardiovascular:     Rate and Rhythm: Normal rate and regular rhythm.     Heart sounds:  S1 normal and S2 normal. Murmur heard.     Systolic murmur is present with a grade of 2/6.  Pulmonary:     Breath sounds: Examination of the right-lower field reveals decreased breath sounds. Examination of the left-lower field reveals decreased breath sounds. Decreased breath sounds present. No wheezing, rhonchi or rales.  Abdominal:     Palpations: Abdomen is soft.     Tenderness: There is no abdominal tenderness.  Musculoskeletal:     Right lower leg: Swelling present.     Left lower leg: Swelling present.  Skin:    General: Skin is warm.     Findings: No rash.  Neurological:     Mental Status: He is alert.     Comments: Today patient able to straight leg raise for me.     Data Reviewed: ALT 48 AST 108.,  Hemoglobin 8.3.  Family Communication: spoke with wife on the phone  Disposition: Status is: Inpatient Remains inpatient appropriate because:   Planned Discharge Destination: Rehab    Time spent: 28 minutes  Author: Charlie Patterson, MD 11/06/2024 2:58 PM  For on call review [email or web address].

## 2024-11-07 DIAGNOSIS — K922 Gastrointestinal hemorrhage, unspecified: Secondary | ICD-10-CM | POA: Diagnosis not present

## 2024-11-07 DIAGNOSIS — R7989 Other specified abnormal findings of blood chemistry: Secondary | ICD-10-CM | POA: Diagnosis not present

## 2024-11-07 DIAGNOSIS — D62 Acute posthemorrhagic anemia: Secondary | ICD-10-CM | POA: Diagnosis not present

## 2024-11-07 DIAGNOSIS — G309 Alzheimer's disease, unspecified: Secondary | ICD-10-CM | POA: Diagnosis not present

## 2024-11-07 MED ORDER — PANTOPRAZOLE SODIUM 40 MG PO TBEC
40.0000 mg | DELAYED_RELEASE_TABLET | Freq: Two times a day (BID) | ORAL | Status: DC
  Administered 2024-11-07 – 2024-11-10 (×6): 40 mg via ORAL
  Filled 2024-11-07 (×6): qty 1

## 2024-11-07 MED ORDER — ACETAMINOPHEN 650 MG RE SUPP: 650.0000 mg | Freq: Four times a day (QID) | RECTAL | Status: DC | PRN

## 2024-11-07 MED ORDER — GUAIFENESIN 100 MG/5ML PO LIQD: 5.0000 mL | ORAL | Status: DC | PRN

## 2024-11-07 MED ORDER — QUETIAPINE FUMARATE 25 MG PO TABS
50.0000 mg | ORAL_TABLET | Freq: Every day | ORAL | Status: DC
  Administered 2024-11-07 – 2024-11-09 (×3): 50 mg via ORAL
  Filled 2024-11-07 (×3): qty 2

## 2024-11-07 MED ORDER — DONEPEZIL HCL 5 MG PO TABS
10.0000 mg | ORAL_TABLET | Freq: Every day | ORAL | Status: DC
  Administered 2024-11-08 – 2024-11-09 (×2): 10 mg via ORAL
  Filled 2024-11-07 (×2): qty 2

## 2024-11-07 MED ORDER — ACETAMINOPHEN 325 MG PO TABS
650.0000 mg | ORAL_TABLET | Freq: Four times a day (QID) | ORAL | Status: DC | PRN
  Administered 2024-11-08 – 2024-11-10 (×3): 650 mg via ORAL
  Filled 2024-11-07 (×3): qty 2

## 2024-11-07 NOTE — Assessment & Plan Note (Signed)
No blood thinners 

## 2024-11-07 NOTE — TOC Progression Note (Signed)
 Transition of Care (TOC) - Progression Note    Patient Details  Name: Albert [name] MRN: [id number] Date of Birth: 23-Jul-1946  Transition of Care [facility]) CM/SW Contact  Karder Goodin E Braylinn Gulden, LCSW Phone Number: 11/07/2024, 9:45 AM  Clinical Narrative:    Awaiting bed offers.                     Expected Discharge Plan and Services                                               Social Drivers of Health (SDOH) Interventions SDOH Screenings   Food Insecurity: Patient Unable To Answer (10/24/2024)  Housing: Patient Unable To Answer (10/24/2024)  Transportation Needs: Patient Unable To Answer (10/24/2024)  Utilities: Patient Unable To Answer (10/24/2024)  Financial Resource Strain: Low Risk  (08/31/2024)   Received from [facility]  Social Connections: Unknown (10/21/2024)  Tobacco Use: High Risk (10/24/2024)    Readmission Risk Interventions    10/30/2024   12:32 PM  Readmission Risk Prevention Plan  Transportation Screening Complete  PCP or Specialist Appt within 5-7 Days Complete  Home Care Screening Complete  Medication Review (RN CM) Complete

## 2024-11-07 NOTE — Progress Notes (Signed)
 Progress Note   Patient: Albert [name] FMW:[id number] DOB: 1946-11-18 DOA: 10/24/2024     14 DOS: the patient was seen and examined on 11/07/2024   Brief hospital course: 78 year old male with extensive past medical history including AAA with recent endovascular stent grafting 10/21/2024, dementia, Barrett's esophagus, chronic pain syndrome, history of CVA, who presented on this admission with hematemesis.  Patient has recently been on antiplatelet therapy as well as NSAIDs.  He underwent CTA which revealed active extravasation of the gastric lumen.  Vascular surgery evaluated the patient and did not feel that the bleeding was related to prior surgery.  GI was consulted and patient underwent EGD which revealed large clotted blood in the stomach and patient was unable to finish the procedure.  He was then transferred to the ICU.  Interventional radiology was then consulted and patient underwent embolization of the mid segment of the gastroepiploic artery along the greater curvature of the stomach.  Repeat EGD showed no evidence of underlying lesion or continued bleeding.  His ICU stay was then further complicated by significant agitated delirium, acute hypoxic respiratory failure due to pulmonary edema in setting of large volume resuscitation, and necessitated NG tube feeds.  He was transferred to the [facility] service on 11/15.  Before transfer he spiked a new fever and there was concern for aspiration pneumonia, so he was started empirically on Zosyn .  SLP evaluation 11/17 allowed patient to get started on dysphagia diet but he requires significant prompting and is unlikely to sustain nutrition via oral sources alone. 11/18 patient began having significant bowel movements with some melena.  Hemoglobin trending lower.  GOC conversation with the patient's wife and sons.  At this time they like to pursue all aggressive care.  Palliative has been consulted.  11/19.  Hemoglobin little lower than yesterday at 7.8,  I will give 1 unit of packed red blood cells.  Patient does have intermittent agitation and will likely not be able to do a CT angio or bleeding scan.  Since hemoglobin only drifted down, I think we can watch conservatively at this point. 11/20.  Hemoglobin 8.4, patient ate a little bit better, patient walked with physical therapy. 11/21.  Hemoglobin 8.3 11/22.  Patient off oxygen this morning.  Assessment and Plan: * Acute upper GI bleed Patient had coil embolization on 11/8 with interventional radiology.  Patient had 2 EGDs unable to find the source of bleeding.  Patient had quite a bit of bowel movements on 11/18.  Hemoglobin drifted down to 7.8 on 11/19 and given a unit of blood.  Last hemoglobin 8.3.  Conservative management.  Spoke with gastroenterology and patient's wife declined endoscopy and does not want to put him through anesthesia again.  Switch Protonix  over to oral twice daily.  Check hemoglobin tomorrow.  Acute blood loss anemia Patient received 5 units of blood and 1 unit of fresh frozen plasma from 11/8 through 11/10.  Given 1 unit of blood on 11/19.  Last hemoglobin 8.3.  Severe Alzheimer's dementia with agitation (HCC) Increase Seroquel  at nighttime to 50 mg.  Risperdal  as needed for agitation.  Discontinued Haldol .  Patient on Aricept .  Patient has a comptroller.  Will hopefully try to get rid of sitter in the next day or so.  Elevated liver function tests Monitor intermittently.  Electrolyte abnormality Hyponatremia and hypokalemia during the hospital course.  AKI (acute kidney injury) Resolved  Myocardial injury Secondary to GI bleed.  Holding aspirin  and Plavix .  Acute urinary retention On  Flomax .  Will try to remove Foley catheter this evening around midnight.  Chronic diastolic CHF (congestive heart failure) (HCC) Last EF 65%.  Had volume overload during the hospitalization secondary to large volume resuscitation.  Not currently in heart failure.  Dysphagia NG  tube removed on 11/8.  Patient on dysphagia diet.  Patient's wife states that she does not want to place a PEG tube.  Aspiration pneumonia (HCC) Patient completed up to 5 days of Zosyn  on 11/19.  Pressure injury Wound 10/29/24 0900 Pressure Injury Coccyx Medial;Left Deep Tissue Pressure Injury - Purple or maroon localized area of discolored intact skin or blood-filled blister due to damage of underlying soft tissue from pressure and/or shear. (Active)     Wound 10/30/24 0800 Pressure Injury Sacrum Mid Stage 2 -  Partial thickness loss of dermis presenting as a shallow open injury with a red, pink wound bed without slough. (Active)     Wound 10/30/24 0800 Pressure Injury Buttocks Left;Lower Stage 2 -  Partial thickness loss of dermis presenting as a shallow open injury with a red, pink wound bed without slough. (Active)   Documented on 11/13 and 11/14.  Patient admitted on 11/8.  Malnutrition of moderate degree Continue dysphagia diet  Acute respiratory failure with hypoxia (HCC) A few days ago was on high flow nasal cannula 5 L.  Today off oxygen.  AAA (abdominal aortic aneurysm) without rupture Patient had stent grafting on 11/5.  Hyperlipidemia, unspecified Holding cholesterol medication  H/O stroke without residual deficits No blood thinners.  GERD (gastroesophageal reflux disease) On twice daily Protonix         Subjective: Patient answers a few simple questions.  Able to lift his legs up off the bed.  Off oxygen this morning.  [place] off.  Physical Exam: Vitals:   11/06/24 1920 11/07/24 0407 11/07/24 0410 11/07/24 0833  BP: 138/81 (!) 159/75  (!) 153/74  Pulse: (!) 103 95  97  Resp: 16 16  17   Temp: 99.2 F (37.3 C) 99 F (37.2 C)  (!) 97.5 F (36.4 C)  TempSrc: Axillary Axillary    SpO2: 100% 97%  92%  Weight:   83.7 kg   Height:       Physical Exam HENT:     Head: Normocephalic.  Eyes:     General: Lids are normal.     Conjunctiva/sclera: Conjunctivae  normal.  Cardiovascular:     Rate and Rhythm: Normal rate and regular rhythm.     Heart sounds: S1 normal and S2 normal. Murmur heard.     Systolic murmur is present with a grade of 2/6.  Pulmonary:     Breath sounds: Examination of the right-lower field reveals decreased breath sounds. Examination of the left-lower field reveals decreased breath sounds. Decreased breath sounds present. No wheezing, rhonchi or rales.  Abdominal:     Palpations: Abdomen is soft.     Tenderness: There is no abdominal tenderness.  Musculoskeletal:     Right lower leg: Swelling present.     Left lower leg: Swelling present.  Skin:    General: Skin is warm.     Findings: No rash.  Neurological:     Mental Status: He is alert.     Comments: Today patient able to straight leg raise for me.     Data Reviewed: Creatinine 1.11, AST 48, ALT 108, hemoglobin 8.3 Family Communication: Spoke with wife at the bedside and son on the phone  Disposition: Status is: Inpatient Remains inpatient appropriate because:  Will try to get rid of Foley catheter around midnight tonight.  Will need to get rid of sitter prior to disposition out to rehab.  Planned Discharge Destination: Rehab    Time spent: 29 minutes  Author: Charlie Patterson, MD 11/07/2024 3:37 PM  For on call review [email or web address].

## 2024-11-07 NOTE — Progress Notes (Signed)
 Physical Therapy Treatment Patient Details Name: Albert [name] MRN: [id number] DOB: October 31, 1946 Today's Date: 11/07/2024   History of Present Illness 78 year old male admitted with acute blood loss anemia secondary to upper GI bleed status post coil embolization of the gastroepiploic artery on 11/8 with EGD twice not show any underlying lesion or source of continued bleed. Pt intubated for hemorraghic shock then extubated to [place] on 10/27/2024. MD dx include: acute metabolic encephalopathy complicated by dementia, HFpEF, HTN, HLD, acute hypoxic respiratory failure, pulmonary edema, acute blood loss anemia, hypernatremia.        If plan is discharge home, recommend the following: A little help with walking and/or transfers;A lot of help with bathing/dressing/bathroom;Assistance with cooking/housework;Assistance with feeding;Direct supervision/assist for medications management;Direct supervision/assist for financial management;Assist for transportation;Help with stairs or ramp for entrance;Supervision due to cognitive status     Equipment Recommendations  Other (comment) (Defer to next level of care)       Precautions / Restrictions Precautions Precautions: Fall Recall of Precautions/Restrictions: Impaired Restrictions Weight Bearing Restrictions Per Provider Order: No     Mobility  Bed Mobility Overal bed mobility: Needs Assistance Bed Mobility: Supine to Sit, Sit to Supine  Supine to sit: Min assist, Mod assist Sit to supine: Min assist, Mod assist General bed mobility comments: pt strugggles to initiate movements at times however is able to perform with min assist when not resistive to desired task    Transfers Overall transfer level: Needs assistance Equipment used: Rolling walker (2 wheels) Transfers: Sit to/from Stand Sit to Stand: Min assist, +2 safety/equipment  General transfer comment: cognition continues to greatly impact pt's level of assistance required     Ambulation/Gait Ambulation/Gait assistance: Contact guard assist, Mod assist Gait Distance (Feet): 200 Feet Assistive device: Rolling walker (2 wheels) Gait Pattern/deviations: Step-through pattern Gait velocity: decreased  General Gait Details: Pt ambulated ~ 200 ft with mostly CGA however with different floor color, pt attempted to step over (lifted legs extremely high/ flexed hips and knees as if to be stepping over an object) which required mod assist to prevent fall.    Balance Overall balance assessment: Needs assistance Sitting-balance support: Feet supported Sitting balance-Leahy Scale: Good     Standing balance support: Bilateral upper extremity supported, During functional activity, Reliant on assistive device for balance Standing balance-Leahy Scale: Poor Standing balance comment: pt's cognition and awareness make pt at high risk of falls       Communication Communication Communication: No apparent difficulties  Cognition Arousal: Alert Behavior During Therapy: Flat affect, Impulsive   PT - Cognitive impairments: History of cognitive impairments      PT - Cognition Comments: Extensive hx of dementia. worse since admission versus pre-admission. Agitated at times Following commands: Impaired Following commands impaired: Only follows one step commands consistently, Follows one step commands inconsistently    Cueing Cueing Techniques: Verbal cues, Tactile cues     General Comments General comments (skin integrity, edema, etc.): Pt seemed to tolerate gait well however remains limited by fatigue and cognition. spouse continues to endorse poor intake.      Pertinent Vitals/Pain Pain Assessment Pain Assessment: PAINAD Breathing: normal Negative Vocalization: occasional moan/groan, low speech, negative/disapproving quality Facial Expression: smiling or inexpressive Body Language: relaxed Consolability: distracted or reassured by voice/touch PAINAD Score: 2 Pain  Location: back Pain Intervention(s): Limited activity within patient's tolerance, Monitored during session, Premedicated before session, Repositioned     PT Goals (current goals can now be found in the care plan section) Acute  Rehab PT Goals Patient Stated Goal: none stated Progress towards PT goals: Progressing toward goals    Frequency    Min 1X/week           Co-evaluation     PT goals addressed during session: Mobility/safety with mobility;Proper use of DME;Balance;Strengthening/ROM        AM-PAC PT 6 Clicks Mobility   Outcome Measure  Help needed turning from your back to your side while in a flat bed without using bedrails?: A Lot Help needed moving from lying on your back to sitting on the side of a flat bed without using bedrails?: A Lot Help needed moving to and from a bed to a chair (including a wheelchair)?: A Lot Help needed standing up from a chair using your arms (e.g., wheelchair or bedside chair)?: A Lot Help needed to walk in hospital room?: A Lot Help needed climbing 3-5 steps with a railing? : A Lot 6 Click Score: 12    End of Session Equipment Utilized During Treatment: Gait belt;Oxygen Activity Tolerance: Patient tolerated treatment well;Patient limited by fatigue Patient left: in bed;with bed alarm set;with call bell/phone within reach;with nursing/sitter in room Nurse Communication: Mobility status PT Visit Diagnosis: Unsteadiness on feet (R26.81);Muscle weakness (generalized) (M62.81);Difficulty in walking, not elsewhere classified (R26.2)     Time: 1424-1440 PT Time Calculation (min) (ACUTE ONLY): 16 min  Charges:    $Gait Training: 8-22 mins PT General Charges $$ ACUTE PT VISIT: 1 Visit                    Rankin Essex PTA 11/07/24, 3:26 PM

## 2024-11-07 NOTE — Progress Notes (Signed)
 PT Cancellation Note  Patient Details Name: Albert [name] MRN: [id number] DOB: [birth year]   Cancelled Treatment:     PT attempt x 2. First attempt, pt was very lethargic with family requesting time for him to rest. Per discussion, pt had long night with agitation. Author issued posey belt for future fidgeting. Sitter in room. 2nd attempt, RN staff cleaning pt after having BM. Acute PT will continue to follow and progress as able per current POC.    Rankin KATHEE Essex 11/07/2024, 12:13 PM

## 2024-11-08 DIAGNOSIS — R338 Other retention of urine: Secondary | ICD-10-CM | POA: Diagnosis not present

## 2024-11-08 DIAGNOSIS — K922 Gastrointestinal hemorrhage, unspecified: Secondary | ICD-10-CM | POA: Diagnosis not present

## 2024-11-08 DIAGNOSIS — D62 Acute posthemorrhagic anemia: Secondary | ICD-10-CM | POA: Diagnosis not present

## 2024-11-08 DIAGNOSIS — L8990 Pressure ulcer of unspecified site, unspecified stage: Secondary | ICD-10-CM

## 2024-11-08 DIAGNOSIS — G309 Alzheimer's disease, unspecified: Secondary | ICD-10-CM | POA: Diagnosis not present

## 2024-11-08 LAB — COMPREHENSIVE METABOLIC PANEL WITH GFR
ALT: 69 U/L — ABNORMAL HIGH (ref 0–44)
AST: 34 U/L (ref 15–41)
Albumin: 3 g/dL — ABNORMAL LOW (ref 3.5–5.0)
Alkaline Phosphatase: 107 U/L (ref 38–126)
Anion gap: 12 (ref 5–15)
BUN: 20 mg/dL (ref 8–23)
CO2: 24 mmol/L (ref 22–32)
Calcium: 8.7 mg/dL — ABNORMAL LOW (ref 8.9–10.3)
Chloride: 108 mmol/L (ref 98–111)
Creatinine, Ser: 1 mg/dL (ref 0.61–1.24)
GFR, Estimated: >60 mL/min (ref >60)
Glucose, Bld: 104 mg/dL — ABNORMAL HIGH (ref 70–99)
Potassium: 3.5 mmol/L (ref 3.5–5.1)
Sodium: 143 mmol/L (ref 135–145)
Total Bilirubin: 0.3 mg/dL (ref 0.0–1.2)
Total Protein: 5.8 g/dL — ABNORMAL LOW (ref 6.5–8.1)

## 2024-11-08 LAB — CBC
HCT: 26.5 % — ABNORMAL LOW (ref 39.0–52.0)
Hemoglobin: 8.3 g/dL — ABNORMAL LOW (ref 13.0–17.0)
MCH: 27.7 pg (ref 26.0–34.0)
MCHC: 31.3 g/dL (ref 30.0–36.0)
MCV: 88.3 fL (ref 80.0–100.0)
Platelets: 549 K/uL — ABNORMAL HIGH (ref 150–400)
RBC: 3 MIL/uL — ABNORMAL LOW (ref 4.22–5.81)
RDW: 14.5 % (ref 11.5–15.5)
WBC: 8.3 K/uL (ref 4.0–10.5)
nRBC: 0 % (ref 0.0–0.2)

## 2024-11-08 LAB — TYPE AND SCREEN
ABO/RH(D): A POS
Antibody Screen: NEGATIVE

## 2024-11-08 MED ORDER — POTASSIUM CHLORIDE 20 MEQ PO PACK
40.0000 meq | PACK | Freq: Once | ORAL | Status: AC
  Administered 2024-11-08: 40 meq via ORAL
  Filled 2024-11-08: qty 2

## 2024-11-08 MED ORDER — SODIUM CHLORIDE 0.9 % IV SOLN
300.0000 mg | Freq: Once | INTRAVENOUS | Status: AC
  Administered 2024-11-08: 300 mg via INTRAVENOUS
  Filled 2024-11-08: qty 300

## 2024-11-08 NOTE — Progress Notes (Signed)
 Progress Note   Patient: Albert [name] FMW:[id number] DOB: 1946/11/07 DOA: 10/24/2024     15 DOS: the patient was seen and examined on 11/08/2024   Brief hospital course: 78 year old male with extensive past medical history including AAA with recent endovascular stent grafting 10/21/2024, dementia, Barrett's esophagus, chronic pain syndrome, history of CVA, who presented on this admission with hematemesis.  Patient has recently been on antiplatelet therapy as well as NSAIDs.  He underwent CTA which revealed active extravasation of the gastric lumen.  Vascular surgery evaluated the patient and did not feel that the bleeding was related to prior surgery.  GI was consulted and patient underwent EGD which revealed large clotted blood in the stomach and patient was unable to finish the procedure.  He was then transferred to the ICU.  Interventional radiology was then consulted and patient underwent embolization of the mid segment of the gastroepiploic artery along the greater curvature of the stomach.  Repeat EGD showed no evidence of underlying lesion or continued bleeding.  His ICU stay was then further complicated by significant agitated delirium, acute hypoxic respiratory failure due to pulmonary edema in setting of large volume resuscitation, and necessitated NG tube feeds.  He was transferred to the [facility] service on 11/15.  Before transfer he spiked a new fever and there was concern for aspiration pneumonia, so he was started empirically on Zosyn .  SLP evaluation 11/17 allowed patient to get started on dysphagia diet but he requires significant prompting and is unlikely to sustain nutrition via oral sources alone. 11/18 patient began having significant bowel movements with some melena.  Hemoglobin trending lower.  GOC conversation with the patient's wife and sons.  At this time they like to pursue all aggressive care.  Palliative has been consulted.  11/19.  Hemoglobin little lower than yesterday at 7.8,  I will give 1 unit of packed red blood cells.  Patient does have intermittent agitation and will likely not be able to do a CT angio or bleeding scan.  Since hemoglobin only drifted down, I think we can watch conservatively at this point. 11/20.  Hemoglobin 8.4, patient ate a little bit better, patient walked with physical therapy. 11/21.  Hemoglobin 8.3 11/22.  Patient off oxygen this morning. 11/23.  Foley catheter removed last night and patient urinating.  Creatinine down to 1.0 and hemoglobin 8.3.  Will give IV iron .  Patient doing a lot better with his mental status than earlier in the hospitalization.  Assessment and Plan: * Acute upper GI bleed Patient had coil embolization on 11/8 with interventional radiology.  Patient had 2 EGDs unable to find the source of bleeding.  Patient had quite a bit of bowel movements on 11/18.  Hemoglobin drifted down to 7.8 on 11/19 and given a unit of blood.  Last hemoglobin 8.3.  Conservative management.  Spoke with gastroenterology and patient's wife declined endoscopy and does not want to put him through anesthesia again.  Switch Protonix  over to oral twice daily.  Hemoglobin stable at 8.3.  Will give IV iron  today.  Acute blood loss anemia Patient received 5 units of blood and 1 unit of fresh frozen plasma from 11/8 through 11/10.  Given 1 unit of blood on 11/19.  Last hemoglobin 8.3.  Severe Alzheimer's dementia with agitation (HCC) Increased Seroquel  at nighttime to 50 mg.  Risperdal  as needed for agitation.  Discontinued Haldol .  Patient on Aricept .  Patient has a comptroller.  Since I do not have a rehab bed offer  yet and I will keep the sitter going.  Once I get a rehab bed offer I will get rid of the sitter.  Mental status much improved from earlier in the week when I saw him.  Patient able to follow commands and stand up for me.  Elevated liver function tests AST normalized and ALT down to 69.  Electrolyte abnormality Hyponatremia and hypokalemia  during the hospital course.  AKI (acute kidney injury) Resolved.  Last creatinine 1.0.  Myocardial injury Secondary to GI bleed.  Holding aspirin  and Plavix .  Acute urinary retention On Flomax .  Foley catheter removed on 11/22 near midnight.  Patient urinating today.  Chronic diastolic CHF (congestive heart failure) (HCC) Last EF 65%.  Had volume overload during the hospitalization secondary to large volume resuscitation.  Not currently in heart failure.  Dysphagia NG tube removed on 11/8.  Patient on dysphagia diet.  Patient's wife states that she does not want to place a PEG tube.  Aspiration pneumonia (HCC) Patient completed up to 5 days of Zosyn  on 11/19.  Pressure injury Wound 10/29/24 0900 Pressure Injury Coccyx Medial;Left Deep Tissue Pressure Injury - Purple or maroon localized area of discolored intact skin or blood-filled blister due to damage of underlying soft tissue from pressure and/or shear. (Active)     Wound 10/30/24 0800 Pressure Injury Sacrum Mid Stage 2 -  Partial thickness loss of dermis presenting as a shallow open injury with a red, pink wound bed without slough. (Active)     Wound 10/30/24 0800 Pressure Injury Buttocks Left;Lower Stage 2 -  Partial thickness loss of dermis presenting as a shallow open injury with a red, pink wound bed without slough. (Active)   Documented on 11/13 and 11/14.  Patient admitted on 11/8.  Malnutrition of moderate degree Continue dysphagia diet  Acute respiratory failure with hypoxia (HCC) Previously on high flow nasal cannula 5 L.  Today off oxygen.  AAA (abdominal aortic aneurysm) without rupture Patient had stent grafting on 11/5.  Hyperlipidemia, unspecified Holding cholesterol medication  H/O stroke without residual deficits No blood thinners.  GERD (gastroesophageal reflux disease) On twice daily Protonix         Subjective: Patient seen and he was sitting in the chair.  He was able to stand up for me and  move his legs to command.  Doing a lot better than earlier in the hospitalization.  Urinated after Foley catheter removed.  Answer some simple questions.  Admitted with severe GI bleed.  Physical Exam: Vitals:   11/07/24 2000 11/08/24 0358 11/08/24 0500 11/08/24 0805  BP: (!) 158/84 114/78  121/75  Pulse: 95 92  78  Resp: 17 17  20   Temp: 97.8 F (36.6 C) 98.7 F (37.1 C)  (!) 97.5 F (36.4 C)  TempSrc:    Axillary  SpO2: 98% 97%  96%  Weight:   83.7 kg   Height:       Physical Exam HENT:     Head: Normocephalic.  Eyes:     General: Lids are normal.     Conjunctiva/sclera: Conjunctivae normal.  Cardiovascular:     Rate and Rhythm: Normal rate and regular rhythm.     Heart sounds: S1 normal and S2 normal. Murmur heard.     Systolic murmur is present with a grade of 2/6.  Pulmonary:     Breath sounds: Examination of the right-lower field reveals decreased breath sounds. Examination of the left-lower field reveals decreased breath sounds. Decreased breath sounds present. No wheezing, rhonchi  or rales.  Abdominal:     Palpations: Abdomen is soft.     Tenderness: There is no abdominal tenderness.  Musculoskeletal:     Right lower leg: Swelling present.     Left lower leg: Swelling present.  Skin:    General: Skin is warm.     Findings: No rash.  Neurological:     Mental Status: He is alert.     Comments: Today, patient able to stand up to command.  Prior to that he was able to move his legs to command.     Data Reviewed: Creatinine 1.0, AST 34, ALT 69, electrolytes normal range, hemoglobin 8.3, platelet count 549 Family Communication: Spoke with sister and wife at the bedside.  Spoke with son on the phone  Disposition: Status is: Inpatient Remains inpatient appropriate because: Will get sitter once we have a rehab bed offer.  Still will need insurance authorization.  Planned Discharge Destination: Rehab    Time spent: 28 minutes  Author: Charlie Patterson,  MD 11/08/2024 3:13 PM  For on call review [email or web address].

## 2024-11-09 NOTE — TOC Progression Note (Signed)
 Transition of Care (TOC) - Progression Note    Patient Details  Name: Albert [name] MRN: [id number] Date of Birth: 1945/12/22  Transition of [facility]) CM/SW Contact  Corean ONEIDA Haddock, RN Phone Number: 11/09/2024, 3:08 PM  Clinical Narrative:      Met with wife and son mark at bedside.  Bed offers presented.  CMS Medicare.gov Compare Post Acute Care list reviewed with wife.  Wife selects [facility].  Accepted in HUB,  notified Cynthia at [facility].  Beverly with [facility] Care Management to start auth.   MD to [employer] sitter to trail with out                    Expected Discharge Plan and Services                                               Social Drivers of Health (SDOH) Interventions SDOH Screenings   Food Insecurity: Patient Unable To Answer (10/24/2024)  Housing: Patient Unable To Answer (10/24/2024)  Transportation Needs: Patient Unable To Answer (10/24/2024)  Utilities: Patient Unable To Answer (10/24/2024)  Financial Resource Strain: Low Risk  (08/31/2024)   Received from [facility]  Social Connections: Unknown (10/21/2024)  Tobacco Use: High Risk (10/24/2024)    Readmission Risk Interventions    10/30/2024   12:32 PM  Readmission Risk Prevention Plan  Transportation Screening Complete  PCP or Specialist Appt within 5-7 Days Complete  Home Care Screening Complete  Medication Review (RN CM) Complete

## 2024-11-09 NOTE — Progress Notes (Signed)
 Mobility Specialist - Progress Note   11/09/24 1722  Mobility  Activity Ambulated with assistance  Level of Assistance Minimal assist, patient does 75% or more  Assistive Device Front wheel walker  Distance Ambulated (ft) 24 ft  Activity Response Tolerated well  Mobility visit 1 Mobility  Mobility Specialist Start Time (ACUTE ONLY) 1704  Mobility Specialist Stop Time (ACUTE ONLY) 1718  Mobility Specialist Time Calculation (min) (ACUTE ONLY) 14 min   Pt supine upon entry, utilizing RA. Pt amb to/from the bathroom MinA requiring ModA to navigate RW. Pt left seated in the recliner with alarm set and needs within reach.  America Silvan Mobility Specialist 11/09/24 5:28 PM

## 2024-11-09 NOTE — Progress Notes (Signed)
 Occupational Therapy Treatment Patient Details Name: Albert [name] MRN: [id number] DOB: [birth year] Today's Date: 11/09/2024   History of present illness 78 year old male admitted with acute blood loss anemia secondary to upper GI bleed status post coil embolization of the gastroepiploic artery on 11/8 with EGD twice not show any underlying lesion or source of continued bleed. Pt intubated for hemorraghic shock then extubated to [facility] on 10/27/2024. MD dx include: acute metabolic encephalopathy complicated by dementia, HFpEF, HTN, HLD, acute hypoxic respiratory failure, pulmonary edema, acute blood loss anemia, hypernatremia.   OT comments  Albert [name] was seen for OT treatment on this date. Upon arrival to room pt seated EOB, agreeable to tx. Pt requires MIN A + HHA for toilet t/f with significant furniture walking noted. MIN A + RW for ADL t/f, assist for RW mgmt (on turns pt with RW on 2 legs). MOD A don underwear standing, SBA doff sitting. Pt making good progress toward goals, will continue to follow POC. Discharge recommendation remains appropriate.       If plan is discharge home, recommend the following:  Two people to help with walking and/or transfers;Two people to help with bathing/dressing/bathroom;Help with stairs or ramp for entrance;Supervision due to cognitive status   Equipment Recommendations  Other (comment)    Recommendations for Other Services      Precautions / Restrictions Precautions Precautions: Fall Recall of Precautions/Restrictions: Impaired Restrictions Weight Bearing Restrictions Per Provider Order: No       Mobility Bed Mobility Overal bed mobility: Needs Assistance Bed Mobility: Supine to Sit, Sit to Supine     Supine to sit: Min assist Sit to supine: Min assist        Transfers Overall transfer level: Needs assistance Equipment used: Rolling walker (2 wheels) Transfers: Sit to/from Stand Sit to Stand: Min assist                  Balance Overall balance assessment: Needs assistance Sitting-balance support: Feet supported Sitting balance-Leahy Scale: Good     Standing balance support: No upper extremity supported, During functional activity Standing balance-Leahy Scale: Poor                             ADL either performed or assessed with clinical judgement   ADL Overall ADL's : Needs assistance/impaired                                       General ADL Comments: MIN A + HHA for toilet t/f with significant furniture walking noted. MIN A + RW for ADL t/f, assist for RW mgmt (on turns pt with RW on 2 legs). MOD A don underwear standing, SBA doff sitting     Vision       Perception     Praxis     Communication Communication Communication: No apparent difficulties   Cognition Arousal: Alert Behavior During Therapy: Impulsive, WFL for tasks assessed/performed Cognition: History of cognitive impairments, Cognition impaired, No family/caregiver present to determine baseline             OT - Cognition Comments: pleasantly confused, impulsive but redirectable t/o session                 Following commands: Impaired Following commands impaired: Follows one step commands with increased time      Cueing   Cueing  Techniques: Verbal cues, Tactile cues  Exercises      Shoulder Instructions       General Comments      Pertinent Vitals/ Pain       Pain Assessment Pain Assessment: Faces Faces Pain Scale: Hurts even more Pain Location: back Pain Descriptors / Indicators: Aching Pain Intervention(s): Limited activity within patient's tolerance, Patient requesting pain meds-RN notified   Frequency  Min 2X/week        Progress Toward Goals  OT Goals(current goals can now be found in the care plan section)  Progress towards OT goals: Progressing toward goals  Acute Rehab OT Goals OT Goal Formulation: Patient unable to participate in goal setting Time  For Goal Achievement: 11/13/24 Potential to Achieve Goals: Fair ADL Goals Pt Will Perform Grooming: with min assist;standing Pt Will Perform Lower Body Dressing: with min assist;with caregiver independent in assisting;sit to/from stand Pt Will Transfer to Toilet: with min assist;ambulating;regular height toilet  Plan      Co-evaluation                 AM-PAC OT 6 Clicks Daily Activity     Outcome Measure   Help from another person eating meals?: A Little Help from another person taking care of personal grooming?: A Little Help from another person toileting, which includes using toliet, bedpan, or urinal?: A Little Help from another person bathing (including washing, rinsing, drying)?: A Lot Help from another person to put on and taking off regular upper body clothing?: A Little Help from another person to put on and taking off regular lower body clothing?: A Lot 6 Click Score: 16    End of Session Equipment Utilized During Treatment: Gait belt;Rolling walker (2 wheels)  OT Visit Diagnosis: Other abnormalities of gait and mobility (R26.89);Muscle weakness (generalized) (M62.81)   Activity Tolerance Patient tolerated treatment well   Patient Left in bed;with call bell/phone within reach;with family/visitor present   Nurse Communication Patient requests pain meds        Time: 8546-8478 OT Time Calculation (min): 28 min  Charges: OT General Charges $OT Visit: 1 Visit OT Treatments $Self Care/Home Management : 8-22 mins $Therapeutic Activity: 8-22 mins  Elston Slot, M.S. OTR/L  11/09/24, 3:29 PM  ascom [phone]

## 2024-11-09 NOTE — Progress Notes (Signed)
 Progress Note   Patient: Albert [name] FMW:[id number] DOB: [birth year] DOA: 10/24/2024     16 DOS: the patient was seen and examined on 11/09/2024   Brief hospital course: 78 year old male with extensive past medical history including AAA with recent endovascular stent grafting 10/21/2024, dementia, Barrett's esophagus, chronic pain syndrome, history of CVA, who presented on this admission with hematemesis.  Patient has recently been on antiplatelet therapy as well as NSAIDs.  He underwent CTA which revealed active extravasation of the gastric lumen.  Vascular surgery evaluated the patient and did not feel that the bleeding was related to prior surgery.  GI was consulted and patient underwent EGD which revealed large clotted blood in the stomach and patient was unable to finish the procedure.  He was then transferred to the ICU.  Interventional radiology was then consulted and patient underwent embolization of the mid segment of the gastroepiploic artery along the greater curvature of the stomach.  Repeat EGD showed no evidence of underlying lesion or continued bleeding.  His ICU stay was then further complicated by significant agitated delirium, acute hypoxic respiratory failure due to pulmonary edema in setting of large volume resuscitation, and necessitated NG tube feeds.  He was transferred to the [facility] service on 11/15.  Before transfer he spiked a new fever and there was concern for aspiration pneumonia, so he was started empirically on Zosyn .  SLP evaluation 11/17 allowed patient to get started on dysphagia diet but he requires significant prompting and is unlikely to sustain nutrition via oral sources alone. 11/18 patient began having significant bowel movements with some melena.  Hemoglobin trending lower.  GOC conversation with the patient's wife and sons.  At this time they like to pursue all aggressive care.  Palliative has been consulted.  11/19.  Hemoglobin little lower than yesterday at 7.8,  I will give 1 unit of packed red blood cells.  Patient does have intermittent agitation and will likely not be able to do a CT angio or bleeding scan.  Since hemoglobin only drifted down, I think we can watch conservatively at this point. 11/20.  Hemoglobin 8.4, patient ate a little bit better, patient walked with physical therapy. 11/21.  Hemoglobin 8.3 11/22.  Patient off oxygen this morning. 11/23.  Foley catheter removed last night and patient urinating.  Creatinine down to 1.0 and hemoglobin 8.3.  Will give IV iron .  Patient doing a lot better with his mental status than earlier in the hospitalization. 11/24.  We got a bed offer from rehab.  Sitter discontinued.  Patient walking back and forth to the bathroom with staff.  Insurance authorization started.  Assessment and Plan: * Acute upper GI bleed Patient had coil embolization on 11/8 with interventional radiology.  Patient had 2 EGDs unable to find the source of bleeding.  Patient had quite a bit of bowel movements on 11/18.  Hemoglobin drifted down to 7.8 on 11/19 and given a unit of blood.  Last hemoglobin 8.3.  Conservative management.  Spoke with gastroenterology and patient's wife declined endoscopy and does not want to put him through anesthesia again.  Switch Protonix  over to oral twice daily.  Last hemoglobin stable at 8.3.  Received IV iron  on 11/23.  Acute blood loss anemia Patient received 5 units of blood and 1 unit of fresh frozen plasma from 11/8 through 11/10.  Given 1 unit of blood on 11/19.  Last hemoglobin 8.3.  Severe Alzheimer's dementia with agitation (HCC) Continue Seroquel  at nighttime to 50 mg.  Risperdal  as needed for agitation.  Patient not agitated after getting rid of oxygen, Foley catheter and mittens.  Patient on Aricept .  Mental status much improved from earlier in the week when I saw him.  Patient able to follow commands and stand up for me.  Sitter discontinued since we have a rehab bed offer.  Starting  insurance authorization.  Elevated liver function tests AST normalized and ALT down to 69.  Electrolyte abnormality Hyponatremia and hypokalemia during the hospital course.  AKI (acute kidney injury) Resolved.  Last creatinine 1.0.  Myocardial injury Secondary to GI bleed.  Holding aspirin  and Plavix .  Acute urinary retention On Flomax .  Foley catheter removed on 11/22 near midnight.  Patient walking to the bathroom to urinate.  Chronic diastolic CHF (congestive heart failure) (HCC) Last EF 65%.  Had volume overload during the hospitalization secondary to large volume resuscitation.  Not currently in heart failure.  Dysphagia NG tube removed on 11/8.  Patient on dysphagia diet.  Patient's wife states that she does not want to place a PEG tube.  Aspiration pneumonia (HCC) Patient completed up to 5 days of Zosyn  on 11/19.  Pressure injury Wound 10/29/24 0900 Pressure Injury Coccyx Medial;Left Deep Tissue Pressure Injury - Purple or maroon localized area of discolored intact skin or blood-filled blister due to damage of underlying soft tissue from pressure and/or shear. (Active)     Wound 10/30/24 0800 Pressure Injury Sacrum Mid Stage 2 -  Partial thickness loss of dermis presenting as a shallow open injury with a red, pink wound bed without slough. (Active)     Wound 10/30/24 0800 Pressure Injury Buttocks Left;Lower Stage 2 -  Partial thickness loss of dermis presenting as a shallow open injury with a red, pink wound bed without slough. (Active)   Documented on 11/13 and 11/14.  Patient admitted on 11/8.  Malnutrition of moderate degree Continue dysphagia diet  Acute respiratory failure with hypoxia (HCC) Previously on high flow nasal cannula 5 L.  Off oxygen.  AAA (abdominal aortic aneurysm) without rupture Patient had stent grafting on 11/5.  Hyperlipidemia, unspecified Holding cholesterol medication  H/O stroke without residual deficits No blood thinners.  GERD  (gastroesophageal reflux disease) On twice daily Protonix        Subjective: Patient feels okay.  Patient able to straight leg raise with some coaching.  Patient able to stand up on his own today.  They tell me that he has been walking to the bathroom with staff.  Physical Exam: Vitals:   11/08/24 1928 11/09/24 0500 11/09/24 0508 11/09/24 0815  BP: 130/76  131/78 139/80  Pulse: 88  98 92  Resp:    17  Temp: 98.4 F (36.9 C)  97.9 F (36.6 C) 98.1 F (36.7 C)  TempSrc: Oral  Oral Oral  SpO2: 97%  97% 97%  Weight:  86.6 kg    Height:       Physical Exam HENT:     Head: Normocephalic.  Eyes:     General: Lids are normal.     Conjunctiva/sclera: Conjunctivae normal.  Cardiovascular:     Rate and Rhythm: Normal rate and regular rhythm.     Heart sounds: S1 normal and S2 normal. Murmur heard.     Systolic murmur is present with a grade of 2/6.  Pulmonary:     Breath sounds: Examination of the right-lower field reveals decreased breath sounds. Examination of the left-lower field reveals decreased breath sounds. Decreased breath sounds present. No wheezing, rhonchi or  rales.  Abdominal:     Palpations: Abdomen is soft.     Tenderness: There is no abdominal tenderness.  Musculoskeletal:     Right lower leg: Swelling present.     Left lower leg: Swelling present.  Skin:    General: Skin is warm.     Findings: No rash.  Neurological:     Mental Status: He is alert.     Comments: Patient able to stand up from the bed on his own without any help.  He was able to straight leg raise today with some coaching.     Data Reviewed: No new data today  Family Communication: Spoke with son on the phone and wife at the bedside  Disposition: Status is: Inpatient Remains inpatient appropriate because: We got a bed offer.  Started english as a second language teacher.  Sitter discontinued this morning.  Planned Discharge Destination: Rehab    Time spent: 28 minutes  Author: Charlie Patterson,  MD 11/09/2024 11:49 AM  For on call review [email or web address]

## 2024-11-09 NOTE — Progress Notes (Signed)
 Speech Language Pathology Treatment: Dysphagia  Patient Details Name: Albert [name] MRN: [id number] DOB: 1946/06/16 Today's Date: 11/09/2024 Time: 1345-1430 SLP Time Calculation (min) (ACUTE ONLY): 45 min  Assessment / Plan / Recommendation Clinical Impression  Pt seen today for ongoing assessment of swallowing and trials to upgrade diet to soft foods(per calm State and improved Cognitive attention to tasks).  Pt awake/alert to engage in po trials w/ this SLP. Pt closed eyes intermittently but awakened/opened again w/ verbal/tactile cues. Wife stated he was tired after going to the bathroom and being up this morning.  Pt has Baseline Dementia and has been agitated/confused during this admit requiring a Sitter and Mitts. He intermittently verbalized; responded to his name. He attended to po tasks. He required MOD-MAX cues for follow through though. No NGT present now. Wife and Son present in room.  On RA, afebrile. WBC WNL.  Pt appears to present w/ grossly functional oropharyngeal phase swallowing w/ No overt oropharyngeal phase dysphagia noted- no neuromuscular deficits noted. Pt consumed po trials w/ No immediate, overt clinical s/s of aspiration during po trials.  Pt appears at reduced risk for aspiration/aspiration pneumonia when following general aspiration precautions. However, pt does have challenging factors that could impact oropharyngeal swallowing to include severe Dementia/Cognitive decline, deconditioning/weakness, and lengthy hospitalization. These factors can increase risk for dysphagia as well as decreased oral intake overall.   During po trials, pt consumed mech soft consistencies and thin liquids w/ no overt coughing, decline in vocal quality, or change in respiratory presentation during/post trials. Oral phase appeared grossly [facility] w/ timely bolus management, mastication, and control of bolus propulsion for A-P transfer for swallowing. Oral clearing achieved w/ all trial  consistencies -- moistened, soft foods given.  Encouraged Family to order foods that pt can help w/ self-feeding for improved safety. Cautioned need for 100% Supervision to monitor and stop any Impulsive eating/drinking.   Recommend a more Mech Soft consistency diet w/ well-Chopped meats, moistened foods for ease of intake d/t Cognitive decline; Thin liquids -- carefully monitor any straw use for Small sips, and pt should help to Hold Cup when drinking. Recommend general aspiration precautions to include sitting Fully upright and reducing distractions during meals. Eat/drink Slowly clearing mouth b/t bites. Pills CRUSHED in Puree for safer, easier swallowing -- pt has done this during admit, and it was encouraged for D/C to the Wife.  Education given on Pills in Puree; food consistencies and easy to eat options; general aspiration precautions to pt and Family present. No further Acute ST services indicated; f/u at SNF if need is determined recommended. NSG to reconsult if any new needs arise during this admit.  MD/NSG updated, agreed. Family agreed. Recommend Dietician f/u for support; Palliative Care for support re: oral intake/swallowing in setting of Dementia. Precautions posted in room, chart.       HPI HPI: Pt is a 78 year old male admitted with acute blood loss anemia secondary to upper GI bleed status post coil embolization of the gastroepiploic artery on 11/8 with EGD twice not show any underlying lesion or source of continued bleed. Pt intubated for hemorraghic shock then extubated to  on 10/27/2024.   PMH includes:  Dementia, acute metabolic encephalopathy, HFpEF, HTN, HLD, acute hypoxic respiratory failure, pulmonary edema, acute blood loss anemia, hypernatremia, GERD, malnutrition.    CXR: Stable bilateral interstitial opacities, suspicious for pulmonary edema.      SLP Plan  All goals met          Recommendations  Diet recommendations: Dysphagia 3 (mechanical soft);Thin liquid  (gravies to moisten well) Liquids provided via: Cup;Straw (monitor any straw use for single sips= pinch to limit as needed) Medication Administration: Crushed with puree Supervision: Patient able to self feed;Staff to assist with self feeding;Full supervision/cueing for compensatory strategies Compensations: Minimize environmental distractions;Slow rate;Small sips/bites;Lingual sweep for clearance of pocketing;Follow solids with liquid Postural Changes and/or Swallow Maneuvers: Out of bed for meals;Seated upright 90 degrees;Upright 30-60 min after meal (reflux precs.)                 (Palliative Care f/u for GOC, support; Dietician support) Oral care BID;Oral care before and after PO;Staff/trained caregiver to provide oral care   Frequent or constant Supervision/Assistance (d/t Dementia) Dysphagia, oropharyngeal phase (R13.12) (in setting of significant Cognitive decline/Baseline severe Dementia per chart; deconditioning/illness/hospitalization; need for feeding assistance w/ feeding at meals)     All goals met      [name], MS, CCC-SLP Speech Language Pathologist Rehab Services; [facility] [phone] (ascom) [name]  11/09/2024, 3:42 PM

## 2024-11-10 ENCOUNTER — Ambulatory Visit (INDEPENDENT_AMBULATORY_CARE_PROVIDER_SITE_OTHER): Admitting: Vascular Surgery

## 2024-11-10 ENCOUNTER — Other Ambulatory Visit (INDEPENDENT_AMBULATORY_CARE_PROVIDER_SITE_OTHER)

## 2024-11-10 DIAGNOSIS — E7849 Other hyperlipidemia: Secondary | ICD-10-CM

## 2024-11-10 LAB — HEMOGLOBIN: Hemoglobin: 8.7 g/dL — ABNORMAL LOW (ref 13.0–17.0)

## 2024-11-10 MED ORDER — TAMSULOSIN HCL 0.4 MG PO CAPS: 0.4000 mg | ORAL_CAPSULE | Freq: Every day | ORAL | 0 refills | Status: DC

## 2024-11-10 MED ORDER — LIDOCAINE 5 % EX PTCH: MEDICATED_PATCH | CUTANEOUS | 0 refills | Status: DC

## 2024-11-10 MED ORDER — LOPERAMIDE HCL 2 MG PO CAPS: 2.0000 mg | ORAL_CAPSULE | ORAL | 0 refills | Status: DC | PRN

## 2024-11-10 MED ORDER — ENSURE PLUS HIGH PROTEIN PO LIQD: 237.0000 mL | Freq: Three times a day (TID) | ORAL | 0 refills | Status: DC

## 2024-11-10 MED ORDER — ASCORBIC ACID 500 MG PO TABS: 500.0000 mg | ORAL_TABLET | Freq: Two times a day (BID) | ORAL | 0 refills | Status: DC

## 2024-11-10 MED ORDER — RISPERIDONE 0.5 MG PO TABS: 0.5000 mg | ORAL_TABLET | Freq: Two times a day (BID) | ORAL | 0 refills | Status: DC | PRN

## 2024-11-10 MED ORDER — RISAQUAD PO CAPS: 2.0000 | ORAL_CAPSULE | Freq: Every day | ORAL | 0 refills | Status: AC

## 2024-11-10 MED ORDER — PANTOPRAZOLE SODIUM 40 MG PO TBEC: 40.0000 mg | DELAYED_RELEASE_TABLET | Freq: Two times a day (BID) | ORAL | 0 refills | Status: DC

## 2024-11-10 MED ORDER — QUETIAPINE FUMARATE 50 MG PO TABS: 50.0000 mg | ORAL_TABLET | Freq: Every day | ORAL | 0 refills | Status: DC

## 2024-11-10 NOTE — TOC Progression Note (Signed)
 Transition of Care (TOC) - Progression Note    Patient Details  Name: Albert [name] MRN: [id number] Date of Birth: November 05, 1946  Transition of Care [facility]) CM/SW Contact  Victory Jackquline RAMAN, RN Phone Number: 11/10/2024, 9:03 AM  Clinical Narrative:   RNCM received a message via secure chat from the CMA stating Approved 11/09/24-11/11/24 auth id # [id number] plan auth id # [id number].                    Expected Discharge Plan and Services         Expected Discharge Date: 11/10/24                                     Social Drivers of Health (SDOH) Interventions SDOH Screenings   Food Insecurity: Patient Unable To Answer (10/24/2024)  Housing: Patient Unable To Answer (10/24/2024)  Transportation Needs: Patient Unable To Answer (10/24/2024)  Utilities: Patient Unable To Answer (10/24/2024)  Financial Resource Strain: Low Risk  (08/31/2024)   Received from [facility]  Social Connections: Unknown (10/21/2024)  Tobacco Use: High Risk (10/24/2024)    Readmission Risk Interventions    10/30/2024   12:32 PM  Readmission Risk Prevention Plan  Transportation Screening Complete  PCP or Specialist Appt within 5-7 Days Complete  Home Care Screening Complete  Medication Review (RN CM) Complete

## 2024-11-10 NOTE — TOC Transition Note (Signed)
 Transition of Care [facility]) - Discharge Note   Patient Details  Name: Albert [name] MRN: [id number] Date of Birth: 08/01/1946  Transition of Care [facility]) CM/SW Contact:  Victory Jackquline RAMAN, RN Phone Number: 11/10/2024, 9:29 AM   Clinical Narrative:   Patient discharging to [facility], RM# [id number]. Nurse to call report to [phone]. Son is here to transport patient to [facility].Pt has discharge orders, no further concerns. RNCM signing off.    Final next level of care: Skilled Nursing Facility Barriers to Discharge: Barriers Resolved   Patient Goals and CMS Choice            Discharge Placement              Patient chooses bed at: [facility] [place] Patient to be transferred to facility by: Son Name of family member notified: Margaret/Spouse Patient and family notified of of transfer: 11/10/24  Discharge Plan and Services Additional resources added to the After Visit Summary for                                       Social Drivers of Health (SDOH) Interventions SDOH Screenings   Food Insecurity: Patient Unable To Answer (10/24/2024)  Housing: Patient Unable To Answer (10/24/2024)  Transportation Needs: Patient Unable To Answer (10/24/2024)  Utilities: Patient Unable To Answer (10/24/2024)  Financial Resource Strain: Low Risk  (08/31/2024)   Received from [facility]  Social Connections: Unknown (10/21/2024)  Tobacco Use: High Risk (10/24/2024)     Readmission Risk Interventions    10/30/2024   12:32 PM  Readmission Risk Prevention Plan  Transportation Screening Complete  PCP or Specialist Appt within 5-7 Days Complete  Home Care Screening Complete  Medication Review (RN CM) Complete

## 2024-11-10 NOTE — Plan of Care (Signed)
  Problem: Education: Goal: Knowledge of General Education information will improve Description: Including pain rating scale, medication(s)/side effects and non-pharmacologic comfort measures Outcome: Progressing   Problem: Health Behavior/Discharge Planning: Goal: Ability to manage health-related needs will improve Outcome: Progressing   Problem: Elimination: Goal: Will not experience complications related to urinary retention Outcome: Progressing   Problem: Safety: Goal: Ability to remain free from injury will improve Outcome: Progressing   Problem: Skin Integrity: Goal: Risk for impaired skin integrity will decrease Outcome: Progressing   Problem: Activity: Goal: Ability to return to baseline activity level will improve Outcome: Progressing   Problem: Cardiovascular: Goal: Ability to achieve and maintain adequate cardiovascular perfusion will improve Outcome: Progressing Goal: Vascular access site(s) Level 0-1 will be maintained Outcome: Progressing   Problem: Safety: Goal: Non-violent Restraint(s) Outcome: Progressing

## 2024-11-10 NOTE — Discharge Summary (Signed)
 Physician Discharge Summary   Patient: Albert [name] MRN: [id number] DOB: 1946/11/16  Admit date:     10/24/2024  Discharge date: 11/10/24  Discharge Physician: Charlie Patterson   PCP: Lenon [name] ORN, MD   Recommendations at discharge:   Follow-up team at rehab 1 day Follow-up PCP after discharge from rehab Recommend checking hemoglobin weekly  Discharge Diagnoses: Principal Problem:   Acute upper GI bleed Active Problems:   Acute blood loss anemia   Severe Alzheimer's dementia with agitation (HCC)   GERD (gastroesophageal reflux disease)   H/O stroke without residual deficits   Hyperlipidemia, unspecified   AAA (abdominal aortic aneurysm) without rupture   GI bleeding   Upper GI bleed   Acute respiratory failure with hypoxia (HCC)   Toxic metabolic encephalopathy   Malnutrition of moderate degree   Pressure injury   Aspiration pneumonia (HCC)   Dysphagia   Chronic diastolic CHF (congestive heart failure) (HCC)   Acute urinary retention   Myocardial injury   AKI (acute kidney injury)   Electrolyte abnormality   Elevated liver function tests    Hospital Course: 78 year old male with extensive past medical history including AAA with recent endovascular stent grafting 10/21/2024, dementia, Barrett's esophagus, chronic pain syndrome, history of CVA, who presented on this admission with hematemesis.  Patient has recently been on antiplatelet therapy as well as NSAIDs.  He underwent CTA which revealed active extravasation of the gastric lumen.  Vascular surgery evaluated the patient and did not feel that the bleeding was related to prior surgery.  GI was consulted and patient underwent EGD which revealed large clotted blood in the stomach and patient was unable to finish the procedure.  He was then transferred to the ICU.  Interventional radiology was then consulted and patient underwent embolization of the mid segment of the gastroepiploic artery along the greater  curvature of the stomach.  Repeat EGD showed no evidence of underlying lesion or continued bleeding.  His ICU stay was then further complicated by significant agitated delirium, acute hypoxic respiratory failure due to pulmonary edema in setting of large volume resuscitation, and necessitated NG tube feeds.  He was transferred to the [facility] service on 11/15.  Before transfer he spiked a new fever and there was concern for aspiration pneumonia, so he was started empirically on Zosyn .  SLP evaluation 11/17 allowed patient to get started on dysphagia diet but he requires significant prompting and is unlikely to sustain nutrition via oral sources alone. 11/18 patient began having significant bowel movements with some melena.  Hemoglobin trending lower.  GOC conversation with the patient's wife and sons.  At this time they like to pursue all aggressive care.  Palliative has been consulted.  11/19.  Hemoglobin little lower than yesterday at 7.8, I will give 1 unit of packed red blood cells.  Patient does have intermittent agitation and will likely not be able to do a CT angio or bleeding scan.  Since hemoglobin only drifted down, I think we can watch conservatively at this point. 11/20.  Hemoglobin 8.4, patient ate a little bit better, patient walked with physical therapy. 11/21.  Hemoglobin 8.3 11/22.  Patient off oxygen this morning. 11/23.  Foley catheter removed last night and patient urinating.  Creatinine down to 1.0 and hemoglobin 8.3.  Will give IV iron .  Patient doing a lot better with his mental status than earlier in the hospitalization. 11/24.  We got a bed offer from rehab.  Sitter discontinued.  Patient walking back and  forth to the bathroom with staff.  Insurance authorization started.  Assessment and Plan: * Acute upper GI bleed Patient had coil embolization on 11/8 with interventional radiology.  Patient had 2 EGDs unable to find the source of bleeding.  Patient had quite a bit of bowel movements  on 11/18.  Hemoglobin drifted down to 7.8 on 11/19 and given a unit of blood.  Last hemoglobin 8.3.  Conservative management.  Spoke with gastroenterology and patient's wife declined endoscopy and does not want to put him through anesthesia again.  Switch Protonix  over to oral twice daily.  Last hemoglobin stable at 8.3.  Received IV iron  on 11/23.  Acute blood loss anemia Patient received 5 units of blood and 1 unit of fresh frozen plasma from 11/8 through 11/10.  Given 1 unit of blood on 11/19.  Last hemoglobin 8.3.  Severe Alzheimer's dementia with agitation (HCC) Continue Seroquel  at nighttime to 50 mg.  Risperdal  as needed for agitation.  Patient not agitated after getting rid of oxygen, Foley catheter and mittens.  Patient on Aricept .  Mental status much improved from earlier in the week when I saw him.  Patient able to follow commands and stand up for me.  Sitter discontinued since we have a rehab bed offer.  Starting insurance authorization.  Elevated liver function tests AST normalized and ALT down to 69.  Electrolyte abnormality Hyponatremia and hypokalemia during the hospital course.  AKI (acute kidney injury) Resolved.  Last creatinine 1.0.  Myocardial injury Secondary to GI bleed.  Holding aspirin  and Plavix .  Acute urinary retention On Flomax .  Foley catheter removed on 11/22 near midnight.  Patient walking to the bathroom to urinate.  Chronic diastolic CHF (congestive heart failure) (HCC) Last EF 65%.  Had volume overload during the hospitalization secondary to large volume resuscitation.  Not currently in heart failure.  Dysphagia NG tube removed on 11/8.  Patient on dysphagia diet.  Patient's wife states that she does not want to place a PEG tube.  Aspiration pneumonia (HCC) Patient completed up to 5 days of Zosyn  on 11/19.  Pressure injury Wound 10/29/24 0900 Pressure Injury Coccyx Medial;Left Deep Tissue Pressure Injury - Purple or maroon localized area of  discolored intact skin or blood-filled blister due to damage of underlying soft tissue from pressure and/or shear. (Active)     Wound 10/30/24 0800 Pressure Injury Sacrum Mid Stage 2 -  Partial thickness loss of dermis presenting as a shallow open injury with a red, pink wound bed without slough. (Active)     Wound 10/30/24 0800 Pressure Injury Buttocks Left;Lower Stage 2 -  Partial thickness loss of dermis presenting as a shallow open injury with a red, pink wound bed without slough. (Active)   Documented on 11/13 and 11/14.  Patient admitted on 11/8.  Malnutrition of moderate degree Continue dysphagia diet  Acute respiratory failure with hypoxia (HCC) Previously on high flow nasal cannula 5 L.  Off oxygen.  AAA (abdominal aortic aneurysm) without rupture Patient had stent grafting on 11/5.  Hyperlipidemia, unspecified Holding cholesterol medication  H/O stroke without residual deficits No blood thinners.  GERD (gastroesophageal reflux disease) On twice daily Protonix          Consultants: Gastroenterology, interventional radiology, vascular surgery, PT OT and speech to Procedures performed: 2 endoscopies, coil embolization by interventional radiology Disposition: Rehabilitation facility Diet recommendation:  Dysphagia type 3 thin Liquid DISCHARGE MEDICATION: Allergies as of 11/10/2024       Reactions   Codeine Nausea And Vomiting  Medication List     STOP taking these medications    amLODipine  10 MG tablet Commonly known as: NORVASC    aspirin  EC 81 MG tablet   clopidogrel  75 MG tablet Commonly known as: PLAVIX    diphenhydramine-acetaminophen  25-500 MG Tabs tablet Commonly known as: TYLENOL  PM   DULoxetine  60 MG capsule Commonly known as: CYMBALTA    ferrous sulfate  325 (65 FE) MG tablet   ibuprofen  400 MG tablet Commonly known as: ADVIL    naproxen sodium 220 MG tablet Commonly known as: ALEVE   rosuvastatin  20 MG tablet Commonly known  as: CRESTOR        TAKE these medications    acetaminophen  325 MG tablet Commonly known as: TYLENOL  Take 650 mg by mouth every 8 (eight) hours as needed for moderate pain.   acidophilus Caps capsule Take 2 capsules by mouth daily for 14 days.   ascorbic acid  500 MG tablet Commonly known as: VITAMIN C  Take 1 tablet (500 mg total) by mouth 2 (two) times daily.   cyanocobalamin  1000 MCG tablet Commonly known as: VITAMIN B12 Take 1,000 mcg by mouth.  Take 1,000 mcg by mouth once daily   donepezil  10 MG tablet Commonly known as: ARICEPT  Take 10 mg by mouth.  TAKE 1 TABLET(10 MG) BY MOUTH AT BEDTIME   feeding supplement Liqd Take 237 mLs by mouth 3 (three) times daily between meals.   lidocaine  5 % Commonly known as: LIDODERM  Remove & Discard patch within 12 hours or as directed by MD. Apply to area of pain   loperamide  2 MG capsule Commonly known as: IMODIUM  Take 1 capsule (2 mg total) by mouth as needed for diarrhea or loose stools.   multivitamin with minerals Tabs tablet Take 1 tablet by mouth daily.   pantoprazole  40 MG tablet Commonly known as: PROTONIX  Take 1 tablet (40 mg total) by mouth 2 (two) times daily.   QUEtiapine  50 MG tablet Commonly known as: SEROQUEL  Take 1 tablet (50 mg total) by mouth at bedtime. What changed:  medication strength how much to take   risperiDONE  0.5 MG tablet Commonly known as: RISPERDAL  Take 1 tablet (0.5 mg total) by mouth 2 (two) times daily as needed (agitation).   tamsulosin  0.4 MG Caps capsule Commonly known as: FLOMAX  Take 1 capsule (0.4 mg total) by mouth daily after supper.        Contact information for after-discharge care     Destination     [facility] [place], [place]. [name]   Service: Skilled Nursing Contact information: [address] [place]   [address] [phone]                    Discharge Exam: Filed Weights   11/08/24 0500 11/09/24 0500 11/10/24 0334  Weight: 83.7 kg  86.6 kg 86.3 kg   Physical Exam HENT:     Head: Normocephalic.  Eyes:     General: Lids are normal.     Conjunctiva/sclera: Conjunctivae normal.  Cardiovascular:     Rate and Rhythm: Normal rate and regular rhythm.     Heart sounds: S1 normal and S2 normal. Murmur heard.     Systolic murmur is present with a grade of 2/6.  Pulmonary:     Breath sounds: Examination of the right-lower field reveals decreased breath sounds. Examination of the left-lower field reveals decreased breath sounds. Decreased breath sounds present. No wheezing, rhonchi or rales.  Abdominal:     Palpations: Abdomen is soft.     Tenderness: There is  no abdominal tenderness.  Musculoskeletal:     Right lower leg: Swelling present.     Left lower leg: Swelling present.  Skin:    General: Skin is warm.     Findings: No rash.  Neurological:     Mental Status: He is alert.      Condition at discharge: stable  The results of significant diagnostics from this hospitalization (including imaging, microbiology, ancillary and laboratory) are listed below for reference.   Imaging Studies: DG Chest Port 1 View Result Date: 10/30/2024 EXAM: 1 VIEW(S) XRAY OF THE CHEST 10/30/2024 06:09:16 AM COMPARISON: 2 days ago. CLINICAL HISTORY: Acute respiratory failure (HCC) 5626. FINDINGS: LINES, TUBES AND DEVICES: Feeding tube is seen entering stomach. Right internal jugular catheter is unchanged. LUNGS AND PLEURA: Stable bilateral interstitial densities concerning for possible edema. No pleural effusion. No pneumothorax. HEART AND MEDIASTINUM: Stable cardiomediastinal silhouette. BONES AND SOFT TISSUES: No acute osseous abnormality. IMPRESSION: 1. Stable bilateral interstitial opacities, suspicious for pulmonary edema. Electronically signed by: Lynwood Seip MD 10/30/2024 08:41 AM EST RP Workstation: [id number] 1 View Result Date: 10/28/2024 EXAM: 1 VIEW XRAY OF THE ABDOMEN 10/28/2024 10:35:45 AM COMPARISON: Comparison  yesterday. CLINICAL HISTORY: Abdominal pain. FINDINGS: LINES, TUBES AND DEVICES: Distal tip of feeding tube is seen in expected position of distal stomach. BOWEL: Nonobstructive bowel gas pattern. SOFT TISSUES: Aortic stent graft is again noted. Postsurgical changes are noted in the left abdomen. No opaque urinary calculi. BONES: No acute osseous abnormality. IMPRESSION: 1. Feeding tube tip projects over the distal stomach, appropriate position. Electronically signed by: Lynwood Seip MD 10/28/2024 10:54 AM EST RP Workstation: [id number]   DG Chest Port 1 View Result Date: 10/28/2024 EXAM: 1 VIEW(S) XRAY OF THE CHEST 10/28/2024 10:35:45 AM COMPARISON: Yesterday, 10/27/2024. CLINICAL HISTORY: Acute respiratory failure with hypoxia (HCC) [id number]. FINDINGS: LINES, TUBES AND DEVICES: Stable feeding tube and right internal jugular catheter. LUNGS AND PLEURA: Stable central pulmonary vascular congestion. Slightly decreased bibasilar opacities are noted suggesting improving atelectasis or edema. No pleural effusion. No pneumothorax. HEART AND MEDIASTINUM: No acute abnormality of the cardiac and mediastinal silhouettes. BONES AND SOFT TISSUES: No acute osseous abnormality. IMPRESSION: 1. Stable central pulmonary vascular congestion. 2. Slightly decreased bibasilar opacities, favor improving atelectasis or edema. Electronically signed by: Lynwood Seip MD 10/28/2024 10:52 AM EST RP Workstation: [id number]   DG Chest Port 1 View Result Date: 10/27/2024 CLINICAL DATA:  Hypoxia, confusion EXAM: PORTABLE CHEST 1 VIEW COMPARISON:  10/24/2024 FINDINGS: Single frontal view of the chest demonstrates enteric catheter passing below diaphragm, tip excluded by collimation. Interval removal of the endotracheal tube. Stable right internal jugular catheter tip overlying superior vena cava. The cardiac silhouette is unremarkable. There is increased pulmonary vascular congestion with developing bibasilar interstitial and ground-glass  opacities compatible with edema. Small left pleural effusion. No pneumothorax. IMPRESSION: 1. Constellation of findings most consistent with worsening volume status and developing bibasilar edema. Electronically Signed   By: Ozell Daring M.D.   On: 10/27/2024 15:39   DG Abd Portable 1V Result Date: 10/27/2024 CLINICAL DATA:  Feeding tube placement. EXAM: PORTABLE ABDOMEN - 1 VIEW COMPARISON:  Recent CTA. FINDINGS: Tip of the weighted enteric tube to the right of midline in the region of the distal stomach. Embolization coils in the left upper quadrant. Aortic stent graft in place. IMPRESSION: Tip of the weighted enteric tube to the right of midline in the region of the distal stomach. Electronically Signed   By: Andrea Marlee HERO.D.  On: 10/27/2024 12:36   IR EMBO ART  VEN HEMORR LYMPH EXTRAV  INC GUIDE ROADMAPPING Result Date: 10/24/2024 INDICATION: 78 year old male with upper GI bleed. Large volume clot visualized along the greater curvature at endoscopy. Source of bleeding could not be identified. Suspect bleeding from branches of the gastroepiploic artery along the greater curvature of the stomach. EXAM: IR EMBO ART  VEN HEMORR LYMPH EXTRAV  INC GUIDE ROADMAPPING MEDICATIONS: None. ANESTHESIA/SEDATION: Sedated intubated. CONTRAST:  100mL OMNIPAQUE  IOHEXOL  300 MG/ML  SOLN FLUOROSCOPY: Radiation Exposure Index (as provided by the fluoroscopic device): 846.1 mGy Kerma COMPLICATIONS: SIR Level A - No therapy, no consequence. PROCEDURE: Informed consent was obtained from the patient following explanation of the procedure, risks, benefits and alternatives. The patient understands, agrees and consents for the procedure. All questions were addressed. A time out was performed prior to the initiation of the procedure. Maximal barrier sterile technique utilized including caps, mask, sterile gowns, sterile gloves, large sterile drape, hand hygiene, and Betadine prep. The right common femoral artery was  interrogated with ultrasound and found to be widely patent. An image was obtained and stored for the medical record. Local anesthesia was attained by infiltration with 1% lidocaine . A small dermatotomy was made. Under real-time sonographic guidance, the vessel was punctured with a 21 gauge micropuncture needle. Using standard technique, the initial micro needle was exchanged over a 0.018 micro wire for a transitional 4 French micro sheath. The micro sheath was then exchanged over a 0.035 wire for a 5 French vascular sheath. A C2 cobra catheter was advanced over a Bentson wire and carefully navigated through the fresh endograft. After successful navigation, the catheter was used to select the celiac artery. A celiac arteriogram was performed. Conventional anatomy. No evidence of active bleeding. The C2 cobra catheter was advanced over a Glidewire into the common hepatic artery and arteriography was performed. The origin of the gastroduodenal artery is easily visualized. A Cook cantata 2.5 French microcatheter was advanced over a Fathom 16 wire into the gastroduodenal artery and additional arteriography was performed. The gastroepiploic artery is well visualized. No evidence of active bleeding. The microcatheter was then advanced further out into the right gastroepiploic artery and digital subtraction angiography was again obtained. There is an area of hyperemia along the mid aspect of the greater curvature of the stomach. This is suspected to correspond with the site of potential bleeding. Therefore, the microcatheter was advanced out farther into the main gastroepiploic artery just beyond the region of hyperemia. This segment of artery was then coiled back across the segment of hyperemia using a series of penumbra detachable microcoils. Follow-up arteriography demonstrates no further perfusion of the hyperemia area. The microcatheter was removed. The 5 French cobra catheter was then brought back into the origin of  the celiac artery and arteriography was performed. The origin of the left gastric artery was identified. Microcatheter was reintroduced into the left gastric artery and arteriography was performed. There is an area of abnormal vascularity arising from a proximal branch of the left gastric artery which appears to track along the stomach toward the region of hyperemia and bleeding. Several attempts were made to catheterize this vessel. While the wire could be advanced into the vessel, the microcatheter could not. Ultimately, epic and clear that there was an iatrogenic dissection at the origin of the left gastric artery due to the multiple attempts at catheterization. This reduced overall slow significantly in the left gastric artery and should serve as a temporary embolization. This dissection will  likely heal over time and requires no further treatment. The catheters were removed. Hemostasis was attained with a Celt arterial closure device. IMPRESSION: 1. Successful coil embolization of the mid segment of the gastroepiploic artery along the greater curvature of the stomach in the region of hyperemia suspected to be the source of bleeding. 2. There also is a small contributing vessel arising proximally from the left gastric artery. This vessel could not be catheterized due to extreme tortuosity, however an iatrogenic dissection at the origin of the artery does result in temporary flow reduction and should further aid in bleeding mitigation. Electronically Signed   By: Wilkie Lent M.D.   On: 10/24/2024 17:20   DG Chest Port 1 View Result Date: 10/24/2024 EXAM: 1 VIEW(S) XRAY OF THE CHEST 10/24/2024 01:55:10 PM COMPARISON: None available. CLINICAL HISTORY: Encounter for central line placement. FINDINGS: LINES, TUBES AND DEVICES: Right internal jugular central venous catheter in place with tip in expected region of distal superior vena cava. Endotracheal tube in place with tip 7.5 cm above carina. LUNGS AND  PLEURA: Left basilar atelectasis. No pulmonary edema. No pleural effusion. No pneumothorax. HEART AND MEDIASTINUM: Aortic arch calcifications. BONES AND SOFT TISSUES: No acute osseous abnormality. IMPRESSION: 1. Right internal jugular central venous catheter in the low SVC. 2. Endotracheal tube tip 7.5 cm from the carina. 3. Left basilar atelectasis. Electronically signed by: Norman Gatlin MD 10/24/2024 02:30 PM EST RP Workstation: [id number]   CT Angio Abd/Pel W and/or Wo Contrast Result Date: 10/24/2024 EXAM: CTA ABDOMEN AND PELVIS WITHOUT AND WITH CONTRAST 10/24/2024 07:57:02 AM TECHNIQUE: CTA images of the abdomen and pelvis without and with intravenous contrast. Three-dimensional MIP/volume rendered formations were performed. Automated exposure control, iterative reconstruction, and/or weight based adjustment of the mA/kV was utilized to reduce the radiation dose to as low as reasonably achievable. COMPARISON: CTA of the abdomen and pelvis 09/01/2024. CLINICAL HISTORY: 78 year old male with a history of endograft repair of abdominal aortic aneurysm. FINDINGS: VASCULATURE: AORTA: Bifurcated abdominal aortic endograft is patent. Evidence of endoleak at the proximal bifurcated portion of the device (series 6 images 108 through 121 and delayed series 13 image 45). The underlying native aneurysm sac has not significantly diminished since September and some images suggest it may be 1 or 2 mm larger (series 13 image 49 approximately 52 to 53 mm versus 51 mm at the same level previously; coronal image 27 of series 15 suggesting 53 mm; sagittal image 109 of series 10 suggesting 51 mm). Underlying advanced aortic atherosclerosis and chronic infrarenal abdominal aortic aneurysm which measured up to 5.3 cm diameter in September. Superimposed coil embolization of the left internal iliac artery. Advanced calcified iliac atherosclerosis bilaterally. Proximal femoral artery atherosclerosis. The major arterial structures in  the abdomen and pelvis remain patent. On the delayed images the portal venous system appears patent. . . No early arterial contrast extravasation into the bowel is identified.however, on only the delayed images there does appear to be a small volume of puddling blood or contrast within the stomach along the greater curve series 13 image 26. LIVER: The liver is unremarkable. GALLBLADDER AND BILE DUCTS: Gallbladder is unremarkable. No biliary ductal dilatation. Vicarious contrast excretion to the gallbladder. SPLEEN: The spleen is unremarkable. PANCREAS: The pancreas is unremarkable. ADRENAL GLANDS: Chronically abnormal left adrenal gland has both low density and calcified components stable since September, and reportedly no new findings have been obtained so far from a 2016 CT suggesting benign etiology (no follow up imaging recommended). Right adrenal gland  demonstrates no acute abnormality. KIDNEYS, URETERS AND BLADDER: Stable polycystic renal disease which appears benign (no follow up imaging recommended). On the delayed images early renal contrast excretion appears symmetric. No stones in the kidneys or ureters. No hydronephrosis. No perinephric or periureteral stranding. Small volume gas within the urinary bladder, suspicious for gas forming infection unless explained by recent catheterization (series 6 image 199). GI AND BOWEL: Stomach and duodenal sweep demonstrate no acute abnormality. Severe diverticulosis of the sigmoid colon in the left lower quadrant and pelvis. No active inflammation. Moderate diverticulosis of the descending colon. Nondilated bowel. Normal appendix on coronal image (series 6 image 173). No free air or free fluid identified. REPRODUCTIVE: Reproductive organs are unremarkable. PERITONEUM AND RETROPERITONEUM: No ascites or free air. No evidence of retroperitoneal hemorrhage. LUNG BASE: Severe irregularity and atherosclerosis of the visible descending thoracic aorta. Multifocal irregular  mural plaque or thrombus. Caliber of the vessel is stable. Underlying calcified atherosclerosis. Normal heart size. No pericardial or pleural effusion. Mild lung base atelectasis or scarring. LYMPH NODES: No lymphadenopathy. BONES AND SOFT TISSUES: Chronic severe degeneration in the spine superimposed on previous lower lumbar decompression and fusion. Insert bones. No acute soft tissue abnormality. IMPRESSION: 1. CTA suggests slow but active bleeding into the gastric lumen along the great curve. See series 13 image 26. 2. Positive also for Endoleak at or near Endograft graft bifurcation; underlying abdominal aortic aneurysm with native aneurysm sac not significantly diminished since 09/01/2024 (up to 53 mm). 3. Underlying Severe thoracic and abdominal aortic atherosclerosis. major arterial structures remain patent. 4. Small volume intravesical gas, suspicious for gas-forming urinary infection if no recent catheterization. Electronically signed by: Helayne Hurst MD 10/24/2024 08:34 AM EST RP Workstation: [id number]   PERIPHERAL VASCULAR CATHETERIZATION Result Date: 10/21/2024 See surgical note for result.   Microbiology: Results for orders placed or performed during the hospital encounter of 10/24/24  MRSA Next Gen by PCR, Nasal     Status: None   Collection Time: 10/24/24  1:10 PM   Specimen: Nasal Mucosa; Nasal Swab  Result Value Ref Range Status   MRSA by PCR Next Gen NOT DETECTED NOT DETECTED Final    Comment: (NOTE) The GeneXpert MRSA Assay (FDA approved for NASAL specimens only), is one component of a comprehensive MRSA colonization surveillance program. It is not intended to diagnose MRSA infection nor to guide or monitor treatment for MRSA infections. Test performance is not FDA approved in patients less than 33 years old. Performed at [facility], [address] [place], [place] [address]   MRSA Next Gen by PCR, Nasal     Status: None   Collection Time: 10/30/24 11:33 AM    Specimen: Nasal Mucosa; Nasal Swab  Result Value Ref Range Status   MRSA by PCR Next Gen NOT DETECTED NOT DETECTED Final    Comment: (NOTE) The GeneXpert MRSA Assay (FDA approved for NASAL specimens only), is one component of a comprehensive MRSA colonization surveillance program. It is not intended to diagnose MRSA infection nor to guide or monitor treatment for MRSA infections. Test performance is not FDA approved in patients less than 67 years old. Performed at [facility], [address] [place], [place] [address]     Labs: CBC: Recent Labs  Lab 11/03/24 1715 11/04/24 0545 11/05/24 9391 11/06/24 0424 11/08/24 0454 11/10/24 0650  WBC 13.7* 9.9 9.4  --  8.3  --   NEUTROABS 11.3* 7.5 7.1  --   --   --   HGB 8.4*  7.8* 8.4* 8.3* 8.3* 8.7*  HCT 27.3* 24.8* 26.5*  --  26.5*  --   MCV 91.6 89.5 89.8  --  88.3  --   PLT 440* 443* 477*  --  549*  --    Basic Metabolic Panel: Recent Labs  Lab 11/04/24 0545 11/05/24 0608 11/08/24 0454  NA 141 143 143  K 4.0 4.2 3.5  CL 108 111 108  CO2 23 25 24   GLUCOSE 94 108* 104*  BUN 21 21 20   CREATININE 1.11 1.11 1.00  CALCIUM  8.2* 8.3* 8.7*  MG 2.5* 2.6*  --   PHOS 3.5 3.5  --    Liver Function Tests: Recent Labs  Lab 11/04/24 0545 11/05/24 0608 11/08/24 0454  AST 74* 48* 34  ALT 130* 108* 69*  ALKPHOS 130* 127* 107  BILITOT 0.4 0.4 0.3  PROT 5.7* 5.9* 5.8*  ALBUMIN 2.8* 2.9* 3.0*   CBG: No results for input(s): GLUCAP in the last 168 hours.  Discharge time spent: greater than 30 minutes.  Signed: Charlie Patterson, MD [facility] 11/10/2024

## 2024-12-19 ENCOUNTER — Emergency Department

## 2024-12-19 ENCOUNTER — Other Ambulatory Visit: Payer: Self-pay

## 2024-12-19 ENCOUNTER — Inpatient Hospital Stay
Admission: EM | Admit: 2024-12-19 | Discharge: 2024-12-23 | DRG: 065 | Disposition: A | Discharge status: Home / Self Care | Attending: Internal Medicine | Admitting: Internal Medicine

## 2024-12-19 DIAGNOSIS — F17210 Nicotine dependence, cigarettes, uncomplicated: Secondary | ICD-10-CM | POA: Diagnosis present

## 2024-12-19 DIAGNOSIS — Z885 Allergy status to narcotic agent status: Secondary | ICD-10-CM

## 2024-12-19 DIAGNOSIS — R300 Dysuria: Secondary | ICD-10-CM | POA: Diagnosis present

## 2024-12-19 DIAGNOSIS — E785 Hyperlipidemia, unspecified: Secondary | ICD-10-CM | POA: Diagnosis present

## 2024-12-19 DIAGNOSIS — I10 Essential (primary) hypertension: Secondary | ICD-10-CM | POA: Diagnosis present

## 2024-12-19 DIAGNOSIS — Z87442 Personal history of urinary calculi: Secondary | ICD-10-CM

## 2024-12-19 DIAGNOSIS — Z515 Encounter for palliative care: Secondary | ICD-10-CM | POA: Diagnosis not present

## 2024-12-19 DIAGNOSIS — F0393 Unspecified dementia, unspecified severity, with mood disturbance: Secondary | ICD-10-CM | POA: Diagnosis present

## 2024-12-19 DIAGNOSIS — R4189 Other symptoms and signs involving cognitive functions and awareness: Secondary | ICD-10-CM | POA: Diagnosis present

## 2024-12-19 DIAGNOSIS — Z860100 Personal history of colon polyps, unspecified: Secondary | ICD-10-CM | POA: Diagnosis not present

## 2024-12-19 DIAGNOSIS — Z1152 Encounter for screening for COVID-19: Secondary | ICD-10-CM | POA: Diagnosis not present

## 2024-12-19 DIAGNOSIS — I160 Hypertensive urgency: Secondary | ICD-10-CM | POA: Diagnosis present

## 2024-12-19 DIAGNOSIS — K219 Gastro-esophageal reflux disease without esophagitis: Secondary | ICD-10-CM | POA: Diagnosis present

## 2024-12-19 DIAGNOSIS — Z8673 Personal history of transient ischemic attack (TIA), and cerebral infarction without residual deficits: Secondary | ICD-10-CM | POA: Diagnosis not present

## 2024-12-19 DIAGNOSIS — Z66 Do not resuscitate: Secondary | ICD-10-CM | POA: Diagnosis present

## 2024-12-19 DIAGNOSIS — F0394 Unspecified dementia, unspecified severity, with anxiety: Secondary | ICD-10-CM | POA: Diagnosis present

## 2024-12-19 DIAGNOSIS — S065XAA Traumatic subdural hemorrhage with loss of consciousness status unknown, initial encounter: Principal | ICD-10-CM | POA: Diagnosis present

## 2024-12-19 DIAGNOSIS — Z89421 Acquired absence of other right toe(s): Secondary | ICD-10-CM | POA: Diagnosis not present

## 2024-12-19 DIAGNOSIS — R7303 Prediabetes: Secondary | ICD-10-CM | POA: Diagnosis present

## 2024-12-19 DIAGNOSIS — F03918 Unspecified dementia, unspecified severity, with other behavioral disturbance: Secondary | ICD-10-CM | POA: Diagnosis present

## 2024-12-19 DIAGNOSIS — Z79899 Other long term (current) drug therapy: Secondary | ICD-10-CM | POA: Diagnosis not present

## 2024-12-19 DIAGNOSIS — I6202 Nontraumatic subacute subdural hemorrhage: Principal | ICD-10-CM | POA: Diagnosis present

## 2024-12-19 DIAGNOSIS — Z87440 Personal history of urinary (tract) infections: Secondary | ICD-10-CM | POA: Diagnosis not present

## 2024-12-19 DIAGNOSIS — Z8261 Family history of arthritis: Secondary | ICD-10-CM | POA: Diagnosis not present

## 2024-12-19 DIAGNOSIS — Z981 Arthrodesis status: Secondary | ICD-10-CM

## 2024-12-19 DIAGNOSIS — Z800 Family history of malignant neoplasm of digestive organs: Secondary | ICD-10-CM

## 2024-12-19 LAB — COMPREHENSIVE METABOLIC PANEL WITH GFR
ALT: 17 U/L (ref 0–44)
AST: 19 U/L (ref 15–41)
Albumin: 4.5 g/dL (ref 3.5–5.0)
Alkaline Phosphatase: 111 U/L (ref 38–126)
Anion gap: 17 — ABNORMAL HIGH (ref 5–15)
BUN: 19 mg/dL (ref 8–23)
CO2: 20 mmol/L — ABNORMAL LOW (ref 22–32)
Calcium: 9.3 mg/dL (ref 8.9–10.3)
Chloride: 103 mmol/L (ref 98–111)
Creatinine, Ser: 1.07 mg/dL (ref 0.61–1.24)
GFR, Estimated: >60 mL/min
Glucose, Bld: 116 mg/dL — ABNORMAL HIGH (ref 70–99)
Potassium: 3.7 mmol/L (ref 3.5–5.1)
Sodium: 140 mmol/L (ref 135–145)
Total Bilirubin: 0.3 mg/dL (ref 0.0–1.2)
Total Protein: 7 g/dL (ref 6.5–8.1)

## 2024-12-19 LAB — URINALYSIS, ROUTINE W REFLEX MICROSCOPIC
Bilirubin Urine: NEGATIVE
Glucose, UA: NEGATIVE mg/dL
Hgb urine dipstick: NEGATIVE
Ketones, ur: NEGATIVE mg/dL
Leukocytes,Ua: NEGATIVE
Nitrite: NEGATIVE
Protein, ur: NEGATIVE mg/dL
Specific Gravity, Urine: 1.017 (ref 1.005–1.030)
pH: 5 (ref 5.0–8.0)

## 2024-12-19 LAB — CBC
HCT: 38.4 % — ABNORMAL LOW (ref 39.0–52.0)
Hemoglobin: 12 g/dL — ABNORMAL LOW (ref 13.0–17.0)
MCH: 27.9 pg (ref 26.0–34.0)
MCHC: 31.3 g/dL (ref 30.0–36.0)
MCV: 89.3 fL (ref 80.0–100.0)
Platelets: 283 K/uL (ref 150–400)
RBC: 4.3 MIL/uL (ref 4.22–5.81)
RDW: 16.4 % — ABNORMAL HIGH (ref 11.5–15.5)
WBC: 9.4 K/uL (ref 4.0–10.5)
nRBC: 0 % (ref 0.0–0.2)

## 2024-12-19 MED ORDER — LOPERAMIDE HCL 2 MG PO CAPS: 2.0000 mg | ORAL_CAPSULE | ORAL | Status: DC | PRN

## 2024-12-19 MED ORDER — TRAZODONE HCL 50 MG PO TABS
25.0000 mg | ORAL_TABLET | Freq: Every evening | ORAL | Status: DC | PRN
  Administered 2024-12-21 – 2024-12-22 (×2): 25 mg via ORAL
  Filled 2024-12-19 (×2): qty 1

## 2024-12-19 MED ORDER — LABETALOL HCL 5 MG/ML IV SOLN
20.0000 mg | INTRAVENOUS | Status: DC | PRN
  Administered 2024-12-20: 20 mg via INTRAVENOUS
  Filled 2024-12-19: qty 4

## 2024-12-19 MED ORDER — ONDANSETRON HCL 4 MG PO TABS: 4.0000 mg | ORAL_TABLET | Freq: Four times a day (QID) | ORAL | Status: DC | PRN

## 2024-12-19 MED ORDER — ADULT MULTIVITAMIN W/MINERALS CH
1.0000 | ORAL_TABLET | Freq: Every day | ORAL | Status: DC
  Administered 2024-12-21 – 2024-12-23 (×3): 1 via ORAL
  Filled 2024-12-19 (×3): qty 1

## 2024-12-19 MED ORDER — ONDANSETRON HCL 4 MG/2ML IJ SOLN: 4.0000 mg | Freq: Four times a day (QID) | INTRAMUSCULAR | Status: DC | PRN

## 2024-12-19 MED ORDER — ZIPRASIDONE MESYLATE 20 MG IM SOLR
10.0000 mg | Freq: Once | INTRAMUSCULAR | Status: AC
  Administered 2024-12-19: 10 mg via INTRAMUSCULAR
  Filled 2024-12-19: qty 20

## 2024-12-19 MED ORDER — LIDOCAINE 5 % EX PTCH
1.0000 | MEDICATED_PATCH | CUTANEOUS | Status: DC
  Administered 2024-12-21: 1 via TRANSDERMAL
  Filled 2024-12-19 (×4): qty 1

## 2024-12-19 MED ORDER — LORAZEPAM 2 MG/ML IJ SOLN
1.0000 mg | Freq: Once | INTRAMUSCULAR | Status: AC
  Administered 2024-12-19: 1 mg via INTRAMUSCULAR
  Filled 2024-12-19: qty 1

## 2024-12-19 MED ORDER — ZIPRASIDONE MESYLATE 20 MG IM SOLR: 10.0000 mg | Freq: Once | INTRAMUSCULAR | Status: DC

## 2024-12-19 MED ORDER — DONEPEZIL HCL 5 MG PO TABS
10.0000 mg | ORAL_TABLET | Freq: Every day | ORAL | Status: DC
  Administered 2024-12-21 – 2024-12-22 (×2): 10 mg via ORAL
  Filled 2024-12-19 (×4): qty 2

## 2024-12-19 MED ORDER — PANTOPRAZOLE SODIUM 40 MG PO TBEC
40.0000 mg | DELAYED_RELEASE_TABLET | Freq: Two times a day (BID) | ORAL | Status: DC
  Administered 2024-12-21 – 2024-12-23 (×5): 40 mg via ORAL
  Filled 2024-12-19 (×5): qty 1

## 2024-12-19 MED ORDER — VITAMIN B-12 1000 MCG PO TABS
1000.0000 ug | ORAL_TABLET | Freq: Every day | ORAL | Status: DC
  Administered 2024-12-21 – 2024-12-23 (×3): 1000 ug via ORAL
  Filled 2024-12-19 (×3): qty 1

## 2024-12-19 MED ORDER — QUETIAPINE FUMARATE 25 MG PO TABS
50.0000 mg | ORAL_TABLET | Freq: Every day | ORAL | Status: DC
  Administered 2024-12-21 – 2024-12-22 (×2): 50 mg via ORAL
  Filled 2024-12-19 (×2): qty 2

## 2024-12-19 MED ORDER — ACETAMINOPHEN 650 MG RE SUPP: 650.0000 mg | Freq: Four times a day (QID) | RECTAL | Status: DC | PRN

## 2024-12-19 MED ORDER — ENSURE PLUS HIGH PROTEIN PO LIQD
237.0000 mL | Freq: Three times a day (TID) | ORAL | Status: DC
  Administered 2024-12-21 – 2024-12-23 (×7): 237 mL via ORAL

## 2024-12-19 MED ORDER — VITAMIN C 500 MG PO TABS
500.0000 mg | ORAL_TABLET | Freq: Two times a day (BID) | ORAL | Status: DC
  Administered 2024-12-21 – 2024-12-23 (×5): 500 mg via ORAL
  Filled 2024-12-19 (×5): qty 1

## 2024-12-19 MED ORDER — TAMSULOSIN HCL 0.4 MG PO CAPS
0.4000 mg | ORAL_CAPSULE | Freq: Every day | ORAL | Status: DC
  Administered 2024-12-21 – 2024-12-22 (×2): 0.4 mg via ORAL
  Filled 2024-12-19: qty 1

## 2024-12-19 MED ORDER — HYDRALAZINE HCL 20 MG/ML IJ SOLN
10.0000 mg | Freq: Four times a day (QID) | INTRAMUSCULAR | Status: DC | PRN
  Administered 2024-12-20: 10 mg via INTRAVENOUS
  Filled 2024-12-19 (×2): qty 1

## 2024-12-19 MED ORDER — ACETAMINOPHEN 325 MG PO TABS: 650.0000 mg | ORAL_TABLET | Freq: Four times a day (QID) | ORAL | Status: DC | PRN

## 2024-12-19 MED ORDER — SODIUM CHLORIDE 0.9 % IV SOLN: INTRAVENOUS | Status: AC

## 2024-12-19 MED ORDER — RISPERIDONE 1 MG PO TABS: 0.5000 mg | ORAL_TABLET | Freq: Two times a day (BID) | ORAL | Status: DC | PRN

## 2024-12-19 MED ORDER — MAGNESIUM HYDROXIDE 400 MG/5ML PO SUSP: 30.0000 mL | Freq: Every day | ORAL | Status: DC | PRN

## 2024-12-19 NOTE — ED Triage Notes (Addendum)
 Pt to ED via POV from home. Pt resides with wife. Wife reports hx of dementia but having worsening confusion today. Wife reports took pt's BP and is was in the 200s. Pt is seen by PT at home and BP has been running normal. Pt disoriented x4 on arrival. Wife reports normally oriented to self. No falls wife is aware of. Pt does say yes when asked if it burns with urination.   Pt very uncooperative with EKG and blood work in triage. Wife reports hx of being sedated for aggressive and uncooperative behavior.

## 2024-12-19 NOTE — Assessment & Plan Note (Signed)
 No history of fall.  May be secondary to significantly elevated blood pressure.  Neurosurgery is recommending conservative management and outpatient follow-up. - Will monitor BP and keep systolic BP below 160. -Continue to monitor -PT is recommending home health

## 2024-12-19 NOTE — ED Notes (Addendum)
 Attempted to get urine sample at this time. Pt unable. Pt taking off VS monitors.

## 2024-12-19 NOTE — ED Notes (Signed)
 Unable to perform in and out cath at this time. Pt continues to be quite restless, attempting to get out of bed.

## 2024-12-19 NOTE — Assessment & Plan Note (Signed)
 Patient continued to have significant cognitive decline with advanced dementia. - This associated with depression and anxiety. - We will continue Xanax , Seroquel  and Risperdal .

## 2024-12-19 NOTE — Assessment & Plan Note (Signed)
-   Will continue PPI therapy.

## 2024-12-19 NOTE — Assessment & Plan Note (Signed)
 Blood pressure mildly elevated.  Patient was not on any antihypertensives at home. -Adding losartan  - Continue as needed hydralazine  and labetalol 

## 2024-12-19 NOTE — Assessment & Plan Note (Signed)
 Will continue statin therapy

## 2024-12-19 NOTE — ED Notes (Signed)
 Pt repeatedly trying to get out of bed. Redirected multiple times by this RN and wife at bedside .

## 2024-12-19 NOTE — H&P (Signed)
 "        PATIENT NAME: Albert [name]    MR#:  [id number]  DATE OF BIRTH:  10-26-1946  DATE OF ADMISSION:  12/19/2024  PRIMARY CARE PHYSICIAN: Lenon [name] ORN, MD   Patient is coming from: Home  REQUESTING/REFERRING PHYSICIAN: Fernand Blase, MD  CHIEF COMPLAINT:   Chief Complaint  Patient presents with   Hypertension   Altered Mental Status    HISTORY OF PRESENT ILLNESS:  Albert Keeter. is a 79 y.o. male with medical history significant for anxiety, osteoarthritis, GERD, dementia, hypertension, dyslipidemia and urolithiasis, who presented to the ER with acute onset of excessive sleepiness over the last few days with increasing confusion speaking in sentences that do not make sense.  He was recently admitted here for GI bleeding and discharged to rehab and later returned home with his wife.  He has been having dysuria without hematuria or frequency or flank pain.  No nausea or vomiting or abdominal pain.  No paresthesias or focal muscle weakness.  The patient was fairly somnolent and the history was obtained from his wife.  His confusion has been worse since 4 PM today.  No report cough or wheezing or dyspnea.  No reported chest pain or palpitations.  No ported headache or dizziness or blurred vision.  ED Course: When the patient came to the ER, BP was 190/104 with otherwise normal vital signs.  Labs were-year-old with a CO2 of 20 and anion gap of 17 with glucose of 116 with otherwise unremarkable CMP.  CBC showed hemoglobin of 12 and hematocrit 38.4 above previous levels.  UA came back negative EKG as reviewed by me : EKG was suboptimal with undetermined rhythm likely sinus tachycardia with a rate of 109. Imaging: Noncontrast head CT revealed the following: 1. Subacute subdural hematoma overlying the left cerebral convexity measuring up to 1.2 cm in maximal thickness. Mass effect on the subjacent left cerebral hemisphere with 4 mm left-to-right shift.  No hydrocephalus or trapping. 2. Age-related cerebral atrophy with moderate chronic microvascular ischemic disease, with remote right thalamic lacunar infarct.  The patient was given 1 mg of IM Ativan  and 10 mg of IM Geodon .  Contact was made with Dr. Claudene with neurosurgery.  He will be admitted to a progressive unit bed for further evaluation and management. PAST MEDICAL HISTORY:   Past Medical History:  Diagnosis Date   Anemia    Anxiety    a.) on BZO (alproazolam) PRN   Arthritis of both knees    Back pain    Barrett esophagus    Chronic back pain    Dementia (HCC)    arising in the senium and presenium   Displaced fracture of proximal phalanx of right lesser toe(s), initial encounter for closed fracture    Diverticulosis    GERD (gastroesophageal reflux disease)    OTC meds as needed   Headache    History of colon polyps    benign    History of kidney stones    History of migraine    Hyperlipidemia    Hypertension    Insomnia    Joint pain    Nephrolithiasis    Pre-diabetes    Stroke (HCC)    without residual deficits    PAST SURGICAL HISTORY:   Past Surgical History:  Procedure Laterality Date   AMPUTATION TOE Right 04/13/2022   Procedure: 28820 - AMPUTATION TOE;  Surgeon: Ashley Soulier, DPM;  Location: [facility] ORS;  Service: Podiatry;  Laterality: Right;  ANKLE ARTHROSCOPY Right 04/13/2022   Procedure: A-SCOPE/OCD REPAIR;  Surgeon: Ashley Soulier, DPM;  Location: [facility] ORS;  Service: Podiatry;  Laterality: Right;   ANKLE SURGERY Right 1997   ankles/screws    ARTHROSCOPIC REPAIR ACL Right 1993   BACK SURGERY  2017   titanium rods and pins in lower back   COLONOSCOPY     ENDOVASCULAR STENT GRAFT (AAA) N/A 10/21/2024   Procedure: ENDOVASCULAR STENT GRAFT (AAA);  Surgeon: Marea Selinda RAMAN, MD;  Location: [facility] INVASIVE CV LAB;  Service: Cardiovascular;  Laterality: N/A;   EPIDIDYMECTOMY Left 04/10/2017   Procedure: EPIDIDYMECTOMY;  Surgeon: Rosina Riis, MD;   Location: [facility] ORS;  Service: Urology;  Laterality: Left;   ESOPHAGOGASTRODUODENOSCOPY     ESOPHAGOGASTRODUODENOSCOPY N/A 10/24/2024   Procedure: EGD (ESOPHAGOGASTRODUODENOSCOPY);  Surgeon: Onita Elspeth Sharper, DO;  Location: [facility] ENDOSCOPY;  Service: Gastroenterology;  Laterality: N/A;   ESOPHAGOGASTRODUODENOSCOPY N/A 10/25/2024   Procedure: EGD (ESOPHAGOGASTRODUODENOSCOPY);  Surgeon: Onita Elspeth Sharper, DO;  Location: [facility] ENDOSCOPY;  Service: Gastroenterology;  Laterality: N/A;  in ICU   IR EMBO ART  VEN HEMORR LYMPH EXTRAV  INC GUIDE ROADMAPPING  10/24/2024   LUMBAR FUSION  March 13, 2016   L4/L5 fusion   MINOR HARDWARE REMOVAL Right 04/13/2022   Procedure: REMOVAL SCREW; DEEP;  Surgeon: Ashley Soulier, DPM;  Location: [facility] ORS;  Service: Podiatry;  Laterality: Right;   TONSILLECTOMY  1962   adenoidectomy    VASECTOMY  1973   WOUND DEBRIDEMENT Right 04/13/2022   Procedure: 29906 - SUBTALAR ARTHROSCOPY;  Surgeon: Ashley Soulier, DPM;  Location: [facility] ORS;  Service: Podiatry;  Laterality: Right;    SOCIAL HISTORY:   Social History   Tobacco Use   Smoking status: Every Day    Current packs/day: 0.50    Average packs/day: 0.5 packs/day for 50.0 years (25.0 ttl pk-yrs)    Types: Cigarettes   Smokeless tobacco: Never   Tobacco comments:    Half a pack a day, per wife.  Substance Use Topics   Alcohol use: No    FAMILY HISTORY:   Family History  Problem Relation Age of Onset   Arthritis Mother    Esophageal cancer Father    Prostate cancer Neg Hx    Bladder Cancer Neg Hx    Kidney cancer Neg Hx     DRUG ALLERGIES:  Allergies[1]  REVIEW OF SYSTEMS:   ROS As per history of present illness. All pertinent systems were reviewed above. Constitutional, HEENT, cardiovascular, respiratory, GI, GU, musculoskeletal, neuro, psychiatric, endocrine, integumentary and hematologic systems were reviewed and are otherwise negative/unremarkable except for positive findings mentioned above  in the HPI.   MEDICATIONS AT HOME:   Prior to Admission medications  Medication Sig Start Date End Date Taking? Authorizing Provider  acetaminophen  (TYLENOL ) 325 MG tablet Take 650 mg by mouth every 8 (eight) hours as needed for moderate pain.    [provider]  ascorbic acid  (VITAMIN C ) 500 MG tablet Take 1 tablet (500 mg total) by mouth 2 (two) times daily. 11/10/24   Josette Ade, MD  cyanocobalamin  (VITAMIN B12) 1000 MCG tablet Take 1,000 mcg by mouth.  Take 1,000 mcg by mouth once daily    [provider]  donepezil  (ARICEPT ) 10 MG tablet Take 10 mg by mouth.  TAKE 1 TABLET(10 MG) BY MOUTH AT BEDTIME 04/23/24   [provider]  feeding supplement (ENSURE PLUS HIGH PROTEIN) LIQD Take 237 mLs by mouth 3 (three) times daily between meals. 11/10/24   Josette Ade,  MD  lidocaine  (LIDODERM ) 5 % Remove & Discard patch within 12 hours or as directed by MD. Apply to area of pain 11/10/24   Josette Ade, MD  loperamide  (IMODIUM ) 2 MG capsule Take 1 capsule (2 mg total) by mouth as needed for diarrhea or loose stools. 11/10/24   Josette Ade, MD  Multiple Vitamin (MULTIVITAMIN WITH MINERALS) TABS tablet Take 1 tablet by mouth daily.    [provider]  pantoprazole  (PROTONIX ) 40 MG tablet Take 1 tablet (40 mg total) by mouth 2 (two) times daily. 11/10/24   Josette Ade, MD  QUEtiapine  (SEROQUEL ) 50 MG tablet Take 1 tablet (50 mg total) by mouth at bedtime. 11/10/24   Josette Ade, MD  risperiDONE  (RISPERDAL ) 0.5 MG tablet Take 1 tablet (0.5 mg total) by mouth 2 (two) times daily as needed (agitation). 11/10/24   Josette Ade, MD  tamsulosin  (FLOMAX ) 0.4 MG CAPS capsule Take 1 capsule (0.4 mg total) by mouth daily after supper. 11/10/24   Josette Ade, MD      VITAL SIGNS:  Blood pressure (!) 146/121, pulse 78, temperature 98.1 F (36.7 C), resp. rate 17, SpO2 98%.  PHYSICAL EXAMINATION:  Physical Exam  GENERAL:  79  y.o.-year-old patient lying in the bed with no acute distress.  The patient was fairly somnolent and difficult to arouse after being medicated for agitation. EYES: Pupils equal, round, reactive to light and accommodation. No scleral icterus. Extraocular muscles intact.  HEENT: Head atraumatic, normocephalic. Oropharynx and nasopharynx clear.  NECK:  Supple, no jugular venous distention. No thyroid  enlargement, no tenderness.  LUNGS: Normal breath sounds bilaterally, no wheezing, rales,rhonchi or crepitation. No use of accessory muscles of respiration.  CARDIOVASCULAR: Regular rate and rhythm, S1, S2 normal. No murmurs, rubs, or gallops.  ABDOMEN: Soft, nondistended, nontender. Bowel sounds present. No organomegaly or mass.  EXTREMITIES: No pedal edema, cyanosis, or clubbing.  NEUROLOGIC: Cranial nerves II through XII are intact. Muscle strength 5/5 in all extremities. Sensation intact. Gait not checked.  PSYCHIATRIC: The patient is somnolent and difficult to arouse.   SKIN: No obvious rash, lesion, or ulcer.   LABORATORY PANEL:   CBC Recent Labs  Lab 12/19/24 1728  WBC 9.4  HGB 12.0*  HCT 38.4*  PLT 283   ------------------------------------------------------------------------------------------------------------------  Chemistries  Recent Labs  Lab 12/19/24 1728  NA 140  K 3.7  CL 103  CO2 20*  GLUCOSE 116*  BUN 19  CREATININE 1.07  CALCIUM  9.3  AST 19  ALT 17  ALKPHOS 111  BILITOT 0.3   ------------------------------------------------------------------------------------------------------------------  Cardiac Enzymes No results for input(s): TROPONINI in the last 168 hours. ------------------------------------------------------------------------------------------------------------------  RADIOLOGY:  CT HEAD WO CONTRAST Result Date: 12/19/2024 CLINICAL DATA:  Initial evaluation for acute altered mental status. EXAM: CT HEAD WITHOUT CONTRAST TECHNIQUE: Contiguous  axial images were obtained from the base of the skull through the vertex without intravenous contrast. RADIATION DOSE REDUCTION: This exam was performed according to the departmental dose-optimization program which includes automated exposure control, adjustment of the mA and/or kV according to patient size and/or use of iterative reconstruction technique. COMPARISON:  Prior study from 06/24/2024. FINDINGS: Brain: Age-related cerebral atrophy with moderate chronic microvascular ischemic disease. Remote right thalamic lacunar infarct. No acute large vessel territory infarct. Mixed density subdural hematoma overlying the left cerebral convexity measures up to 1.2 cm in maximal thickness, consistent with a subacute subdural hematoma. Mass effect on the subjacent left cerebral hemisphere with 4 mm left-to-right shift. No hydrocephalus or trapping. Vascular: No abnormal  hyperdense vessel. Calcified atherosclerosis present at skull base. Skull: Scalp soft tissues demonstrate no acute finding. Calvarium intact. Sinuses/Orbits: Globes and orbital soft tissues within normal limits. Mild mucosal thickening about the sphenoid ethmoidal sinuses. Moderate right mastoid effusion. Left mastoid air cells are clear. Other: None. IMPRESSION: 1. Subacute subdural hematoma overlying the left cerebral convexity measuring up to 1.2 cm in maximal thickness. Mass effect on the subjacent left cerebral hemisphere with 4 mm left-to-right shift. No hydrocephalus or trapping. 2. Age-related cerebral atrophy with moderate chronic microvascular ischemic disease, with remote right thalamic lacunar infarct. Critical Value/emergent results were called by telephone at the time of interpretation on 12/19/2024 at 8:44 pm to provider [facility] , who verbally acknowledged these results. Electronically Signed   By: Morene Hoard M.D.   On: 12/19/2024 20:45      IMPRESSION AND PLAN:  Assessment and Plan: * Subdural hematoma (HCC) - The  patient will be admitted to a progressive unit bed. - Will follow neurochecks every 4 hours for 24 hours. - Will monitor BP and keep systolic BP below 160. - Neurosurgery consult will be obtained. - Dr. Claudene was notified and is aware about the patient.  Hypertensive urgency - The patient will be continued on his antihypertensive therapy. - We will add as needed IV labetalol  and hydralazine  for optimal BP control.  Dyslipidemia - Will continue statin therapy.  Dementia with behavioral disturbance (HCC) - This associated with depression and anxiety. - We will continue Xanax , Seroquel  and Risperdal .  GERD without esophagitis - Will continue PPI therapy.   DVT prophylaxis: SCDs. Advanced Care Planning:  Code Status: Patient is DNR and DNI.  This was discussed with his wife Family Communication:  The plan of care was discussed in details with the patient (and family). I answered all questions. The patient agreed to proceed with the above mentioned plan. Further management will depend upon hospital course. Disposition Plan: Back to previous home environment Consults called: Neurosurgery. All the records are reviewed and case discussed with ED provider.  Status is: Inpatient  At the time of the admission, it appears that the appropriate admission status for this patient is inpatient.  This is judged to be reasonable and necessary in order to provide the required intensity of service to ensure the patient's safety given the presenting symptoms, physical exam findings and initial radiographic and laboratory data in the context of comorbid conditions.  The patient requires inpatient status due to high intensity of service, high risk of further deterioration and high frequency of surveillance required.  I certify that at the time of admission, it is my clinical judgment that the patient will require inpatient hospital care extending more than 2 midnights.                            Dispo:  The patient is from: Home              Anticipated d/c is to: Home              Patient currently is not medically stable to d/c.              Difficult to place patient: No  Madison DELENA Peaches M.D on 12/20/2024 at 12:34 AM  [facility]   From 7 PM-7 AM, contact night-coverage [email or web address]  CC: Primary care physician; Lenon [name] ORN, MD     [1]  Allergies Allergen Reactions   Codeine Nausea And  Vomiting   "

## 2024-12-19 NOTE — ED Provider Notes (Signed)
 "  [facility] Provider Note    Event Date/Time   First MD Initiated Contact with Patient 12/19/24 1739     (approximate)   History   Hypertension and Altered Mental Status   HPI  Albert Tosh. is a 79 y.o. male presenting with concern of altered mental status.  I reviewed his admission from November, seems that he was recently admitted for a GI bleed, was able to be discharged to rehab and then returned home with his wife.  Generally he is alert to himself, and is able to have conversation although does get confused at times, it seems over the last few days has been sleeping more frequently and having increased confusion, wife states that he continues to speak but sentences do not make sense.  He was apparently complaining of some burning with urination and he does have a history of urinary tract infections.  Seems to have been a gradual onset but seems to have been more pronounced around 4 PM today.     Physical Exam   Triage Vital Signs: ED Triage Vitals [12/19/24 1726]  Encounter Vitals Group     BP (!) 190/104     Girls Systolic BP Percentile      Girls Diastolic BP Percentile      Boys Systolic BP Percentile      Boys Diastolic BP Percentile      Pulse Rate 89     Resp 20     Temp 98 F (36.7 C)     Temp Source Axillary     SpO2 98 %     Weight      Height      Head Circumference      Peak Flow      Pain Score      Pain Loc      Pain Education      Exclude from Growth Chart     Most recent vital signs: Vitals:   12/19/24 1726 12/19/24 2000  BP: (!) 190/104 (!) 146/121  Pulse: 89 78  Resp: 20 17  Temp: 98 F (36.7 C) 98.1 F (36.7 C)  SpO2: 98% 98%     General: Awake, no distress.  CV:  Good peripheral perfusion.  Resp:  Normal effort.  Abd:  No distention.  No discomfort to palpation along the lower abdomen Neuro:  Alert but disoriented, repeatedly trying to get out of bed and requires verbal cues to remind him to stay in  bed Other:     ED Results / Procedures / Treatments   Labs (all labs ordered are listed, but only abnormal results are displayed) Labs Reviewed  COMPREHENSIVE METABOLIC PANEL WITH GFR - Abnormal; Notable for the following components:      Result Value   CO2 20 (*)    Glucose, Bld 116 (*)    Anion gap 17 (*)    All other components within normal limits  CBC - Abnormal; Notable for the following components:   Hemoglobin 12.0 (*)    HCT 38.4 (*)    RDW 16.4 (*)    All other components within normal limits  URINALYSIS, ROUTINE W REFLEX MICROSCOPIC - Abnormal; Notable for the following components:   Color, Urine YELLOW (*)    APPearance CLEAR (*)    All other components within normal limits  BASIC METABOLIC PANEL WITH GFR  CBC  CBG MONITORING, ED     EKG  Appears to be a sinus rhythm with a  rate of about 110, axis of 0, intervals appear to be within normal limits, no obvious ischemia although given the significant artifact difficult to appropriately interpret   RADIOLOGY   PROCEDURES:  Critical Care performed: No  Procedures   MEDICATIONS ORDERED IN ED: Medications  ziprasidone  (GEODON ) injection 10 mg (10 mg Intramuscular Not Given 12/19/24 2030)  donepezil  (ARICEPT ) tablet 10 mg (has no administration in time range)  QUEtiapine  (SEROQUEL ) tablet 50 mg (has no administration in time range)  risperiDONE  (RISPERDAL ) tablet 0.5 mg (has no administration in time range)  tamsulosin  (FLOMAX ) capsule 0.4 mg (has no administration in time range)  pantoprazole  (PROTONIX ) EC tablet 40 mg (has no administration in time range)  loperamide  (IMODIUM ) capsule 2 mg (has no administration in time range)  cyanocobalamin  (VITAMIN B12) tablet 1,000 mcg (has no administration in time range)  ascorbic acid  (VITAMIN C ) tablet 500 mg (has no administration in time range)  feeding supplement (ENSURE PLUS HIGH PROTEIN) liquid 237 mL (has no administration in time range)  multivitamin with  minerals tablet 1 tablet (has no administration in time range)  lidocaine  (LIDODERM ) 5 % 1 patch (has no administration in time range)  0.9 %  sodium chloride  infusion (has no administration in time range)  acetaminophen  (TYLENOL ) tablet 650 mg (has no administration in time range)    Or  acetaminophen  (TYLENOL ) suppository 650 mg (has no administration in time range)  traZODone  (DESYREL ) tablet 25 mg (has no administration in time range)  magnesium  hydroxide (MILK OF MAGNESIA) suspension 30 mL (has no administration in time range)  ondansetron  (ZOFRAN ) tablet 4 mg (has no administration in time range)    Or  ondansetron  (ZOFRAN ) injection 4 mg (has no administration in time range)  ziprasidone  (GEODON ) injection 10 mg (10 mg Intramuscular Given 12/19/24 1816)  LORazepam  (ATIVAN ) injection 1 mg (1 mg Intramuscular Given 12/19/24 1921)     IMPRESSION / MDM / ASSESSMENT AND PLAN / ED COURSE  I reviewed the triage vital signs and the nursing notes.                               Patient's presentation is most consistent with acute complicated illness / injury requiring diagnostic workup.  79 year old male who presents today with concern of altered mental status.  He is hypertensive here apparently having increased hypertension over the last few days.  He does not appear to be in any acute distress although he is disoriented apparently worsened from his mental baseline.  He lives at home with his wife, who is having trouble caring for him, concern of a possible UTI here.  Will follow-up labs and imaging and may warrant admission due to inability to care for the patient at home.   Clinical Course as of 12/19/24 2242  Sat Dec 19, 2024  8086 Patient continued to be restless and agitated despite dose of Geodon .  Will give a second dose here and also add in some Ativan . [SK]  2059 I spoke with Dr. Jeni from radiology informing me of a subacute subdural, will reach out to neurosurgery for  recommendations at this time. [SK]  2108 I spoke with Dr. Claudene from neurosurgery who reviewed imaging, will assess the patient in the morning to determine if needs to be emergently drained, but for now recommends searching for other possible sources including urinalysis possibly polypharmacy. [SK]  2156 Urinalysis without clear explanation of the symptoms which makes me more  concerned that this may be secondary to the subacute subdural.  Reaching out to medicine at this time to discuss admission. [SK]  2242 Spoke with Dr. Lawence who has agreed to evaluate the patient determine course for further medical management at this time. [SK]    Clinical Course User Index [SK] Fernand Rossie HERO, MD     FINAL CLINICAL IMPRESSION(S) / ED DIAGNOSES   Final diagnoses:  Subacute subdural hematoma (HCC)     Rx / DC Orders   ED Discharge Orders     None        Note:  This document was prepared using Dragon voice recognition software and may include unintentional dictation errors.   Fernand Rossie HERO, MD 12/19/24 2242  "

## 2024-12-20 ENCOUNTER — Encounter: Payer: Self-pay | Admitting: Family Medicine

## 2024-12-20 DIAGNOSIS — F03918 Unspecified dementia, unspecified severity, with other behavioral disturbance: Secondary | ICD-10-CM

## 2024-12-20 DIAGNOSIS — I160 Hypertensive urgency: Secondary | ICD-10-CM

## 2024-12-20 DIAGNOSIS — E785 Hyperlipidemia, unspecified: Secondary | ICD-10-CM

## 2024-12-20 DIAGNOSIS — K219 Gastro-esophageal reflux disease without esophagitis: Secondary | ICD-10-CM | POA: Diagnosis not present

## 2024-12-20 DIAGNOSIS — S065XAA Traumatic subdural hemorrhage with loss of consciousness status unknown, initial encounter: Secondary | ICD-10-CM

## 2024-12-20 LAB — BASIC METABOLIC PANEL WITH GFR
Anion gap: 14 (ref 5–15)
BUN: 14 mg/dL (ref 8–23)
CO2: 22 mmol/L (ref 22–32)
Calcium: 9.7 mg/dL (ref 8.9–10.3)
Chloride: 104 mmol/L (ref 98–111)
Creatinine, Ser: 1.06 mg/dL (ref 0.61–1.24)
GFR, Estimated: >60 mL/min
Glucose, Bld: 101 mg/dL — ABNORMAL HIGH (ref 70–99)
Potassium: 3.8 mmol/L (ref 3.5–5.1)
Sodium: 140 mmol/L (ref 135–145)

## 2024-12-20 LAB — CBC
HCT: 37.3 % — ABNORMAL LOW (ref 39.0–52.0)
Hemoglobin: 12 g/dL — ABNORMAL LOW (ref 13.0–17.0)
MCH: 28.3 pg (ref 26.0–34.0)
MCHC: 32.2 g/dL (ref 30.0–36.0)
MCV: 88 fL (ref 80.0–100.0)
Platelets: 269 K/uL (ref 150–400)
RBC: 4.24 MIL/uL (ref 4.22–5.81)
RDW: 16.4 % — ABNORMAL HIGH (ref 11.5–15.5)
WBC: 8.2 K/uL (ref 4.0–10.5)
nRBC: 0 % (ref 0.0–0.2)

## 2024-12-20 MED ORDER — LOSARTAN POTASSIUM 50 MG PO TABS
50.0000 mg | ORAL_TABLET | Freq: Every day | ORAL | Status: DC
  Administered 2024-12-21 – 2024-12-23 (×3): 50 mg via ORAL
  Filled 2024-12-20 (×3): qty 1

## 2024-12-20 MED ORDER — ORAL CARE MOUTH RINSE: 15.0000 mL | OROMUCOSAL | Status: DC | PRN

## 2024-12-20 MED ORDER — HALOPERIDOL LACTATE 5 MG/ML IJ SOLN
1.0000 mg | Freq: Four times a day (QID) | INTRAMUSCULAR | Status: DC | PRN
  Administered 2024-12-21 (×2): 2 mg via INTRAMUSCULAR
  Filled 2024-12-20 (×2): qty 1

## 2024-12-20 MED ORDER — ZIPRASIDONE MESYLATE 20 MG IM SOLR
10.0000 mg | Freq: Four times a day (QID) | INTRAMUSCULAR | Status: DC | PRN
  Administered 2024-12-22: 10 mg via INTRAMUSCULAR
  Filled 2024-12-20 (×3): qty 20

## 2024-12-20 NOTE — ED Notes (Signed)
 Fall risk bundle is currently in place.

## 2024-12-20 NOTE — ED Notes (Incomplete)
 In to check on pt.

## 2024-12-20 NOTE — ED Notes (Signed)
 IV placed. Mitts applied.

## 2024-12-20 NOTE — ED Notes (Signed)
 Pt bed alarm going off at this time. Pt sitting at end of bed attempting to get up. Unable to redirect. Visual merchandiser for additional help.

## 2024-12-20 NOTE — Progress Notes (Signed)
" ° °  Brief Progress Note   _____________________________________________________________________________________________________________  Patient Name: Albert [name] Patient DOB: 03-Oct-1946 Date: @TODAY @      Data: Reviewed labs, notes, VS.     Action: No action needed at this time.      Response:    _____________________________________________________________________________________________________________  [facility] RN Expeditor Sharolyn JONETTA Batman Please contact us  directly via secure chat (search for [facility]) or by calling us  at [phone] [facility]).  "

## 2024-12-20 NOTE — Progress Notes (Signed)
 Patient disoriented x4.  Sitter at bedside.  Mittens in place and IVF infusing.  Patient currently NPO.  NSR on telemetry.

## 2024-12-20 NOTE — ED Notes (Signed)
 Bed alarm going off at this time. Upon entering rm pt is sitting on side of the bed with leg through the side rail. Pt redirected to lay back down in the bed. Pt stating that he needs to urinate. Urinal used but pt unable to use the bathroom. Pt states he urinated. Brief showing some urine. Brief changed.

## 2024-12-20 NOTE — Hospital Course (Addendum)
 Partly taken from H&P.  Albert [name] is a 79 y.o. male with medical history significant for anxiety, osteoarthritis, GERD, dementia, hypertension, dyslipidemia and urolithiasis, who presented to the ER with acute onset of excessive sleepiness over the last few days with increasing confusion speaking in sentences that do not make sense.  He was recently admitted here for GI bleeding and discharged to rehab and later returned home with his wife.  He has been having dysuria.  On presentation blood pressure elevated at 190/104, labs with CO2 of 20, anion gap of 17, CBC with hemoglobin of 12.  UA negative for UTI. CT head with a subacute subdural hematoma overlying the left cerebral convexity measuring up to 1.2 cm with some mass effect and 4 mm left-to-right shift, also noted age-related cerebral atrophy with moderate chronic microvascular disease.  Remote right thalamic lacunar infarct.  Neurosurgery was consulted.  1/4: Vital and labs stable, history of advanced dementia with significant cognitive impairment at baseline.  PT is recommending home health. Wife to discuss with son regarding our offer about palliative care, stating that son at this time does not want them to get involved. Neurosurgery is recommending conservative management with outpatient follow-up.  1/5: Overnight agitation with history of advanced dementia.  Still appears confused, likely close to his baseline.  Repeating CT head to see the stability.  Blood pressure still mildly elevated, losartan  was added yesterday, adding amlodipine  5 mg daily-might need dose titration.  Apparently his antihypertensives were stopped for unknown reason.  Goal blood pressure less than 140.  1/6: Hemodynamically stable, systolic blood pressure at 143 this morning, we have made his amlodipine  10 mg daily and he will continue with current dose of losartan .  Wife was instructed to keep taking his blood pressure to keep it below 140.  He needed close  follow-up with primary care provider for better control of his hypertension.  Apparently has stopped taking home antihypertensives.  Repeat CT head with stable subdural hematoma and 4 mm midline shift.  No new changes.  Patient need to follow-up with neurosurgery as outpatient for further recommendations and management of his subdural hematoma.  Patient with significant cognitive impairment with advanced dementia.  Home health services was ordered to help wife to take care of him at home.  Patient will continue on current medications and need to have a close follow-up with his providers for further assistance.  Patient is high risk for readmission and mortality based on his age and significant underlying comorbidities.

## 2024-12-20 NOTE — ED Notes (Signed)
 Pt back in bed and resting comfortably. Fall precautions intact. Fall pads placed at this time.

## 2024-12-20 NOTE — Progress Notes (Signed)
 " Progress Note   Patient: Albert [name] FMW:[id number] DOB: 1946/01/11 DOA: 12/19/2024     1 DOS: the patient was seen and examined on 12/20/2024   Brief hospital course: Partly taken from H&P.  Albert [name] is a 79 y.o. male with medical history significant for anxiety, osteoarthritis, GERD, dementia, hypertension, dyslipidemia and urolithiasis, who presented to the ER with acute onset of excessive sleepiness over the last few days with increasing confusion speaking in sentences that do not make sense.  He was recently admitted here for GI bleeding and discharged to rehab and later returned home with his wife.  He has been having dysuria.  On presentation blood pressure elevated at 190/104, labs with CO2 of 20, anion gap of 17, CBC with hemoglobin of 12.  UA negative for UTI. CT head with a subacute subdural hematoma overlying the left cerebral convexity measuring up to 1.2 cm with some mass effect and 4 mm left-to-right shift, also noted age-related cerebral atrophy with moderate chronic microvascular disease.  Remote right thalamic lacunar infarct.  Neurosurgery was consulted.  1/4: Vital and labs stable, history of advanced dementia with significant cognitive impairment at baseline.  PT is recommending home health. Wife to discuss with son regarding our offer about palliative care, stating that son at this time does not want them to get involved. Neurosurgery is recommending conservative management with outpatient follow-up.  Assessment and Plan: * Subdural hematoma (HCC) No history of fall.  May be secondary to significantly elevated blood pressure.  Neurosurgery is recommending conservative management and outpatient follow-up. - Will monitor BP and keep systolic BP below 160. -Continue to monitor -PT is recommending home health  Hypertensive urgency Blood pressure mildly elevated.  Patient was not on any antihypertensives at home. -Adding losartan  - Continue as needed  hydralazine  and labetalol   Dyslipidemia - Will continue statin therapy.  Dementia with behavioral disturbance [facility]) Patient continued to have significant cognitive decline with advanced dementia. - This associated with depression and anxiety. - We will continue Xanax , Seroquel  and Risperdal .  GERD without esophagitis - Will continue PPI therapy.      Subjective: Patient still quite confused and oriented to self only.  Denies any pain.  Per wife worsening cognition with waxing and waning mentation for a while.  Sometimes he recognizes her.  We discussed about palliative care, patient will discuss with son as he did not like the palliative care approach during most recent hospitalization but wife seems interested in hospice at home.  Physical Exam: Vitals:   12/20/24 0900 12/20/24 1245 12/20/24 1250 12/20/24 1251  BP: (!) 146/89  (!) 153/91   Pulse: 67 78 79   Resp: 19 (!) 21 20   Temp:    (!) 97.5 F (36.4 C)  TempSrc:    Axillary  SpO2: 100% 100% 100%    General.  Frail and confused elderly man, in no acute distress. Pulmonary.  Lungs clear bilaterally, normal respiratory effort. CV.  Regular rate and rhythm, no JVD, rub or murmur. Abdomen.  Soft, nontender, nondistended, BS positive. CNS.  Alert and oriented to name only.  No focal neurologic deficit. Extremities.  No edema, pulses intact and symmetrical. Psychiatry.  Judgment and insight appears impaired.  Data Reviewed: Prior data reviewed  Family Communication: Discussed with wife at bedside  Disposition: Status is: Inpatient Remains inpatient appropriate because: Severity of illness  Planned Discharge Destination: Home with Home Health  DVT prophylaxis.  SCDs Time spent: 50 minutes  This  record has been created using Conservation officer, historic buildings. Errors have been sought and corrected,but may not always be located. Such creation errors do not reflect on the standard of care.   Author: Amaryllis Dare,  MD 12/20/2024 4:04 PM  For on call review [email or web address].  "

## 2024-12-20 NOTE — ED Notes (Signed)
 Pt brief changed at this time. Noted that pt urinated.

## 2024-12-20 NOTE — ED Notes (Signed)
 Pt tolerating monitoring equipment at this time. Pt moved into a hospital bed for increase in comfort. Pt currently resting comfortably with family at bedside.

## 2024-12-20 NOTE — Evaluation (Signed)
 Physical Therapy Evaluation Patient Details Name: Albert [name] MRN: [id number] DOB: Dec 01, 1946 Today's Date: 12/20/2024  History of Present Illness  Albert [name] is a 79 y.o. male with medical history significant for anxiety, osteoarthritis, GERD, dementia, hypertension, dyslipidemia and urolithiasis, who presented to the ER with acute onset of excessive sleepiness over the last few days with increasing confusion speaking in sentences that do not make sense.  Clinical Impression  Patient noted to be in supine position at PT arrival in room, for an initial PT evaluation due to a decline in functional status, with baseline mobility reported as modI with [facility] for mobility, while needing assistance with ADL/iADLs from wife, and currently requiring modA to stand at bedside. Progressive mobility held due to elevated BP reading of 175/115 mmHg (133). The patient has significant cognitive impairment at baseline. The patient resides in a apartment and lives with wife. There are no STE inside the residence. The overall clinical impression is that the patient presents with moderate mobility limitations. Recommended skilled PT will address safety, mobility, and discharge planning.        If plan is discharge home, recommend the following: A lot of help with walking and/or transfers;A lot of help with bathing/dressing/bathroom;Help with stairs or ramp for entrance;Direct supervision/assist for medications management;Assistance with feeding;Assist for transportation;Direct supervision/assist for financial management;Assistance with cooking/housework;Supervision due to cognitive status   Can travel by private vehicle        Equipment Recommendations Rolling walker (2 wheels);Wheelchair (measurements PT)  Recommendations for Other Services       Functional Status Assessment Patient has had a recent decline in their functional status and demonstrates the ability to make significant improvements in  function in a reasonable and predictable amount of time.     Precautions / Restrictions Precautions Precautions: Fall Restrictions Weight Bearing Restrictions Per Provider Order: No      Mobility  Bed Mobility Overal bed mobility: Needs Assistance Bed Mobility: Supine to Sit, Sit to Supine     Supine to sit: Mod assist Sit to supine: Mod assist   General bed mobility comments: +2 to reposition in bed; physical assistance to sit EOB    Transfers Overall transfer level: Needs assistance Equipment used: Rolling walker (2 wheels) Transfers: Sit to/from Stand Sit to Stand: Mod assist           General transfer comment: vc to stand at bedside; step by step sequence and multimodal vc needed; after standing at bedside transition to seated position; vitals assessed BP reading 175/115 mmHg  (133) noted; pt returned to bed    Ambulation/Gait                  Stairs            Wheelchair Mobility     Tilt Bed    Modified Rankin (Stroke Patients Only)       Balance Overall balance assessment: Needs assistance Sitting-balance support: Feet supported Sitting balance-Leahy Scale: Fair     Standing balance support: Bilateral upper extremity supported Standing balance-Leahy Scale: Fair                               Pertinent Vitals/Pain Pain Assessment Breathing: occasional labored breathing, short period of hyperventilation Negative Vocalization: occasional moan/groan, low speech, negative/disapproving quality Facial Expression: sad, frightened, frown Body Language: tense, distressed pacing, fidgeting Consolability: distracted or reassured by voice/touch PAINAD Score: 5  Home Living Family/patient expects to be discharged to:: Private residence Living Arrangements: Spouse/significant other Available Help at Discharge: Family;Available 24 hours/day Type of Home: Apartment Home Access: Level entry       Home Layout: One level Home  Equipment: Cane - single point      Prior Function Prior Level of Function : Independent/Modified Independent             Mobility Comments: pt uses cane to amb. No AD otherwise. ADLs Comments: pt requires assistance at baseline for ADLs     Extremity/Trunk Assessment   Upper Extremity Assessment Upper Extremity Assessment: Generalized weakness    Lower Extremity Assessment Lower Extremity Assessment: Generalized weakness    Cervical / Trunk Assessment Cervical / Trunk Assessment: Kyphotic  Communication   Communication Communication: Impaired Factors Affecting Communication: Difficulty expressing self    Cognition Arousal: Alert Behavior During Therapy: Flat affect   PT - Cognitive impairments: History of cognitive impairments                       PT - Cognition Comments: often has little response/reaction to commands; occasional vocal expression of words or phrases that are not completely understandable Following commands: Impaired Following commands impaired: Follows one step commands inconsistently     Cueing Cueing Techniques: Verbal cues, Gestural cues, Tactile cues, Visual cues     General Comments      Exercises     Assessment/Plan    PT Assessment Patient needs continued PT services  PT Problem List Decreased strength;Decreased activity tolerance;Decreased mobility       PT Treatment Interventions Gait training;Functional mobility training;Therapeutic activities;Neuromuscular re-education;Therapeutic exercise;Patient/family education    PT Goals (Current goals can be found in the Care Plan section)  Acute Rehab PT Goals PT Goal Formulation: Patient unable to participate in goal setting    Frequency Min 2X/week     Co-evaluation               AM-PAC PT 6 Clicks Mobility  Outcome Measure Help needed turning from your back to your side while in a flat bed without using bedrails?: None Help needed moving from lying on  your back to sitting on the side of a flat bed without using bedrails?: A Little Help needed moving to and from a bed to a chair (including a wheelchair)?: A Little Help needed standing up from a chair using your arms (e.g., wheelchair or bedside chair)?: A Little Help needed to walk in hospital room?: A Lot Help needed climbing 3-5 steps with a railing? : Total 6 Click Score: 16    End of Session Equipment Utilized During Treatment: Gait belt Activity Tolerance: Other (comment) (limited by elevated BP; RN notified) Patient left: in bed;with call bell/phone within reach;with bed alarm set;with family/visitor present Nurse Communication: Mobility status PT Visit Diagnosis: Other abnormalities of gait and mobility (R26.89);Difficulty in walking, not elsewhere classified (R26.2)    Time: 1331-1405 PT Time Calculation (min) (ACUTE ONLY): 34 min   Charges:   PT Evaluation $PT Eval Low Complexity: 1 Low   PT General Charges $$ ACUTE PT VISIT: 1 Visit         Sherlean Lesches DPT, PT    Sherlean A Leary Mcnulty 12/20/2024, 2:32 PM

## 2024-12-20 NOTE — Consult Note (Signed)
 Consulting Department:  Inpatient medicine  Primary Physician:  Lenon [name] ORN, MD  Chief Complaint: Worsening sensorium  History of Present Illness: 12/20/2024 Albert [name] is a 79 y.o. male who presents with the chief complaint of worsening sensorium.  He has a history of dementia with behavioral disturbances.  Recent admission for GI bleed.  Recent admissions for UTI with mental status changes.  He is having worsening sensorium according to his spouse.  He has not had any recent major traumas but was previously on blood thinners.  He was found to have subdural hematoma on workup.  Review of Systems:  A 10 point review of systems is negative, except for the pertinent positives and negatives detailed in the HPI.  Past Medical History: Past Medical History:  Diagnosis Date   Anemia    Anxiety    a.) on BZO (alproazolam) PRN   Arthritis of both knees    Back pain    Barrett esophagus    Chronic back pain    Dementia (HCC)    arising in the senium and presenium   Displaced fracture of proximal phalanx of right lesser toe(s), initial encounter for closed fracture    Diverticulosis    GERD (gastroesophageal reflux disease)    OTC meds as needed   Headache    History of colon polyps    benign    History of kidney stones    History of migraine    Hyperlipidemia    Hypertension    Insomnia    Joint pain    Nephrolithiasis    Pre-diabetes    Stroke [facility])    without residual deficits    Past Surgical History: Past Surgical History:  Procedure Laterality Date   AMPUTATION TOE Right 04/13/2022   Procedure: 28820 - AMPUTATION TOE;  Surgeon: Ashley Soulier, DPM;  Location: [facility] ORS;  Service: Podiatry;  Laterality: Right;   ANKLE ARTHROSCOPY Right 04/13/2022   Procedure: A-SCOPE/OCD REPAIR;  Surgeon: Ashley Soulier, DPM;  Location: [facility] ORS;  Service: Podiatry;  Laterality: Right;   ANKLE SURGERY Right 1997   ankles/screws    ARTHROSCOPIC REPAIR ACL Right 1993    BACK SURGERY  2017   titanium rods and pins in lower back   COLONOSCOPY     ENDOVASCULAR STENT GRAFT (AAA) N/A 10/21/2024   Procedure: ENDOVASCULAR STENT GRAFT (AAA);  Surgeon: Marea Selinda RAMAN, MD;  Location: [facility] INVASIVE CV LAB;  Service: Cardiovascular;  Laterality: N/A;   EPIDIDYMECTOMY Left 04/10/2017   Procedure: EPIDIDYMECTOMY;  Surgeon: Rosina Riis, MD;  Location: [facility] ORS;  Service: Urology;  Laterality: Left;   ESOPHAGOGASTRODUODENOSCOPY     ESOPHAGOGASTRODUODENOSCOPY N/A 10/24/2024   Procedure: EGD (ESOPHAGOGASTRODUODENOSCOPY);  Surgeon: Onita Elspeth Sharper, DO;  Location: [facility] ENDOSCOPY;  Service: Gastroenterology;  Laterality: N/A;   ESOPHAGOGASTRODUODENOSCOPY N/A 10/25/2024   Procedure: EGD (ESOPHAGOGASTRODUODENOSCOPY);  Surgeon: Onita Elspeth Sharper, DO;  Location: [facility] ENDOSCOPY;  Service: Gastroenterology;  Laterality: N/A;  in ICU   IR EMBO ART  VEN HEMORR LYMPH EXTRAV  INC GUIDE ROADMAPPING  10/24/2024   LUMBAR FUSION  March 13, 2016   L4/L5 fusion   MINOR HARDWARE REMOVAL Right 04/13/2022   Procedure: REMOVAL SCREW; DEEP;  Surgeon: Ashley Soulier, DPM;  Location: [facility] ORS;  Service: Podiatry;  Laterality: Right;   TONSILLECTOMY  1962   adenoidectomy    VASECTOMY  1973   WOUND DEBRIDEMENT Right 04/13/2022   Procedure: 29906 - SUBTALAR ARTHROSCOPY;  Surgeon: Ashley Soulier, DPM;  Location: [facility] ORS;  Service:  Podiatry;  Laterality: Right;    Allergies: Allergies as of 12/19/2024 - Review Complete 12/19/2024  Allergen Reaction Noted   Codeine Nausea And Vomiting 07/26/2015    Medications: Current Medications[1]   Social History: Social History[2]  Family Medical History: Family History  Problem Relation Age of Onset   Arthritis Mother    Esophageal cancer Father    Prostate cancer Neg Hx    Bladder Cancer Neg Hx    Kidney cancer Neg Hx     Physical Examination: Vitals:   12/20/24 0730 12/20/24 0735  BP: 131/80   Pulse: 73   Resp: 18   Temp:  (!) 97.3  F (36.3 C)  SpO2: 100%      General: Patient is well developed, well nourished, calm, collected, and in no apparent distress.  NEUROLOGICAL:  General: In no acute distress.   Awake, alert, oriented to person, place, and time.  Pupils equal round and reactive to light.  Full Facial tone is symmetric.  Tongue protrusion is midline.  Bilateral upper extremities are full strength proximally and distally.  There is no pronator drift.  Language is conversant.  GCS:***   Bilateral upper and lower extremity sensation is intact to light touch.  Imaging: CT HEAD WO CONTRAST Result Date: 12/19/2024 CLINICAL DATA:  Initial evaluation for acute altered mental status. EXAM: CT HEAD WITHOUT CONTRAST TECHNIQUE: Contiguous axial images were obtained from the base of the skull through the vertex without intravenous contrast. RADIATION DOSE REDUCTION: This exam was performed according to the departmental dose-optimization program which includes automated exposure control, adjustment of the mA and/or kV according to patient size and/or use of iterative reconstruction technique. COMPARISON:  Prior study from 06/24/2024. FINDINGS: Brain: Age-related cerebral atrophy with moderate chronic microvascular ischemic disease. Remote right thalamic lacunar infarct. No acute large vessel territory infarct. Mixed density subdural hematoma overlying the left cerebral convexity measures up to 1.2 cm in maximal thickness, consistent with a subacute subdural hematoma. Mass effect on the subjacent left cerebral hemisphere with 4 mm left-to-right shift. No hydrocephalus or trapping. Vascular: No abnormal hyperdense vessel. Calcified atherosclerosis present at skull base. Skull: Scalp soft tissues demonstrate no acute finding. Calvarium intact. Sinuses/Orbits: Globes and orbital soft tissues within normal limits. Mild mucosal thickening about the sphenoid ethmoidal sinuses. Moderate right mastoid effusion. Left mastoid air cells are  clear. Other: None. IMPRESSION: 1. Subacute subdural hematoma overlying the left cerebral convexity measuring up to 1.2 cm in maximal thickness. Mass effect on the subjacent left cerebral hemisphere with 4 mm left-to-right shift. No hydrocephalus or trapping. 2. Age-related cerebral atrophy with moderate chronic microvascular ischemic disease, with remote right thalamic lacunar infarct. Critical Value/emergent results were called by telephone at the time of interpretation on 12/19/2024 at 8:44 pm to provider [facility] , who verbally acknowledged these results. Electronically Signed   By: Morene Hoard M.D.   On: 12/19/2024 20:45     I have personally reviewed the images and agree with the above interpretation.  Labs:    Latest Ref Rng & Units 12/20/2024    5:09 AM 12/19/2024    5:28 PM 11/10/2024    6:50 AM  CBC  WBC 4.0 - 10.5 K/uL 8.2  9.4    Hemoglobin 13.0 - 17.0 g/dL 87.9  87.9  8.7   Hematocrit 39.0 - 52.0 % 37.3  38.4    Platelets 150 - 400 K/uL 269  283        Latest Ref Rng & Units 12/20/2024  5:09 AM 12/19/2024    5:28 PM 11/08/2024    4:54 AM  BMP  Glucose 70 - 99 mg/dL 898  883  895   BUN 8 - 23 mg/dL 14  19  20    Creatinine 0.61 - 1.24 mg/dL 8.93  8.92  8.99   Sodium 135 - 145 mmol/L 140  140  143   Potassium 3.5 - 5.1 mmol/L 3.8  3.7  3.5   Chloride 98 - 111 mmol/L 104  103  108   CO2 22 - 32 mmol/L 22  20  24    Calcium  8.9 - 10.3 mg/dL 9.7  9.3  8.7         Assessment and Plan: Mr. Solivan is a pleasant 79 y.o. male with a history of dementia with behavioral disturbance, recent GI bleed secondary to anticoagulation, recent UTI with behavioral disturbance and sensorium changes.  No major trauma noted.  Found to have a left-sided subacute subdural hematoma with some local mass effect and mild midline shift.  He does however have a significant amount of cerebral atrophy so overall the level of compression appears to be quite limited.    I have discussed the  condition with the patient, including showing the radiographs and discussing treatment options in layman's terms.  The patient may benefit from conservative management.  Thus, I have recommended the following: ***.  We will plan to arrange follow-up in the neurosurgery clinic as an outpatient***.    Penne MICAEL Sharps, MD/MSCR Dept. of Neurosurgery       [1]  Current Facility-Administered Medications:    0.9 %  sodium chloride  infusion, , Intravenous, Continuous, Mansy, Jan A, MD   acetaminophen  (TYLENOL ) tablet 650 mg, 650 mg, Oral, Q6H PRN **OR** acetaminophen  (TYLENOL ) suppository 650 mg, 650 mg, Rectal, Q6H PRN, Mansy, Jan A, MD   ascorbic acid  (VITAMIN C ) tablet 500 mg, 500 mg, Oral, BID, Mansy, Jan A, MD   cyanocobalamin  (VITAMIN B12) tablet 1,000 mcg, 1,000 mcg, Oral, Daily, Mansy, Jan A, MD   donepezil  (ARICEPT ) tablet 10 mg, 10 mg, Oral, QHS, Mansy, Jan A, MD   feeding supplement (ENSURE PLUS HIGH PROTEIN) liquid 237 mL, 237 mL, Oral, TID BM, Mansy, Jan A, MD   haloperidol  lactate (HALDOL ) injection 1-2 mg, 1-2 mg, Intramuscular, Q6H PRN, Mansy, Jan A, MD   hydrALAZINE  (APRESOLINE ) injection 10 mg, 10 mg, Intravenous, Q6H PRN, Mansy, Jan A, MD   labetalol  (NORMODYNE ) injection 20 mg, 20 mg, Intravenous, Q3H PRN, Mansy, Jan A, MD   lidocaine  (LIDODERM ) 5 % 1 patch, 1 patch, Transdermal, Q24H, Mansy, Jan A, MD   loperamide  (IMODIUM ) capsule 2 mg, 2 mg, Oral, PRN, Mansy, Jan A, MD   magnesium  hydroxide (MILK OF MAGNESIA) suspension 30 mL, 30 mL, Oral, Daily PRN, Mansy, Jan A, MD   multivitamin with minerals tablet 1 tablet, 1 tablet, Oral, Daily, Mansy, Jan A, MD   ondansetron  (ZOFRAN ) tablet 4 mg, 4 mg, Oral, Q6H PRN **OR** ondansetron  (ZOFRAN ) injection 4 mg, 4 mg, Intravenous, Q6H PRN, Mansy, Jan A, MD   pantoprazole  (PROTONIX ) EC tablet 40 mg, 40 mg, Oral, BID, Mansy, Jan A, MD   QUEtiapine  (SEROQUEL ) tablet 50 mg, 50 mg, Oral, QHS, Mansy, Jan A, MD   risperiDONE  (RISPERDAL ) tablet  0.5 mg, 0.5 mg, Oral, BID PRN, Mansy, Jan A, MD   tamsulosin  (FLOMAX ) capsule 0.4 mg, 0.4 mg, Oral, QPC supper, Mansy, Jan A, MD   traZODone  (DESYREL ) tablet 25 mg, 25 mg, Oral, QHS PRN,  Mansy, Madison LABOR, MD   ziprasidone  (GEODON ) injection 10 mg, 10 mg, Intramuscular, Once, Fernand Rossie HERO, MD   ziprasidone  (GEODON ) injection 10 mg, 10 mg, Intramuscular, Q6H PRN, Mansy, Madison LABOR, MD  Current Outpatient Medications:    acetaminophen  (TYLENOL ) 325 MG tablet, Take 650 mg by mouth every 8 (eight) hours as needed for moderate pain., Disp: , Rfl:    ascorbic acid  (VITAMIN C ) 500 MG tablet, Take 1 tablet (500 mg total) by mouth 2 (two) times daily., Disp: 60 tablet, Rfl: 0   cyanocobalamin  (VITAMIN B12) 1000 MCG tablet, Take 1,000 mcg by mouth.  Take 1,000 mcg by mouth once daily, Disp: , Rfl:    donepezil  (ARICEPT ) 10 MG tablet, Take 10 mg by mouth.  TAKE 1 TABLET(10 MG) BY MOUTH AT BEDTIME, Disp: , Rfl:    loperamide  (IMODIUM ) 2 MG capsule, Take 1 capsule (2 mg total) by mouth as needed for diarrhea or loose stools., Disp: 10 capsule, Rfl: 0   Multiple Vitamin (MULTIVITAMIN WITH MINERALS) TABS tablet, Take 1 tablet by mouth daily., Disp: , Rfl:    pantoprazole  (PROTONIX ) 40 MG tablet, Take 1 tablet (40 mg total) by mouth 2 (two) times daily., Disp: 60 tablet, Rfl: 0   QUEtiapine  (SEROQUEL ) 50 MG tablet, Take 1 tablet (50 mg total) by mouth at bedtime. (Patient taking differently: Take 25 mg by mouth at bedtime.), Disp: 30 tablet, Rfl: 0   feeding supplement (ENSURE PLUS HIGH PROTEIN) LIQD, Take 237 mLs by mouth 3 (three) times daily between meals., Disp: 21330 mL, Rfl: 0   lidocaine  (LIDODERM ) 5 %, Remove & Discard patch within 12 hours or as directed by MD. Apply to area of pain (Patient not taking: Reported on 12/20/2024), Disp: 30 patch, Rfl: 0   risperiDONE  (RISPERDAL ) 0.5 MG tablet, Take 1 tablet (0.5 mg total) by mouth 2 (two) times daily as needed (agitation). (Patient not taking: Reported on 12/20/2024),  Disp: 60 tablet, Rfl: 0   tamsulosin  (FLOMAX ) 0.4 MG CAPS capsule, Take 1 capsule (0.4 mg total) by mouth daily after supper. (Patient not taking: Reported on 12/20/2024), Disp: 30 capsule, Rfl: 0 [2]  Social History Tobacco Use   Smoking status: Every Day    Current packs/day: 0.50    Average packs/day: 0.5 packs/day for 50.0 years (25.0 ttl pk-yrs)    Types: Cigarettes   Smokeless tobacco: Never   Tobacco comments:    Half a pack a day, per wife.  Vaping Use   Vaping status: Never Used  Substance Use Topics   Alcohol use: No   Drug use: No

## 2024-12-21 ENCOUNTER — Inpatient Hospital Stay

## 2024-12-21 DIAGNOSIS — E785 Hyperlipidemia, unspecified: Secondary | ICD-10-CM | POA: Diagnosis not present

## 2024-12-21 DIAGNOSIS — S065XAA Traumatic subdural hemorrhage with loss of consciousness status unknown, initial encounter: Secondary | ICD-10-CM | POA: Diagnosis not present

## 2024-12-21 DIAGNOSIS — I160 Hypertensive urgency: Secondary | ICD-10-CM | POA: Diagnosis not present

## 2024-12-21 DIAGNOSIS — F03918 Unspecified dementia, unspecified severity, with other behavioral disturbance: Secondary | ICD-10-CM | POA: Diagnosis not present

## 2024-12-21 MED ORDER — AMLODIPINE BESYLATE 5 MG PO TABS
5.0000 mg | ORAL_TABLET | Freq: Every day | ORAL | Status: DC
  Administered 2024-12-21 – 2024-12-22 (×2): 5 mg via ORAL
  Filled 2024-12-21 (×2): qty 1

## 2024-12-21 MED ORDER — HYDRALAZINE HCL 20 MG/ML IJ SOLN: 10.0000 mg | Freq: Four times a day (QID) | INTRAMUSCULAR | Status: DC | PRN

## 2024-12-21 NOTE — Assessment & Plan Note (Signed)
 No history of fall.  May be secondary to significantly elevated blood pressure.  Neurosurgery is recommending conservative management and outpatient follow-up. -Ordered a repeat CT head to see the stability - Will monitor BP and keep systolic BP below 140. -Continue to monitor -PT is recommending home health

## 2024-12-21 NOTE — Care Management Important Message (Signed)
 Important Message  Patient Details  Name: Albert [name] MRN: [id number] Date of Birth: 1946-04-24   Important Message Given:  Yes - Medicare IM     [name] 12/21/2024, 2:43 PM

## 2024-12-21 NOTE — Assessment & Plan Note (Signed)
 Patient continued to have significant cognitive decline with advanced dementia. - This associated with depression and anxiety. - We will continue Xanax , Seroquel  and Risperdal .

## 2024-12-21 NOTE — Progress Notes (Signed)
 " Progress Note   Patient: Albert [name] FMW:[id number] DOB: 18-May-1946 DOA: 12/19/2024     2 DOS: the patient was seen and examined on 12/21/2024   Brief hospital course: Partly taken from H&P.  Albert [name] is a 79 y.o. male with medical history significant for anxiety, osteoarthritis, GERD, dementia, hypertension, dyslipidemia and urolithiasis, who presented to the ER with acute onset of excessive sleepiness over the last few days with increasing confusion speaking in sentences that do not make sense.  He was recently admitted here for GI bleeding and discharged to rehab and later returned home with his wife.  He has been having dysuria.  On presentation blood pressure elevated at 190/104, labs with CO2 of 20, anion gap of 17, CBC with hemoglobin of 12.  UA negative for UTI. CT head with a subacute subdural hematoma overlying the left cerebral convexity measuring up to 1.2 cm with some mass effect and 4 mm left-to-right shift, also noted age-related cerebral atrophy with moderate chronic microvascular disease.  Remote right thalamic lacunar infarct.  Neurosurgery was consulted.  1/4: Vital and labs stable, history of advanced dementia with significant cognitive impairment at baseline.  PT is recommending home health. Wife to discuss with son regarding our offer about palliative care, stating that son at this time does not want them to get involved. Neurosurgery is recommending conservative management with outpatient follow-up.  1/5: Overnight agitation with history of advanced dementia.  Still appears confused, likely close to his baseline.  Repeating CT head to see the stability.  Blood pressure still mildly elevated, losartan  was added yesterday, adding amlodipine  5 mg daily-might need dose titration.  Apparently his antihypertensives were stopped for unknown reason.  Goal blood pressure less than 140  Assessment and Plan: * Subdural hematoma (HCC) No history of fall.  May be  secondary to significantly elevated blood pressure.  Neurosurgery is recommending conservative management and outpatient follow-up. -Ordered a repeat CT head to see the stability - Will monitor BP and keep systolic BP below 140. -Continue to monitor -PT is recommending home health  Hypertensive urgency Blood pressure mildly elevated.  Patient was not on any antihypertensives at home, apparently they were discontinued. -Losartan  was added yesterday -Adding amlodipine  5 mg daily, apparently he was on amlodipine  10 mg before discontinuing antihypertensives. - Continue as needed hydralazine  and labetalol  -Hold blood pressure less than 140  Dyslipidemia - Will continue statin therapy.  Dementia with behavioral disturbance [facility]) Patient continued to have significant cognitive decline with advanced dementia. - This associated with depression and anxiety. - We will continue Xanax , Seroquel  and Risperdal .  GERD without esophagitis - Will continue PPI therapy.      Subjective: Patient was appear calm but still confused, likely at his baseline.  No new deficit.  Physical Exam: Vitals:   12/21/24 0321 12/21/24 0740 12/21/24 0835 12/21/24 1146  BP: 136/87 (!) 177/88 (!) 144/88 (!) 153/96  Pulse: 79 62 66 74  Resp: 19 20  20   Temp: 97.6 F (36.4 C) 98.3 F (36.8 C)  (!) 97.4 F (36.3 C)  TempSrc: Oral Oral  Oral  SpO2: 97% 97%  95%  Height:       General.  Frail elderly man, in no acute distress. Pulmonary.  Lungs clear bilaterally, normal respiratory effort. CV.  Regular rate and rhythm, no JVD, rub or murmur. Abdomen.  Soft, nontender, nondistended, BS positive. CNS.  Alert and oriented to name only.  No focal neurologic deficit. Extremities.  No edema, pulses intact and symmetrical. Psychiatry.  Judgment and insight appears impaired  Data Reviewed: Prior data reviewed  Family Communication: Discussed with wife at bedside  Disposition: Status is: Inpatient Remains  inpatient appropriate because: Severity of illness  Planned Discharge Destination: Home with Home Health  DVT prophylaxis.  SCDs Time spent: 50 minutes  This record has been created using Conservation officer, historic buildings. Errors have been sought and corrected,but may not always be located. Such creation errors do not reflect on the standard of care.   Author: Amaryllis Dare, MD 12/21/2024 1:49 PM  For on call review [email or web address].  "

## 2024-12-21 NOTE — TOC Initial Note (Signed)
 Transition of Care (TOC) - Initial/Assessment Note    Patient Details  Name: Albert [name] MRN: [id number] Date of Birth: October 15, 1946  Transition of Care [facility]) CM/SW Contact:    Shasta DELENA Daring, RN Phone Number: 12/21/2024, 11:19 AM  Clinical Narrative:                 RNCM met with patient in room. Wife at bedside. Wife states patient has dementia and she answered all questions.  Patient lives in apartment with wife. Has HH PT services with [facility]. They would like to remain with [facility] at discharge if appropriate. Interested in adding nursing service.  Has walker, cane and tub bench in home. Would like to also have a wheelchair. Uses [facility] on [address] in [place] for pharmacy needs. Denies any difficulty affording medication.  Wife provides transportation.  Will follow for wheelchair recommendations and will communicate with [facility] for continuity of care.  Expected Discharge Plan: Home w Home Health Services Barriers to Discharge: Continued Medical Work up   Patient Goals and CMS Choice            Expected Discharge Plan and Services In-house Referral: Clinical Social Work Discharge Planning Services: CM Consult Post Acute Care Choice: Home Health Living arrangements for the past 2 months: Apartment                                      Prior Living Arrangements/Services Living arrangements for the past 2 months: Apartment Lives with:: Spouse Patient language and need for interpreter reviewed:: Yes Do you feel safe going back to the place where you live?: Yes      Need for Family Participation in Patient Care: Yes (Comment) Care giver support system in place?: Yes (comment) Current home services: DME, Home PT Criminal Activity/Legal Involvement Pertinent to Current Situation/Hospitalization: No - Comment as needed  Activities of Daily Living   ADL Screening (condition at time of admission) Independently performs ADLs?: No Does the patient have a  NEW difficulty with bathing/dressing/toileting/self-feeding that is expected to last >3 days?: Yes (Initiates electronic notice to provider for possible OT consult) Does the patient have a NEW difficulty with getting in/out of bed, walking, or climbing stairs that is expected to last >3 days?: Yes (Initiates electronic notice to provider for possible PT consult) Does the patient have a NEW difficulty with communication that is expected to last >3 days?: Yes (Initiates electronic notice to provider for possible SLP consult) Is the patient deaf or have difficulty hearing?: No Does the patient have difficulty seeing, even when wearing glasses/contacts?: No Does the patient have difficulty concentrating, remembering, or making decisions?: Yes  Permission Sought/Granted Permission sought to share information with : Case Manager Permission granted to share information with : Yes, Verbal Permission Granted     Permission granted to share info w AGENCY: [facility]        Emotional Assessment Appearance:: Appears stated age Attitude/Demeanor/Rapport: Unable to Assess Affect (typically observed): Unable to Assess Orientation: :  (dementia) Alcohol / Substance Use: Not Applicable Psych Involvement: No (comment)  Admission diagnosis:  Subdural hematoma (HCC) [S06.5XAA] Subacute subdural hematoma (HCC) [S06.5XAA] Patient Active Problem List   Diagnosis Date Noted   Subdural hematoma (HCC) 12/19/2024   Hypertensive urgency 12/19/2024   Dementia with behavioral disturbance (HCC) 12/19/2024   Dyslipidemia 12/19/2024   GERD without esophagitis 12/19/2024   Elevated liver function tests 11/06/2024  Pressure injury 11/04/2024   Acute blood loss anemia 11/04/2024   Aspiration pneumonia (HCC) 11/04/2024   Dysphagia 11/04/2024   Chronic diastolic CHF (congestive heart failure) (HCC) 11/04/2024   Acute urinary retention 11/04/2024   Myocardial injury 11/04/2024   AKI (acute kidney injury) 11/04/2024    Electrolyte abnormality 11/04/2024   Malnutrition of moderate degree 10/28/2024   Upper GI bleed 10/27/2024   Acute respiratory failure with hypoxia (HCC) 10/27/2024   Toxic metabolic encephalopathy 10/27/2024   GI bleeding 10/24/2024   Acute upper GI bleed 10/24/2024   AAA (abdominal aortic aneurysm) without rupture 08/28/2024   Acute lacunar stroke (HCC) 06/23/2024   Severe Alzheimer's dementia with agitation (HCC) 06/24/2020   Toe fracture 02/22/2020   Displaced fracture of proximal phalanx of right lesser toe(s), initial encounter for closed fracture 07/30/2019   H/O stroke without residual deficits 03/03/2018   Prediabetes 08/01/2017   Anxiety 12/26/2016   Common migraine 12/26/2016   GERD (gastroesophageal reflux disease) 12/26/2016   Health care maintenance 12/26/2016   HTN, goal below 140/90 12/26/2016   Hyperlipidemia, unspecified 12/26/2016   Degenerative spondylolisthesis 03/13/2016   PCP:  Lenon [name] ORN, MD Pharmacy:   [id number] [place], [place] - [address] AT [facility] [address] [facility] [address] [place] [address] Phone: [phone] Fax: [phone]     Social Drivers of Health (SDOH) Social History: SDOH Screenings   Food Insecurity: Patient Unable To Answer (12/20/2024)  Housing: Patient Unable To Answer (12/20/2024)  Transportation Needs: Patient Unable To Answer (12/20/2024)  Utilities: Patient Unable To Answer (12/20/2024)  Financial Resource Strain: Low Risk  (08/31/2024)   Received from [facility]  Social Connections: Patient Unable To Answer (12/20/2024)  Tobacco Use: High Risk (12/20/2024)   SDOH Interventions:     Readmission Risk Interventions    12/21/2024   11:16 AM 10/30/2024   12:32 PM  Readmission Risk Prevention Plan  Transportation Screening Complete Complete  PCP or Specialist Appt within 5-7 Days  Complete  PCP or Specialist Appt within 3-5 Days Complete   Home Care  Screening  Complete  Medication Review (RN CM)  Complete  HRI or Home Care Consult Complete   Social Work Consult for Recovery Care Planning/Counseling Complete   Palliative Care Screening Not Applicable   Medication Review Oceanographer) Complete

## 2024-12-21 NOTE — Progress Notes (Signed)
 PT Cancellation Note  Patient Details Name: Albert [name] MRN: [id number] DOB: 08-12-1946   Cancelled Treatment:    Reason Eval/Treat Not Completed: Patient not medically ready BP elevated at rest per chart review. MD recommended BP below 140 mmHg. MD contacted and recommended hold until imaging confirms hematoma is stable.    Sarissa Dern A Lennyn Bellanca 12/21/2024, 3:35 PM

## 2024-12-21 NOTE — Plan of Care (Signed)
  Problem: Activity: Goal: Risk for activity intolerance will decrease Outcome: Progressing   Problem: Elimination: Goal: Will not experience complications related to urinary retention Outcome: Progressing   Problem: Pain Managment: Goal: General experience of comfort will improve and/or be controlled Outcome: Progressing   Problem: Safety: Goal: Ability to remain free from injury will improve Outcome: Progressing   Problem: Skin Integrity: Goal: Risk for impaired skin integrity will decrease Outcome: Progressing

## 2024-12-21 NOTE — Progress Notes (Signed)
 OT Cancellation Note  Patient Details Name: Albert [name] MRN: [id number] DOB: [birth year]   Cancelled Treatment:    Reason Eval/Treat Not Completed: Patient not medically ready;Other (comment) (SPB >140, hold per Dr Caleen)  [name] Clause 12/21/2024, 3:42 PM

## 2024-12-21 NOTE — Assessment & Plan Note (Signed)
 Blood pressure mildly elevated.  Patient was not on any antihypertensives at home, apparently they were discontinued. -Losartan  was added yesterday -Adding amlodipine  5 mg daily, apparently he was on amlodipine  10 mg before discontinuing antihypertensives. - Continue as needed hydralazine  and labetalol  -Hold blood pressure less than 140

## 2024-12-22 ENCOUNTER — Other Ambulatory Visit: Payer: Self-pay

## 2024-12-22 DIAGNOSIS — S065XAA Traumatic subdural hemorrhage with loss of consciousness status unknown, initial encounter: Secondary | ICD-10-CM

## 2024-12-22 DIAGNOSIS — I160 Hypertensive urgency: Secondary | ICD-10-CM | POA: Diagnosis not present

## 2024-12-22 DIAGNOSIS — E785 Hyperlipidemia, unspecified: Secondary | ICD-10-CM | POA: Diagnosis not present

## 2024-12-22 DIAGNOSIS — Z515 Encounter for palliative care: Secondary | ICD-10-CM

## 2024-12-22 DIAGNOSIS — F03918 Unspecified dementia, unspecified severity, with other behavioral disturbance: Secondary | ICD-10-CM | POA: Diagnosis not present

## 2024-12-22 MED ORDER — LOSARTAN POTASSIUM 50 MG PO TABS
50.0000 mg | ORAL_TABLET | Freq: Every day | ORAL | 2 refills | Status: DC
  Filled 2024-12-22: qty 30, 30d supply, fill #0

## 2024-12-22 MED ORDER — AMLODIPINE BESYLATE 5 MG PO TABS
5.0000 mg | ORAL_TABLET | Freq: Once | ORAL | Status: AC
  Administered 2024-12-22: 5 mg via ORAL
  Filled 2024-12-22: qty 1

## 2024-12-22 MED ORDER — AMLODIPINE BESYLATE 10 MG PO TABS
10.0000 mg | ORAL_TABLET | Freq: Every day | ORAL | 2 refills | Status: DC
  Filled 2024-12-22: qty 30, 30d supply, fill #0

## 2024-12-22 MED ORDER — AMLODIPINE BESYLATE 10 MG PO TABS
10.0000 mg | ORAL_TABLET | Freq: Every day | ORAL | Status: DC
  Administered 2024-12-23: 10 mg via ORAL
  Filled 2024-12-22: qty 1

## 2024-12-22 NOTE — Telephone Encounter (Signed)
-----   Message from [facility] Anastasiya H sent at 12/21/2024  1:09 PM EST ----- Currently still admitted at the hospital ----- Message ----- From: Gregory Edsel Ruth, PA Sent: 12/21/2024  12:38 PM EST To: [facility] Admin; [facility] Cli#  Pt was seen in consult by Dr. Claudene on 1/4 for left-sided subacute subdural hematoma with some local mass effect and mild midline shift. He needs follow up in 2-3 weeks with CT head prior. Should be ok for [facility].

## 2024-12-22 NOTE — Consult Note (Signed)
 "                                                                                   Consultation Note Date: 12/22/2024 at 1545  Patient Name: Albert [name]  DOB: 1946-03-24  MRN: [id number]  Age / Sex: 79 y.o., male  PCP: Lenon [name] ORN, MD Referring Physician: Caleen Qualia, MD  HPI/Patient Profile: 79 y.o. male  with past medical history of anxiety, osteoarthritis, urolithiasis, dyslipidemia, advanced dementia, and GERD without esophagitis admitted on 12/19/2024 with acute onset of excessive sleepiness over the last few days with increasing confusion.  Patient has a recent admission for GI bleed and was discharged to rehab, later returning home with his wife's primary caregiver.  Upon presentation to the ED, patient was hypertensive.  CT of the head revealed a subacute subdural hematoma overlying the left cerebral convexity measuring up to 1.2 cm with some mass effect and 4 mm left-to-right shift.  Also, noted age-related cerebral atrophy with moderate chronic microvascular disease and remote right thalamic lacunar infarct.  Neurosurgery was consulted.  Significant hospital events include:  1/4: Vital and labs stable, history of advanced dementia with significant cognitive impairment at baseline.  PT is recommending home health. Wife to discuss with son regarding our offer about palliative care, stating that son at this time does not want them to get involved. Neurosurgery is recommending conservative management with outpatient follow-up.   1/5: Overnight agitation with history of advanced dementia.  Still appears confused, likely close to his baseline.  Repeating CT head to see the stability.  Blood pressure still mildly elevated, losartan  was added yesterday, adding amlodipine  5 mg daily-might need dose titration.  Apparently his antihypertensives were stopped for unknown reason.  Goal blood pressure less than 140.   1/6: Hemodynamically stable, systolic blood pressure at 143 this  morning, we have made his amlodipine  10 mg daily and he will continue with current dose of losartan .  Wife was instructed to keep taking his blood pressure to keep it below 140.  He needed close follow-up with primary care provider for better control of his hypertension.  Apparently has stopped taking home antihypertensives.   Repeat CT head with stable subdural hematoma and 4 mm midline shift.  No new changes.  Patient need to follow-up with neurosurgery as outpatient for further recommendations and management of his subdural hematoma.   Patient with significant cognitive impairment with advanced dementia.  Home health services was ordered to help wife to take care of him at home.  Patient was discharged at 11:30 AM this morning.  However, patient's wife requested to speak with palliative medicine team prior to discharge.  PMT was consulted to support patient and family with goals of care discussions.  Clinical Assessment and Goals of Care: Extensive chart review completed prior to meeting patient including labs, vital signs, imaging, progress notes, orders, and available advanced directive documents from current and previous encounters. I then met with and his wife Albert [name] at bedside to discuss diagnosis prognosis, GOC, EOL wishes, disposition and options.  I introduced Palliative Medicine as specialized medical care for people living with serious illness. It focuses on providing relief from  the symptoms and stress of a serious illness. The goal is to improve quality of life for both the patient and the family.  Patient is awake, alert, and makes eye contact with me.  However, he makes no verbalizations during her visit.  He does not answer simple orientation questions.  He remains unable to participate in goals of care medical decision making independently at this time.  Nurse tech at bedside shared concern that patient has not made urine all day.  Together we checked his PureWick and it is dry.   Attending made aware.  Patient's wife Albert [name] is his established next of kin/HCPOA.  Margaret and I discussed a brief life review of the patient.  Albert [name] and Albert [name] have been married for 45 years.  They have 2 sons.  Albert [name] shares that she feels as though Albert [name] is an old friend and that she has known him all of her life.  As far as functional and nutritional status she shares she has seen parts of him disappear throughout the years.  She shares she first noticed onset of memory issues approximately 9 years ago.  However, she endorses that over the last year she has seen a significant change in his personality and mobility.  We discussed patient's current illness and what it means in the larger context of patient's on-going co-morbidities.  Reviewed dementia as a chronic, progressive, and irreversible disease that is often exacerbated by acute illnesses and hospitalizations.  Natural disease trajectory and expectations at EOL were discussed.  Albert [name] shares she understands that this is a irreversible disease but that she is hopeful once he is home he might improve a little bit.  Discussed remaining hopeful and realistic for patient recovering from subdural hematoma and its impact on his advanced dementia.  I attempted to elicit values and goals of care important to the patient.  Albert [name] shares she would like to know what home hgalth aide/help palliative services can provide.  Extensive education provided on palliative medicine services versus hospice services versus home health services.  Reviewed that palliative medicine in the outpatient setting does not provide any additional in-home health aide.  Albert [name] was appreciative of this information.  She shares concern that she is not going to be able to provide the care that he needs at home.  However, she wishes to take the patient home, continue with follow-up as prescribed-CT scan has already been scheduled outpatient.  Reviewed enacting  patient's hospice services does not require hospitalization or an acute event.  Discussed that [facility] home health services can continue with PT/OT/RN/aide.  However, if patient enacts hospice benefits PT/OT/aide would be stopped and care would transfer solely to hospice MD/team.  Again, Albert [name] was appreciative of the education and the difference between palliative medicine and hospice services.  At this time, she does not wish to enact patient's hospice benefits.  She wishes to continue with current plan of care and discharge home today.  No change to plan of care at this time.  Above discussion conveyed to attending.  Questions and concerns were addressed. Albert [name] was encouraged to call with questions or concerns.   Primary Decision Maker NEXT OF KIN  Physical Exam Vitals reviewed.  Constitutional:      General: He is not in acute distress. HENT:     Head: Normocephalic.     Mouth/Throat:     Mouth: Mucous membranes are moist.  Eyes:     Pupils: Pupils are equal, round, and reactive to light.  Pulmonary:  Effort: Pulmonary effort is normal.  Abdominal:     Palpations: Abdomen is soft.  Skin:    General: Skin is warm and dry.  Neurological:     Mental Status: He is alert.     Comments: nonverbal  Psychiatric:        Behavior: Behavior normal.     Palliative Assessment/Data: 40-50%     Thank you for this consult. Palliative medicine will continue to follow and assist holistically.   I personally spent a total of 75 minutes in the care of the patient today including preparing to see the patient, getting/reviewing separately obtained history, performing a medically appropriate exam/evaluation, counseling and educating, and documenting clinical information in the EHR.   Signed by: Lamarr Gunner, DNP, FNP-BC Palliative Medicine   Please contact Palliative Medicine Team providers via [facility] for questions and concerns.                "

## 2024-12-22 NOTE — Plan of Care (Signed)

## 2024-12-22 NOTE — Addendum Note (Signed)
 Addended by: GIRARD DON GAILS on: 12/22/2024 09:47 AM   Modules accepted: Orders

## 2024-12-22 NOTE — Evaluation (Signed)
 Occupational Therapy Evaluation Patient Details Name: Albert [name] MRN: [id number] DOB: [birth year] Today's Date: 12/22/2024   History of Present Illness   Pt is a 79 y.o. male admitted with SDH (mass effect on the  subjacent left cerebral hemisphere with 4 mm left-to-right shift) & remote R thalamic infarct. Neurosurgery recommending conservative mgmt with outpatient f/u. PMH significant for anxiety, advanced dementia, OA, GERD, HTN, DM, GI bleed, recent UTI.     Clinical Impressions Pt admitted with above. Spouse provides insight to home setup and PLOF as pt has advanced dementia. Prior to admission, pt requires assist with all BADLs including self-feeding, shaving and sponge bathing. Pt ambulates household distances with [facility] and requires step-by-step cues for all functional tasks. Spouse reports increased caregiver burden as pt requires significant assist at home. Pt mostly non-verbal throughout OT eval, occasionally answers to name or responds yes.   Pt requires multi-modal cuing for all tasks, MOD A for bed mobility, MAX A for UB ADLs, is able to perform STS x 2 with +2 HHA MIN A. Pt found to be incontinent of BM, TOTAL A for standing pericare, can take a few steps laterally towards HOB with significant cues. HR elevated to 130 bpm + with ~30 seconds of standing. Spouse expressing concerns for discharge today given poor tolerance to activity and pt's advanced dementia, would benefit from palliative consult. Pt would benefit from skilled OT services to address noted impairments and functional limitations (see below for any additional details) in order to maximize safety and independence while minimizing falls risk and caregiver burden. Anticipate the need for follow up [facility] OT services upon acute hospital DC.      If plan is discharge home, recommend the following:   A lot of help with walking and/or transfers;A lot of help with bathing/dressing/bathroom;Supervision due to cognitive  status;Assist for transportation;Direct supervision/assist for financial management;Direct supervision/assist for medications management;Assistance with feeding;Assistance with cooking/housework;Help with stairs or ramp for entrance     Functional Status Assessment   Patient has had a recent decline in their functional status and demonstrates the ability to make significant improvements in function in a reasonable and predictable amount of time.     Equipment Recommendations   None recommended by OT     Recommendations for Other Services   Other (comment) (pallative care, hospice consult)     Precautions/Restrictions   Precautions Precautions: Fall Recall of Precautions/Restrictions: Impaired Precaution/Restrictions Comments: advanced dementia, mostly non-verbal Restrictions Weight Bearing Restrictions Per Provider Order: No     Mobility Bed Mobility Overal bed mobility: Needs Assistance Bed Mobility: Supine to Sit, Sit to Supine     Supine to sit: Mod assist Sit to supine: Mod assist   General bed mobility comments: +2 fir safety, cues    Transfers Overall transfer level: Needs assistance Equipment used: 2 person hand held assist Transfers: Sit to/from Stand Sit to Stand: Contact guard assist, Min assist           General transfer comment: HR elevated to 130+ with standing attempts, pt fatigues quickly, able to take lateral sidesteps with max verbal cues      Balance Overall balance assessment: Needs assistance Sitting-balance support: Feet supported Sitting balance-Leahy Scale: Fair     Standing balance support: Bilateral upper extremity supported Standing balance-Leahy Scale: Fair Standing balance comment: limited by cognition and elevated HR  ADL either performed or assessed with clinical judgement   ADL Overall ADL's : Needs assistance/impaired Eating/Feeding: Maximal assistance;Sitting Eating/Feeding  Details (indicate cue type and reason): requires assist to self-feed. pt recieved in bed eating lunch with spouse feeding him. spouse reports that pt can sometimes feed himself finger foods (messaged RN to update diet order) Grooming: Sitting;Total assistance;Maximal assistance   Upper Body Bathing: Sitting;Maximal assistance Upper Body Bathing Details (indicate cue type and reason): sitting EOB. pt able to maintain static seated balance, unable to follow commands to assist with sponge bath Lower Body Bathing: Total assistance;+2 for safety/equipment;Sit to/from stand Lower Body Bathing Details (indicate cue type and reason): requires total A from STS for LB bathing Upper Body Dressing : Maximal assistance;Sitting           Toileting- Clothing Manipulation and Hygiene: Total assistance;+2 for safety/equipment;Sit to/from stand Toileting - Clothing Manipulation Details (indicate cue type and reason): pt found to be incontinent of BM, TOTAL A for standing pericare. pt frequently attempting to sit during standing attempts, standing effortful for pt with elevated HR to 130 bpm after returning to seated, dyspnea present     Functional mobility during ADLs: Contact guard assist;Cueing for sequencing;Cueing for safety;Minimal assistance General ADL Comments: pt able to stand 2x with +2 HHA MIN. requires step by step cues due to dementia, poor activity tolerance with HR elevated to 130+ bpm with standing efforts. Does decrease to 100 bpm after rest break.      Pertinent Vitals/Pain Pain Assessment Pain Assessment: PAINAD Breathing: normal Negative Vocalization: occasional moan/groan, low speech, negative/disapproving quality Facial Expression: sad, frightened, frown (with bathing (spouse said normal)) Body Language: relaxed Consolability: no need to console PAINAD Score: 2 Pain Intervention(s): Limited activity within patient's tolerance, Monitored during session, Repositioned      Extremity/Trunk Assessment Upper Extremity Assessment Upper Extremity Assessment: Generalized weakness;Difficult to assess due to impaired cognition (difficult to formally assess with hx of dementia, pt is able to touch top of head and hold on to OT's hands with equal strength)   Lower Extremity Assessment Lower Extremity Assessment: Difficult to assess due to impaired cognition;Generalized weakness   Cervical / Trunk Assessment Cervical / Trunk Assessment: Kyphotic   Communication Communication Communication: Impaired Factors Affecting Communication: Difficulty expressing self   Cognition Arousal: Alert Behavior During Therapy: Flat affect Cognition: History of cognitive impairments             OT - Cognition Comments: pt mostly non-verbal, will answer to his name. follows commands inconsisently.                 Following commands: Impaired Following commands impaired: Follows one step commands inconsistently     Cueing  General Comments   Cueing Techniques: Verbal cues;Gestural cues;Tactile cues;Visual cues  HR elevated to 130 bpm + with standing attempts. Discussed pallative consult with pt and spouse - spouse in agreement. Secure chat to MD / RN with concerns re: elevated HR with minimal activity and d/c.           Home Living Family/patient expects to be discharged to:: Private residence Living Arrangements: Spouse/significant other Available Help at Discharge: Family;Available 24 hours/day Type of Home: Apartment Home Access: Level entry     Home Layout: One level     Bathroom Shower/Tub: Tub/shower unit;Sponge bathes at baseline   Bathroom Toilet: Handicapped height     Home Equipment: Cane - single point          Prior Functioning/Environment Prior Level of  Function : Needs assist  Cognitive Assist : Mobility (cognitive);ADLs (cognitive) Mobility (Cognitive): Step by step cues ADLs (Cognitive): Step by step cues Physical Assist :  ADLs (physical);Mobility (physical) Mobility (physical): Transfers;Gait;Stairs ADLs (physical): Feeding;Grooming;Bathing;Dressing;Toileting;IADLs Mobility Comments: SPC inside home. Per spouse, pt does not have cognitive abilities to use RW (she has tried in past, he just carries it around). Uses cane or furniture. ADLs Comments: pt requires assistance for all BADLs due to advanced dementia. Occassionally can self-feed if finger foods provided.    OT Problem List: Decreased strength;Decreased activity tolerance;Impaired balance (sitting and/or standing);Decreased cognition;Decreased safety awareness;Decreased knowledge of use of DME or AE;Cardiopulmonary status limiting activity;Decreased coordination   OT Treatment/Interventions: Self-care/ADL training;DME and/or AE instruction;Cognitive remediation/compensation;Balance training;Patient/family education;Therapeutic exercise;Neuromuscular education;Therapeutic activities      OT Goals(Current goals can be found in the care plan section)   Acute Rehab OT Goals OT Goal Formulation: With family Time For Goal Achievement: 01/05/25 Potential to Achieve Goals: Fair   OT Frequency:  Min 2X/week       AM-PAC OT 6 Clicks Daily Activity     Outcome Measure Help from another person eating meals?: A Lot Help from another person taking care of personal grooming?: A Lot Help from another person toileting, which includes using toliet, bedpan, or urinal?: Total Help from another person bathing (including washing, rinsing, drying)?: Total Help from another person to put on and taking off regular upper body clothing?: Total Help from another person to put on and taking off regular lower body clothing?: Total 6 Click Score: 8   End of Session Nurse Communication: Mobility status;Other (comment)  Activity Tolerance: Patient tolerated treatment well Patient left: in bed;with call bell/phone within reach;with nursing/sitter in room;with  family/visitor present  OT Visit Diagnosis: Cognitive communication deficit (R41.841);Muscle weakness (generalized) (M62.81);Other abnormalities of gait and mobility (R26.89);Unsteadiness on feet (R26.81);Feeding difficulties (R63.3);Other symptoms and signs involving cognitive function Symptoms and signs involving cognitive functions: Other cerebrovascular disease;Cerebral infarction (subdural hematoma)                Time: 1326-1410 OT Time Calculation (min): 44 min Charges:  OT General Charges $OT Visit: 1 Visit OT Evaluation $OT Eval Moderate Complexity: 1 Mod OT Treatments $Self Care/Home Management : 38-52 mins Xylia Scherger L. Milayna Rotenberg, OTR/L  12/22/2024, 2:35 PM

## 2024-12-22 NOTE — TOC Transition Note (Signed)
 Transition of Care [facility]) - Discharge Note   Patient Details  Name: Albert [name] MRN: [id number] Date of Birth: February 11, 1946  Transition of Care [facility] CM/SW Contact:  Shasta DELENA Daring, RN Phone Number: 12/22/2024, 11:13 AM   Clinical Narrative:     Patient has discharge orders and summary. Notified [facility] rep of Orders including PT, OT, RN and aide. Sent summary through the hub.   Notified patient's wife of communication with [facility].   No additional TOC needs identified.  RNCM signing off.    Barriers to Discharge: Barriers Resolved   Patient Goals and CMS Choice            Discharge Placement                  Name of family member notified: Josmar Messimer Patient and family notified of of transfer: 12/22/24  Discharge Plan and Services Additional resources added to the After Visit Summary for   In-house Referral: Clinical Social Work Discharge Planning Services: CM Consult Post Acute Care Choice: Home Health                    HH Arranged: PT, RN, OT, Nurse's Aide HH Agency: [facility] Agency Contacted: 12/22/24 Time HH Agency Contacted: 1112 Representative spoke with at [facility] Agency: Channing Ee  Social Drivers of Health (SDOH) Interventions SDOH Screenings   Food Insecurity: Patient Unable To Answer (12/20/2024)  Housing: Patient Unable To Answer (12/20/2024)  Transportation Needs: Patient Unable To Answer (12/20/2024)  Utilities: Patient Unable To Answer (12/20/2024)  Financial Resource Strain: Low Risk  (08/31/2024)   Received from [facility]  Social Connections: Patient Unable To Answer (12/20/2024)  Tobacco Use: High Risk (12/20/2024)     Readmission Risk Interventions    12/21/2024   11:16 AM 10/30/2024   12:32 PM  Readmission Risk Prevention Plan  Transportation Screening Complete Complete  PCP or Specialist Appt within 5-7 Days  Complete  PCP or Specialist Appt within 3-5 Days Complete   Home Care  Screening  Complete  Medication Review (RN CM)  Complete  HRI or Home Care Consult Complete   Social Work Consult for Recovery Care Planning/Counseling Complete   Palliative Care Screening Not Applicable   Medication Review [employer]) Complete

## 2024-12-22 NOTE — Telephone Encounter (Signed)
 CT scan ordered to be done around 01/07/2025. Please schedule an appointment for after that date

## 2024-12-22 NOTE — Progress Notes (Signed)
 Discharge orders in place. Family not comfortable with discharge plan. MD aware. Palliative care consulted. Discharge postponed. Patient has no signs of distress.

## 2024-12-22 NOTE — Discharge Summary (Signed)
 " Physician Discharge Summary   Patient: Albert [name] MRN: [id number] DOB: September 09, 1946  Admit date:     12/19/2024  Discharge date: 12/22/2024  Discharge Physician: Amaryllis Dare   PCP: Lenon [name] ORN, MD   Recommendations at discharge:  Please obtain CBC and BMP on follow-up Patient will need repeat CT head to check the improvement in subdural hematoma by neurosurgery as outpatient Follow-up with neurosurgery Follow-up with primary care provider  Discharge Diagnoses: Principal Problem:   Subdural hematoma [facility]) Active Problems:   Hypertensive urgency   Dyslipidemia   Dementia with behavioral disturbance (HCC)   GERD without esophagitis   Hospital Course: Partly taken from H&P.  Albert [name] is a 79 y.o. male with medical history significant for anxiety, osteoarthritis, GERD, dementia, hypertension, dyslipidemia and urolithiasis, who presented to the ER with acute onset of excessive sleepiness over the last few days with increasing confusion speaking in sentences that do not make sense.  He was recently admitted here for GI bleeding and discharged to rehab and later returned home with his wife.  He has been having dysuria.  On presentation blood pressure elevated at 190/104, labs with CO2 of 20, anion gap of 17, CBC with hemoglobin of 12.  UA negative for UTI. CT head with a subacute subdural hematoma overlying the left cerebral convexity measuring up to 1.2 cm with some mass effect and 4 mm left-to-right shift, also noted age-related cerebral atrophy with moderate chronic microvascular disease.  Remote right thalamic lacunar infarct.  Neurosurgery was consulted.  1/4: Vital and labs stable, history of advanced dementia with significant cognitive impairment at baseline.  PT is recommending home health. Wife to discuss with son regarding our offer about palliative care, stating that son at this time does not want them to get involved. Neurosurgery is recommending  conservative management with outpatient follow-up.  1/5: Overnight agitation with history of advanced dementia.  Still appears confused, likely close to his baseline.  Repeating CT head to see the stability.  Blood pressure still mildly elevated, losartan  was added yesterday, adding amlodipine  5 mg daily-might need dose titration.  Apparently his antihypertensives were stopped for unknown reason.  Goal blood pressure less than 140.  1/6: Hemodynamically stable, systolic blood pressure at 143 this morning, we have made his amlodipine  10 mg daily and he will continue with current dose of losartan .  Wife was instructed to keep taking his blood pressure to keep it below 140.  He needed close follow-up with primary care provider for better control of his hypertension.  Apparently has stopped taking home antihypertensives.  Repeat CT head with stable subdural hematoma and 4 mm midline shift.  No new changes.  Patient need to follow-up with neurosurgery as outpatient for further recommendations and management of his subdural hematoma.  Patient with significant cognitive impairment with advanced dementia.  Home health services was ordered to help wife to take care of him at home.  Patient will continue on current medications and need to have a close follow-up with his providers for further assistance.  Patient is high risk for readmission and mortality based on his age and significant underlying comorbidities.  Assessment and Plan: * Subdural hematoma (HCC) No history of fall.  May be secondary to significantly elevated blood pressure.  Neurosurgery is recommending conservative management and outpatient follow-up. -Ordered a repeat CT head to see the stability - Will monitor BP and keep systolic BP below 140. -Continue to monitor -PT is recommending home health  Hypertensive urgency Blood pressure mildly elevated.  Patient was not on any antihypertensives at home, apparently they were discontinued. -  His prior dose of amlodipine  at 10 mg daily was resumed along with losartan  50 mg daily.  Goal systolic blood pressure is less than 140.  Dyslipidemia - Will continue statin therapy.  Dementia with behavioral disturbance [facility]) Patient continued to have significant cognitive decline with advanced dementia. - This associated with depression and anxiety. - We will continue Xanax , Seroquel  and Risperdal .  GERD without esophagitis - Will continue PPI therapy.  Consultants: Neurosurgery Procedures performed: None Disposition: Home health Diet recommendation:  Regular diet DISCHARGE MEDICATION: Allergies as of 12/22/2024       Reactions   Codeine Nausea And Vomiting        Medication List     TAKE these medications    acetaminophen  325 MG tablet Commonly known as: TYLENOL  Take 650 mg by mouth every 8 (eight) hours as needed for moderate pain.   amLODipine  10 MG tablet Commonly known as: NORVASC  Take 1 tablet (10 mg total) by mouth daily. Start taking on: December 23, 2024   ascorbic acid  500 MG tablet Commonly known as: VITAMIN C  Take 1 tablet (500 mg total) by mouth 2 (two) times daily.   cyanocobalamin  1000 MCG tablet Commonly known as: VITAMIN B12 Take 1,000 mcg by mouth.  Take 1,000 mcg by mouth once daily   donepezil  10 MG tablet Commonly known as: ARICEPT  Take 10 mg by mouth.  TAKE 1 TABLET(10 MG) BY MOUTH AT BEDTIME   feeding supplement Liqd Take 237 mLs by mouth 3 (three) times daily between meals.   lidocaine  5 % Commonly known as: LIDODERM  Remove & Discard patch within 12 hours or as directed by MD. Apply to area of pain   loperamide  2 MG capsule Commonly known as: IMODIUM  Take 1 capsule (2 mg total) by mouth as needed for diarrhea or loose stools.   losartan  50 MG tablet Commonly known as: COZAAR  Take 1 tablet (50 mg total) by mouth daily. Start taking on: December 23, 2024   multivitamin with minerals Tabs tablet Take 1 tablet by mouth daily.    pantoprazole  40 MG tablet Commonly known as: PROTONIX  Take 1 tablet (40 mg total) by mouth 2 (two) times daily.   QUEtiapine  50 MG tablet Commonly known as: SEROQUEL  Take 1 tablet (50 mg total) by mouth at bedtime. What changed: how much to take   risperiDONE  0.5 MG tablet Commonly known as: RISPERDAL  Take 1 tablet (0.5 mg total) by mouth 2 (two) times daily as needed (agitation).   tamsulosin  0.4 MG Caps capsule Commonly known as: FLOMAX  Take 1 capsule (0.4 mg total) by mouth daily after supper.        Follow-up Information     Lenon [name] ORN, MD. Schedule an appointment as soon as possible for a visit in 1 week(s).   Specialty: Internal Medicine Contact information: [address] [facility] [place] [address] [phone]         Claudene Penne ORN, MD. Schedule an appointment as soon as possible for a visit in 1 week(s).   Specialty: Neurosurgery Contact information: [address] [place] [address] [phone]                Discharge Exam: There were no vitals filed for this visit. General.  Frail elderly man, in no acute distress. Pulmonary.  Lungs clear bilaterally, normal respiratory effort. CV.  Regular rate and rhythm, no JVD, rub or murmur. Abdomen.  Soft, nontender, nondistended, BS positive. CNS.  Alert and oriented to name only.  No focal neurologic deficit. Extremities.  No edema,  pulses intact and symmetrical. Psychiatry.  Judgment and insight appears impaired.  Condition at discharge: stable  The results of significant diagnostics from this hospitalization (including imaging, microbiology, ancillary and laboratory) are listed below for reference.   Imaging Studies: CT HEAD WO CONTRAST ( ) Result Date: 12/21/2024 EXAM: CT HEAD WITHOUT CONTRAST 12/21/2024 05:45:00 PM TECHNIQUE: CT of the head was performed without the administration of intravenous contrast. Automated exposure control,  iterative reconstruction, and/or weight based adjustment of the mA/kV was utilized to reduce the radiation dose to as low as reasonably achievable. COMPARISON: Head CT 12/19/2024. CLINICAL HISTORY: Subdural hematoma; Follow-up of recent subdural hematoma. FINDINGS: BRAIN AND VENTRICLES: A mixed density subdural hematoma over the left cerebral convexity has not significantly changed in size, measuring up to 12 mm in thickness. Mild associated mass effect including 4 mm of rightward midline shift is unchanged. No new intracranial hemorrhage, acute infarct, or hydrocephalus is evident. There is mild global cerebral atrophy as well as more prominent bilateral mesial temporal web volume loss. Patchy hypodensities in the cerebral white matter bilaterally are unchanged and nonspecific but compatible with moderate chronic small vessel ischemic disease. A chronic lacunar infarct is again noted in the right thalamus. Calcified atherosclerosis at the skull base. ORBITS: No acute abnormality. SINUSES: Persistent moderately large right mastoid effusion. New small volume fluid in the left sphenoid sinus. SOFT TISSUES AND SKULL: No acute soft tissue abnormality. No skull fracture. IMPRESSION: 1. Unchanged mixed density subdural hematoma over the left cerebral convexity with 4 mm of rightward midline shift. 2. No new intracranial abnormality. 3. Moderate chronic small vessel ischemic disease. 4. Right mastoid effusion and left sphenoid sinus fluid. Electronically signed by: Dasie Hamburg MD 12/21/2024 06:26 PM EST RP Workstation: [id number]   CT HEAD WO CONTRAST Result Date: 12/19/2024 CLINICAL DATA:  Initial evaluation for acute altered mental status. EXAM: CT HEAD WITHOUT CONTRAST TECHNIQUE: Contiguous axial images were obtained from the base of the skull through the vertex without intravenous contrast. RADIATION DOSE REDUCTION: This exam was performed according to the departmental dose-optimization program which includes  automated exposure control, adjustment of the mA and/or kV according to patient size and/or use of iterative reconstruction technique. COMPARISON:  Prior study from 06/24/2024. FINDINGS: Brain: Age-related cerebral atrophy with moderate chronic microvascular ischemic disease. Remote right thalamic lacunar infarct. No acute large vessel territory infarct. Mixed density subdural hematoma overlying the left cerebral convexity measures up to 1.2 cm in maximal thickness, consistent with a subacute subdural hematoma. Mass effect on the subjacent left cerebral hemisphere with 4 mm left-to-right shift. No hydrocephalus or trapping. Vascular: No abnormal hyperdense vessel. Calcified atherosclerosis present at skull base. Skull: Scalp soft tissues demonstrate no acute finding. Calvarium intact. Sinuses/Orbits: Globes and orbital soft tissues within normal limits. Mild mucosal thickening about the sphenoid ethmoidal sinuses. Moderate right mastoid effusion. Left mastoid air cells are clear. Other: None. IMPRESSION: 1. Subacute subdural hematoma overlying the left cerebral convexity measuring up to 1.2 cm in maximal thickness. Mass effect on the subjacent left cerebral hemisphere with 4 mm left-to-right shift. No hydrocephalus or trapping. 2. Age-related cerebral atrophy with moderate chronic microvascular ischemic disease, with remote right thalamic lacunar infarct. Critical Value/emergent results were called by telephone at the time of interpretation on 12/19/2024 at 8:44 pm to provider [facility] ,  who verbally acknowledged these results. Electronically Signed   By: Morene Hoard M.D.   On: 12/19/2024 20:45    Microbiology: Results for orders placed or performed during the hospital encounter of 10/24/24  MRSA Next Gen by PCR, Nasal     Status: None   Collection Time: 10/24/24  1:10 PM   Specimen: Nasal Mucosa; Nasal Swab  Result Value Ref Range Status   MRSA by PCR Next Gen NOT DETECTED NOT DETECTED Final     Comment: (NOTE) The GeneXpert MRSA Assay (FDA approved for NASAL specimens only), is one component of a comprehensive MRSA colonization surveillance program. It is not intended to diagnose MRSA infection nor to guide or monitor treatment for MRSA infections. Test performance is not FDA approved in patients less than 24 years old. Performed at [facility], [address] [place], [place] [address]   MRSA Next Gen by PCR, Nasal     Status: None   Collection Time: 10/30/24 11:33 AM   Specimen: Nasal Mucosa; Nasal Swab  Result Value Ref Range Status   MRSA by PCR Next Gen NOT DETECTED NOT DETECTED Final    Comment: (NOTE) The GeneXpert MRSA Assay (FDA approved for NASAL specimens only), is one component of a comprehensive MRSA colonization surveillance program. It is not intended to diagnose MRSA infection nor to guide or monitor treatment for MRSA infections. Test performance is not FDA approved in patients less than 18 years old. Performed at [facility], [address] [place], [place] [address]     Labs: CBC: Recent Labs  Lab 12/19/24 1728 12/20/24 0509  WBC 9.4 8.2  HGB 12.0* 12.0*  HCT 38.4* 37.3*  MCV 89.3 88.0  PLT 283 269   Basic Metabolic Panel: Recent Labs  Lab 12/19/24 1728 12/20/24 0509  NA 140 140  K 3.7 3.8  CL 103 104  CO2 20* 22  GLUCOSE 116* 101*  BUN 19 14  CREATININE 1.07 1.06  CALCIUM  9.3 9.7   Liver Function Tests: Recent Labs  Lab 12/19/24 1728  AST 19  ALT 17  ALKPHOS 111  BILITOT 0.3  PROT 7.0  ALBUMIN 4.5   CBG: No results for input(s): GLUCAP in the last 168 hours.  Discharge time spent: greater than 30 minutes.  This record has been created using Conservation officer, historic buildings. Errors have been sought and corrected,but may not always be located. Such creation errors do not reflect on the standard of care.   Signed: Amaryllis Dare, MD [facility] 12/22/2024 "

## 2024-12-23 ENCOUNTER — Other Ambulatory Visit: Payer: Self-pay

## 2024-12-23 MED ORDER — TAMSULOSIN HCL 0.4 MG PO CAPS
0.4000 mg | ORAL_CAPSULE | Freq: Every day | ORAL | 2 refills | Status: DC
  Filled 2024-12-23: qty 30, 30d supply, fill #0

## 2024-12-23 MED ORDER — RISPERIDONE 0.5 MG PO TABS
0.5000 mg | ORAL_TABLET | Freq: Two times a day (BID) | ORAL | 0 refills | Status: DC | PRN
  Filled 2024-12-23: qty 30, 15d supply, fill #0

## 2024-12-23 NOTE — Plan of Care (Signed)

## 2024-12-23 NOTE — Progress Notes (Signed)
 Physical Therapy Treatment Patient Details Name: Albert [name] MRN: [id number] DOB: 04/17/1946 Today's Date: 12/23/2024   History of Present Illness Pt is a 79 y.o. male admitted with SDH (mass effect on the  subjacent left cerebral hemisphere with 4 mm left-to-right shift) & remote R thalamic infarct. Neurosurgery recommending conservative mgmt with outpatient f/u. PMH significant for anxiety, advanced dementia, OA, GERD, HTN, DM, GI bleed, recent UTI.    PT Comments  Pt was supine in bed with RN tech in room and spouse at bedside. Author knows pt from previous admission. He presents at his baseline cognitive state with severe dementia. He is verbal and is able to follow simple commands but requires increased time and constant vcs for redirection and to focus on desired task. Discussed with spouse safety concerns and need for gait belt use. Explained that some days he will do better than others and that she would benefit from assist at home for decreased caregiver burden. Highly recommend WC for home use and longer distances. DC recs remain appropriate to maximize his safe functional mobility with all ADLs.    If plan is discharge home, recommend the following: A lot of help with walking and/or transfers;A lot of help with bathing/dressing/bathroom;Help with stairs or ramp for entrance;Direct supervision/assist for medications management;Assistance with feeding;Assist for transportation;Direct supervision/assist for financial management;Assistance with cooking/housework;Supervision due to cognitive status     Equipment Recommendations  Rolling walker (2 wheels);Wheelchair (measurements PT)       Precautions / Restrictions Precautions Precautions: Fall Recall of Precautions/Restrictions: Impaired Precaution/Restrictions Comments: advanced dementia Restrictions Weight Bearing Restrictions Per Provider Order: No     Mobility  Bed Mobility Overal bed mobility: Needs Assistance Bed  Mobility: Supine to Sit, Sit to Supine  Supine to sit: Min assist, Contact guard Sit to supine: Min assist General bed mobility comments: assistance due to motor planning and cognition deficits. NJOt due to weakness or physical deficits    Transfers Overall transfer level: Needs assistance Equipment used: Rolling walker (2 wheels) Transfers: Sit to/from Stand Sit to Stand: Contact guard assist, Min assist  General transfer comment: CGA for hand on hand placement to RW.    Ambulation/Gait Ambulation/Gait assistance: Contact guard assist, Min assist Gait Distance (Feet): 70 Feet Assistive device: Rolling walker (2 wheels) Gait Pattern/deviations: Step-to pattern, Narrow base of support, Trunk flexed, Scissoring  General Gait Details: Ptr remains at high risk of falls mostly due to cognition and poor awareness. Poor safety throughout ambulation.    Balance Overall balance assessment: Needs assistance Sitting-balance support: Feet supported Sitting balance-Leahy Scale: Fair  Standing balance support: Bilateral upper extremity supported, During functional activity, Reliant on assistive device for balance Standing balance-Leahy Scale: Poor Standing balance comment: Pt remains at high risk of falls     Communication Communication Communication: Impaired  Cognition Arousal: Alert Behavior During Therapy: Flat affect   PT - Cognitive impairments: History of cognitive impairments    PT - Cognition Comments: Baseline dementia. Seems close to the same as he was last admission Following commands: Impaired Following commands impaired: Follows one step commands inconsistently    Cueing Cueing Techniques: Verbal cues, Tactile cues, Visual cues     General Comments General comments (skin integrity, edema, etc.): Issued gait belt for home use and encouraged spouse to consider placment in LTC/memory unit setting      Pertinent Vitals/Pain Pain Assessment Pain Assessment: No/denies pain      PT Goals (current goals can now be found in the care  plan section) Acute Rehab PT Goals Patient Stated Goal: none stated Progress towards PT goals: Progressing toward goals    Frequency    Min 2X/week       AM-PAC PT 6 Clicks Mobility   Outcome Measure  Help needed turning from your back to your side while in a flat bed without using bedrails?: None Help needed moving from lying on your back to sitting on the side of a flat bed without using bedrails?: A Little Help needed moving to and from a bed to a chair (including a wheelchair)?: A Little Help needed standing up from a chair using your arms (e.g., wheelchair or bedside chair)?: A Little Help needed to walk in hospital room?: A Little Help needed climbing 3-5 steps with a railing? : A Lot 6 Click Score: 18    End of Session Equipment Utilized During Treatment: Gait belt Activity Tolerance: Patient tolerated treatment well Patient left: in bed;with call bell/phone within reach;with bed alarm set;with family/visitor present;with nursing/sitter in room Psychiatrist present) Nurse Communication: Mobility status PT Visit Diagnosis: Other abnormalities of gait and mobility (R26.89);Difficulty in walking, not elsewhere classified (R26.2)     Time: 1035-1050 PT Time Calculation (min) (ACUTE ONLY): 15 min  Charges:    $Gait Training: 8-22 mins PT General Charges $$ ACUTE PT VISIT: 1 Visit                     Rankin Essex PTA 12/23/2024, 11:26 AM

## 2024-12-23 NOTE — TOC Progression Note (Addendum)
 Transition of Care (TOC) - Progression Note    Patient Details  Name: Albert [name] MRN: [id number] Date of Birth: [birth year]  Transition of Care [facility]) CM/SW Contact  Shasta DELENA Daring, RN Phone Number: 12/23/2024, 10:51 AM  Clinical Narrative:    Met with patient's wife. Discussed current discharge plan which includes home health services of PT, OT, RN and an aide.  Discussed why SNF is not appropriate at this time. She maintains that she does not want to start hospice benefits at this time.  She expressed interest in having a wheelchair to assist with mobility.    Mrs. Wert said she can call her sons or her brother in law to assist with getting patient in to the apartment once they get home. She will drive him home.  Will update [facility] of discharge plans.      Expected Discharge Plan: Home w Home Health Services Barriers to Discharge: Barriers Resolved               Expected Discharge Plan and Services In-house Referral: Clinical Social Work Discharge Planning Services: CM Consult Post Acute Care Choice: Home Health Living arrangements for the past 2 months: Apartment Expected Discharge Date: 12/22/24                         HH Arranged: PT, RN, OT, Nurse's Aide HH Agency: [facility] Date [facility] Agency Contacted: 12/22/24 Time HH Agency Contacted: 1112 Representative spoke with at [facility] Agency: Channing Ee   Social Drivers of Health (SDOH) Interventions SDOH Screenings   Food Insecurity: Patient Unable To Answer (12/20/2024)  Housing: Patient Unable To Answer (12/20/2024)  Transportation Needs: Patient Unable To Answer (12/20/2024)  Utilities: Patient Unable To Answer (12/20/2024)  Financial Resource Strain: Low Risk  (08/31/2024)   Received from [facility]  Social Connections: Patient Unable To Answer (12/20/2024)  Tobacco Use: High Risk (12/20/2024)    Readmission Risk Interventions    12/21/2024   11:16 AM 10/30/2024   12:32 PM   Readmission Risk Prevention Plan  Transportation Screening Complete Complete  PCP or Specialist Appt within 5-7 Days  Complete  PCP or Specialist Appt within 3-5 Days Complete   Home Care Screening  Complete  Medication Review (RN CM)  Complete  HRI or Home Care Consult Complete   Social Work Consult for Recovery Care Planning/Counseling Complete   Palliative Care Screening Not Applicable   Medication Review Oceanographer) Complete

## 2024-12-23 NOTE — TOC CM/SW Note (Signed)
 Wheelchair  The patient mobility limitation cannot be resolved by the use of a cane or walker. The patient has enough upper extremity strength and mental capacities needed to safely propel the wheelchair on a given day. Also, the patient has a caregiver willing to provide assistance if needed.

## 2024-12-26 ENCOUNTER — Other Ambulatory Visit: Payer: Self-pay

## 2024-12-26 ENCOUNTER — Inpatient Hospital Stay
Admission: EM | Admit: 2024-12-26 | Discharge: 2025-01-17 | DRG: 951 | Disposition: E | Attending: Internal Medicine | Admitting: Internal Medicine

## 2024-12-26 ENCOUNTER — Emergency Department

## 2024-12-26 DIAGNOSIS — R32 Unspecified urinary incontinence: Secondary | ICD-10-CM | POA: Diagnosis present

## 2024-12-26 DIAGNOSIS — F05 Delirium due to known physiological condition: Secondary | ICD-10-CM | POA: Diagnosis present

## 2024-12-26 DIAGNOSIS — E872 Acidosis, unspecified: Secondary | ICD-10-CM | POA: Diagnosis present

## 2024-12-26 DIAGNOSIS — Z7401 Bed confinement status: Secondary | ICD-10-CM | POA: Diagnosis not present

## 2024-12-26 DIAGNOSIS — G309 Alzheimer's disease, unspecified: Secondary | ICD-10-CM | POA: Diagnosis present

## 2024-12-26 DIAGNOSIS — F0280 Dementia in other diseases classified elsewhere without behavioral disturbance: Secondary | ICD-10-CM | POA: Diagnosis present

## 2024-12-26 DIAGNOSIS — A419 Sepsis, unspecified organism: Secondary | ICD-10-CM | POA: Diagnosis present

## 2024-12-26 DIAGNOSIS — E870 Hyperosmolality and hypernatremia: Secondary | ICD-10-CM | POA: Diagnosis present

## 2024-12-26 DIAGNOSIS — K2270 Barrett's esophagus without dysplasia: Secondary | ICD-10-CM | POA: Diagnosis present

## 2024-12-26 DIAGNOSIS — Z8261 Family history of arthritis: Secondary | ICD-10-CM | POA: Diagnosis not present

## 2024-12-26 DIAGNOSIS — Z800 Family history of malignant neoplasm of digestive organs: Secondary | ICD-10-CM | POA: Diagnosis not present

## 2024-12-26 DIAGNOSIS — B999 Unspecified infectious disease: Secondary | ICD-10-CM

## 2024-12-26 DIAGNOSIS — Z1152 Encounter for screening for COVID-19: Secondary | ICD-10-CM

## 2024-12-26 DIAGNOSIS — Z8673 Personal history of transient ischemic attack (TIA), and cerebral infarction without residual deficits: Secondary | ICD-10-CM

## 2024-12-26 DIAGNOSIS — Z89421 Acquired absence of other right toe(s): Secondary | ICD-10-CM

## 2024-12-26 DIAGNOSIS — R4182 Altered mental status, unspecified: Secondary | ICD-10-CM | POA: Diagnosis present

## 2024-12-26 DIAGNOSIS — E785 Hyperlipidemia, unspecified: Secondary | ICD-10-CM | POA: Diagnosis present

## 2024-12-26 DIAGNOSIS — F17210 Nicotine dependence, cigarettes, uncomplicated: Secondary | ICD-10-CM | POA: Diagnosis present

## 2024-12-26 DIAGNOSIS — S065XAA Traumatic subdural hemorrhage with loss of consciousness status unknown, initial encounter: Secondary | ICD-10-CM

## 2024-12-26 DIAGNOSIS — R627 Adult failure to thrive: Secondary | ICD-10-CM | POA: Diagnosis present

## 2024-12-26 DIAGNOSIS — Z79899 Other long term (current) drug therapy: Secondary | ICD-10-CM

## 2024-12-26 DIAGNOSIS — Z515 Encounter for palliative care: Secondary | ICD-10-CM | POA: Diagnosis not present

## 2024-12-26 DIAGNOSIS — I10 Essential (primary) hypertension: Secondary | ICD-10-CM | POA: Diagnosis present

## 2024-12-26 DIAGNOSIS — Z885 Allergy status to narcotic agent status: Secondary | ICD-10-CM

## 2024-12-26 DIAGNOSIS — Z66 Do not resuscitate: Secondary | ICD-10-CM | POA: Diagnosis present

## 2024-12-26 DIAGNOSIS — Z860100 Personal history of colon polyps, unspecified: Secondary | ICD-10-CM | POA: Diagnosis not present

## 2024-12-26 DIAGNOSIS — Z87442 Personal history of urinary calculi: Secondary | ICD-10-CM | POA: Diagnosis not present

## 2024-12-26 DIAGNOSIS — I6200 Nontraumatic subdural hemorrhage, unspecified: Secondary | ICD-10-CM | POA: Diagnosis present

## 2024-12-26 DIAGNOSIS — Z981 Arthrodesis status: Secondary | ICD-10-CM

## 2024-12-26 DIAGNOSIS — G9341 Metabolic encephalopathy: Secondary | ICD-10-CM | POA: Diagnosis present

## 2024-12-26 LAB — CBC WITH DIFFERENTIAL/PLATELET
Abs Immature Granulocytes: 0.13 K/uL — ABNORMAL HIGH (ref 0.00–0.07)
Basophils Absolute: 0 K/uL (ref 0.0–0.1)
Basophils Relative: 0 %
Eosinophils Absolute: 0 K/uL (ref 0.0–0.5)
Eosinophils Relative: 0 %
HCT: 39.3 % (ref 39.0–52.0)
Hemoglobin: 12.4 g/dL — ABNORMAL LOW (ref 13.0–17.0)
Immature Granulocytes: 1 %
Lymphocytes Relative: 11 %
Lymphs Abs: 1.4 K/uL (ref 0.7–4.0)
MCH: 27.7 pg (ref 26.0–34.0)
MCHC: 31.6 g/dL (ref 30.0–36.0)
MCV: 87.9 fL (ref 80.0–100.0)
Monocytes Absolute: 0.9 K/uL (ref 0.1–1.0)
Monocytes Relative: 7 %
Neutro Abs: 10 K/uL — ABNORMAL HIGH (ref 1.7–7.7)
Neutrophils Relative %: 81 %
Platelets: 285 K/uL (ref 150–400)
RBC: 4.47 MIL/uL (ref 4.22–5.81)
RDW: 16.7 % — ABNORMAL HIGH (ref 11.5–15.5)
WBC: 12.4 K/uL — ABNORMAL HIGH (ref 4.0–10.5)
nRBC: 0 % (ref 0.0–0.2)

## 2024-12-26 LAB — URINALYSIS, W/ REFLEX TO CULTURE (INFECTION SUSPECTED)
Bacteria, UA: NONE SEEN
Bilirubin Urine: NEGATIVE
Glucose, UA: NEGATIVE mg/dL
Hgb urine dipstick: NEGATIVE
Ketones, ur: 5 mg/dL — AB
Leukocytes,Ua: NEGATIVE
Nitrite: NEGATIVE
Protein, ur: NEGATIVE mg/dL
Specific Gravity, Urine: 1.02 (ref 1.005–1.030)
pH: 5 (ref 5.0–8.0)

## 2024-12-26 LAB — COMPREHENSIVE METABOLIC PANEL WITH GFR
ALT: 27 U/L (ref 0–44)
AST: 23 U/L (ref 15–41)
Albumin: 4.4 g/dL (ref 3.5–5.0)
Alkaline Phosphatase: 141 U/L — ABNORMAL HIGH (ref 38–126)
Anion gap: 15 (ref 5–15)
BUN: 25 mg/dL — ABNORMAL HIGH (ref 8–23)
CO2: 21 mmol/L — ABNORMAL LOW (ref 22–32)
Calcium: 10.1 mg/dL (ref 8.9–10.3)
Chloride: 110 mmol/L (ref 98–111)
Creatinine, Ser: 1.19 mg/dL (ref 0.61–1.24)
GFR, Estimated: >60 mL/min
Glucose, Bld: 159 mg/dL — ABNORMAL HIGH (ref 70–99)
Potassium: 3.8 mmol/L (ref 3.5–5.1)
Sodium: 146 mmol/L — ABNORMAL HIGH (ref 135–145)
Total Bilirubin: 1 mg/dL (ref 0.0–1.2)
Total Protein: 7.7 g/dL (ref 6.5–8.1)

## 2024-12-26 LAB — RESP PANEL BY RT-PCR (RSV, FLU A&B, COVID)  RVPGX2
Influenza A by PCR: NEGATIVE
Influenza B by PCR: NEGATIVE
Resp Syncytial Virus by PCR: NEGATIVE
SARS Coronavirus 2 by RT PCR: NEGATIVE

## 2024-12-26 LAB — PROTIME-INR
INR: 1.1 (ref 0.8–1.2)
Prothrombin Time: 14.9 s (ref 11.4–15.2)

## 2024-12-26 LAB — AMMONIA: Ammonia: 15 umol/L (ref 9–35)

## 2024-12-26 LAB — OSMOLALITY: Osmolality: 310 mosm/kg — ABNORMAL HIGH (ref 275–295)

## 2024-12-26 LAB — LACTIC ACID, PLASMA: Lactic Acid, Venous: 2 mmol/L (ref 0.5–1.9)

## 2024-12-26 MED ORDER — GLYCOPYRROLATE 0.2 MG/ML IJ SOLN
0.2000 mg | INTRAMUSCULAR | Status: DC | PRN
  Filled 2024-12-26 (×2): qty 1

## 2024-12-26 MED ORDER — LORAZEPAM 2 MG/ML PO CONC: 1.0000 mg | ORAL | Status: DC | PRN

## 2024-12-26 MED ORDER — MORPHINE BOLUS VIA INFUSION
4.0000 mg | INTRAVENOUS | Status: DC | PRN
  Administered 2024-12-26 – 2024-12-27 (×8): 4 mg via INTRAVENOUS

## 2024-12-26 MED ORDER — MORPHINE 100MG IN NS 100ML (1MG/ML) PREMIX INFUSION
5.0000 mg/h | INTRAVENOUS | Status: DC
  Administered 2024-12-26: 5 mg/h via INTRAVENOUS
  Administered 2024-12-27: 7 mg/h via INTRAVENOUS
  Filled 2024-12-26 (×2): qty 100

## 2024-12-26 MED ORDER — LORAZEPAM 2 MG/ML IJ SOLN
1.0000 mg | INTRAMUSCULAR | Status: DC | PRN
  Administered 2024-12-26 (×2): 1 mg via INTRAVENOUS
  Filled 2024-12-26 (×2): qty 1

## 2024-12-26 MED ORDER — LORAZEPAM 1 MG PO TABS: 1.0000 mg | ORAL_TABLET | ORAL | Status: DC | PRN

## 2024-12-26 MED ORDER — HALOPERIDOL LACTATE 2 MG/ML PO CONC: 0.5000 mg | ORAL | Status: DC | PRN

## 2024-12-26 MED ORDER — SODIUM CHLORIDE 0.9 % IV SOLN
2.0000 g | Freq: Once | INTRAVENOUS | Status: DC
  Filled 2024-12-26: qty 20

## 2024-12-26 MED ORDER — MORPHINE SULFATE (PF) 2 MG/ML IV SOLN
1.0000 mg | INTRAVENOUS | Status: DC | PRN
  Administered 2024-12-26 (×4): 4 mg via INTRAVENOUS
  Filled 2024-12-26 (×4): qty 2

## 2024-12-26 MED ORDER — GLYCOPYRROLATE 0.2 MG/ML IJ SOLN: 0.2000 mg | INTRAMUSCULAR | Status: DC | PRN

## 2024-12-26 MED ORDER — SODIUM CHLORIDE 0.9 % IV BOLUS
500.0000 mL | Freq: Once | INTRAVENOUS | Status: AC
  Administered 2024-12-26: 500 mL via INTRAVENOUS

## 2024-12-26 MED ORDER — DIPHENHYDRAMINE HCL 50 MG/ML IJ SOLN: 12.5000 mg | INTRAMUSCULAR | Status: DC | PRN

## 2024-12-26 MED ORDER — ONDANSETRON HCL 4 MG/2ML IJ SOLN: 4.0000 mg | Freq: Four times a day (QID) | INTRAMUSCULAR | Status: DC | PRN

## 2024-12-26 MED ORDER — HALOPERIDOL LACTATE 5 MG/ML IJ SOLN
0.5000 mg | INTRAMUSCULAR | Status: DC | PRN
  Administered 2024-12-26: 0.5 mg via INTRAVENOUS
  Filled 2024-12-26: qty 1

## 2024-12-26 MED ORDER — HALOPERIDOL 0.5 MG PO TABS: 0.5000 mg | ORAL_TABLET | ORAL | Status: DC | PRN

## 2024-12-26 MED ORDER — SODIUM CHLORIDE 0.9 % IV SOLN
100.0000 mg | Freq: Two times a day (BID) | INTRAVENOUS | Status: DC
  Filled 2024-12-26: qty 100

## 2024-12-26 MED ORDER — ONDANSETRON 4 MG PO TBDP: 4.0000 mg | ORAL_TABLET | Freq: Four times a day (QID) | ORAL | Status: DC | PRN

## 2024-12-26 MED ORDER — GLYCOPYRROLATE 1 MG PO TABS: 1.0000 mg | ORAL_TABLET | ORAL | Status: DC | PRN

## 2024-12-26 MED ORDER — SENNA 8.6 MG PO TABS: 1.0000 | ORAL_TABLET | Freq: Every evening | ORAL | Status: DC | PRN

## 2024-12-26 MED ORDER — AZITHROMYCIN 500 MG PO TABS: 500.0000 mg | ORAL_TABLET | Freq: Once | ORAL | Status: DC

## 2024-12-26 NOTE — ED Provider Notes (Signed)
 "  [facility] Provider Note    Event Date/Time   First MD Initiated Contact with Patient 12/26/24 0840     (approximate)   History   Altered Mental Status  Pt BIB [facility] from home as a sepsis alert. According to EMS, pt with hx dementia and recent brain bleed with admission last week, worsening mental status since D/C from hospital 3 days ago. EMS reports strong smell of urine.    HPI Albert [name] is a 79 y.o. male PMH dementia, anxiety, GERD, hypertension, hyperlipidemia, urolithiasis, recent admission for subdural hematoma 12/19/2024-12/22/2024 presents for evaluation of altered mental status - Activated as code sepsis by EMS.  Per EMS, patient has been having worsening mental status since his return home.  He is able to ambulate short distances but has become incontinent of urine and is reportedly more confused and less interactive over this timeframe per wife.  She does state DNR/DNI status to them.  Vital signs with temperature 97.1, some tachycardia to 120.  They did note foul-smelling urine on scene.  Blood pressure originally normotensive then somewhat soft, they gave -On my evaluation, patient is alert but a noncontributory historian - Patient's wife and sister arrived shortly after patient.  They tell me patient was verbal and able to walk around the home albeit unsteadily after returning from hospital.  Over the past 2 days he has become notably more altered, not responding verbally to most cues, appears to be speaking with their dog who died at least several months ago.  Has had some malodorous urine.  Noted he was breathing very shallow today. - Confirms DNR/DNI status.  Would be open to further discussions of hospice if serious pathology identified today. - Has not gotten out of bed for the past 2 days   Recent discharge summary reviewed.  Appears subdural hematoma was atraumatic, possibly secondary to elevated blood pressure.  Neurosurgery recommended  conservative management and outpatient follow-up.     Physical Exam   Triage Vital Signs: BP (!) 166/109 (BP Location: Left Arm)   Pulse (!) 105   Temp 99 F (37.2 C)   Resp 14   Wt 79.5 kg   SpO2 94%   BMI 22.50 kg/m     Most recent vital signs: Vitals:   12/26/24 1245 12/26/24 1307  BP:  (!) 166/109  Pulse: (!) 102 (!) 105  Resp: (!) 26 14  Temp:  99 F (37.2 C)  SpO2: 97% 94%     General: Awake, no distress.  HEENT: Normocephalic, atraumatic CV:  Good peripheral perfusion.  Tachycardia, regular rhythm, RP 2+ Resp:  Mild tachypnea, CTAB Abd:  No distention. Nontender to deep palpation throughout Neuro:  Alert, unclear orientation, does follow basic commands and answer some simple questions.  No obvious facial asymmetry.  Moving all extremities spontaneously.  Otherwise not interactive with neurologic exam.   ED Results / Procedures / Treatments   Labs (all labs ordered are listed, but only abnormal results are displayed) Labs Reviewed  LACTIC ACID, PLASMA - Abnormal; Notable for the following components:      Result Value   Lactic Acid, Venous 2.0 (*)    All other components within normal limits  COMPREHENSIVE METABOLIC PANEL WITH GFR - Abnormal; Notable for the following components:   Sodium 146 (*)    CO2 21 (*)    Glucose, Bld 159 (*)    BUN 25 (*)    Alkaline Phosphatase 141 (*)    All  other components within normal limits  CBC WITH DIFFERENTIAL/PLATELET - Abnormal; Notable for the following components:   WBC 12.4 (*)    Hemoglobin 12.4 (*)    RDW 16.7 (*)    Neutro Abs 10.0 (*)    Abs Immature Granulocytes 0.13 (*)    All other components within normal limits  URINALYSIS, W/ REFLEX TO CULTURE (INFECTION SUSPECTED) - Abnormal; Notable for the following components:   Color, Urine YELLOW (*)    APPearance CLEAR (*)    Ketones, ur 5 (*)    All other components within normal limits  OSMOLALITY - Abnormal; Notable for the following components:    Osmolality 310 (*)    All other components within normal limits  RESP PANEL BY RT-PCR (RSV, FLU A&B, COVID)  RVPGX2  CULTURE, BLOOD (ROUTINE X 2)  CULTURE, BLOOD (ROUTINE X 2)  PROTIME-INR  AMMONIA     EKG  Ecg = sinus tachycardia, rate 111, no gross ST elevation, trace depressions noted in lead II.  Left axis deviation, normal intervals.  Interpretation somewhat limited by artifact.  No clear evidence of ischemia nor arrhythmia on my interpretation.   RADIOLOGY Radiology interpreted by myself and radiology report reviewed.  Subdural hematoma smaller in size, no new bleeding identified.  Chest x-ray with possible pneumonia.    PROCEDURES:  Critical Care performed: Yes, see critical care procedure note(s)  .Critical Care  Performed by: Clarine Ozell LABOR, MD Authorized by: Clarine Ozell LABOR, MD   Critical care provider statement:    Critical care time (minutes):  30   Critical care was necessary to treat or prevent imminent or life-threatening deterioration of the following conditions:  Sepsis   Critical care was time spent personally by me on the following activities:  Development of treatment plan with patient or surrogate, discussions with consultants, evaluation of patient's response to treatment, examination of patient, ordering and review of laboratory studies, ordering and review of radiographic studies, ordering and performing treatments and interventions, pulse oximetry, re-evaluation of patient's condition and review of old charts    MEDICATIONS ORDERED IN ED: Medications  LORazepam  (ATIVAN ) tablet 1 mg ( Oral See Alternative 12/26/24 1214)    Or  LORazepam  (ATIVAN ) 2 MG/ML concentrated solution 1 mg ( Sublingual See Alternative 12/26/24 1214)    Or  LORazepam  (ATIVAN ) injection 1 mg (1 mg Intravenous Given 12/26/24 1214)  haloperidol  (HALDOL ) tablet 0.5 mg ( Oral See Alternative 12/26/24 1150)    Or  haloperidol  (HALDOL ) 2 MG/ML solution 0.5 mg ( Sublingual See  Alternative 12/26/24 1150)    Or  haloperidol  lactate (HALDOL ) injection 0.5 mg (0.5 mg Intravenous Given 12/26/24 1150)  diphenhydrAMINE  (BENADRYL ) injection 12.5 mg (has no administration in time range)  senna (SENOKOT) tablet 8.6 mg (has no administration in time range)  ondansetron  (ZOFRAN -ODT) disintegrating tablet 4 mg (has no administration in time range)    Or  ondansetron  (ZOFRAN ) injection 4 mg (has no administration in time range)  glycopyrrolate  (ROBINUL ) tablet 1 mg (has no administration in time range)    Or  glycopyrrolate  (ROBINUL ) injection 0.2 mg (has no administration in time range)    Or  glycopyrrolate  (ROBINUL ) injection 0.2 mg (has no administration in time range)  morphine  100mg  in NS (1mg /mL) infusion - premix (5 mg/hr Intravenous New Bag/Given 12/26/24 1407)  morphine  bolus via infusion 4 mg (has no administration in time range)  sodium chloride  0.9 % bolus 500 mL (0 mLs Intravenous Stopped 12/26/24 1051)  sodium chloride  0.9 %  bolus 500 mL (0 mLs Intravenous Stopped 12/26/24 1209)     IMPRESSION / MDM / ASSESSMENT AND PLAN / ED COURSE  I reviewed the triage vital signs and the nursing notes.                              DDX/MDM/AP: Differential diagnosis includes, but is not limited to, worsening intracranial bleed, consider subsequent SIADH, consider other metabolic or infectious encephalopathy.  No findings or history to suggest seizure episodes.  Plan: - Labs  - Chest x-ray - CT head - Small bolus IV fluid - EKG - Reassess, anticipate admission  Patient's presentation is most consistent with acute presentation with potential threat to life or bodily function.  The patient is on the cardiac monitor to evaluate for evidence of arrhythmia and/or significant heart rate changes.  ED course below.  Workup notable for new leukocytosis with left shift, lactic acidosis, chest x-ray concerning for possible pneumonia.  Given history of recent cough and  increased work of breathing do suspect likely true underlying pneumonia and does meet criteria for sepsis, plan for treatment with ceftriaxone  and azithromycin .  Given reduced modified fluid bolus of 1000 cc, not meeting septic shock criteria.  Did have extensive goals of care discussion with multiple family members who understand patient's is now quite chronically ill and nearing the end of his life.  They are interested in further discussions of hospice though do not feel they can appropriately manage patient's high level of care at home.  Admitted to hospitalist service, and on further discussion was transitioned to comfort care, antibiotics withdrawn.  Clinical Course as of 12/26/24 1459  Sat Dec 26, 2024  0920 CBC with mild leukocytosis [MM]  0935 CXR: IMPRESSION: 1. Minimal streaky density in the retrocardiac left base may be atelectasis although infiltrate not excluded. 2. Lucency overlying the right supraclavicular region and shoulder raising the question of soft tissue gas although this could potentially be related to artifact from superimposed clothing. Clinical correlation recommended.   [MM]  [phone] CMP reviewed, very mild hyponatremia and mildly low bicarb, getting IV fluid.  Otherwise unremarkable. [MM]  1027 UA not c/w infxn [MM]  1027 CTH: IMPRESSION: 1. Mixed-density left subdural hematoma is 1-2 mm smaller (now 9-11 mm). Mild mass effect, no significant midline shift. 2. No new intracranial abnormality. Chronic small vessel disease.   [MM]  1028 Ammonia wnl [MM]  1029 Rectal temperature 100.1.  Given new report of increased work of breathing and worsening altered mental status, do suspect genuine underlying pneumonia with infectious encephalopathy.  Does meet sepsis criteria with tachycardia, WBC greater than 12, lactic acidosis.  Also tachypneic here.  Will give further 500 cc IV fluid (total 1000 cc) and desire to avoid hypervolemia in this elderly patient not meeting  septic shock criteria.  Treating with ceftriaxone  and azithromycin   CT head stable/improved from prior.  Hospitalist consult order placed. [MM]    Clinical Course User Index [MM] Clarine Ozell LABOR, MD     FINAL CLINICAL IMPRESSION(S) / ED DIAGNOSES   Final diagnoses:  Altered mental status, unspecified altered mental status type  Infectious encephalopathy  Sepsis, due to unspecified organism, unspecified whether acute organ dysfunction present (HCC)  Subdural hematoma (HCC)     Rx / DC Orders   ED Discharge Orders     None        Note:  This document was prepared using Dragon voice recognition software  and may include unintentional dictation errors.   Clarine Ozell LABOR, MD 12/26/24 1459  "

## 2024-12-26 NOTE — ED Triage Notes (Signed)
 Pt BIB [facility] from home as a sepsis alert. According to EMS, pt with hx dementia and recent brain bleed with admission last week, worsening mental status since D/C from hospital 3 days ago. EMS reports strong smell of urine.

## 2024-12-26 NOTE — ED Notes (Signed)
 Family requesting to speak with MD regarding possible transition to hospice/comfort care prior to starting abx. Secure chat sent to hospitalist, EDP made aware.

## 2024-12-26 NOTE — Plan of Care (Signed)
   Problem: Safety: Goal: Ability to remain free from injury will improve Outcome: Progressing

## 2024-12-26 NOTE — H&P (Signed)
 " History and Physical    Patient: Albert [name] FMW:[id number] DOB: 05-31-1946 DOA: 12/26/2024 DOS: the patient was seen and examined on 12/26/2024 PCP: Lenon [name] ORN, MD  Patient coming from: Home  Chief Complaint:  Chief Complaint  Patient presents with   Altered Mental Status   HPI: Albert [name] is a 79 y.o. male with medical history significant of advanced dementia, essential hypertension, history of stroke, who presents to the hospital with failure to thrive and increased confusion.  Patient was recently hospitalized and discharged 4 days ago, he had a small subdural hematoma after the fall.  Repeat CT scan after arriving today showed smaller hematoma.  However, patient condition has been deteriorating.  Patient has not been eating, he has been bedbound.  He is totally confused.  He sleeps 18 hours a day.  Apparently, patient has reached the end of his life, after discussion with the family, decision is made to transition to comfort care. Review of Systems: As mentioned in the history of present illness. All other systems reviewed and are negative. Past Medical History:  Diagnosis Date   Anemia    Anxiety    a.) on BZO (alproazolam) PRN   Arthritis of both knees    Back pain    Barrett esophagus    Chronic back pain    Dementia (HCC)    arising in the senium and presenium   Displaced fracture of proximal phalanx of right lesser toe(s), initial encounter for closed fracture    Diverticulosis    GERD (gastroesophageal reflux disease)    OTC meds as needed   Headache    History of colon polyps    benign    History of kidney stones    History of migraine    Hyperlipidemia    Hypertension    Insomnia    Joint pain    Nephrolithiasis    Pre-diabetes    Stroke [facility])    without residual deficits   Past Surgical History:  Procedure Laterality Date   AMPUTATION TOE Right 04/13/2022   Procedure: 28820 - AMPUTATION TOE;  Surgeon: Ashley Soulier, DPM;   Location: [facility] ORS;  Service: Podiatry;  Laterality: Right;   ANKLE ARTHROSCOPY Right 04/13/2022   Procedure: A-SCOPE/OCD REPAIR;  Surgeon: Ashley Soulier, DPM;  Location: [facility] ORS;  Service: Podiatry;  Laterality: Right;   ANKLE SURGERY Right 1997   ankles/screws    ARTHROSCOPIC REPAIR ACL Right 1993   BACK SURGERY  2017   titanium rods and pins in lower back   COLONOSCOPY     ENDOVASCULAR STENT GRAFT (AAA) N/A 10/21/2024   Procedure: ENDOVASCULAR STENT GRAFT (AAA);  Surgeon: Marea Selinda RAMAN, MD;  Location: [facility] INVASIVE CV LAB;  Service: Cardiovascular;  Laterality: N/A;   EPIDIDYMECTOMY Left 04/10/2017   Procedure: EPIDIDYMECTOMY;  Surgeon: Rosina Riis, MD;  Location: [facility] ORS;  Service: Urology;  Laterality: Left;   ESOPHAGOGASTRODUODENOSCOPY     ESOPHAGOGASTRODUODENOSCOPY N/A 10/24/2024   Procedure: EGD (ESOPHAGOGASTRODUODENOSCOPY);  Surgeon: Onita Elspeth Sharper, DO;  Location: [facility] ENDOSCOPY;  Service: Gastroenterology;  Laterality: N/A;   ESOPHAGOGASTRODUODENOSCOPY N/A 10/25/2024   Procedure: EGD (ESOPHAGOGASTRODUODENOSCOPY);  Surgeon: Onita Elspeth Sharper, DO;  Location: [facility] ENDOSCOPY;  Service: Gastroenterology;  Laterality: N/A;  in ICU   IR EMBO ART  VEN HEMORR LYMPH EXTRAV  INC GUIDE ROADMAPPING  10/24/2024   LUMBAR FUSION  March 13, 2016   L4/L5 fusion   MINOR HARDWARE REMOVAL Right 04/13/2022   Procedure: REMOVAL SCREW; DEEP;  Surgeon: Ashley Soulier, DPM;  Location: [facility] ORS;  Service: Podiatry;  Laterality: Right;   TONSILLECTOMY  1962   adenoidectomy    VASECTOMY  1973   WOUND DEBRIDEMENT Right 04/13/2022   Procedure: 29906 - SUBTALAR ARTHROSCOPY;  Surgeon: Ashley Soulier, DPM;  Location: [facility] ORS;  Service: Podiatry;  Laterality: Right;   Social History:  reports that he has been smoking cigarettes. He has a 25 pack-year smoking history. He has never used smokeless tobacco. He reports that he does not drink alcohol and does not use drugs.  Allergies[1]  Family History   Problem Relation Age of Onset   Arthritis Mother    Esophageal cancer Father    Prostate cancer Neg Hx    Bladder Cancer Neg Hx    Kidney cancer Neg Hx     Prior to Admission medications  Medication Sig Start Date End Date Taking? Authorizing Provider  acetaminophen  (TYLENOL ) 325 MG tablet Take 650 mg by mouth every 8 (eight) hours as needed for moderate pain.   Yes [provider]  amLODipine  (NORVASC ) 10 MG tablet Take 1 tablet (10 mg total) by mouth daily. 12/23/24  Yes Caleen Qualia, MD  ascorbic acid  (VITAMIN C ) 500 MG tablet Take 1 tablet (500 mg total) by mouth 2 (two) times daily. 11/10/24  Yes Wieting, Richard, MD  cyanocobalamin  (VITAMIN B12) 1000 MCG tablet Take 1,000 mcg by mouth.  Take 1,000 mcg by mouth once daily   Yes [provider]  donepezil  (ARICEPT ) 10 MG tablet Take 10 mg by mouth.  TAKE 1 TABLET(10 MG) BY MOUTH AT BEDTIME 04/23/24  Yes [provider]  loperamide  (IMODIUM ) 2 MG capsule Take 1 capsule (2 mg total) by mouth as needed for diarrhea or loose stools. 11/10/24  Yes Wieting, Richard, MD  Multiple Vitamin (MULTIVITAMIN WITH MINERALS) TABS tablet Take 1 tablet by mouth daily.   Yes [provider]  pantoprazole  (PROTONIX ) 40 MG tablet Take 1 tablet (40 mg total) by mouth 2 (two) times daily. 11/10/24  Yes Wieting, Richard, MD  QUEtiapine  (SEROQUEL ) 50 MG tablet Take 1 tablet (50 mg total) by mouth at bedtime. Patient taking differently: Take 25 mg by mouth at bedtime. 11/10/24  Yes Wieting, Richard, MD  feeding supplement (ENSURE PLUS HIGH PROTEIN) LIQD Take 237 mLs by mouth 3 (three) times daily between meals. 11/10/24   Josette Ade, MD  lidocaine  (LIDODERM ) 5 % Remove & Discard patch within 12 hours or as directed by MD. Apply to area of pain Patient not taking: Reported on 12/20/2024 11/10/24   Josette Ade, MD  losartan  (COZAAR ) 50 MG tablet Take 1 tablet (50 mg total) by mouth daily. 12/23/24   Amin, Sumayya, MD   risperiDONE  (RISPERDAL ) 0.5 MG tablet Take 1 tablet (0.5 mg total) by mouth 2 (two) times daily as needed (agitation). 12/23/24   Caleen Qualia, MD  tamsulosin  (FLOMAX ) 0.4 MG CAPS capsule Take 1 capsule (0.4 mg total) by mouth daily after supper. 12/23/24   Caleen Qualia, MD    Physical Exam: Vitals:   12/26/24 0840 12/26/24 0844 12/26/24 0845 12/26/24 0915  BP:  (!) 168/105 (!) 169/96   Pulse:  (!) 116 (!) 109   Resp:  (!) 34 (!) 29   Temp:    100.1 F (37.8 C)  TempSrc:    Rectal  SpO2: 93% 95% 95%   Weight:   79.5 kg    Physical Exam Constitutional:      Comments: Ill-appearing, not responding to verbal command.  Opens eye only  HENT:     Head: Normocephalic and atraumatic.     Nose: Nose normal.  Eyes:     Conjunctiva/sclera: Conjunctivae normal.     Pupils: Pupils are equal, round, and reactive to light.  Cardiovascular:     Rate and Rhythm: Normal rate and regular rhythm.     Heart sounds: No murmur heard. Pulmonary:     Effort: Pulmonary effort is normal. No respiratory distress.     Breath sounds: Normal breath sounds.  Abdominal:     General: Abdomen is flat. Bowel sounds are normal.     Tenderness: There is no abdominal tenderness.  Musculoskeletal:        General: No swelling. Normal range of motion.     Cervical back: Normal range of motion and neck supple.     Right lower leg: No edema.     Left lower leg: No edema.  Skin:    General: Skin is warm and dry.     Coloration: Skin is not jaundiced.  Neurological:     Comments: Patient is unresponsive.     Data Reviewed:  Reviewed CT scan and lab results.  Assessment and Plan: Failure to thrive. Recent subdural hematoma. Advanced dementia with delirium. Patient condition has been deteriorating, mental status much worsened after recent subdural hematoma.  Patient has not been eating and drinking, sleeping 18 hours a day.  Patient is actively dying.  Transition to comfort care.  Hypernatremia. Mild  metabolic acidosis Due to poor p.o. intake   Advance Care Planning:   Code Status: Do not attempt resuscitation (DNR) - Comfort care as discussed above.  Consults: None  Family Communication: Wife, son, updated at the bedside.  Severity of Illness: The appropriate patient status for this patient is INPATIENT. Inpatient status is judged to be reasonable and necessary in order to provide the required intensity of service to ensure the patient's safety. The patient's presenting symptoms, physical exam findings, and initial radiographic and laboratory data in the context of their chronic comorbidities is felt to place them at high risk for further clinical deterioration. Furthermore, it is not anticipated that the patient will be medically stable for discharge from the hospital within 2 midnights of admission.   * I certify that at the point of admission it is my clinical judgment that the patient will require inpatient hospital care spanning beyond 2 midnights from the point of admission due to high intensity of service, high risk for further deterioration and high frequency of surveillance required.*  Author: Murvin Mana, MD 12/26/2024 11:40 AM  For on call review [email or web address].     [1]  Allergies Allergen Reactions   Codeine Nausea And Vomiting   "

## 2024-12-31 LAB — CULTURE, BLOOD (ROUTINE X 2)
Culture: NO GROWTH
Culture: NO GROWTH

## 2025-01-07 ENCOUNTER — Inpatient Hospital Stay

## 2025-01-17 NOTE — Plan of Care (Signed)
 Education and plan of care provided to pt's son.  Problem: Education: Goal: Knowledge of the prescribed therapeutic regimen will improve Outcome: Progressing   Problem: Coping: Goal: Ability to identify and develop effective coping behavior will improve Outcome: Progressing   Problem: Clinical Measurements: Goal: Quality of life will improve Outcome: Progressing   Problem: Respiratory: Goal: Verbalizations of increased ease of respirations will increase Outcome: Progressing   Problem: Role Relationship: Goal: Family's ability to cope with current situation will improve Outcome: Progressing Goal: Ability to verbalize concerns, feelings, and thoughts to partner or family member will improve Outcome: Progressing   Problem: Pain Management: Goal: Satisfaction with pain management regimen will improve Outcome: Progressing   Problem: Education: Goal: Knowledge of General Education information will improve Description: Including pain rating scale, medication(s)/side effects and non-pharmacologic comfort measures Outcome: Progressing   Problem: Health Behavior/Discharge Planning: Goal: Ability to manage health-related needs will improve Outcome: Progressing   Problem: Clinical Measurements: Goal: Ability to maintain clinical measurements within normal limits will improve Outcome: Progressing Goal: Will remain free from infection Outcome: Progressing Goal: Diagnostic test results will improve Outcome: Progressing Goal: Respiratory complications will improve Outcome: Progressing Goal: Cardiovascular complication will be avoided Outcome: Progressing   Problem: Activity: Goal: Risk for activity intolerance will decrease Outcome: Progressing   Problem: Nutrition: Goal: Adequate nutrition will be maintained Outcome: Progressing   Problem: Coping: Goal: Level of anxiety will decrease Outcome: Progressing   Problem: Elimination: Goal: Will not experience complications  related to bowel motility Outcome: Progressing Goal: Will not experience complications related to urinary retention Outcome: Progressing   Problem: Pain Managment: Goal: General experience of comfort will improve and/or be controlled Outcome: Progressing   Problem: Safety: Goal: Ability to remain free from injury will improve Outcome: Progressing   Problem: Skin Integrity: Goal: Risk for impaired skin integrity will decrease Outcome: Progressing

## 2025-01-17 NOTE — Death Summary Note (Signed)
 "  DEATH SUMMARY   Patient Details  Name: Albert [name] MRN: [id number] DOB: Feb 27, 1946 ERE:Albert [name] ORN, MD Admission/Discharge Information   Admit Date:  01-03-25  Date of Death: Date of Death: 2025-01-04  Time of Death: Time of Death: 1259  Length of Stay: 1   Principle Cause of death: Advanced dementia with delirium.  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 is a 79 y.o. male with medical history significant of advanced dementia, essential hypertension, history of stroke, who presents to the hospital with failure to thrive and increased confusion.  Upon arriving to hospital, patient has not been responsive.  He has a significant discomfort, after discussion with the family, patient will transition to comfort care. Patient died in peace today. I saw the patient and examined him earlier this morning.  At that time, patient was unresponsive, heart was regular, respiration has been slowed, with decreased breath sounds.         Procedures: None  Consultations: None  The results of significant diagnostics from this hospitalization (including imaging, microbiology, ancillary and laboratory) are listed below for reference.   Significant Diagnostic Studies: CT HEAD WO CONTRAST ( ) Result Date: 2025/01/03 EXAM: CT HEAD WITHOUT CONTRAST 03-Jan-2025 10:08:00 AM TECHNIQUE: CT of the head was performed without the administration of intravenous contrast. Automated exposure control, iterative reconstruction, and/or weight based adjustment of the mA/kV was utilized to reduce the radiation dose to as low as reasonably achievable. COMPARISON: Head CT 12/21/2024 and earlier. CLINICAL HISTORY: 79 year old male. Altered mental status, recent admission for subdural hematoma. Subdural hematoma  diagnosed earlier this month. FINDINGS: BRAIN AND VENTRICLES: Mixed density left superior and lateral convexity subdural hematoma appears decreased by 1 to 2 mm since 12/21/2024, maximal thickness now 9 to 11 mm. No progressive hyperdense blood products. Mild mass effect on the left hemisphere. No significant midline shift (coronal image 20). Subtle mass effect on the left lateral ventricle. No ventriculomegaly. Basilar cisterns remain patent. Stable gray white differentiation. Chronic small vessel disease. No new intracranial hemorrhage. No suspicious intracranial vascular hyperdensity. No evidence of acute infarct. No extra-axial collection other than the described subdural hematoma. ORBITS: No acute abnormality. SINUSES: Right mastoid effusion is stable recently, new from last year. Right tympanic cavity remains aerated. Sphenoid sinus bubbly opacity is stable. Other paranasal sinuses remain well aerated. Left middle ear and mastoid clear. Negative visible nasopharynx. SOFT TISSUES AND SKULL: No acute soft tissue abnormality. No skull fracture. Mild calcified atherosclerosis at the skull base. IMPRESSION: 1. Mixed-density left subdural hematoma is 1-2 mm smaller (now 9-11 mm). Mild mass effect, no significant midline shift. 2. No new intracranial abnormality. Chronic small vessel disease. Electronically signed by: Helayne Hurst MD MD 01-03-25 10:17 AM EST RP Workstation: [id number]   DG Chest Port 1 View Result Date: 2025/01/03 CLINICAL DATA:  Sepsis EXAM: PORTABLE CHEST 1 VIEW COMPARISON:  10/30/2024 FINDINGS: Feeding tube and right IJ central line are no longer evident. The lungs are clear without focal pneumonia, edema, pneumothorax or pleural effusion. Cardiopericardial silhouette is at upper limits of normal for size. Minimal streaky density in the retrocardiac left base may be atelectasis although infiltrate not excluded. Bones are diffusely demineralized. Lucency overlies the right supraclavicular  region and shoulder raising the question of soft tissue gas although this could potentially be related to  artifact from superimposed clothing. IMPRESSION: 1. Minimal streaky density in the retrocardiac left base may be atelectasis although infiltrate not excluded. 2. Lucency overlying the right supraclavicular region and shoulder raising the question of soft tissue gas although this could potentially be related to artifact from superimposed clothing. Clinical correlation recommended. Electronically Signed   By: Camellia Candle M.D.   On: 12/26/2024 09:14   CT HEAD WO CONTRAST ( ) Result Date: 12/21/2024 EXAM: CT HEAD WITHOUT CONTRAST 12/21/2024 05:45:00 PM TECHNIQUE: CT of the head was performed without the administration of intravenous contrast. Automated exposure control, iterative reconstruction, and/or weight based adjustment of the mA/kV was utilized to reduce the radiation dose to as low as reasonably achievable. COMPARISON: Head CT 12/19/2024. CLINICAL HISTORY: Subdural hematoma; Follow-up of recent subdural hematoma. FINDINGS: BRAIN AND VENTRICLES: A mixed density subdural hematoma over the left cerebral convexity has not significantly changed in size, measuring up to 12 mm in thickness. Mild associated mass effect including 4 mm of rightward midline shift is unchanged. No new intracranial hemorrhage, acute infarct, or hydrocephalus is evident. There is mild global cerebral atrophy as well as more prominent bilateral mesial temporal web volume loss. Patchy hypodensities in the cerebral white matter bilaterally are unchanged and nonspecific but compatible with moderate chronic small vessel ischemic disease. A chronic lacunar infarct is again noted in the right thalamus. Calcified atherosclerosis at the skull base. ORBITS: No acute abnormality. SINUSES: Persistent moderately large right mastoid effusion. New small volume fluid in the left sphenoid sinus. SOFT TISSUES AND SKULL: No acute soft tissue  abnormality. No skull fracture. IMPRESSION: 1. Unchanged mixed density subdural hematoma over the left cerebral convexity with 4 mm of rightward midline shift. 2. No new intracranial abnormality. 3. Moderate chronic small vessel ischemic disease. 4. Right mastoid effusion and left sphenoid sinus fluid. Electronically signed by: Dasie Hamburg MD 12/21/2024 06:26 PM EST RP Workstation: [id number]   CT HEAD WO CONTRAST Result Date: 12/19/2024 CLINICAL DATA:  Initial evaluation for acute altered mental status. EXAM: CT HEAD WITHOUT CONTRAST TECHNIQUE: Contiguous axial images were obtained from the base of the skull through the vertex without intravenous contrast. RADIATION DOSE REDUCTION: This exam was performed according to the departmental dose-optimization program which includes automated exposure control, adjustment of the mA and/or kV according to patient size and/or use of iterative reconstruction technique. COMPARISON:  Prior study from 06/24/2024. FINDINGS: Brain: Age-related cerebral atrophy with moderate chronic microvascular ischemic disease. Remote right thalamic lacunar infarct. No acute large vessel territory infarct. Mixed density subdural hematoma overlying the left cerebral convexity measures up to 1.2 cm in maximal thickness, consistent with a subacute subdural hematoma. Mass effect on the subjacent left cerebral hemisphere with 4 mm left-to-right shift. No hydrocephalus or trapping. Vascular: No abnormal hyperdense vessel. Calcified atherosclerosis present at skull base. Skull: Scalp soft tissues demonstrate no acute finding. Calvarium intact. Sinuses/Orbits: Globes and orbital soft tissues within normal limits. Mild mucosal thickening about the sphenoid ethmoidal sinuses. Moderate right mastoid effusion. Left mastoid air cells are clear. Other: None. IMPRESSION: 1. Subacute subdural hematoma overlying the left cerebral convexity measuring up to 1.2 cm in maximal thickness. Mass effect on the  subjacent left cerebral hemisphere with 4 mm left-to-right shift. No hydrocephalus or trapping. 2. Age-related cerebral atrophy with moderate chronic microvascular ischemic disease, with remote right thalamic lacunar infarct. Critical Value/emergent results were called by telephone at the time of interpretation on 12/19/2024 at 8:44 pm to provider [facility] , who verbally acknowledged these results. Electronically Signed  By: Morene Hoard M.D.   On: 12/19/2024 20:45    Microbiology: Recent Results (from the past 240 hours)  Blood Culture (routine x 2)     Status: None (Preliminary result)   Collection Time: 12/26/24  8:49 AM   Specimen: BLOOD  Result Value Ref Range Status   Specimen Description BLOOD LEFT ANTECUBITAL  Final   Special Requests   Final    BOTTLES DRAWN AEROBIC AND ANAEROBIC Blood Culture results may not be optimal due to an inadequate volume of blood received in culture bottles   Culture   Final    NO GROWTH < 24 HOURS Performed at [facility], [address] [place], [place] [address]    Report Status PENDING  Incomplete  Blood Culture (routine x 2)     Status: None (Preliminary result)   Collection Time: 12/26/24  9:18 AM   Specimen: BLOOD  Result Value Ref Range Status   Specimen Description BLOOD BLOOD LEFT ARM  Final   Special Requests   Final    BOTTLES DRAWN AEROBIC AND ANAEROBIC Blood Culture results may not be optimal due to an inadequate volume of blood received in culture bottles   Culture   Final    NO GROWTH < 24 HOURS Performed at [facility], [address], [place], [place] [address]    Report Status PENDING  Incomplete  Resp panel by RT-PCR (RSV, Flu A&B, Covid) In/Out Cath Urine     Status: None   Collection Time: 12/26/24  9:20 AM   Specimen: In/Out Cath Urine; Nasal Swab  Result Value Ref Range Status   SARS Coronavirus 2 by RT PCR NEGATIVE NEGATIVE Final    Comment: (NOTE) SARS-CoV-2 target nucleic acids are NOT  DETECTED.  The SARS-CoV-2 RNA is generally detectable in upper respiratory specimens during the acute phase of infection. The lowest concentration of SARS-CoV-2 viral copies this assay can detect is 138 copies/mL. A negative result does not preclude SARS-Cov-2 infection and should not be used as the sole basis for treatment or other patient management decisions. A negative result may occur with  improper specimen collection/handling, submission of specimen other than nasopharyngeal swab, presence of viral mutation(s) within the areas targeted by this assay, and inadequate number of viral copies(<138 copies/mL). A negative result must be combined with clinical observations, patient history, and epidemiological information. The expected result is Negative.  Fact Sheet for Patients:  bloggercourse.com  Fact Sheet for Healthcare Providers:  seriousbroker.it  This test is no t yet approved or cleared by the [place]  FDA and  has been authorized for detection and/or diagnosis of SARS-CoV-2 by FDA under an Emergency Use Authorization (EUA). This EUA will remain  in effect (meaning this test can be used) for the duration of the COVID-19 declaration under Section 564(b)(1) of the Act, 21 U.S.C.section 360bbb-3(b)(1), unless the authorization is terminated  or revoked sooner.       Influenza A by PCR NEGATIVE NEGATIVE Final   Influenza B by PCR NEGATIVE NEGATIVE Final    Comment: (NOTE) The Xpert Xpress SARS-CoV-2/FLU/RSV plus assay is intended as an aid in the diagnosis of influenza from Nasopharyngeal swab specimens and should not be used as a sole basis for treatment. Nasal washings and aspirates are unacceptable for Xpert Xpress SARS-CoV-2/FLU/RSV testing.  Fact Sheet for Patients: bloggercourse.com  Fact Sheet for Healthcare Providers: seriousbroker.it  This test is not yet  approved or cleared by the [place]  FDA and has been authorized for  detection and/or diagnosis of SARS-CoV-2 by FDA under an Emergency Use Authorization (EUA). This EUA will remain in effect (meaning this test can be used) for the duration of the COVID-19 declaration under Section 564(b)(1) of the Act, 21 U.S.C. section 360bbb-3(b)(1), unless the authorization is terminated or revoked.     Resp Syncytial Virus by PCR NEGATIVE NEGATIVE Final    Comment: (NOTE) Fact Sheet for Patients: bloggercourse.com  Fact Sheet for Healthcare Providers: seriousbroker.it  This test is not yet approved or cleared by the [place]  FDA and has been authorized for detection and/or diagnosis of SARS-CoV-2 by FDA under an Emergency Use Authorization (EUA). This EUA will remain in effect (meaning this test can be used) for the duration of the COVID-19 declaration under Section 564(b)(1) of the Act, 21 U.S.C. section 360bbb-3(b)(1), unless the authorization is terminated or revoked.  Performed at [facility], [address], [place], [place] [address]     Time spent: 35 minutes  Signed: Murvin Mana, MD 01/16/25   "

## 2025-01-17 DEATH — deceased

## 2025-01-18 ENCOUNTER — Ambulatory Visit: Admitting: Neurosurgery
